# Patient Record
Sex: Male | Born: 1955 | Race: White | Hispanic: No | Marital: Married | Smoking: Former smoker
Health system: Southern US, Community
[De-identification: ages and names within clinical notes are randomized; demographics above are authoritative.]

## PROBLEM LIST (undated history)

## (undated) DIAGNOSIS — K602 Anal fissure, unspecified: Secondary | ICD-10-CM

## (undated) DIAGNOSIS — T7840XA Allergy, unspecified, initial encounter: Secondary | ICD-10-CM

## (undated) DIAGNOSIS — J42 Unspecified chronic bronchitis: Secondary | ICD-10-CM

## (undated) DIAGNOSIS — Z9289 Personal history of other medical treatment: Secondary | ICD-10-CM

## (undated) DIAGNOSIS — Z9989 Dependence on other enabling machines and devices: Secondary | ICD-10-CM

## (undated) DIAGNOSIS — I480 Paroxysmal atrial fibrillation: Secondary | ICD-10-CM

## (undated) DIAGNOSIS — IMO0002 Reserved for concepts with insufficient information to code with codable children: Secondary | ICD-10-CM

## (undated) DIAGNOSIS — E785 Hyperlipidemia, unspecified: Secondary | ICD-10-CM

## (undated) DIAGNOSIS — E1140 Type 2 diabetes mellitus with diabetic neuropathy, unspecified: Secondary | ICD-10-CM

## (undated) DIAGNOSIS — K648 Other hemorrhoids: Secondary | ICD-10-CM

## (undated) DIAGNOSIS — E669 Obesity, unspecified: Secondary | ICD-10-CM

## (undated) DIAGNOSIS — I10 Essential (primary) hypertension: Secondary | ICD-10-CM

## (undated) DIAGNOSIS — R202 Paresthesia of skin: Secondary | ICD-10-CM

## (undated) DIAGNOSIS — G4730 Sleep apnea, unspecified: Secondary | ICD-10-CM

## (undated) DIAGNOSIS — I4892 Unspecified atrial flutter: Secondary | ICD-10-CM

## (undated) DIAGNOSIS — N529 Male erectile dysfunction, unspecified: Secondary | ICD-10-CM

## (undated) DIAGNOSIS — F17200 Nicotine dependence, unspecified, uncomplicated: Secondary | ICD-10-CM

## (undated) DIAGNOSIS — G4733 Obstructive sleep apnea (adult) (pediatric): Secondary | ICD-10-CM

## (undated) DIAGNOSIS — E1165 Type 2 diabetes mellitus with hyperglycemia: Secondary | ICD-10-CM

## (undated) DIAGNOSIS — I2510 Atherosclerotic heart disease of native coronary artery without angina pectoris: Secondary | ICD-10-CM

## (undated) HISTORY — DX: Obesity, unspecified: E66.9

## (undated) HISTORY — DX: Sleep apnea, unspecified: G47.30

## (undated) HISTORY — DX: Other hemorrhoids: K64.8

## (undated) HISTORY — DX: Type 2 diabetes mellitus with diabetic neuropathy, unspecified: E11.40

## (undated) HISTORY — DX: Allergy, unspecified, initial encounter: T78.40XA

## (undated) HISTORY — DX: Essential (primary) hypertension: I10

## (undated) HISTORY — PX: ANAL FISSURE REPAIR: SHX2312

## (undated) HISTORY — DX: Unspecified atrial flutter: I48.92

## (undated) HISTORY — DX: Reserved for concepts with insufficient information to code with codable children: IMO0002

## (undated) HISTORY — DX: Paroxysmal atrial fibrillation: I48.0

## (undated) HISTORY — DX: Hyperlipidemia, unspecified: E78.5

## (undated) HISTORY — DX: Anal fissure, unspecified: K60.2

## (undated) HISTORY — DX: Type 2 diabetes mellitus with hyperglycemia: E11.65

## (undated) HISTORY — DX: Male erectile dysfunction, unspecified: N52.9

## (undated) HISTORY — DX: Nicotine dependence, unspecified, uncomplicated: F17.200

## (undated) HISTORY — DX: Personal history of other medical treatment: Z92.89

## (undated) HISTORY — DX: Paresthesia of skin: R20.2

---
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
## 1977-08-24 HISTORY — PX: TONSILLECTOMY AND ADENOIDECTOMY: SUR1326

## 1993-08-24 HISTORY — PX: CORONARY ANGIOPLASTY WITH STENT PLACEMENT: SHX49

## 1994-08-24 HISTORY — PX: CORONARY ANGIOPLASTY WITH STENT PLACEMENT: SHX49

## 2000-10-23 ENCOUNTER — Emergency Department (HOSPITAL_COMMUNITY): Admission: EM | Admit: 2000-10-23 | Discharge: 2000-10-23 | Payer: Self-pay | Admitting: Emergency Medicine

## 2000-11-04 ENCOUNTER — Encounter (HOSPITAL_BASED_OUTPATIENT_CLINIC_OR_DEPARTMENT_OTHER): Payer: Self-pay | Admitting: General Surgery

## 2000-11-04 ENCOUNTER — Encounter: Admission: RE | Admit: 2000-11-04 | Discharge: 2000-11-04 | Payer: Self-pay | Admitting: General Surgery

## 2000-11-18 ENCOUNTER — Encounter: Payer: Self-pay | Admitting: Family Medicine

## 2000-11-18 ENCOUNTER — Encounter: Admission: RE | Admit: 2000-11-18 | Discharge: 2000-11-18 | Payer: Self-pay | Admitting: Family Medicine

## 2002-09-20 ENCOUNTER — Ambulatory Visit (HOSPITAL_BASED_OUTPATIENT_CLINIC_OR_DEPARTMENT_OTHER): Admission: RE | Admit: 2002-09-20 | Discharge: 2002-09-20 | Payer: Self-pay | Admitting: *Deleted

## 2003-12-20 ENCOUNTER — Inpatient Hospital Stay (HOSPITAL_BASED_OUTPATIENT_CLINIC_OR_DEPARTMENT_OTHER): Admission: RE | Admit: 2003-12-20 | Discharge: 2003-12-20 | Payer: Self-pay | Admitting: Interventional Cardiology

## 2004-05-22 ENCOUNTER — Ambulatory Visit (HOSPITAL_BASED_OUTPATIENT_CLINIC_OR_DEPARTMENT_OTHER): Admission: RE | Admit: 2004-05-22 | Discharge: 2004-05-22 | Payer: Self-pay | Admitting: Family Medicine

## 2005-04-10 ENCOUNTER — Ambulatory Visit (HOSPITAL_COMMUNITY): Admission: RE | Admit: 2005-04-10 | Discharge: 2005-04-10 | Payer: Self-pay | Admitting: Otolaryngology

## 2005-08-24 HISTORY — PX: CARDIAC CATHETERIZATION: SHX172

## 2006-01-30 ENCOUNTER — Inpatient Hospital Stay (HOSPITAL_COMMUNITY): Admission: EM | Admit: 2006-01-30 | Discharge: 2006-02-01 | Payer: Self-pay | Admitting: Emergency Medicine

## 2007-05-22 ENCOUNTER — Ambulatory Visit (HOSPITAL_BASED_OUTPATIENT_CLINIC_OR_DEPARTMENT_OTHER): Admission: RE | Admit: 2007-05-22 | Discharge: 2007-05-22 | Payer: Self-pay | Admitting: Family Medicine

## 2007-05-24 ENCOUNTER — Ambulatory Visit: Payer: Self-pay | Admitting: Internal Medicine

## 2007-10-13 ENCOUNTER — Encounter (INDEPENDENT_AMBULATORY_CARE_PROVIDER_SITE_OTHER): Payer: Self-pay | Admitting: General Surgery

## 2007-10-13 ENCOUNTER — Ambulatory Visit (HOSPITAL_BASED_OUTPATIENT_CLINIC_OR_DEPARTMENT_OTHER): Admission: RE | Admit: 2007-10-13 | Discharge: 2007-10-13 | Payer: Self-pay | Admitting: General Surgery

## 2008-11-01 ENCOUNTER — Ambulatory Visit: Payer: Self-pay | Admitting: Family Medicine

## 2008-11-01 DIAGNOSIS — T887XXA Unspecified adverse effect of drug or medicament, initial encounter: Secondary | ICD-10-CM | POA: Insufficient documentation

## 2008-11-01 DIAGNOSIS — E785 Hyperlipidemia, unspecified: Secondary | ICD-10-CM | POA: Insufficient documentation

## 2008-11-01 LAB — CONVERTED CEMR LAB
Bilirubin Urine: NEGATIVE
Blood in Urine, dipstick: NEGATIVE
Glucose, Urine, Semiquant: NEGATIVE
Ketones, urine, test strip: NEGATIVE
Nitrite: NEGATIVE
Protein, U semiquant: NEGATIVE
Specific Gravity, Urine: 1.02
Urobilinogen, UA: 0.2
WBC Urine, dipstick: NEGATIVE
pH: 7

## 2008-11-05 LAB — CONVERTED CEMR LAB
ALT: 31 units/L (ref 0–53)
AST: 21 units/L (ref 0–37)
Albumin: 3.8 g/dL (ref 3.5–5.2)
Alkaline Phosphatase: 82 units/L (ref 39–117)
BUN: 10 mg/dL (ref 6–23)
Basophils Absolute: 0 10*3/uL (ref 0.0–0.1)
Basophils Relative: 0.4 % (ref 0.0–3.0)
Bilirubin, Direct: 0.1 mg/dL (ref 0.0–0.3)
CO2: 28 meq/L (ref 19–32)
Calcium: 8.7 mg/dL (ref 8.4–10.5)
Chloride: 107 meq/L (ref 96–112)
Cholesterol: 114 mg/dL (ref 0–200)
Creatinine, Ser: 0.9 mg/dL (ref 0.4–1.5)
Eosinophils Absolute: 0.2 10*3/uL (ref 0.0–0.7)
Eosinophils Relative: 2.7 % (ref 0.0–5.0)
GFR calc Af Amer: 114 mL/min
GFR calc non Af Amer: 95 mL/min
Glucose, Bld: 95 mg/dL (ref 70–99)
HCT: 44.2 % (ref 39.0–52.0)
HDL: 28.2 mg/dL — ABNORMAL LOW (ref >39.0)
Hemoglobin: 15.4 g/dL (ref 13.0–17.0)
LDL Cholesterol: 46 mg/dL (ref 0–99)
Lymphocytes Relative: 25.7 % (ref 12.0–46.0)
MCHC: 34.9 g/dL (ref 30.0–36.0)
MCV: 90.6 fL (ref 78.0–100.0)
Monocytes Absolute: 0.8 10*3/uL (ref 0.1–1.0)
Monocytes Relative: 9.7 % (ref 3.0–12.0)
Neutro Abs: 5.5 10*3/uL (ref 1.4–7.7)
Neutrophils Relative %: 61.5 % (ref 43.0–77.0)
PSA: 0.36 ng/mL (ref 0.10–4.00)
Platelets: 217 10*3/uL (ref 150–400)
Potassium: 4 meq/L (ref 3.5–5.1)
RBC: 4.88 M/uL (ref 4.22–5.81)
RDW: 12.1 % (ref 11.5–14.6)
Sodium: 141 meq/L (ref 135–145)
TSH: 1 microintl units/mL (ref 0.35–5.50)
Total Bilirubin: 0.9 mg/dL (ref 0.3–1.2)
Total CHOL/HDL Ratio: 4
Total Protein: 6.6 g/dL (ref 6.0–8.3)
Triglycerides: 198 mg/dL — ABNORMAL HIGH (ref 0–149)
VLDL: 40 mg/dL (ref 0–40)
WBC: 8.7 10*3/uL (ref 4.5–10.5)

## 2008-11-08 ENCOUNTER — Ambulatory Visit: Payer: Self-pay | Admitting: Family Medicine

## 2008-11-08 DIAGNOSIS — G4733 Obstructive sleep apnea (adult) (pediatric): Secondary | ICD-10-CM | POA: Insufficient documentation

## 2008-11-08 DIAGNOSIS — J45909 Unspecified asthma, uncomplicated: Secondary | ICD-10-CM | POA: Insufficient documentation

## 2008-11-08 DIAGNOSIS — J309 Allergic rhinitis, unspecified: Secondary | ICD-10-CM | POA: Insufficient documentation

## 2009-03-08 ENCOUNTER — Ambulatory Visit: Payer: Self-pay | Admitting: Family Medicine

## 2009-03-08 DIAGNOSIS — J019 Acute sinusitis, unspecified: Secondary | ICD-10-CM | POA: Insufficient documentation

## 2009-03-09 ENCOUNTER — Emergency Department (HOSPITAL_COMMUNITY): Admission: EM | Admit: 2009-03-09 | Discharge: 2009-03-09 | Payer: Self-pay | Admitting: Emergency Medicine

## 2009-03-18 ENCOUNTER — Ambulatory Visit: Payer: Self-pay | Admitting: Family Medicine

## 2009-03-18 DIAGNOSIS — B370 Candidal stomatitis: Secondary | ICD-10-CM | POA: Insufficient documentation

## 2009-05-13 ENCOUNTER — Inpatient Hospital Stay (HOSPITAL_COMMUNITY): Admission: EM | Admit: 2009-05-13 | Discharge: 2009-05-14 | Payer: Self-pay | Admitting: Emergency Medicine

## 2009-08-13 ENCOUNTER — Ambulatory Visit: Payer: Self-pay | Admitting: Family Medicine

## 2009-09-02 ENCOUNTER — Ambulatory Visit: Payer: Self-pay | Admitting: Family Medicine

## 2009-09-02 DIAGNOSIS — L909 Atrophic disorder of skin, unspecified: Secondary | ICD-10-CM | POA: Insufficient documentation

## 2009-09-02 DIAGNOSIS — L919 Hypertrophic disorder of the skin, unspecified: Secondary | ICD-10-CM

## 2009-09-09 ENCOUNTER — Telehealth: Payer: Self-pay | Admitting: Family Medicine

## 2009-09-10 ENCOUNTER — Ambulatory Visit: Payer: Self-pay | Admitting: Family Medicine

## 2009-09-16 ENCOUNTER — Ambulatory Visit: Payer: Self-pay | Admitting: Family Medicine

## 2009-09-27 ENCOUNTER — Ambulatory Visit: Payer: Self-pay | Admitting: Family Medicine

## 2009-10-07 ENCOUNTER — Ambulatory Visit: Payer: Self-pay | Admitting: Family Medicine

## 2009-11-01 ENCOUNTER — Ambulatory Visit: Payer: Self-pay | Admitting: Family Medicine

## 2009-11-01 DIAGNOSIS — R509 Fever, unspecified: Secondary | ICD-10-CM | POA: Insufficient documentation

## 2009-11-02 LAB — CONVERTED CEMR LAB
Basophils Absolute: 0 10*3/uL (ref 0.0–0.1)
Basophils Relative: 0 % (ref 0–1)
Eosinophils Absolute: 0.2 10*3/uL (ref 0.0–0.7)
Eosinophils Relative: 2 % (ref 0–5)
HCT: 46.4 % (ref 39.0–52.0)
Hemoglobin: 16.2 g/dL (ref 13.0–17.0)
Lymphocytes Relative: 5 % — ABNORMAL LOW (ref 12–46)
Lymphs Abs: 0.5 10*3/uL — ABNORMAL LOW (ref 0.7–4.0)
MCHC: 34.9 g/dL (ref 30.0–36.0)
MCV: 87.4 fL (ref 78.0–100.0)
Monocytes Absolute: 0.8 10*3/uL (ref 0.1–1.0)
Monocytes Relative: 8 % (ref 3–12)
Neutro Abs: 7.8 10*3/uL — ABNORMAL HIGH (ref 1.7–7.7)
Neutrophils Relative %: 84 % — ABNORMAL HIGH (ref 43–77)
Platelets: 186 10*3/uL (ref 150–400)
RBC: 5.31 M/uL (ref 4.22–5.81)
RDW: 12.8 % (ref 11.5–15.5)
WBC: 9.3 10*3/uL (ref 4.0–10.5)

## 2009-11-06 ENCOUNTER — Ambulatory Visit: Payer: Self-pay | Admitting: Family Medicine

## 2009-11-06 DIAGNOSIS — R197 Diarrhea, unspecified: Secondary | ICD-10-CM | POA: Insufficient documentation

## 2009-11-08 ENCOUNTER — Telehealth: Payer: Self-pay | Admitting: Family Medicine

## 2010-04-17 ENCOUNTER — Ambulatory Visit: Payer: Self-pay | Admitting: Family Medicine

## 2010-08-01 ENCOUNTER — Ambulatory Visit: Payer: Self-pay | Admitting: Family Medicine

## 2010-09-14 ENCOUNTER — Encounter: Payer: Self-pay | Admitting: Gastroenterology

## 2010-09-23 NOTE — Assessment & Plan Note (Signed)
Summary: ? sinuses/ccm   Vital Signs:  Patient profile:   55 year old male Weight:      288 pounds Temp:     98.3 degrees F oral Pulse rate:   98 / minute BP sitting:   122 / 82  (left arm) Cuff size:   large  Vitals Entered By: Army Fossa CMA (March 08, 2009 3:47 PM)  History of Present Illness: Patient seen with one week history of pain right frontal sinus region radiating behind the eyes. Had headaches. No fever. Mild nasal congestion occasional nonproductive cough. Advil helps headaches temporarily. He has not had any blurred vision. He has history of seasonal and perennial allergies and takes Zyrtec. Allergic to cephalosporins and penicillin.  Allergies (verified): 1)  ! Codeine 2)  ! Cephalosporins 3)  ! * Cillin Drugs  Past History:  Past Medical History: Last updated: 11/08/2008 Allergic rhinitis Asthma Coronary artery disease Hyperlipidemia  Review of Systems      See HPI  Physical Exam  General:  Well-developed,well-nourished,in no acute distress; alert,appropriate and cooperative throughout examination Head:  Normocephalic and atraumatic without obvious abnormalities. No apparent alopecia or balding.patient has some mild tenderness over the right frontal sinus region Eyes:  No corneal or conjunctival inflammation noted. EOMI. Perrla. Funduscopic exam benign, without hemorrhages, exudates or papilledema. Vision grossly normal. Ears:  External ear exam shows no significant lesions or deformities.  Otoscopic examination reveals clear canals, tympanic membranes are intact bilaterally without bulging, retraction, inflammation or discharge. Hearing is grossly normal bilaterally. Nose:  mild mucosal erythema but no purulent nasal discharge noted. Mouth:  Oral mucosa and oropharynx without lesions or exudates.  Teeth in good repair. Neck:  No deformities, masses, or tenderness noted. Lungs:  Normal respiratory effort, chest expands symmetrically. Lungs are clear to  auscultation, no crackles or wheezes.   Impression & Recommendations:  Problem # 1:  ACUTE SINUSITIS, UNSPECIFIED (ICD-461.9)  His updated medication list for this problem includes:    Levaquin 500 Mg Tabs (Levofloxacin) ..... One by mouth once daily for 10 days  Complete Medication List: 1)  Advair Diskus 100-50 Mcg/dose Misc (Fluticasone-salmeterol) .[name].. 1 puff q 12 hours 2)  Zyrtec Allergy 10 Mg Tabs (Cetirizine hcl) .... Once daily 3)  Levaquin 500 Mg Tabs (Levofloxacin) .... One by mouth once daily for 10 days  Patient Instructions: 1)  Drink plenty of fluids. Be in touch next week if symptoms not improving. Prescriptions: LEVAQUIN 500 MG TABS (LEVOFLOXACIN) one by mouth once daily for 10 days  #10 x 0   Entered and Authorized by:   Evelena Peat MD   Signed by:   Evelena Peat MD on 03/08/2009   Method used:   Electronically to        [facility]* (retail)       [address] [place], [place]  [address]       Ph: [phone] or [phone]       Fax: [phone]   RxID:   [id number]

## 2010-09-23 NOTE — Assessment & Plan Note (Signed)
Summary: FUP//CCM   Vital Signs:  Patient profile:   55 year old male Temp:     99.2 degrees F oral BP sitting:   120 / 88  (left arm) Cuff size:   large  Vitals Entered By: Sid Falcon LPN (October 07, 2009 4:35 PM) CC: follow-up visit   History of Present Illness: Here for reassessment multiple skin tag. Right groin region improved. Has several left groin which would like to now have treated. These are bothersome because of location.  Sinus sxs improved.  Allergies: 1)  ! Codeine 2)  ! Cephalosporins 3)  ! * Cillin Drugs  Past History:  Past Medical History: Last updated: 11/08/2008 Allergic rhinitis Asthma Coronary artery disease Hyperlipidemia PMH reviewed for relevance  Review of Systems      See HPI  Physical Exam  General:  Well-developed,well-nourished,in no acute distress; alert,appropriate and cooperative throughout examination Skin:  multiple skin tags left groin region. A few scattered skin tags right groin which is not fully gone away after previous treatment   Impression & Recommendations:  Problem # 1:  SKIN TAG (ICD-701.9)  reviewed risk and benefits of treatment with cryotherapy and patient wished to proceed. He had numerous skin tags left groin region numbering over 30 and 5 right groin which were retreated. Tolerated treatment well  Orders: Cryotherapy/Destruction benign or premalignant lesion (1st lesion)  (17000) Cryotherapy/Destruction benign or premalignant lesion (2nd-14th lesions) (17003)  Complete Medication List: 1)  Advair Diskus 100-50 Mcg/dose Misc (Fluticasone-salmeterol) .[name].. 1 puff q 12 hours 2)  Zyrtec Allergy 10 Mg Tabs (Cetirizine hcl) .... Once daily as needed 3)  Lipitor 40 Mg Tabs (Atorvastatin calcium) .... Once daily 4)  Aspirin 325 Mg Tabs (Aspirin) .... Once daily

## 2010-09-23 NOTE — Progress Notes (Signed)
Summary: flagyl  Phone Note Call from Patient Call back at Work Phone [phone]   Reason for Call: Talk to Doctor Summary of Call: Re med he has me on & whether I should continue to take it.  Flagyl, since C diff was not positive.  Not well, still have nausea & have diarrhea.  No problem taking it & it wipe something else out.  Eating yogurt daily.  Please call with response about Flagyl. Initial call taken by: Rudy Jew, RN,  November 08, 2009 9:13 AM  Follow-up for Phone Call        If no side effects would continue Flagyl until complete. Follow-up by: Evelena Peat MD,  November 08, 2009 10:38 AM  Additional Follow-up for Phone Call Additional follow up Details #1::        Phone Call Completed Additional Follow-up by: Rudy Jew, RN,  November 08, 2009 11:33 AM

## 2010-09-23 NOTE — Assessment & Plan Note (Signed)
Summary: skin tag treated/njr   Vital Signs:  Patient profile:   55 year old male O2 Sat:      97 % on Room air Temp:     98 degrees F oral Pulse rate:   88 / minute Pulse rhythm:   regular BP sitting:   140 / 86  (left arm) Cuff size:   large  Vitals Entered By: Sid Falcon LPN (September 16, 2009 3:29 PM)  O2 Flow:  Room air CC: skin tag treatment   History of Present Illness: Patient here to have more skin tag treated right inguinal region. These are bothersome with ambulation.  multiple skin tags axillary region recently treated with liquid N and these are resolving.  Sinusitis sxs are slightly improved on Levaquin.  No fever.  Still has some nasal congestion.  Allergies: 1)  ! Codeine 2)  ! Cephalosporins 3)  ! 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  Otoscopic examination reveals clear canals, tympanic membranes are intact bilaterally without bulging, retraction, inflammation or discharge. Hearing is grossly normal bilaterally. Nose:  External nasal examination shows no deformity or inflammation. Nasal mucosa are pink and moist without lesions or exudates. Mouth:  Oral mucosa and oropharynx without lesions or exudates.  Teeth in good repair. Skin:  patient has very large number of small to medium size skin tags in groin region right and left side.  he has approximately 35 on each side. None of these has any atypical features.   Impression & Recommendations:  Problem # 1:  SKIN TAG  (ICD-701.9)  again reviewed risk and benefits of treatment and patient wished to proceed with treatment with liquid nitrogen. We elected to do one side only given the very large number of skin tags. He requested treatment not because of cosmetics but caused irritation with walking.  Will return in one week and consider treatment of other side then.  Orders: Cryotherapy/Destruction benign or premalignant lesion (1st lesion)  (17000) Cryotherapy/Destruction benign or premalignant lesion (2nd-14th lesions) (17003)  Complete Medication List: 1)  Advair Diskus 100-50 Mcg/dose Misc (Fluticasone-salmeterol) .[name].. 1 puff q 12 hours 2)  Zyrtec Allergy 10 Mg Tabs (Cetirizine hcl) .... Once daily as needed 3)  Lipitor 40 Mg Tabs (Atorvastatin calcium) .... Once daily 4)  Aspirin 325 Mg Tabs (Aspirin) .... Once daily 5)  Hydrocodone-homatropine 5-1.5 Mg/76ml Syrp (Hydrocodone-homatropine) .... One tsp by mouth q 4-6 hours as needed cough 6)  Levaquin 500 Mg Tabs (Levofloxacin) .... One by mouth once daily for 10 days  Patient Instructions: 1)  Schedule follow up in one to two weeks.

## 2010-09-23 NOTE — Assessment & Plan Note (Signed)
Summary: skin tags removal/njr   Vital Signs:  Patient profile:   55 year old male Temp:     98.9 degrees F oral BP sitting:   110 / 84  (left arm) Cuff size:   large  Vitals Entered By: Sid Falcon LPN (September 02, 2009 3:12 PM) CC: Skin tag removal, sinus sx, cough   History of Present Illness: Multiple skin tags axilla and groin region bilaterally.  These get irritated sec to location and he would like to have treated.  Previously several removed by dermatologist but had signif bleeding afterwards.  Pos family hx of skin tags.    Separate issue is persitent sinus congestion and pressure and some bloody nasal discharge past couple days. Had some colored nasal discharge as well and cough productive of yellow sputum. Has tried over-the-counter medications including Mucinex and Nyquil without much relief.  no fever. Hx smoking.  Allergies: 1)  ! Codeine 2)  ! Cephalosporins 3)  ! * Cillin Drugs  Past History:  Past Medical History: Last updated: 11/08/2008 Allergic rhinitis Asthma Coronary artery disease Hyperlipidemia  Review of Systems  The patient denies fever, hoarseness, prolonged cough, headaches, and hemoptysis.    Physical Exam  General:  Well-developed,well-nourished,in no acute distress; alert,appropriate and cooperative throughout examination Ears:  External ear exam shows no significant lesions or deformities.  Otoscopic examination reveals clear canals, tympanic membranes are intact bilaterally without bulging, retraction, inflammation or discharge. Hearing is grossly normal bilaterally. Nose:  External nasal examination shows no deformity or inflammation. Nasal mucosa are pink and moist without lesions or exudates. Mouth:  Oral mucosa and oropharynx without lesions or exudates.  Teeth in good repair. Neck:  No deformities, masses, or tenderness noted. Lungs:  Normal respiratory effort, chest expands symmetrically. Lungs are clear to auscultation, no crackles  or wheezes. Heart:  Normal rate and regular rhythm. S1 and S2 normal without gallop, murmur, click, rub or other extra sounds. Skin:  patient has multiple skin tags both axilla.  We treated total of 25 R axilla and 22 L axilla.   Impression & Recommendations:  Problem # 1:  SKIN TAG (ICD-701.9) Discussed risks of treatment options including excision and cryotherapy.  Given that he has approx 50 skin tags we explained excision would be difficult for that many lesions.  Pt consented to treatment with cryotherapy.  Tolerated well.  He will consider f/u in 2-3 weeks to treat additional groin lesions then. Orders: Cryotherapy/Destruction benign or premalignant lesion (1st lesion)  (17000) Cryotherapy/Destruction benign or premalignant lesion (2nd-14th lesions) (17003)  Problem # 2:  ACUTE SINUSITIS, UNSPECIFIED (ICD-461.9)  The following medications were removed from the medication list:    Levaquin 500 Mg Tabs (Levofloxacin) ..... One by mouth once daily for 10 days His updated medication list for this problem includes:    Azithromycin 250 Mg Tabs (Azithromycin) .[name]... 2 by mouth today then one by mouth once daily for 4 days    Hydrocodone-homatropine 5-1.5 Mg/50ml Syrp (Hydrocodone-homatropine) ..... One tsp by mouth q 4-6 hours as needed cough  Complete Medication List: 1)  Advair Diskus 100-50 Mcg/dose Misc (Fluticasone-salmeterol) .[name].. 1 puff q 12 hours 2)  Zyrtec Allergy 10 Mg Tabs (Cetirizine hcl) .... Once daily as needed 3)  Lipitor 40 Mg Tabs (Atorvastatin calcium) .... Once daily 4)  Aspirin 325 Mg Tabs (Aspirin) .... Once daily 5)  Azithromycin 250 Mg Tabs (Azithromycin) .... 2 by mouth today then one by mouth once daily for 4 days 6)  Hydrocodone-homatropine 5-1.5  Mg/1ml Syrp (Hydrocodone-homatropine) .... One tsp by mouth q 4-6 hours as needed cough  Patient Instructions: 1)  Keep skin tags clean with soap and water. 2)  Return your convenience within the next several weeks to  have further skin tags treated. Prescriptions: HYDROCODONE-HOMATROPINE 5-1.5 MG/5ML SYRP (HYDROCODONE-HOMATROPINE) one tsp by mouth q 4-6 hours as needed cough  #120 ml x 0   Entered and Authorized by:   Evelena Peat MD   Signed by:   Evelena Peat MD on 09/02/2009   Method used:   Print then Give to Patient   RxID:   [id number] AZITHROMYCIN 250 MG TABS (AZITHROMYCIN) 2 by mouth today then one by mouth once daily for 4 days  #6 x 0   Entered and Authorized by:   Evelena Peat MD   Signed by:   Evelena Peat MD on 09/02/2009   Method used:   Print then Give to Patient   RxID:   [id number]

## 2010-09-23 NOTE — Assessment & Plan Note (Signed)
Summary: ?bronchitus/?low grade fever/cjr   Vital Signs:  Patient profile:   55 year old male Temp:     98.6 degrees F oral BP sitting:   130 / 84  (left arm) Cuff size:   large  Vitals Entered By: Sid Falcon LPN (April 17, 2010 4:16 PM) CC: Not sleeping due to coughing   History of Present Illness: Same-day appointment. One-week history of cough productive of brown to green sputum. Some sinus pressure as well. Temperature 99.6 last night. Obstructive sleep apnea history. Uses CPAP. Difficulty sleeping past several  nightes. Taking an over-the-counter cough medication and Mucinex DM without much improvement. Very little sleep last few nights. Nonsmoker.  Allergies: 1)  ! Codeine 2)  ! Cephalosporins 3)  ! * Cillin Drugs  Past History:  Past Medical History: Last updated: 11/08/2008 Allergic rhinitis Asthma Coronary artery disease Hyperlipidemia  Physical Exam  General:  Well-developed,well-nourished,in no acute distress; alert,appropriate and cooperative throughout examination Ears:  External ear exam shows no significant lesions or deformities.  Otoscopic examination reveals clear canals, tympanic membranes are intact bilaterally without bulging, retraction, inflammation or discharge. Hearing is grossly normal bilaterally. Mouth:  Oral mucosa and oropharynx without lesions or exudates.  Teeth in good repair. Neck:  No deformities, masses, or tenderness noted. Lungs:  Normal respiratory effort, chest expands symmetrically. Lungs are clear to auscultation, no crackles or wheezes. Heart:  Normal rate and regular rhythm. S1 and S2 normal without gallop, murmur, click, rub or other extra sounds.   Impression & Recommendations:  Problem # 1:  ACUTE BRONCHITIS (ICD-466.0)  The following medications were removed from the medication list:    Metronidazole 500 Mg Tabs (Metronidazole) ..... One by mouth three times a day for 10 days His updated medication list for this  problem includes:    Advair Diskus 100-50 Mcg/dose Misc (Fluticasone-salmeterol) .[name]... 1 puff q 12 hours    Tussionex Pennkinetic Er 8-10 Mg/37ml Lqcr (Chlorpheniramine-hydrocodone) ..... One tsp by mouth q 12 hours as needed cough    Azithromycin 250 Mg Tabs (Azithromycin) .[name]... 2 by mouth today then one by mouth once daily for 4 days  Complete Medication List: 1)  Advair Diskus 100-50 Mcg/dose Misc (Fluticasone-salmeterol) .[name].. 1 puff q 12 hours 2)  Zyrtec Allergy 10 Mg Tabs (Cetirizine hcl) .... Once daily as needed 3)  Lipitor 40 Mg Tabs (Atorvastatin calcium) .... Once daily 4)  Aspirin 325 Mg Tabs (Aspirin) .... Once daily 5)  Tussionex Pennkinetic Er 8-10 Mg/73ml Lqcr (Chlorpheniramine-hydrocodone) .... One tsp by mouth q 12 hours as needed cough 6)  Azithromycin 250 Mg Tabs (Azithromycin) .... 2 by mouth today then one by mouth once daily for 4 days  Patient Instructions: 1)  Acute Bronchitis symptoms for less then 10 days are not  helped by antibiotics. Take over the counter cough medications. Call if no improvement in 5-7 days, sooner if increasing cough, fever, or new symptoms ( shortness of breath, chest pain) .  Prescriptions: AZITHROMYCIN 250 MG TABS (AZITHROMYCIN) 2 by mouth today then one by mouth once daily for 4 days  #6 x 0   Entered and Authorized by:   Evelena Peat MD   Signed by:   Evelena Peat MD on 04/17/2010   Method used:   Print then Give to Patient   RxID:   [id number] TUSSIONEX PENNKINETIC ER 8-10 MG/5ML LQCR (CHLORPHENIRAMINE-HYDROCODONE) one tsp by mouth q 12 hours as needed cough  #90 ml x 0   Entered and Authorized by:  Evelena Peat MD   Signed by:   Evelena Peat MD on 04/17/2010   Method used:   Print then Give to Patient   RxID:   [id number]

## 2010-09-23 NOTE — Progress Notes (Signed)
Summary: ov for irritated skin tags are & cough & cold  Phone Note Call from Patient Call back at Work Phone [phone]   Caller: vm Call For: Andrea Ferrer Summary of Call: 1)Skin tags/frozen areas irritated2)Cough & cold not better.  Took antibiotics.  Request workin ov today.   Initial call taken by: Rudy Jew, RN,  September 09, 2009 2:31 PM  Follow-up for Phone Call        9:45 tomorrow okay with patient. Follow-up by: Rudy Jew, RN,  September 09, 2009 2:34 PM

## 2010-09-23 NOTE — Assessment & Plan Note (Signed)
Summary: cpx/jls   Vital Signs:  Patient profile:   55 year old male Height:      73 inches Weight:      286 pounds BMI:     37.87 Temp:     97.8 degrees F oral BP sitting:   140 / 90  (left arm) Cuff size:   regular  Vitals Entered By: Sid Falcon LPN (November 08, 2008 3:18 PM) CC: Previous [place] pt, CPX   History of Present Illness: Patient is seen today for complete physical and to establish care. He has chronic medical problems including history of coronary artery disease. He had cardiac stent back in age 78 has done remarkably well since that time.  Sees a cardiologist in town had an exercise tolerance test about a year ago which was normal.  Has not had any recent chest pains. His medications include aspirin, Fish oil, and Lipitor 40 mg daily.  Patient also has history of seasonal allergies and asthma. His asthma tends to flare up more in the spring. He has previously used Advair with good success and he would like to get back on that. He uses over-the-counter antihistamines such as Zyrtec which usually help.  He's had some weight gain this past winter. He attributes this to decrease activity and overeating. He has obstructive sleep apnea and uses CPAP. He generally feels well rested after using his CPAP.  Preventive Screening-Counseling & Management     Smoking Status: current     Smoking Cessation Counseling: YES     Does Patient Exercise: no  Past History:  Past Medical History:    Allergic rhinitis    Asthma    Coronary artery disease    Hyperlipidemia  Family History:    Family History High cholesterol    Family History of Cardiovascular disorder  Social History:    Occupation:  Public house manager    Married    Current Smoker    Alcohol use-no    Regular exercise-no    Occupation:  employed    Smoking Status:  current    Does Patient Exercise:  no  Review of Systems       The patient complains of weight gain and dyspnea on exertion.  The patient  denies fever, hoarseness, chest pain, syncope, peripheral edema, prolonged cough, headaches, hemoptysis, abdominal pain, melena, hematochezia, severe indigestion/heartburn, difficulty walking, and depression.    Physical Exam  General:  alert pleasant moderately obese gentleman in no distress Head:  Normocephalic and atraumatic without obvious abnormalities. No apparent alopecia or balding. Eyes:  No corneal or conjunctival inflammation noted. EOMI. Perrla. Funduscopic exam benign, without hemorrhages, exudates or papilledema. Vision grossly normal. Ears:  External ear exam shows no significant lesions or deformities.  Otoscopic examination reveals clear canals, tympanic membranes are intact bilaterally without bulging, retraction, inflammation or discharge. Hearing is grossly normal bilaterally. Mouth:  Oral mucosa and oropharynx without lesions or exudates.  Teeth in good repair. Neck:  no masses noted Lungs:  chest clear to auscultation. No wheezes noted this time. No rales. Heart:  regular rhythm and rate no murmur Abdomen:  soft and nontender without mass Rectal:  deferred as he had the colonoscopy just within the past year Msk:  No deformity or scoliosis noted of thoracic or lumbar spine.   Pulses:  R and L carotid,radial,femoral,dorsalis pedis and posterior tibial pulses are full and equal bilaterally Extremities:  No clubbing, cyanosis, edema, or deformity noted with normal full range of motion of all joints.  Neurologic:  No cranial nerve deficits noted. Station and gait are normal. Plantar reflexes are down-going bilaterally. DTRs are symmetrical throughout. Sensory, motor and coordinative functions appear intact.   Impression & Recommendations:  Problem # 1:  ROUTINE GENERAL MEDICAL EXAM@HEALTH  CARE FACL (ICD-V70.0)  Orders: Tobacco use cessation intermediate 3-10 minutes (99406)  Problem # 2:  ASTHMA (ICD-493.90)  His updated medication list for this problem includes:     Advair Diskus 100-50 Mcg/dose Misc (Fluticasone-salmeterol) .[name]... 1 puff q 12 hours  Orders: Tobacco use cessation intermediate 3-10 minutes (99406)  Complete Medication List: 1)  Advair Diskus 100-50 Mcg/dose Misc (Fluticasone-salmeterol) .[name].. 1 puff q 12 hours  Patient Instructions: 1)  Tobacco is very bad for your health and your loved ones ! You should stop smoking !  2)  Stop smoking tips: Choose a quit date. Cut down before the quit date. Decide what you will do as a substitute when you feel the urge to smoke(gum, toothpick, exercise).  3)  It is important that you exercise reguarly at least 20 minutes 5 times a week. If you develop chest pain, have severe difficulty breathing, or feel very tired, stop exercising immediately and seek medical attention.  4)  You need to lose weight. Consider a lower calorie diet and regular exercise.  5)  The medication list was reviewed and reconciled.  All changed / newly prescribed medications were explained.  A complete medication list was provided to the patient / caregiver. Prescriptions: ADVAIR DISKUS 100-50 MCG/DOSE MISC (FLUTICASONE-SALMETEROL) 1 puff q 12 hours  #1 x 11   Entered and Authorized by:   Evelena Peat MD   Signed by:   Evelena Peat MD on 11/08/2008   Method used:   Electronically to        [facility]* (retail)       [address]       [place], [place]  [address]       Ph: [phone] or [phone]       Fax: [phone]   RxID:   [id number]

## 2010-09-23 NOTE — Assessment & Plan Note (Signed)
Summary: 1-2 week follow up/cjr   Vital Signs:  Patient profile:   55 year old male Temp:     98.5 degrees F oral BP sitting:   130 / 90  (left arm) Cuff size:   large  Vitals Entered By: Sid Falcon LPN (September 27, 2009 4:19 PM) CC: 1-2 week follow- up skin tags, sinus congestion   History of Present Illness: here for repeat treatment multiple skin tags right groin region. Overall skin tags significantly improved axillary region. He has some persistent skin tags right groin region. Had some mild irritation after last treatment.  these are debilitating in terms of limiting exercise and walking because of irritation with running. Request retreatment today Also having some mild postnasal drip symptoms and persistent sinus congestive symptoms. No purulent nasal discharge.  No fever, chills, or headache.  Allergies: 1)  ! Codeine 2)  ! Cephalosporins 3)  ! * Cillin Drugs  Past History:  Past Medical History: Last updated: 11/08/2008 Allergic rhinitis Asthma Coronary artery disease Hyperlipidemia PMH reviewed for relevance  Review of Systems      See HPI  Physical Exam  General:  Well-developed,well-nourished,in no acute distress; alert,appropriate and cooperative throughout examination Nose:  External nasal examination shows no deformity or inflammation. Nasal mucosa are pink and moist without lesions or exudates. Skin:  right groin region reveals multiple skin tags with fewer in number than last treatment. He has a few larger persistent skin tags. A couple of these have somewhat darkened surface. No signs of cellulitis or secondary infection   Impression & Recommendations:  Problem # 1:  SKIN TAG (ICD-701.9)  discussed risk and benefits of treatment and he wished to proceed with repeat cryotherapy. He had over 15 skin tags which were treated in this manner right groin region  Orders: Cryotherapy/Destruction benign or premalignant lesion (1st lesion)   (17000) Cryotherapy/Destruction benign or premalignant lesion (2nd-14th lesions) (17003)  Problem # 2:  ACUTE SINUSITIS, UNSPECIFIED (ICD-461.9) suggested Netti pot and samples of Veramyst.  consider limited CT of sinuses if sxs not improving with that. The following medications were removed from the medication list:    Levaquin 500 Mg Tabs (Levofloxacin) ..... One by mouth once daily for 10 days His updated medication list for this problem includes:    Hydrocodone-homatropine 5-1.5 Mg/70ml Syrp (Hydrocodone-homatropine) ..... One tsp by mouth q 4-6 hours as needed cough  Complete Medication List: 1)  Advair Diskus 100-50 Mcg/dose Misc (Fluticasone-salmeterol) .[name].. 1 puff q 12 hours 2)  Zyrtec Allergy 10 Mg Tabs (Cetirizine hcl) .... Once daily as needed 3)  Lipitor 40 Mg Tabs (Atorvastatin calcium) .... Once daily 4)  Aspirin 325 Mg Tabs (Aspirin) .... Once daily 5)  Hydrocodone-homatropine 5-1.5 Mg/63ml Syrp (Hydrocodone-homatropine) .... One tsp by mouth q 4-6 hours as needed cough  Patient Instructions: 1)  use Veramyst 2 sprays per nostril once daily. 2)  Consider use of Netti pot and saline nasal irrigation twice daily.

## 2010-09-23 NOTE — Assessment & Plan Note (Signed)
Summary: sinuses//ccm   Vital Signs:  Patient profile:   55 year old male Temp:     98.9 degrees F oral BP sitting:   102 / 82  (left arm) Cuff size:   regular  Vitals Entered By: Sid Falcon LPN (August 13, 2009 11:51 AM) CC: Sinus congestion X 1 week   History of Present Illness: Patient seen with 2 to three-day history of sinus congestion and bilateral ear pressure. Wife was sick with severe bronchitis last week. At this point he has no cough. No fever. No purulent nasal secretions. Has some mild malaise and body aches. Has little bit of puffiness and swelling around the eyes but no crusted discharge.  Allergies: 1)  ! Codeine 2)  ! Cephalosporins 3)  ! * Cillin Drugs  Past History:  Past Medical History: Last updated: 11/08/2008 Allergic rhinitis Asthma Coronary artery disease Hyperlipidemia  Review of Systems      See HPI  Physical Exam  General:  Well-developed,well-nourished,in no acute distress; alert,appropriate and cooperative throughout examination Eyes:  conjunctiva normal. No purulent discharge noted Ears:  External ear exam shows no significant lesions or deformities.  Otoscopic examination reveals clear canals, tympanic membranes are intact bilaterally without bulging, retraction, inflammation or discharge. Hearing is grossly normal bilaterally. Nose:  External nasal examination shows no deformity or inflammation. Nasal mucosa are pink and moist without lesions or exudates. Mouth:  Oral mucosa and oropharynx without lesions or exudates.  Teeth in good repair. Neck:  No deformities, masses, or tenderness noted. Lungs:  Normal respiratory effort, chest expands symmetrically. Lungs are clear to auscultation, no crackles or wheezes. Heart:  Normal rate and regular rhythm. S1 and S2 normal without gallop, murmur, click, rub or other extra sounds. Skin:  multiple skin tags axillary region. No rash. Cervical Nodes:  No lymphadenopathy noted   Impression &  Recommendations:  Problem # 1:  VIRAL URI (ICD-465.9) Treat symptomatically. Will try low-dose Sudafed and continue Mucinex. His updated medication list for this problem includes:    Zyrtec Allergy 10 Mg Tabs (Cetirizine hcl) ..... Once daily    Aspirin 325 Mg Tabs (Aspirin) ..... Once daily  Complete Medication List: 1)  Advair Diskus 100-50 Mcg/dose Misc (Fluticasone-salmeterol) .[name].. 1 puff q 12 hours 2)  Zyrtec Allergy 10 Mg Tabs (Cetirizine hcl) .... Once daily 3)  Levaquin 500 Mg Tabs (Levofloxacin) .... One by mouth once daily for 10 days 4)  Lipitor 40 Mg Tabs (Atorvastatin calcium) .... Once daily 5)  Aspirin 325 Mg Tabs (Aspirin) .... Once daily 6)  Fluconazole 100 Mg Tabs (Fluconazole) .... 2 tablets by mouth today then 1 by mouth once daily for 14 days.

## 2010-09-23 NOTE — Assessment & Plan Note (Signed)
Summary: FLU-LIKE SXS (N/D) // RS   Vital Signs:  Patient profile:   55 year old male Weight:      273 pounds Temp:     98.8 degrees F oral BP sitting:   130 / 90  (left arm) Cuff size:   large  Vitals Entered By: Sid Falcon LPN (November 06, 2009 9:30 AM)   History of Present Illness: Patient seen recently with febrile illness. Severe body aches, headaches, and fever. We suspected possible influenza and patient was treated with Tamiflu. His fever has resolved and overall he feels somewhat better but is having some diffuse abdominal cramping, nausea without vomiting and diarrhea with several nonbloody stools per day.  Recent history is that he had dental abscess and was on several days of clindamycin. We had entertained whether he could have C. difficile issue but his diarrhea was only minimal and one episode at prior visit. At this point diarrhea has increased in frequency.  Allergies: 1)  ! Codeine 2)  ! Cephalosporins 3)  ! * Cillin Drugs  Past History:  Past Medical History: Last updated: 11/08/2008 Allergic rhinitis Asthma Coronary artery disease Hyperlipidemia PMH reviewed for relevance  Review of Systems      See HPI  Physical Exam  General:  Well-developed,well-nourished,in no acute distress; alert,appropriate and cooperative throughout examination Head:  Normocephalic and atraumatic without obvious abnormalities. No apparent alopecia or balding. Ears:  External ear exam shows no significant lesions or deformities.  Otoscopic examination reveals clear canals, tympanic membranes are intact bilaterally without bulging, retraction, inflammation or discharge. Hearing is grossly normal bilaterally. Mouth:  no evidence for dental abscess. Oropharynx moist Neck:  No deformities, masses, or tenderness noted. Lungs:  Normal respiratory effort, chest expands symmetrically. Lungs are clear to auscultation, no crackles or wheezes. Heart:  Normal rate and regular rhythm. S1  and S2 normal without gallop, murmur, click, rub or other extra sounds. Abdomen:  soft, non-tender, normal bowel sounds, no distention, no masses, no hepatomegaly, and no splenomegaly.   Skin:  no rash   Impression & Recommendations:  Problem # 1:  DIARRHEA (ICD-787.91) Assessment New  rule out C. difficile colitis. Obtain appropriate stool studies and start metronidazole.  Orders: T-Culture, C-Diff Toxin A/B ([id number]) Specimen Handling (14782)  Complete Medication List: 1)  Advair Diskus 100-50 Mcg/dose Misc (Fluticasone-salmeterol) .[name].. 1 puff q 12 hours 2)  Zyrtec Allergy 10 Mg Tabs (Cetirizine hcl) .... Once daily as needed 3)  Lipitor 40 Mg Tabs (Atorvastatin calcium) .... Once daily 4)  Aspirin 325 Mg Tabs (Aspirin) .... Once daily 5)  Tamiflu 75 Mg Caps (Oseltamivir phosphate) .... One by mouth two times a day for 5 days 6)  Promethazine Hcl 25 Mg Tabs (Promethazine hcl) .... One by mouth q 6 hours as needed nausea 7)  Metronidazole 500 Mg Tabs (Metronidazole) .... One by mouth three times a day for 10 days  Patient Instructions: 1)  Eat more potassium rich foods such as bananas, oranje juice, and salt substitutes .  2)  drink plenty of fluids. 3)  Follow up promptly if you notice any worsening diarrhea, vomiting, or worsening abdominal pain. Prescriptions: METRONIDAZOLE 500 MG TABS (METRONIDAZOLE) one by mouth three times a day for 10 days  #30 x 1   Entered and Authorized by:   Evelena Peat MD   Signed by:   Evelena Peat MD on 11/06/2009   Method used:   Electronically to        [facility]* (retail)  [address]       [place], [place]  [address]       Ph: [phone] or [phone]       Fax: [phone]   RxID:   [id number]

## 2010-09-23 NOTE — Assessment & Plan Note (Signed)
Summary: SKIN TAG REMOVAL AREA IRRITATED/COUGH COLD NO BETTER/PS   Vital Signs:  Patient profile:   55 year old male Temp:     98.4 degrees F oral BP sitting:   140 / 92  (left arm) Cuff size:   large  Vitals Entered By: Sid Falcon LPN (September 10, 2009 10:13 AM) CC: Skin tag irritation, ongoing cough, sinus issues   History of Present Illness: Patient here with persistent respiratory symptoms. Refer to previous note. Treated with Zithromax and hydrocodone cough syrup. Now has progressive sinus pressure pain mostly frontal and ethmoid sinus region. Occasional yellowish nasal discharge. Cough mostly nonproductive. Also had low-grade fever of 100 last night. Completed course of Zithromax.  Skin tags have been somewhat irritated from previous treatment and most appear to be resolving with liquid nitrogen treatment.  Allergies: 1)  ! Codeine 2)  ! Cephalosporins 3)  ! * Cillin Drugs  Past History:  Past Medical History: Last updated: 11/08/2008 Allergic rhinitis Asthma Coronary artery disease Hyperlipidemia  Review of Systems      See HPI  Physical Exam  General:  Well-developed,well-nourished,in no acute distress; alert,appropriate and cooperative throughout examination Ears:  External ear exam shows no significant lesions or deformities.  Otoscopic examination reveals clear canals, tympanic membranes are intact bilaterally without bulging, retraction, inflammation or discharge. Hearing is grossly normal bilaterally. Nose:  patient hasn't yellowish mucus left naris and some clear mucus right naris. Mild erythema. Mouth:  Oral mucosa and oropharynx without lesions or exudates.  Teeth in good repair. Neck:  No deformities, masses, or tenderness noted. Lungs:  Normal respiratory effort, chest expands symmetrically. Lungs are clear to auscultation, no crackles or wheezes. Heart:  Normal rate and regular rhythm. S1 and S2 normal without gallop, murmur, click, rub or other extra  sounds. Skin:  patient has several skin tags that are drying. No signs of cellulitis or secondary infection   Impression & Recommendations:  Problem # 1:  ACUTE SINUSITIS, UNSPECIFIED (ICD-461.9)  The following medications were removed from the medication list:    Azithromycin 250 Mg Tabs (Azithromycin) .[name]... 2 by mouth today then one by mouth once daily for 4 days His updated medication list for this problem includes:    Hydrocodone-homatropine 5-1.5 Mg/57ml Syrp (Hydrocodone-homatropine) ..... One tsp by mouth q 4-6 hours as needed cough    Levaquin 500 Mg Tabs (Levofloxacin) ..... One by mouth once daily for 10 days  Complete Medication List: 1)  Advair Diskus 100-50 Mcg/dose Misc (Fluticasone-salmeterol) .[name].. 1 puff q 12 hours 2)  Zyrtec Allergy 10 Mg Tabs (Cetirizine hcl) .... Once daily as needed 3)  Lipitor 40 Mg Tabs (Atorvastatin calcium) .... Once daily 4)  Aspirin 325 Mg Tabs (Aspirin) .... Once daily 5)  Hydrocodone-homatropine 5-1.5 Mg/86ml Syrp (Hydrocodone-homatropine) .... One tsp by mouth q 4-6 hours as needed cough 6)  Levaquin 500 Mg Tabs (Levofloxacin) .... One by mouth once daily for 10 days  Patient Instructions: 1)  Acute sinusitis symptoms for less than 10 days are not helped by antibiotics. Use warm moist compresses, and over the counter decongestants( only as directed). Call if no improvement in 5-7 days, sooner if increasing pain, fever, or new symptoms.  Prescriptions: HYDROCODONE-HOMATROPINE 5-1.5 MG/5ML SYRP (HYDROCODONE-HOMATROPINE) one tsp by mouth q 4-6 hours as needed cough  #120 ml x 0   Entered by:   Sid Falcon LPN   Authorized by:   Evelena Peat MD   Signed by:   Sid Falcon LPN on 40/34/7425  Method used:   Telephoned to ...       [facility] (retail)       [address] [place], [place]  [address]       Ph: [phone] or [phone]       Fax: [phone]   RxID:   [id number] LEVAQUIN 500 MG TABS (LEVOFLOXACIN)  one by mouth once daily for 10 days  #10 x 0   Entered and Authorized by:   Evelena Peat MD   Signed by:   Evelena Peat MD on 09/10/2009   Method used:   Electronically to        [facility]* (retail)       [address] [place], [place]  [address]       Ph: [phone] or [phone]       Fax: [phone]   RxID:   [id number]

## 2010-09-23 NOTE — Assessment & Plan Note (Signed)
Summary: BODY ACHES, FEVER // RS   Vital Signs:  Patient profile:   55 year old male Temp:     103 degrees F oral BP sitting:   110 / 88  (left arm) Cuff size:   large  Vitals Entered By: Sid Falcon LPN (November 01, 2009 4:50 PM) CC: fever, body aches, nausea, X 1 day   History of Present Illness: Acute visit.  Patient seen with acute onset of fever diffuse body aches and malaise around 12 noon today. Had some nausea but no vomiting.  Had chills and fever up to 101. Denies any cough, sore throat, abdominal pain, or any urinary symptoms. One episode of diarrhea this morning which was nonbloody but none since. Was treated with clindamycin over a week ago for dental abscess but finished this a few days ago. Had flu vaccine last fall.  Allergies: 1)  ! Codeine 2)  ! Cephalosporins 3)  ! * Cillin Drugs  Past History:  Past Medical History: Last updated: 11/08/2008 Allergic rhinitis Asthma Coronary artery disease Hyperlipidemia PMH reviewed for relevance  Review of Systems      See HPI  Physical Exam  General:  Well-developed,well-nourished,in no acute distress; alert,appropriate and cooperative throughout examination.  Slightly ill but nontoxic in appearance. Eyes:  no injection.   Ears:  External ear exam shows no significant lesions or deformities.  Otoscopic examination reveals clear canals, tympanic membranes are intact bilaterally without bulging, retraction, inflammation or discharge. Hearing is grossly normal bilaterally. Nose:  External nasal examination shows no deformity or inflammation. Nasal mucosa are pink and moist without lesions or exudates. Mouth:  Oral mucosa and oropharynx without lesions or exudates.  Teeth in good repair. Neck:  supple with no adenopathy noted Lungs:  Normal respiratory effort, chest expands symmetrically. Lungs are clear to auscultation, no crackles or wheezes. Heart:  Normal rate and regular rhythm. S1 and S2 normal without gallop,  murmur, click, rub or other extra sounds. Abdomen:  soft and nontender. No mass Skin:  no rashes noted Cervical Nodes:  No lymphadenopathy noted   Impression & Recommendations:  Problem # 1:  FEVER UNSPECIFIED (ICD-780.60) suspect influenza. Start Tamiflu 75 mg b.i.d. for 5 days. Prompt follow up if symptoms worsen Orders: TLB-CBC Platelet - w/Differential (85025-CBCD) Venipuncture (66440)  Complete Medication List: 1)  Advair Diskus 100-50 Mcg/dose Misc (Fluticasone-salmeterol) .[name].. 1 puff q 12 hours 2)  Zyrtec Allergy 10 Mg Tabs (Cetirizine hcl) .... Once daily as needed 3)  Lipitor 40 Mg Tabs (Atorvastatin calcium) .... Once daily 4)  Aspirin 325 Mg Tabs (Aspirin) .... Once daily 5)  Tamiflu 75 Mg Caps (Oseltamivir phosphate) .... One by mouth two times a day for 5 days 6)  Promethazine Hcl 25 Mg Tabs (Promethazine hcl) .... One by mouth q 6 hours as needed nausea Prescriptions: PROMETHAZINE HCL 25 MG TABS (PROMETHAZINE HCL) one by mouth q 6 hours as needed nausea  #10 x 0   Entered and Authorized by:   Evelena Peat MD   Signed by:   Evelena Peat MD on 11/01/2009   Method used:   Electronically to        [facility]* (retail)       [address] [place], [place]  [address]       Ph: [phone] or [phone]       Fax: [phone]   RxID:   [id number] TAMIFLU 75 MG CAPS (OSELTAMIVIR PHOSPHATE) one by mouth two times a  day for 5 days  #10 x 0   Entered and Authorized by:   Evelena Peat MD   Signed by:   Evelena Peat MD on 11/01/2009   Method used:   Electronically to        [facility]* (retail)       [address]       [place], [place]  [address]       Ph: [phone] or [phone]       Fax: [phone]   RxID:   [id number]

## 2010-09-25 NOTE — Assessment & Plan Note (Signed)
Summary: ?BRONCHITUS/CJR   Vital Signs:  Patient profile:   55 year old male Weight:      291 pounds BMI:     38.53 Temp:     98.7 degrees F oral BP sitting:   112 / 74  (left arm) Cuff size:   large  Vitals Entered By: Alfred Levins, CMA (August 01, 2010 2:39 PM)  Nutrition Counseling: Patient's BMI is greater than 25 and therefore counseled on weight management options.  History of Present Illness: Patient is in with one-week history of nasal congestion and cough which is mostly nonproductive. Intermittent sore throat. Frequent sneezing. Zyrtec without relief. Questionable fever Wednesday night and none since then. No body aches, nausea, vomiting, or diarrhea. Nonsmoker  Current Medications (verified): 1)  Advair Diskus 100-50 Mcg/dose Misc (Fluticasone-Salmeterol) .[name].. 1 Puff Q 12 Hours 2)  Zyrtec Allergy 10 Mg Tabs (Cetirizine Hcl) .... Once Daily As Needed 3)  Lipitor 40 Mg Tabs (Atorvastatin Calcium) .... Once Daily 4)  Aspirin 325 Mg Tabs (Aspirin) .... Once Daily 5)  Fish Oil  Oil (Fish Oil) .[name].. 1 By Mouth Once Daily  Allergies (verified): 1)  ! Codeine 2)  ! Cephalosporins 3)  ! * Cillin Drugs  Past History:  Past Medical History: Last updated: 11/08/2008 Allergic rhinitis Asthma Coronary artery disease Hyperlipidemia PMH reviewed for relevance  Review of Systems      See HPI  Physical Exam  General:  Well-developed,well-nourished,in no acute distress; alert,appropriate and cooperative throughout examination Ears:  External ear exam shows no significant lesions or deformities.  Otoscopic examination reveals clear canals, tympanic membranes are intact bilaterally without bulging, retraction, inflammation or discharge. Hearing is grossly normal bilaterally. Nose:  External nasal examination shows no deformity or inflammation. Nasal mucosa are pink and moist without lesions or exudates. Mouth:  Oral mucosa and oropharynx without lesions or exudates.  Teeth in  good repair. Neck:  No deformities, masses, or tenderness noted. Lungs:  Normal respiratory effort, chest expands symmetrically. Lungs are clear to auscultation, no crackles or wheezes. Heart:  Normal rate and regular rhythm. S1 and S2 normal without gallop, murmur, click, rub or other extra sounds.   Impression & Recommendations:  Problem # 1:  ACUTE BRONCHITIS (ICD-466.0) suspect viral .  No antibiotics rec at this time and he will start only if symptoms worsen over the next several days. His updated medication list for this problem includes:    Advair Diskus 100-50 Mcg/dose Misc (Fluticasone-salmeterol) .[name]... 1 puff q 12 hours    Doxycycline Hyclate 100 Mg Caps (Doxycycline hyclate) ..... One by mouth two times a day for 10 days  Complete Medication List: 1)  Advair Diskus 100-50 Mcg/dose Misc (Fluticasone-salmeterol) .[name].. 1 puff q 12 hours 2)  Zyrtec Allergy 10 Mg Tabs (Cetirizine hcl) .... Once daily as needed 3)  Lipitor 40 Mg Tabs (Atorvastatin calcium) .... Once daily 4)  Aspirin 325 Mg Tabs (Aspirin) .... Once daily 5)  Fish Oil Oil (Fish oil) .[name].. 1 by mouth once daily 6)  Doxycycline Hyclate 100 Mg Caps (Doxycycline hyclate) .... One by mouth two times a day for 10 days  Patient Instructions: 1)  Acute Bronchitis symptoms for less then 10 days are not  helped by antibiotics. Take over the counter cough medications. Call if no improvement in 5-7 days, sooner if increasing cough, fever, or new symptoms ( shortness of breath, chest pain) .  Prescriptions: DOXYCYCLINE HYCLATE 100 MG CAPS (DOXYCYCLINE HYCLATE) one by mouth two times a day  for 10 days  #20 x 0   Entered and Authorized by:   Evelena Peat MD   Signed by:   Evelena Peat MD on 08/01/2010   Method used:   Print then Give to Patient   RxID:   [id number]    Orders Added: 1)  Est. Patient Level III [60109]

## 2010-10-03 ENCOUNTER — Ambulatory Visit (INDEPENDENT_AMBULATORY_CARE_PROVIDER_SITE_OTHER): Payer: 59 | Admitting: Family Medicine

## 2010-10-03 ENCOUNTER — Encounter: Payer: Self-pay | Admitting: Family Medicine

## 2010-10-03 VITALS — BP 140/82 | Temp 98.5°F | Ht 74.0 in | Wt 286.0 lb

## 2010-10-03 DIAGNOSIS — B9789 Other viral agents as the cause of diseases classified elsewhere: Secondary | ICD-10-CM

## 2010-10-03 DIAGNOSIS — B349 Viral infection, unspecified: Secondary | ICD-10-CM

## 2010-10-03 MED ORDER — HYDROCODONE-HOMATROPINE 5-1.5 MG/5ML PO SYRP: 5.0000 mL | ORAL_SOLUTION | Freq: Four times a day (QID) | ORAL | Status: AC | PRN

## 2010-10-03 NOTE — Progress Notes (Signed)
  Subjective:    Patient ID: Eric Palmer, male    DOB: 1956/02/12, 55 y.o.   MRN: [id number]  HPI   Patient seen with onset about 5 days ago of upper respiratory symptoms. Initial mild sore throat followup nasal congestion and cough. Cough mostly nonproductive. Increased malaise. Some mild body aches. Denies any fever or chills. No purulent nasal secretions. Patient has sleep apnea and uses CPAP. Very difficult time sleeping secondary to cough past several days. Has taken Advil, Claritin, and over-the-counter cough medicine without  relief. Reported allergy to codeine but can take hydrocodone. Patient does smoke.  Review of Systems  Constitutional: Positive for fatigue. Negative for fever, chills, diaphoresis and appetite change.  HENT: Positive for congestion, rhinorrhea and sinus pressure. Negative for ear pain, nosebleeds, sore throat and neck stiffness.   Respiratory: Positive for cough. Negative for shortness of breath and wheezing.   Cardiovascular: Negative for chest pain.       Objective:   Physical Exam  Constitutional: He appears well-developed and well-nourished.  HENT:  Head: Normocephalic.  Right Ear: External ear normal.  Left Ear: External ear normal.  Nose: Rhinorrhea present. No sinus tenderness.  Eyes: Pupils are equal, round, and reactive to light.  Neck: Normal range of motion. Neck supple.  Cardiovascular: Normal rate, regular rhythm and normal heart sounds.   No murmur heard. Pulmonary/Chest: No respiratory distress. He has no wheezes. He has no rales.  Lymphadenopathy:    He has no cervical adenopathy.  Skin: No rash noted.          Assessment & Plan:   viral URI. Hycodan cough syrup as needed for cough suppression. Push fluids. Follow up promptly for any fever or worsening symptoms

## 2010-10-03 NOTE — Patient Instructions (Signed)
Viral Syndrome     You or your child have Viral Syndrome. It is the most common infection causing "colds" and infections in the nose, throat, sinuses, and breathing tubes. Sometimes the infection causes nausea, vomiting, or diarrhea. The germ that causes the infection is a virus. No antibiotic or other medicine will kill it. There are medicines that you can take to make you or your child more comfortable.         HOME CARE INSTRUCTIONS   Rest in bed until you start to feel better.   If you have diarrhea or vomiting, eat small amounts of crackers and toast. Soup is helpful.    For children, DO NOT give aspirin or medicine that contains aspirin.   Only take over-the-counter or prescription medicines for pain, discomfort, or fever as directed by your caregiver.     SEEK MEDICAL CARE IF:   You or your child have not improved within one week.   You or your child have pain that is not at least partially relieved by over-the-counter medicine.   Thick, colored mucus or blood is coughed up.   Discharge from the nose becomes thick yellow or green.   Diarrhea or vomiting gets worse.   There is any major change in your or your child's condition.   You or your child develops a skin rash, stiff neck, severe headache, or are unable to hold down food or fluid.   You or your child has an oral temperature above 102 F (38.9 C), not controlled by medicine.   Your baby is older than 3 months with a rectal temperature of 102 F (38.9 C) or higher.   Your baby is 3 months old or younger with a rectal temperature of 100.4 F (38 C) or higher.     Document Released: 07/26/2006  Document Re-Released: 06/07/2009  ExitCare Patient Information 2011 [employer].

## 2010-10-09 ENCOUNTER — Encounter: Payer: Self-pay | Admitting: Family Medicine

## 2010-10-09 ENCOUNTER — Ambulatory Visit (INDEPENDENT_AMBULATORY_CARE_PROVIDER_SITE_OTHER): Payer: 59 | Admitting: Family Medicine

## 2010-10-09 VITALS — BP 120/80 | Temp 98.1°F | Ht 73.75 in | Wt 284.0 lb

## 2010-10-09 DIAGNOSIS — J45909 Unspecified asthma, uncomplicated: Secondary | ICD-10-CM

## 2010-10-09 MED ORDER — METHYLPREDNISOLONE ACETATE 80 MG/ML IJ SUSP
80.0000 mg | Freq: Once | INTRAMUSCULAR | Status: AC
  Administered 2010-10-09: 80 mg via INTRAMUSCULAR

## 2010-10-09 MED ORDER — ALBUTEROL SULFATE HFA 108 (90 BASE) MCG/ACT IN AERS: 2.0000 | INHALATION_SPRAY | RESPIRATORY_TRACT | Status: DC | PRN

## 2010-10-09 MED ORDER — AZITHROMYCIN 250 MG PO TABS: ORAL_TABLET | ORAL | Status: AC

## 2010-10-09 NOTE — Patient Instructions (Signed)
Continue Advair one puff twice daily  Use pro air inhaler as needed but not to exceed every 4 hours  Followup promptly for any fever or worsening symptoms

## 2010-10-09 NOTE — Progress Notes (Signed)
  Subjective:    Patient ID: Eric Palmer, male    DOB: 07-14-1956, 55 y.o.   MRN: [id number]  HPI  Patient seen with persistent chest congestion coughing and increased wheezing. Dyspnea with exertion. Patient has history of asthma and ongoing nicotine use. Signs/ symptoms have worsened somewhat. He remains on Advair 100/50 one puff twice daily. No fever.  No wheezing at prior visit.   Review of Systems Denies any hemoptysis, pleuritic pain, fever.    Objective:   Physical Exam  patient is alert and in no distress.  Oropharynx is moist and clear Eardrums normal Neck supple no adenopathy Chest diffuse wheezes. No rales. No retractions. Heart regular rhythm and rate Skin       Assessment & Plan:   asthmatic bronchitis. Patient encouraged to quit smoking. Start Zithromax. Depo-Medrol 80 mg IM given. Albuterol inhaler as a rescue and continue Advair

## 2010-11-28 LAB — BASIC METABOLIC PANEL
BUN: 10 mg/dL (ref 6–23)
BUN: 9 mg/dL (ref 6–23)
CO2: 28 mEq/L (ref 19–32)
CO2: 28 mEq/L (ref 19–32)
Calcium: 8.7 mg/dL (ref 8.4–10.5)
Calcium: 8.8 mg/dL (ref 8.4–10.5)
Chloride: 102 mEq/L (ref 96–112)
Chloride: 107 mEq/L (ref 96–112)
Creatinine, Ser: 1.09 mg/dL (ref 0.4–1.5)
Creatinine, Ser: 1.11 mg/dL (ref 0.4–1.5)
GFR calc Af Amer: >60 mL/min (ref >60)
GFR calc Af Amer: >60 mL/min (ref >60)
GFR calc non Af Amer: >60 mL/min (ref >60)
GFR calc non Af Amer: >60 mL/min (ref >60)
Glucose, Bld: 102 mg/dL — ABNORMAL HIGH (ref 70–99)
Glucose, Bld: 160 mg/dL — ABNORMAL HIGH (ref 70–99)
Potassium: 3.7 mEq/L (ref 3.5–5.1)
Potassium: 4.4 mEq/L (ref 3.5–5.1)
Sodium: 139 mEq/L (ref 135–145)
Sodium: 142 mEq/L (ref 135–145)

## 2010-11-28 LAB — DIFFERENTIAL
Basophils Absolute: 0 10*3/uL (ref 0.0–0.1)
Basophils Relative: 0 % (ref 0–1)
Eosinophils Absolute: 0.2 10*3/uL (ref 0.0–0.7)
Eosinophils Relative: 2 % (ref 0–5)
Lymphocytes Relative: 22 % (ref 12–46)
Lymphs Abs: 1.8 10*3/uL (ref 0.7–4.0)
Monocytes Absolute: 0.7 10*3/uL (ref 0.1–1.0)
Monocytes Relative: 8 % (ref 3–12)
Neutro Abs: 5.6 10*3/uL (ref 1.7–7.7)
Neutrophils Relative %: 68 % (ref 43–77)

## 2010-11-28 LAB — HEPATIC FUNCTION PANEL
ALT: 35 U/L (ref 0–53)
AST: 23 U/L (ref 0–37)
Albumin: 3.8 g/dL (ref 3.5–5.2)
Alkaline Phosphatase: 89 U/L (ref 39–117)
Bilirubin, Direct: 0.1 mg/dL (ref 0.0–0.3)
Indirect Bilirubin: 0.6 mg/dL (ref 0.3–0.9)
Total Bilirubin: 0.7 mg/dL (ref 0.3–1.2)
Total Protein: 6.3 g/dL (ref 6.0–8.3)

## 2010-11-28 LAB — POCT CARDIAC MARKERS
CKMB, poc: 1.2 ng/mL (ref 1.0–8.0)
CKMB, poc: 1.3 ng/mL (ref 1.0–8.0)
Myoglobin, poc: 100 ng/mL (ref 12–200)
Myoglobin, poc: 117 ng/mL (ref 12–200)
Troponin i, poc: <0.05 ng/mL (ref 0.00–0.09)
Troponin i, poc: <0.05 ng/mL (ref 0.00–0.09)

## 2010-11-28 LAB — CBC
HCT: 41.2 % (ref 39.0–52.0)
HCT: 42.8 % (ref 39.0–52.0)
HCT: 42.8 % (ref 39.0–52.0)
Hemoglobin: 14.4 g/dL (ref 13.0–17.0)
Hemoglobin: 14.8 g/dL (ref 13.0–17.0)
Hemoglobin: 15 g/dL (ref 13.0–17.0)
MCHC: 34.6 g/dL (ref 30.0–36.0)
MCHC: 35 g/dL (ref 30.0–36.0)
MCHC: 35 g/dL (ref 30.0–36.0)
MCV: 89.7 fL (ref 78.0–100.0)
MCV: 89.9 fL (ref 78.0–100.0)
MCV: 90.7 fL (ref 78.0–100.0)
Platelets: 217 10*3/uL (ref 150–400)
Platelets: 217 10*3/uL (ref 150–400)
Platelets: 236 10*3/uL (ref 150–400)
RBC: 4.58 MIL/uL (ref 4.22–5.81)
RBC: 4.73 MIL/uL (ref 4.22–5.81)
RBC: 4.78 MIL/uL (ref 4.22–5.81)
RDW: 12.6 % (ref 11.5–15.5)
RDW: 13.1 % (ref 11.5–15.5)
RDW: 13.1 % (ref 11.5–15.5)
WBC: 8.1 10*3/uL (ref 4.0–10.5)
WBC: 8.3 10*3/uL (ref 4.0–10.5)
WBC: 9.8 10*3/uL (ref 4.0–10.5)

## 2010-11-28 LAB — TROPONIN I: Troponin I: 0.01 ng/mL (ref 0.00–0.06)

## 2010-11-28 LAB — PROTIME-INR
INR: 1.1 (ref 0.00–1.49)
Prothrombin Time: 13.7 seconds (ref 11.6–15.2)

## 2010-11-28 LAB — HEMOGLOBIN A1C
Hgb A1c MFr Bld: 6.4 % — ABNORMAL HIGH (ref 4.6–6.1)
Mean Plasma Glucose: 137 mg/dL

## 2010-11-28 LAB — APTT: aPTT: 33 seconds (ref 24–37)

## 2010-11-28 LAB — CK TOTAL AND CKMB (NOT AT ARMC)
CK, MB: 2 ng/mL (ref 0.3–4.0)
Relative Index: 1 (ref 0.0–2.5)
Total CK: 201 U/L (ref 7–232)

## 2010-11-30 LAB — SEDIMENTATION RATE: Sed Rate: 2 mm/hr (ref 0–16)

## 2010-11-30 LAB — BASIC METABOLIC PANEL
BUN: 14 mg/dL (ref 6–23)
CO2: 30 mEq/L (ref 19–32)
Calcium: 9.2 mg/dL (ref 8.4–10.5)
Chloride: 103 mEq/L (ref 96–112)
Creatinine, Ser: 0.99 mg/dL (ref 0.4–1.5)
GFR calc Af Amer: >60 mL/min (ref >60)
GFR calc non Af Amer: >60 mL/min (ref >60)
Glucose, Bld: 112 mg/dL — ABNORMAL HIGH (ref 70–99)
Potassium: 4.5 mEq/L (ref 3.5–5.1)
Sodium: 139 mEq/L (ref 135–145)

## 2010-11-30 LAB — DIFFERENTIAL
Basophils Absolute: 0.1 10*3/uL (ref 0.0–0.1)
Basophils Relative: 1 % (ref 0–1)
Eosinophils Absolute: 0.3 10*3/uL (ref 0.0–0.7)
Eosinophils Relative: 4 % (ref 0–5)
Lymphocytes Relative: 26 % (ref 12–46)
Lymphs Abs: 2.5 10*3/uL (ref 0.7–4.0)
Monocytes Absolute: 0.9 10*3/uL (ref 0.1–1.0)
Monocytes Relative: 9 % (ref 3–12)
Neutro Abs: 5.7 10*3/uL (ref 1.7–7.7)
Neutrophils Relative %: 60 % (ref 43–77)

## 2010-11-30 LAB — POCT CARDIAC MARKERS
CKMB, poc: 1.3 ng/mL (ref 1.0–8.0)
Myoglobin, poc: 70.3 ng/mL (ref 12–200)
Troponin i, poc: <0.05 ng/mL (ref 0.00–0.09)

## 2010-11-30 LAB — CBC
HCT: 44.5 % (ref 39.0–52.0)
Hemoglobin: 15.5 g/dL (ref 13.0–17.0)
MCHC: 34.8 g/dL (ref 30.0–36.0)
MCV: 90.1 fL (ref 78.0–100.0)
Platelets: 236 10*3/uL (ref 150–400)
RBC: 4.94 MIL/uL (ref 4.22–5.81)
RDW: 12.9 % (ref 11.5–15.5)
WBC: 9.5 10*3/uL (ref 4.0–10.5)

## 2010-11-30 LAB — APTT: aPTT: 35 seconds (ref 24–37)

## 2010-11-30 LAB — PROTIME-INR
INR: 1.1 (ref 0.00–1.49)
Prothrombin Time: 14.9 seconds (ref 11.6–15.2)

## 2010-12-22 ENCOUNTER — Other Ambulatory Visit: Payer: Self-pay | Admitting: Family Medicine

## 2011-01-02 ENCOUNTER — Inpatient Hospital Stay (HOSPITAL_COMMUNITY)
Admission: EM | Admit: 2011-01-02 | Discharge: 2011-01-03 | DRG: 313 | Disposition: A | Discharge status: Home / Self Care | Payer: 59 | Attending: Interventional Cardiology | Admitting: Interventional Cardiology

## 2011-01-02 ENCOUNTER — Emergency Department (HOSPITAL_COMMUNITY): Payer: 59

## 2011-01-02 DIAGNOSIS — R0789 Other chest pain: Principal | ICD-10-CM | POA: Diagnosis present

## 2011-01-02 DIAGNOSIS — G4733 Obstructive sleep apnea (adult) (pediatric): Secondary | ICD-10-CM | POA: Diagnosis present

## 2011-01-02 DIAGNOSIS — I2510 Atherosclerotic heart disease of native coronary artery without angina pectoris: Secondary | ICD-10-CM | POA: Diagnosis present

## 2011-01-02 DIAGNOSIS — Z880 Allergy status to penicillin: Secondary | ICD-10-CM

## 2011-01-02 DIAGNOSIS — Z7982 Long term (current) use of aspirin: Secondary | ICD-10-CM

## 2011-01-02 DIAGNOSIS — J45909 Unspecified asthma, uncomplicated: Secondary | ICD-10-CM | POA: Diagnosis present

## 2011-01-02 DIAGNOSIS — F17200 Nicotine dependence, unspecified, uncomplicated: Secondary | ICD-10-CM | POA: Diagnosis present

## 2011-01-02 DIAGNOSIS — Z9861 Coronary angioplasty status: Secondary | ICD-10-CM

## 2011-01-02 DIAGNOSIS — E7800 Pure hypercholesterolemia, unspecified: Secondary | ICD-10-CM | POA: Diagnosis present

## 2011-01-02 DIAGNOSIS — E785 Hyperlipidemia, unspecified: Secondary | ICD-10-CM | POA: Diagnosis present

## 2011-01-02 DIAGNOSIS — Z79899 Other long term (current) drug therapy: Secondary | ICD-10-CM

## 2011-01-02 DIAGNOSIS — E119 Type 2 diabetes mellitus without complications: Secondary | ICD-10-CM | POA: Diagnosis present

## 2011-01-02 LAB — DIFFERENTIAL
Basophils Absolute: 0 10*3/uL (ref 0.0–0.1)
Basophils Relative: 0 % (ref 0–1)
Eosinophils Absolute: 0.3 10*3/uL (ref 0.0–0.7)
Eosinophils Relative: 3 % (ref 0–5)
Lymphocytes Relative: 20 % (ref 12–46)
Lymphs Abs: 1.9 10*3/uL (ref 0.7–4.0)
Monocytes Absolute: 1 10*3/uL (ref 0.1–1.0)
Monocytes Relative: 10 % (ref 3–12)
Neutro Abs: 6.4 10*3/uL (ref 1.7–7.7)
Neutrophils Relative %: 67 % (ref 43–77)

## 2011-01-02 LAB — CK TOTAL AND CKMB (NOT AT ARMC)
CK, MB: 4.6 ng/mL — ABNORMAL HIGH (ref 0.3–4.0)
Relative Index: 0.7 (ref 0.0–2.5)
Total CK: 668 U/L — ABNORMAL HIGH (ref 7–232)

## 2011-01-02 LAB — BASIC METABOLIC PANEL
BUN: 12 mg/dL (ref 6–23)
CO2: 26 mEq/L (ref 19–32)
Calcium: 9.1 mg/dL (ref 8.4–10.5)
Chloride: 100 mEq/L (ref 96–112)
Creatinine, Ser: 0.97 mg/dL (ref 0.4–1.5)
GFR calc Af Amer: >60 mL/min (ref >60)
GFR calc non Af Amer: >60 mL/min (ref >60)
Glucose, Bld: 202 mg/dL — ABNORMAL HIGH (ref 70–99)
Potassium: 3.9 mEq/L (ref 3.5–5.1)
Sodium: 137 mEq/L (ref 135–145)

## 2011-01-02 LAB — CBC
HCT: 43.8 % (ref 39.0–52.0)
Hemoglobin: 15.3 g/dL (ref 13.0–17.0)
MCH: 30.7 pg (ref 26.0–34.0)
MCHC: 34.9 g/dL (ref 30.0–36.0)
MCV: 87.8 fL (ref 78.0–100.0)
Platelets: 221 10*3/uL (ref 150–400)
RBC: 4.99 MIL/uL (ref 4.22–5.81)
RDW: 12.9 % (ref 11.5–15.5)
WBC: 9.6 10*3/uL (ref 4.0–10.5)

## 2011-01-02 LAB — POCT CARDIAC MARKERS
CKMB, poc: 2.1 ng/mL (ref 1.0–8.0)
Myoglobin, poc: 142 ng/mL (ref 12–200)
Troponin i, poc: <0.05 ng/mL (ref 0.00–0.09)

## 2011-01-02 LAB — D-DIMER, QUANTITATIVE (NOT AT ARMC): D-Dimer, Quant: <0.22 ug/mL-FEU (ref 0.00–0.48)

## 2011-01-02 LAB — TROPONIN I: Troponin I: <0.3 ng/mL (ref <0.30)

## 2011-01-03 LAB — CARDIAC PANEL(CRET KIN+CKTOT+MB+TROPI)
CK, MB: 4.2 ng/mL — ABNORMAL HIGH (ref 0.3–4.0)
CK, MB: 3.9 ng/mL (ref 0.3–4.0)
Relative Index: 0.7 (ref 0.0–2.5)
Relative Index: 0.7 (ref 0.0–2.5)
Total CK: 581 U/L — ABNORMAL HIGH (ref 7–232)
Total CK: 589 U/L — ABNORMAL HIGH (ref 7–232)
Troponin I: <0.3 ng/mL (ref <0.30)
Troponin I: <0.3 ng/mL (ref <0.30)

## 2011-01-04 NOTE — Discharge Summary (Signed)
Eric Palmer, Eric Palmer NO.:  [id number]  MEDICAL RECORD NO.:  [id number]           PATIENT TYPE:  I  LOCATION:  2024                         FACILITY:  [facility]  PHYSICIAN:  Jake Bathe, MD      DATE OF BIRTH:  1956/02/24  DATE OF ADMISSION:  01/02/2011 DATE OF DISCHARGE:  01/03/2011                              DISCHARGE SUMMARY   CARDIOLOGIST:  Lyn Records, MD  FINAL DIAGNOSES: 1. Chest pain. 2. Coronary artery disease - most recent catheterization in February 01, 2006 demonstrated a moderate 50% lesion in the LAD, 40% first     diagonal midportion, widely patent circumflex, right coronary     artery with a shepherd's crook large PDA, no obstruction, and no     prior stent visualized, although he states that he had had a prior     stent placed 15 years ago.  Nuclear stress test in September 2010     was low risk showing no ischemia. 3. Possible diagnosis of diabetes, elevated glucose yesterday of 202    after eating.  Formal diagnosis has not been made. 4. Tobacco use - tobacco cessation counseling at length. 5. Hyperlipidemia - on Lipitor. 6. Obstructive sleep apnea.  BRIEF HOSPITAL COURSE:  A 55 year old male with diabetes, tobacco use, coronary artery disease who states that he had a previously placed stent in 1998, although on angiography this was not seen who was admitted from the emergency department at [facility] with chest discomfort.  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 Yesterday in the morning hours at work, he developed quite severe substernal chest discomfort, felt like someone sitting on his chest and did not have his nitroglycerin present.  He came on to the emergency department for further evaluation and by the time he had his EKG performed, his chest pain had subsided.  Chest pain started out mild, went to lunch, ate, became very severe.  Obviously concerned about this.  Since in the hospital his EKG  has shown sinus rhythm with nonspecific ST- T wave changes and his cardiac biomarkers were all negative.  X-ray showed no acute findings.  His CK was slightly elevated in the 600 range decreasing to 589 on the morning of discharge.  All troponin's were negative.  D-dimer was negative.  Creatinine was 0.97.  His glucose was 202.  PHYSICAL EXAMINATION:  Benign.  Regular rate and rhythm.  No JVD.  Lungs are clear.  He is overweight.  Palpable distal pulses.  DISCHARGE MEDICATIONS:  No changes to his medications have been made. 1. Aspirin 325 mg once a day. 2. Advair Diskus 100/50 mg 1 puff twice a day. 3. Fish oil 1000 mg twice a day. 4. Lipitor 40 mg once a day. 5. Multivitamins once a day. 6. Nitroglycerin 0.4 mg sublingual p.r.n. 7. Vitamin D3 5000 units once at bedtime. 8. Zyrtec 10 mg as needed. 9. Advil 200 mg 2 tablets as needed.  He had recently seen Dr. Katrinka Blazing in clinic, however, I did ask him to please call the  office on Monday for a followup within a week.  Dr. Katrinka Blazing may consider repeat stress test or perhaps repeat cardiac catheterization.  Currently with a negative cardiac biomarker/negative troponin with unremarkable EKG and chest pain free, I feel comfortable allowing him to go home.  We had a lengthy discussion about this and I also gave the option of staying with diagnostic catheterization on Monday.  He is fine with going home.  He knows to contact us immediately if any worrisome symptoms develop.  Once again we discussed tobacco cessation.  DISCHARGE TIME:  35 minutes spent on med reconciliation, patient instruction, review of medical records/labs.     Jake Bathe, MD     MCS/MEDQ  D:  01/03/2011  T:  01/04/2011  Job:  [id number]  cc:   Lyn Records, M.D.  Electronically Signed by Donato Schultz MD on 01/04/2011 09:38:45 PM

## 2011-01-05 ENCOUNTER — Ambulatory Visit (INDEPENDENT_AMBULATORY_CARE_PROVIDER_SITE_OTHER): Payer: 59 | Admitting: Family Medicine

## 2011-01-05 ENCOUNTER — Encounter: Payer: Self-pay | Admitting: Family Medicine

## 2011-01-05 VITALS — BP 120/82 | Temp 99.2°F | Wt 284.0 lb

## 2011-01-05 DIAGNOSIS — IMO0002 Reserved for concepts with insufficient information to code with codable children: Secondary | ICD-10-CM

## 2011-01-05 DIAGNOSIS — L03119 Cellulitis of unspecified part of limb: Secondary | ICD-10-CM

## 2011-01-05 DIAGNOSIS — R079 Chest pain, unspecified: Secondary | ICD-10-CM

## 2011-01-05 MED ORDER — DOXYCYCLINE HYCLATE 100 MG PO TABS: 100.0000 mg | ORAL_TABLET | Freq: Two times a day (BID) | ORAL | Status: AC

## 2011-01-05 NOTE — Progress Notes (Signed)
  Subjective:    Patient ID: Eric Palmer, male    DOB: 10-13-1955, 55 y.o.   MRN: [id number]  HPI Patient with known cardiac history developed chest pain Friday. Admitted to hospital and troponin levels were negative. CK slightly elevated with unremarkable EKG. No suspicion for myocardial infarction. The patient has done well since then with no further chest pain. He has regular followup with cardiologist. Was given Pneumovax prior to discharge left deltoid region. By around noon Saturday developed some redness and warmth medial aspect of left arm. Also developed low-grade fever up to 101 over the weekend. The redness was not at the site of injection. No pruritus. Using Advil for fever reduction  No further chest pain since discharge and he is arranging follow up with his cardiologist.   Review of Systems  Constitutional: Positive for fever and chills.  Respiratory: Negative for cough and shortness of breath.   Cardiovascular: Negative for chest pain, palpitations and leg swelling.  Musculoskeletal: Negative for myalgias and joint swelling.  Skin: Positive for rash. Negative for wound.  Hematological: Negative for adenopathy. Does not bruise/bleed easily.       Objective:   Physical Exam  Constitutional: He appears well-developed and well-nourished.  HENT:  Mouth/Throat: Oropharynx is clear and moist. No oropharyngeal exudate.  Cardiovascular: Normal rate, regular rhythm and normal heart sounds.   Pulmonary/Chest: Effort normal and breath sounds normal. No respiratory distress. He has no wheezes. He has no rales.  Skin:       Left arm medial aspect 12 x 14 cm area of redness and warmth and tenderness. Fairly well-demarcated border.          Assessment & Plan:  Cellulitis left arm. This appears to be more of a cellulitis than allergic type reaction. Not clear this is related to his injection as area of involvement is actually not at the site of injection. Indicators of likely  cellulitis include fever and tenderness to palpation. No pruritus. Patient allergic to penicillin and cephalosporin.  Recent hospitalization.  Cover for MRSA.  Doxycycline and if no prompt improvement broaden antibiotic coverage. Chest pain.  Resolved.  He is encouraged to follow up with cardiology.

## 2011-01-05 NOTE — Patient Instructions (Signed)
Elevate arm frequently. Follow up promptly for any increasing redness or fever

## 2011-01-06 NOTE — Op Note (Signed)
Eric Palmer, Eric Palmer              ACCOUNT NO.:  [id number]   MEDICAL RECORD NO.:  [id number]          PATIENT TYPE:  AMB   LOCATION:  [facility]                         FACILITY:  [facility]   PHYSICIAN:  Anselm Pancoast. Weatherly, M.D.DATE OF BIRTH:  1956/02/03   DATE OF PROCEDURE:  DATE OF DISCHARGE:                               OPERATIVE REPORT   PREOPERATIVE DIAGNOSES:  1. Chronic posterior anal fissure.  2. Multiple skin tags in the groin area.   OPERATION PERFORMED:  1. Internal sphincterotomy and possible partial closure of posterior      anal fissure.  2. Excision of multiple skin tags in the groin area.   ANESTHESIA:  General.   POSITION:  Lithotomy position.   HISTORY:  Eric Palmer is a 55 year old overweight male who was  referred to [place] under the courtesy of Dr. Bosie Clos with the following  history.  The patient has had a problem with kind of reoccurring anal  fissure.  He has had a colonoscopy because of family history of colon  cancer and it was a little polyp, but he has had episodes of anal spasm  for approximately 3-4 months.  He occasionally will have hard bowel  movements and on examination you can see a posterior anal fissure and  fairly marked sphincter spasm.  The patient states that times the areas  will nearly heal, and then at other times it is giving him symptoms  again and he has elected to proceed with internal sphincterotomy.  He  weighs 277 pounds and also has problems with a bunch of old skin tags in  the groin area that he would like to have excised at the same time.   The patient was taken to the operative suite.   ALLERGIES:  ALLERGIC TO PENICILLIN.   PROCEDURE IN DETAIL:  He was given 400 mg of Cipro and then anesthesia  with the Halloway tube.  The patient was placed up in the yellow fin  stirrups and you could see the posterior anal fissure, and he has also  got fairly marked internal and external hemorrhoids.  The tight part of  the sphincter is  easily visualized posteriorly and instead of doing a  left lateral internal sphincterotomy with all these large hemorrhoids, I  elected to elevate the sphincter in midline posterior over a hemostat  and there were 2 bundles and divided it with cautery.  After this was  excised, I then kind of closed the mucosa with interrupted figure-of-  eight sutures of 3-0 chromic to kind of speed up the healing.  I had  excised anal tags and then touched the lower area where they have been  excised, lightly with cautery and there no evidence of bleeding.  We  sent all the tags in a mass.  There were probably 50 or 40 of them all  total.  None were of worrisome concern.   The patient tolerated the procedure.  I put Novocaine ointment over the  anus area, held in place by 4 x 4, and will let him start soaking in the  tube this evening.  He will  be seen back in the office in approximately  2 weeks and hopefully this will allow his pain to be resolved, but also  with the internal sphincterotomy, hopefully his hemorrhoids will become  a lot less symptomatic.           ______________________________  Anselm Pancoast. Zachery Dakins, M.D.     WJW/MEDQ  D:  10/13/2007  T:  10/14/2007  Job:  [id number]   cc:   Evelena Peat, M.D.   Shirley Friar, MD  Fax: [phone]

## 2011-01-06 NOTE — Procedures (Signed)
NAME:  Eric Palmer, Eric Palmer              ACCOUNT NO.:  [id number]   MEDICAL RECORD NO.:  [id number]          PATIENT TYPE:  OUT   LOCATION:  SLEEP CENTER                 [facility]ACILITY[facility]   PHYSICIAN:  Clinton D. Maple Hudson, MD, FCCP, FACPDATE OF BIRTH:  08-Sep-1955   DATE OF STUDY:  05/22/2007                            NOCTURNAL POLYSOMNOGRAM   REFERRING PHYSICIAN:  Evelena Peat, M.D.   INDICATION FOR STUDY:  Hypersomnia with sleep apnea.  A previous study  on May 22, 2004 recorded an apnea/hypopnea index of 60 per hour  and titrated CPAP to 14 CWP.  CPAP titration is now requested.   EPWORTH SLEEPINESS SCORE:  23/24.  BMI 34.2.  Weight 259 pounds.   MEDICATIONS:  Home medications are listed and reviewed.   SLEEP ARCHITECTURE:  Total sleep time 317 minutes with sleep efficiency  73%.  Stage I was 6%, stage II was 51%, stage III 22%, stage REM 21%.  Sleep latency 11 minutes, REM latency 77 minutes.  Awake after sleep  onset 109 minutes.  Arousal index 12.1.  No bedtime medication was  reported.   RESPIRATORY DATA:  CPAP titration protocol.  CPAP was titrated to 13  CWP, apnea/hypopnea index 17.8 per hour.  Bi-PAP was then initiated and  titrated to inspiratory 14, expiratory 8 CWP, apnea/hypopnea index 2.5  per hour.  A medium Mirage Quattro mask was chosen with heated  humidifier.   OXYGEN DATA:  No snoring at final Bi-PAP control with oxygen saturation  92% on room air.   CARDIAC DATA:  Sinus rhythm.   MOVEMENT-PARASOMNIA:  No significant movement disturbance.  Bathroom x1.   IMPRESSIONS-RECOMMENDATIONS:  1. Successful titration to Bi-PAP inspiratory 14, expiratory 8,      apnea/hypopnea index 2.5 per hour.  A full face medium Mirage      Quattro mask was chosen, with heated humidifier.  2. A previous study on May 22, 2004 recorded an apnea/hypopnea      index of 60 per hour with CPAP titration to 14 CWP.     Clinton D. Maple Hudson, MD, FCCP, FACP  Diplomate, Research scientist (medical) of Sleep Medicine  Electronically Signed    CDY/MEDQ  D:  05/24/2007 18:49:51  T:  05/25/2007 10:44:54  Job:  [id number]

## 2011-01-07 ENCOUNTER — Ambulatory Visit (INDEPENDENT_AMBULATORY_CARE_PROVIDER_SITE_OTHER): Payer: 59 | Admitting: Family Medicine

## 2011-01-07 ENCOUNTER — Encounter: Payer: Self-pay | Admitting: Family Medicine

## 2011-01-07 VITALS — BP 122/82 | Temp 98.0°F

## 2011-01-07 DIAGNOSIS — R7309 Other abnormal glucose: Secondary | ICD-10-CM

## 2011-01-07 DIAGNOSIS — L02519 Cutaneous abscess of unspecified hand: Secondary | ICD-10-CM

## 2011-01-07 DIAGNOSIS — L0390 Cellulitis, unspecified: Secondary | ICD-10-CM

## 2011-01-07 DIAGNOSIS — R739 Hyperglycemia, unspecified: Secondary | ICD-10-CM

## 2011-01-07 DIAGNOSIS — L03119 Cellulitis of unspecified part of limb: Secondary | ICD-10-CM

## 2011-01-07 NOTE — Progress Notes (Signed)
  Subjective:    Patient ID: Eric Palmer, male    DOB: 1956-04-20, 55 y.o.   MRN: [id number]  HPI Followup cellulitis left arm. Refer to prior note. Started doxycycline as patient recently in hospital to cover for possible MRSA. Dramatic improvement. Less tender. No fever or chills. Redness has gone down tremendously. Never had any pruritus.  Patient also relates recent visit to the podiatrist. He admits and some vague burning sensation in feet. Glucose 135 fasting. No history of diabetes. No polyuria. No increased thirst. No followup blood sugar since then   Review of Systems As per HPI.    Objective:   Physical Exam  Constitutional: He appears well-developed and well-nourished.  Cardiovascular: Normal rate, regular rhythm and normal heart sounds.   Pulmonary/Chest: Breath sounds normal. No respiratory distress. He has no wheezes.  Musculoskeletal: He exhibits no edema.  Skin:       Patient has some faint erythema left medial arm. Much less tender. Less warm to touch. Overall greatly improved compared to 2 days ago          Assessment & Plan:  #1 cellulitis left arm greatly improved. Finish out antibiotic #2 recent elevated fasting glucose. Discussed weight loss and restriction of sugars and starches and reassess fasting glucose within the next month

## 2011-01-07 NOTE — Patient Instructions (Signed)
Return for fasting glucose in about one month.

## 2011-01-09 NOTE — H&P (Signed)
NAME:  Eric Palmer, Eric Palmer NO.:  [id number]   MEDICAL RECORD NO.:  [id number]          PATIENT TYPE:  EMS   LOCATION:  [facility]                         FACILITY:  [facility]   PHYSICIAN:  Elmore Guise., M.D.DATE OF BIRTH:  [birth year]   DATE OF ADMISSION:  01/30/2006  DATE OF DISCHARGE:                                HISTORY & PHYSICAL   INDICATION FOR ADMISSION:  New onset chest pain.   PRIMARY CARDIOLOGIST:  Lyn Records, M.D. with [facility].   HISTORY OF PRESENT ILLNESS:  The patient is a very pleasant 55 year old  white male past medical history of coronary artery disease (status post PCI  1995, repeat catheterization 2005 showing mid 50% LAD obstruction),  dyslipidemia, obesity, continued tobacco dependence who presents with a 2-  hour history of chest pain, presyncope, and palpitations.  The patient  reports normal state of health until this afternoon.  He has been doing his  normal activities without difficulty.  He does not exercise.  However, he  does yard work as well as plays golf without any significant chest pain or  shortness of breath.  Today, he went to his son's lacrosse game.  He came  home.  After he got home and got up from a rest, he felt an uncomfortable  feeling in his chest, radiating to his left arm.  This was associated with  diaphoresis, shortness of breath, nausea, and presyncope.  He walked to the  garage to discuss the symptoms with his wife.  His symptoms worsened.  He  felt presyncopal, palpitations, worsening diaphoresis, shortness of breath,  and this chest uncomfortable feeling.  On presentation to the ER, the patient was given aspirin, sublingual  nitroglycerin, and IV metoprolol with total resolution of his symptoms.  The  patient denies any fever, chills, nausea, vomiting, or diarrhea.  He does  use CPAP at night.  He reports prior to his stenting in 1995, he had no  significant symptoms.  No chest pain.  This is the first  time he has had  these type of symptoms.  His last cath was in 2005, after an abnormal stress test showing inferior  basal perfusion defect.  The cath showed a left main which was normal, LAD  with a mid 50% stenosis, a circumflex and RCA showed no obstructive disease.  The patient does have a stent card showing stent placement back in 1995, in  his proximal RCA.   CURRENT MEDICATIONS:  1.  Lipitor 40 mg daily.  2.  Advair Diskus.  3.  Aspirin.   ALLERGIES:  1.  PENICILLIN.  2.  CEPHALOSPORIN.  3.  CODEINE.   FAMILY HISTORY:  Positive for heart disease with father and uncles having  heart attacks in their late 37s and early 94s.   SOCIAL HISTORY:  He is married, smokes 1/2 pack per day and has done so for  the last 8 years.  Prior to that he had quit for 8 years.  He does have  occasional alcohol use.  He does not exercise.   PHYSICAL EXAMINATION:  VITAL SIGNS:  He  is afebrile.  Blood pressure is  120/70, heart rate is 60 and regular.  He is sating 95 to 97% on room air.  GENERAL:  He is a very pleasant middle-aged white male, alert and oriented  x4, no acute distress.  NECK:  He has no JVD and no bruits.  No thyromegaly.  LUNGS:  Clear.  HEART:  Regular with a normal S1 S2.  No murmurs, gallops, or rubs.  ABDOMEN:  Soft, nontender, nondistended.  It is obese.  EXTREMITIES:  Warm with 2+ pulses and no significant edema.   His EKG shows sinus tachycardia, 108 per minute, with ST flattening in the  inferior leads as well as leads V3-V6 which is new from his prior tracing,  dated September 19, 2002.  His lab work shows a BUN and creatinine of 9 and  1.0.  He has normal coags.  His cardiac enzymes show a myoglobin of 79, MB  of less than 1.0, troponin I less than 0.05.   IMPRESSION:  1.  Chest pain with associated symptoms.  We will admit for unstable angina.  2.  History of coronary artery disease, status post stenting in 1995, with      past catheterization in 2005 showing  moderate nonobstructive disease.  3.  History of dyslipidemia, obesity, and tobacco dependence.   PLAN:  1.  We will admit to telemetry monitoring.  2.  Continue aspirin, Lovenox, beta-blocker, nitroglycerin.  3.  The patient to have serial cardiac enzymes as well as EKG.  His EKG      changes are new from his prior tracing.  I did discuss potential      catheterization with him on Monday.  Further measures per Dr. Garnette Scheuermann.      Elmore Guise., M.D.  Electronically Signed     TWK/MEDQ  D:  01/30/2006  T:  01/30/2006  Job:  [id number]   cc:   Lyn Records, M.D.  Fax: [phone]

## 2011-01-09 NOTE — Cardiovascular Report (Signed)
NAME:  Eric Palmer, Eric Palmer                        ACCOUNT NO.:  [id number]   MEDICAL RECORD NO.:  [id number]                   PATIENT TYPE:  OIB   LOCATION:  6501                                 FACILITY:  [facility]   PHYSICIAN:  Lesleigh Noe, M.D.            DATE OF BIRTH:  October 02, 1955   DATE OF PROCEDURE:  12/20/2003  DATE OF DISCHARGE:                              CARDIAC CATHETERIZATION   INDICATIONS FOR PROCEDURE:  The patient has history of coronary artery  disease with coronary stent implantation in the right coronary in 1996 while  the patient was living in [place].  Recent Cardiolite study demonstrated  the possibility of inferobasal ischemia.  This study is being done to rule  out progression of right coronary disease.   DATE OF PROCEDURE:  December 20, 2003.   PROCEDURE PERFORMED:  1. Left heart catheterization.  2. Selective coronary angiography.  3. Left ventriculography.   DESCRIPTION:  After informed consent, a 4-French sheath was placed in the  left femoral artery using modified Seldinger technique.  A 4-French Judkins  right catheter was used for hemodynamic recording, left ventriculography by  hand injection and selective right coronary angiography.  A #4 4-French left  Judkins catheter was used for left coronary angiography.  The patient  tolerated the procedure without complications.   RESULTS:   I. HEMODYNAMIC DATA:  A.  Aortic pressure 118/83.  B.  Left ventricular pressure 118/13.   II. LEFT VENTRICULOGRAPHY:  The left ventricle is faintly opacified using  the Judkins right catheter.  Overall, contractility is normal.  We also have  documentation of normal  LV function from the Cardiolite study done  recently.  EF is greater than 50%.  There are no regional wall motion  abnormalities.   III. CORONARY ANGIOGRAPHY:  A.  Left main coronary:  Widely patent.  B.  Left anterior descending coronary:  The LAD is a large vessel that wraps  around the left  ventricular apex.  In the mid vessel there is an angulation  in the LAD that is due to an atherosclerotic plaque that obstructs the  artery by 50%.  This is not felt to be a high grade obstruction.  No  evidence of significant obstructive lesion are felt to be present in the  left coronary system.  Large first diagonal branch contains a 30% mid vessel  stenosis.  C.  Circumflex artery:  Large and trifurcates into three obtuse marginal  branches.  No significant obstructive lesions are noted.  D.  Right coronary:  The right coronary artery is large.  It is somewhat  tortuous and has shepherd's crook origin from the right sinus with Valsalva.  The vessel gives origin to a PDA and a small left ventricular branch.  Irregularity noted in the mid vessel.  By cinefluoroscopy, I am unable to  identify any stent in the right coronary.  No obstructive lesions are noted  in  the right coronary.   CONCLUSION:  1. 50% mid left anterior descending that is felt to be nonobstructive.  This     has been verified by Cardiolite scintigraphy.  2. Right coronary is widely patent as is the circumflex.  3. Normal left ventricular function.   PLAN:  Continue aggressive risk factor modification and clinical followup.  The patient should remain on antiplatelet therapy and statin therapy  indefinitely.                                               Lesleigh Noe, M.D.    HWS/MEDQ  D:  12/20/2003  T:  12/21/2003  Job:  [id number]   cc:   Teena Irani. Arlyce Dice, M.D.  [address]  [place]  [place] [address]  Fax: [phone]

## 2011-01-09 NOTE — Discharge Summary (Signed)
[name], [name] NO.:  [id number]   MEDICAL RECORD NO.:  [id number]          PATIENT TYPE:  INP   LOCATION:  3709                         FACILITY:  [facility]   PHYSICIAN:  Lyn Records, M.D.   DATE OF BIRTH:  [birth year]   DATE OF ADMISSION:  01/30/2006  DATE OF DISCHARGE:                                 DISCHARGE SUMMARY   DISCHARGE DIAGNOSES:  1.  Chest pain, resolved.  2.  Known cardiac artery disease.  3.  Hyperlipidemia, treated.  4.  Dyslipidemia, treated.  5.  Obesity.   HOSPITAL COURSE:  [name] is a 55 year old mental patient with a history  of percutaneous intervention in 1995, who also has a history of obesity and  dyslipidemia.  He was admitted on January 30, 2006, with substernal chest pain.  Cardiac enzymes were negative initially.  Cardiac catheterization was  performed on February 01, 2006.  He appears to have a 50% LAD.  __________  was  unchanged and consistent with a 2005 study.  Otherwise, no other changes.  He had overall normal LV systolic function.  If the patient has recurrent  symptoms which also included palpitations, then the patient is to call and  we are to set him up with an advanced monitor.   FOLLOWUP:  In the meantime he has a follow-up appointment with Dr. Garnette Scheuermann on February 25, 2006 at 11:15 a.m.   MEDICATIONS:  1.  He is to continue his home medications including enteric-coated aspirin      325 mg daily.  2.  Lipitor daily.  3.  Advair twice a day.   WOUND CARE:  He is to clean the cath site gently with soap and water.   ACTIVITY:  No lifting over 10 pounds for one week.  No driving for two days.   DIET:  Remain on a low fat diet.      Guy Franco, P.A.      Lyn Records, M.D.  Electronically Signed    LB/MEDQ  D:  02/01/2006  T:  02/01/2006  Job:  [id number]   cc:   Lyn Records, M.D.  Fax: [phone]

## 2011-01-09 NOTE — Op Note (Signed)
   NAME:  Eric Palmer, Eric Palmer                        ACCOUNT NO.:  [id number]   MEDICAL RECORD NO.:  [id number]                   PATIENT TYPE:  AMB   LOCATION:  DSC                                  FACILITY:  [facility]   PHYSICIAN:  Lowell Bouton, M.D.      DATE OF BIRTH:  05-Aug-1956   DATE OF PROCEDURE:  09/20/2002  DATE OF DISCHARGE:                                 OPERATIVE REPORT   PREOPERATIVE DIAGNOSIS:  Foreign body right palm.   POSTOPERATIVE DIAGNOSIS:  Foreign body right palm.   PROCEDURE:  Excision of foreign body right palm.   SURGEON:  Lowell Bouton, M.D.   ANESTHESIA:  0.5% Marcaine local with sedation.   FINDINGS:  The patient had a metallic foreign body that was located between  the second and third metacarpals palmarly.   DESCRIPTION OF PROCEDURE:  Under 0.5% Marcaine local anesthesia with a  tourniquet on the right arm, the right hand was prepped and draped in the  usual sterile fashion after exsanguinating the limb, the tourniquet was  inflated to 250 mmHg. A transverse incision was made in line with the distal  palmar crease from the second to the third metacarpal.  Blunt dissection was  carried through the subcutaneous tissues and superficial palmar fascia was  divided. Blunt dissection was carried down to the flexor sheath of the index  finger and no foreign body was identified.  The incision was then brought  proximally in an L-shape and further dissection revealed the foreign body  between the second and third metacarpals.  The foreign body was removed and  its reactive tissue around it was excised.  The wound was irrigated with  saline, the skin was closed with 4-0 nylon sutures, sterile dressings were  applied. The tourniquet was released with good circulation of the hand. The  patient went to the recovery room awake, stable, and in good condition.                                               Lowell Bouton, M.D.    EMM/MEDQ  D:  09/20/2002  T:  09/20/2002  Job:  [id number]   cc:   Teena Irani. Arlyce Dice, M.D.  [address]  [place]  [place] [address]  Fax: [phone]

## 2011-01-09 NOTE — Procedures (Signed)
NAME:  Eric Palmer, Eric Palmer              ACCOUNT NO.:  [id number]   MEDICAL RECORD NO.:  [id number]          PATIENT TYPE:  OUT   LOCATION:  SLEEP CENTER                 FACILITY:  [facility]   PHYSICIAN:  Clinton D. Maple Hudson, M.D. DATE OF BIRTH:  [birth year]   DATE OF STUDY:  05/22/2004  DATE OF DISCHARGE:  05/22/2004                              NOCTURNAL POLYSOMNOGRAM   REFERRING PHYSICIAN:  Teena Irani. Arlyce Dice, M.D.   INDICATION FOR THE STUDY:  Hypersomnia with sleep apnea.   EPWORTH SLEEPINESS SCORE:  17/24   NECK SIZE:  17-1/2 inches   BODY MASS INDEX:  33   WEIGHT:  260 pounds   SLEEP ARCHITECTURE:  Total sleep time 397 minutes with sleep efficiency 89%,  stage I was 2%, stage II was 63%, stages III and IV 2%, REM was 33% of total  sleep time.  Latency to sleep onset 12 minutes, Latency to REM 100 minutes.  Awake after sleep onset 39 minutes.  Arousal index 14.   RESPIRATORY DATA:  Split-study protocol.  RDI 60/hr indicating severe  obstructive sleep apnea/hypopnea syndrome before CPAP.  This included 14  obstructive apneas, 9 central apneas, 10 mixed apneas and 89 hypopneas  before CPAP.  Events were not positional.  REM RDI was 10.  CPAP was  titrated to a recommended initial pressure of 14 CWP using a medium Ultra  Mirage Full Face Mask with heated humidifier.   OXYGEN DATA:  Loud snoring with oxygen desaturation to a nadir of 70% during  apneas before CPAP.  After CPAP control oxygen saturation held 96-99% on  room air.   CARDIAC DATA:  Normal sinus rhythm.   MOVEMENT/PARASOMNIA:  One hundred and nineteen limb jerks were recorded but  few of them caused any sleep disturbance.   IMPRESSION/RECOMMENDATION:  Severe obstructive sleep apnea/hypopnea  syndrome, respiratory disturbance index 60/hr.  Successful continuous  positive airway pressure titration to 14 CWP, respiratory disturbance index  0 using a medium Ultra Mirage Full Face Mask with heated humidifier.      CDY/MEDQ   D:  06/01/2004 08:52:10  T:  06/01/2004 18:05:04  Job:  [id number]

## 2011-01-09 NOTE — Cardiovascular Report (Signed)
NAMEMONTERRIO, GERST NO.:  [id number]   MEDICAL RECORD NO.:  [id number]          PATIENT TYPE:  INP   LOCATION:  3709                         FACILITY:  [facility]   PHYSICIAN:  Lyn Records, M.D.   DATE OF BIRTH:  October 21, 1955   DATE OF PROCEDURE:  02/01/2006  DATE OF DISCHARGE:                              CARDIAC CATHETERIZATION   INDICATION:  The patient was admitted to the hospital on January 30, 2006 after  experiencing at least 2 episodes of chest discomfort and arm numbness and  tingling.  He states that during one of the episodes his wife felt his  pulse, and his pulse was rapid.  He did not feel palpitations.  He felt as  though he may faint or go out, however, during each episode.  He has had no  recurrence since hospitalization.   PROCEDURE PERFORMED:  1.  Left heart catheterization.  2.  Selective coronary angiography.  3.  Left ventriculography.   DESCRIPTION:  After informed consent, a 6-French sheath was placed in the  right femoral artery using the modified Seldinger technique.  A 6-French A2  multipurpose catheter was used for hemodynamic recordings, left  ventriculography by hand injection, and selective left and right coronary  angiography.  Angio-Seal arteriotomy closure was performed post-procedure.  The patient tolerated the procedure without complications.   RESULTS:  1.  Hemodynamic data.      1.  Aortic pressure 95/58.      2.  Left ventricular pressure 98/18.  2.  Left ventriculography:  Left ventricular cavity size is normal.  There      is some mild hypokinesis of the anterior wall.  Overall EF is normal at      50%.  No mitral regurgitation.  3.  Coronary angiography.      1.  Left main coronary:  Widely patent.      2.  Left anterior descending coronary:  The LAD wraps around the apex.          It gives origin to one large diagonal.  The LAD is widely patent          with the exception of a kink region in the mid vessel that also         contains 50% narrowing.  The first diagonal in its mid portion          contains an eccentric 40% narrowing.  When compared to the prior          angiogram performed in 2005, no change has occurred.      3.  Circumflex artery:  Widely patent.  Three obtuse marginals also          normal.      4.  Right coronary:  The right coronary artery is a shepherd's crook          vessel with tortuosity, minimal luminal irregularities, large PDA          and left ventricular branch.  No significant obstruction is seen.   CONCLUSIONS:  1.  Stable 50% mid-LAD stenosis, unchanged from 2 years  ago.  2.  Otherwise widely patent coronaries with minimal luminal irregularities.  3.  Overall normal LV function with mild anterior wall hypokinesis.   PLAN:  Continue current medical regimen.  If the patient has recurrent  episodes of this type of symptom, I would consider event monitoring to rule  out arrhythmia.  Will plan to hopefully discharge the patient later on  today.      Lyn Records, M.D.  Electronically Signed     HWS/MEDQ  D:  02/01/2006  T:  02/01/2006  Job:  [id number]   cc:   Teena Irani. Arlyce Dice, M.D.  Fax: [phone]

## 2011-01-27 NOTE — H&P (Signed)
Eric Palmer, SHEEHAN NO.:  [id number]  MEDICAL RECORD NO.:  [id number]           PATIENT TYPE:  I  LOCATION:  2024                         FACILITY:  [facility]  PHYSICIAN:  Corky Crafts, MDDATE OF BIRTH:  05/27/1956  DATE OF ADMISSION:  01/02/2011 DATE OF DISCHARGE:                             HISTORY & PHYSICAL   PRIMARY CARDIOLOGIST:  Lyn Records, MD  REASON FOR ADMISSION:  Coronary artery disease and chest pain.  HISTORY OF PRESENT ILLNESS:  The patient is a 55 year old man who had a stent placed in 1998.  He had a catheterization in 2007 showing moderate coronary artery disease.  He underwent a stress test in 2010 showing no ischemia.  Earlier today starting in the late morning he had severe chest pain, it started out mild, he then went to lunch and after he ate it became very severe, it was unbearable so he came to the emergency room, it felt like someone was standing on his chest.  There was no diaphoresis, arm pain or jaw pain.  He took some Advil.  By the time he came to the emergency room and waited for his ECG, his pain resolved. There is no relation to walking.  He did feel so bad that he not drive himself to the emergency room, he had a coworker to drive him.  PAST MEDICAL HISTORY: 1. Coronary artery disease. 2. Hyperlipidemia. 3. Asthma.  PAST SURGICAL HISTORY:  Tonsillectomy and hernia repair.  ALLERGIES:  CEPHALOSPORINS, PENICILLIN, CODEINE.  SOCIAL HISTORY:  He does smoke.  He works at [employer].  He is married.  FAMILY HISTORY:  Father died of an MI at age 54.  REVIEW OF SYSTEMS:  Significant for feet burning.  He has had issues with ingrown toenails as well.  He reports chest pain.  No diaphoresis. No shortness of breath.  No focal weakness.  No rash.  No bleeding problems.  All other systems negative.  PHYSICAL EXAMINATION:  VITAL SIGNS:  Blood pressure 136/81, pulse 75. GENERAL:  He is awake, alert, no apparent  distress. HEAD:  Normocephalic, atraumatic. EYES:  Extraocular movements intact. NECK:  No JVD. CARDIOVASCULAR:  Regular rate and rhythm.  S1-S2. LUNGS:  Clear to auscultation bilaterally. ABDOMEN:  Obese, nontender. EXTREMITIES:  1+ pitting edema.  Radial pulses 2+ bilaterally and equal NEURO:  No focal deficits. SKIN:  No rash. BACK:  No kyphosis. PSYCH:  Normal mood and affect.  LABORATORY WORK:  Negative troponin, hemoglobin 15.3, creatinine 0.97. ECG shows normal sinus rhythm.  There is some slight inferior ST depressions.  There is some slight lateral ST-segment depression in lead V6.  Chest x-ray shows no acute findings.  ASSESSMENT:  A 55 year old with known coronary artery disease.  PLAN: 1. Cardiac symptoms somewhat concerning for unstable angina.  We     discussed start him on heparin.  He does not want this unless     absolutely necessary.  We will start if his enzymes turn positive. 2. High cholesterol.  Continue Lipitor. 3. He needs to stop smoking. 4. If his enzymes are negative and he remains pain-free, we may be  able to consider discharge tomorrow with outpatient followup.  I     did explain to him that he may need to stay over the weekend and     have a cath depending on how he does.  He is aware.  He is     interested in risk factor modification because I think this pain     scared him somewhat.     Corky Crafts, MD     JSV/MEDQ  D:  01/02/2011  T:  01/03/2011  Job:  [id number]  Electronically Signed by Lance Muss MD on 01/27/2011 10:10:28 AM

## 2011-05-15 LAB — CBC
HCT: 47
Hemoglobin: 15.7
MCHC: 33.3
MCV: 89.9
Platelets: 248
RBC: 5.23
RDW: 12.2
WBC: 9.7

## 2011-05-15 LAB — BASIC METABOLIC PANEL
BUN: 11
CO2: 27
Calcium: 8.8
Chloride: 106
Creatinine, Ser: 0.92
GFR calc Af Amer: >60
GFR calc non Af Amer: >60
Glucose, Bld: 96
Potassium: 4.6
Sodium: 140

## 2011-05-15 LAB — DIFFERENTIAL
Basophils Absolute: 0.1
Basophils Relative: 1
Eosinophils Absolute: 0.4
Eosinophils Relative: 4
Lymphocytes Relative: 27
Lymphs Abs: 2.6
Monocytes Absolute: 1
Monocytes Relative: 10
Neutro Abs: 5.6
Neutrophils Relative %: 58

## 2011-05-15 LAB — I-STAT 8, (EC8 V) (CONVERTED LAB)
Acid-Base Excess: 1
BUN: 11
Bicarbonate: 27 — ABNORMAL HIGH
Chloride: 105
Glucose, Bld: 83
HCT: 53 — ABNORMAL HIGH
Hemoglobin: 18 — ABNORMAL HIGH
Operator id: 268271
Potassium: 4
Sodium: 140
TCO2: 28
pCO2, Ven: 46.8
pH, Ven: 7.369 — ABNORMAL HIGH

## 2011-07-27 ENCOUNTER — Encounter: Payer: Self-pay | Admitting: Family Medicine

## 2011-07-27 ENCOUNTER — Ambulatory Visit (INDEPENDENT_AMBULATORY_CARE_PROVIDER_SITE_OTHER): Payer: 59 | Admitting: Family Medicine

## 2011-07-27 VITALS — BP 110/78 | Temp 99.0°F | Wt 285.0 lb

## 2011-07-27 DIAGNOSIS — G5790 Unspecified mononeuropathy of unspecified lower limb: Secondary | ICD-10-CM

## 2011-07-27 DIAGNOSIS — Z23 Encounter for immunization: Secondary | ICD-10-CM

## 2011-07-27 DIAGNOSIS — M792 Neuralgia and neuritis, unspecified: Secondary | ICD-10-CM

## 2011-07-27 MED ORDER — GABAPENTIN (ONCE-DAILY) 300 MG PO TABS: 1.0000 | ORAL_TABLET | ORAL | Status: DC

## 2011-07-27 NOTE — Patient Instructions (Signed)
IF labs all normal, start neurontin as follows:  One nightly for 3-4 nights and if inadequate relief then titrate to one twice daily

## 2011-07-27 NOTE — Progress Notes (Signed)
  Subjective:    Patient ID: Eric Palmer, male    DOB: 06-14-1956, 55 y.o.   MRN: [id number]  HPI  Bilateral foot and leg pain. Duration is several weeks. No claudication symptoms. Patient has intermittent numbness involving both great toes. He describes pain as" pins and needles" involving feet and lower legs. Symptoms are worse with walking. Has not noted temperature changes or color changes to feet. Advil helps somewhat. Symptoms are moderate to severe at times. Starting to interfere more with walking.  No history of diabetes but has had a couple of elevated nonfasting blood sugars by recent labs. Some urine frequency. Occasional thirst. No weight loss. No history of known neuropathy. Denies weakness. Family history significant for mother with type 2 diabetes. Patient's problems include history of hyperlipidemia, obstructive sleep apnea, obesity, CAD.  Past Medical History  Diagnosis Date  . THRUSH 03/18/2009  . HYPERLIPIDEMIA 11/01/2008  . ASTHMA 11/08/2008   No past surgical history on file.  reports that he has been smoking Cigarettes.  He has a 17 pack-year smoking history. He does not have any smokeless tobacco history on file. His alcohol and drug histories not on file. family history includes Heart disease in his other and Hyperlipidemia in his other. Allergies  Allergen Reactions  . Cephalosporins   . Codeine   . Penicillins Hives      Review of Systems  Constitutional: Negative for fever, chills, appetite change and unexpected weight change.  Respiratory: Negative for cough and shortness of breath.   Cardiovascular: Negative for chest pain.  Gastrointestinal: Negative for abdominal pain.  Genitourinary: Positive for frequency. Negative for dysuria.  Musculoskeletal: Negative for back pain.  Neurological: Positive for numbness. Negative for dizziness, syncope and weakness.  Hematological: Negative for adenopathy. Does not bruise/bleed easily.       Objective:   Physical Exam  Constitutional: He is oriented to person, place, and time. He appears well-developed and well-nourished. No distress.  HENT:  Mouth/Throat: Oropharynx is clear and moist.  Neck: Neck supple. No thyromegaly present.  Cardiovascular: Normal rate and regular rhythm.   Pulmonary/Chest: Effort normal and breath sounds normal. No respiratory distress. He has no wheezes. He has no rales.  Musculoskeletal: He exhibits no edema.       Feet warm to touch with good capillary refill. No lesions. 2+ pulses bilateral  Lymphadenopathy:    He has no cervical adenopathy.  Neurological: He is alert and oriented to person, place, and time. No cranial nerve deficit.  Skin: No rash noted.  Psychiatric: He has a normal mood and affect. His behavior is normal.          Assessment & Plan:  New problem of bilateral dysesthesias and pain involving both feet and lower legs. Pain sounds neuropathic. Check labs to rule out diabetes. Also check TSH, sedimentation rate, B12.  If labs all normal consider called Neurontin.  Patient is encouraged to stop smoking. Current symptoms are not claudication type symptoms

## 2011-07-28 ENCOUNTER — Telehealth: Payer: Self-pay | Admitting: Family Medicine

## 2011-07-28 LAB — BASIC METABOLIC PANEL
BUN: 15 mg/dL (ref 6–23)
CO2: 26 mEq/L (ref 19–32)
Calcium: 9.1 mg/dL (ref 8.4–10.5)
Chloride: 105 mEq/L (ref 96–112)
Creatinine, Ser: 1 mg/dL (ref 0.4–1.5)
GFR: 84.54 mL/min (ref >60.00)
Glucose, Bld: 136 mg/dL — ABNORMAL HIGH (ref 70–99)
Potassium: 4 mEq/L (ref 3.5–5.1)
Sodium: 139 mEq/L (ref 135–145)

## 2011-07-28 LAB — SEDIMENTATION RATE: Sed Rate: 5 mm/hr (ref 0–22)

## 2011-07-28 LAB — VITAMIN B12: Vitamin B-12: 408 pg/mL (ref 211–911)

## 2011-07-28 LAB — TSH: TSH: 0.68 u[IU]/mL (ref 0.35–5.50)

## 2011-07-28 NOTE — Telephone Encounter (Signed)
Pt called req to get lab results. Pls call.

## 2011-07-29 ENCOUNTER — Other Ambulatory Visit: Payer: Self-pay | Admitting: Family Medicine

## 2011-07-29 DIAGNOSIS — R739 Hyperglycemia, unspecified: Secondary | ICD-10-CM

## 2011-07-29 NOTE — Telephone Encounter (Signed)
I called with lab results earlier today, copy mailed to his home

## 2011-07-29 NOTE — Telephone Encounter (Signed)
I spoke with pt, yes he is to start the Gabapentin

## 2011-07-29 NOTE — Progress Notes (Signed)
Quick Note:  Pt informed on home VM, will send a copy to pt home, future lab ordered ______

## 2011-07-29 NOTE — Telephone Encounter (Signed)
Pt called back to get lab results. Pt has been informed that nurse mailed results to pts home, but pt needs to know what the results are, because he needs to know if he should take Gabapentin, PHN, 300 MG TABS or not. Pls call pt today, on cell #.

## 2011-09-11 ENCOUNTER — Encounter: Payer: Self-pay | Admitting: Family Medicine

## 2011-09-11 ENCOUNTER — Ambulatory Visit (INDEPENDENT_AMBULATORY_CARE_PROVIDER_SITE_OTHER): Payer: 59 | Admitting: Family Medicine

## 2011-09-11 VITALS — BP 110/70 | Temp 98.7°F | Wt 295.0 lb

## 2011-09-11 DIAGNOSIS — J3489 Other specified disorders of nose and nasal sinuses: Secondary | ICD-10-CM

## 2011-09-11 DIAGNOSIS — IMO0001 Reserved for inherently not codable concepts without codable children: Secondary | ICD-10-CM

## 2011-09-11 MED ORDER — METHYLPREDNISOLONE ACETATE 80 MG/ML IJ SUSP
80.0000 mg | Freq: Once | INTRAMUSCULAR | Status: AC
  Administered 2011-09-11: 80 mg via INTRAMUSCULAR

## 2011-09-11 NOTE — Patient Instructions (Signed)
Get back on daily Zyrtec.

## 2011-09-11 NOTE — Progress Notes (Signed)
  Subjective:    Patient ID: Eric Palmer, male    DOB: 12-Jan-1956, 56 y.o.   MRN: [id number]  HPI  Patient seen with allergy-like symptoms. Similar episode last winter. Uncertain precipitant. He had nasal congestion and sneezing. Frequent symptoms of watery itchy eyes. Nonproductive cough. He has history of asthma but has not noted any significant wheezing. He tried some Sudafed without much improvement. Generally takes Zyrtec but has not taken any of the past week. He denies any fever or chills   Review of Systems  Constitutional: Negative for fever and chills.  HENT: Positive for congestion and sinus pressure. Negative for sore throat.   Respiratory: Negative for cough, shortness of breath and wheezing.   Cardiovascular: Negative for chest pain.       Objective:   Physical Exam  Constitutional: He appears well-developed and well-nourished. No distress.  HENT:  Right Ear: External ear normal.  Left Ear: External ear normal.  Mouth/Throat: Oropharynx is clear and moist.       Clear rhinorrhea.  Neck: Neck supple.  Cardiovascular: Normal rate and regular rhythm.   Pulmonary/Chest: Effort normal and breath sounds normal. No respiratory distress. He has no wheezes. He has no rales.  Lymphadenopathy:    He has no cervical adenopathy.          Assessment & Plan:  Allergic rhinitis. Patient responded well in past to Depo-Medrol. Depo-Medrol 80 mg given. Get back on Zyrtec 10 mg daily. Followup for fasting blood sugar approximately one month

## 2011-10-20 ENCOUNTER — Other Ambulatory Visit: Payer: Self-pay | Admitting: Family Medicine

## 2012-01-04 ENCOUNTER — Encounter: Payer: Self-pay | Admitting: Family Medicine

## 2012-01-04 ENCOUNTER — Ambulatory Visit (INDEPENDENT_AMBULATORY_CARE_PROVIDER_SITE_OTHER): Payer: 59 | Admitting: Family Medicine

## 2012-01-04 VITALS — BP 140/80 | Temp 98.5°F | Wt 282.0 lb

## 2012-01-04 DIAGNOSIS — J310 Chronic rhinitis: Secondary | ICD-10-CM

## 2012-01-04 DIAGNOSIS — G4733 Obstructive sleep apnea (adult) (pediatric): Secondary | ICD-10-CM

## 2012-01-04 NOTE — Patient Instructions (Signed)
Consider complete physical later this year. 

## 2012-01-04 NOTE — Progress Notes (Signed)
  Subjective:    Patient ID: Eric Palmer, male    DOB: 07-24-1956, 56 y.o.   MRN: [id number]  HPI  Patient seen with some irritation of the throat and particularly dryness of the mouth. He has history of obstructive sleep apnea and uses CPAP. Recently his humidifier broke and he is requesting order for replacement of equipment. His equipment is over 60 years old. He has tremendous fatigue and daytime sedation when leaving off BiPAP. Denies any urine frequency. No weight loss.  He had some nasal congestion and has history of seasonal allergies. Remains on Zyrtec daily. Denies any fever or chills. No headaches.  Other problems include history of CAD, hyperlipidemia, and asthma. He continues to smoke and has low motivation to quit. Denies any recent chest pain.  Past Medical History  Diagnosis Date  . THRUSH 03/18/2009  . HYPERLIPIDEMIA 11/01/2008  . ASTHMA 11/08/2008   No past surgical history on file.  reports that he has been smoking Cigarettes.  He has a 17 pack-year smoking history. He does not have any smokeless tobacco history on file. His alcohol and drug histories not on file. family history includes Heart disease in his other and Hyperlipidemia in his other. Allergies  Allergen Reactions  . Cephalosporins   . Codeine   . Gabapentin     "made me feel drugged"  . Penicillins Hives      Review of Systems  Constitutional: Positive for fatigue.  HENT: Positive for congestion and sore throat. Negative for sinus pressure.   Respiratory: Negative for cough and shortness of breath.   Cardiovascular: Negative for chest pain.       Objective:   Physical Exam  Constitutional: He appears well-developed and well-nourished.  HENT:  Right Ear: External ear normal.  Left Ear: External ear normal.  Mouth/Throat: Oropharynx is clear and moist.  Neck: Neck supple.  Cardiovascular: Normal rate and regular rhythm.   Pulmonary/Chest: Effort normal and breath sounds normal. No  respiratory distress. He has no wheezes. He has no rales.  Lymphadenopathy:    He has no cervical adenopathy.          Assessment & Plan:  Patient presents with rhinitis and nasal congestion. He attributes this to lack of humidifier with CPAP. We wrote a prescription for new equipment-has used bipap with full face mirage quattro mask with heated humidifier. Continue Zyrtec in the meantime.  Discussed smoking cessation but his current motivation is low.

## 2012-01-21 ENCOUNTER — Other Ambulatory Visit (INDEPENDENT_AMBULATORY_CARE_PROVIDER_SITE_OTHER): Payer: 59

## 2012-01-21 DIAGNOSIS — Z0000 Encounter for general adult medical examination without abnormal findings: Secondary | ICD-10-CM

## 2012-01-21 LAB — BASIC METABOLIC PANEL
BUN: 11 mg/dL (ref 6–23)
CO2: 26 mEq/L (ref 19–32)
Calcium: 8.8 mg/dL (ref 8.4–10.5)
Chloride: 108 mEq/L (ref 96–112)
Creatinine, Ser: 0.8 mg/dL (ref 0.4–1.5)
GFR: 103.66 mL/min (ref >60.00)
Glucose, Bld: 150 mg/dL — ABNORMAL HIGH (ref 70–99)
Potassium: 4.3 mEq/L (ref 3.5–5.1)
Sodium: 141 mEq/L (ref 135–145)

## 2012-01-21 LAB — CBC WITH DIFFERENTIAL/PLATELET
Basophils Absolute: 0.1 10*3/uL (ref 0.0–0.1)
Basophils Relative: 0.7 % (ref 0.0–3.0)
Eosinophils Absolute: 0.4 10*3/uL (ref 0.0–0.7)
Eosinophils Relative: 4.6 % (ref 0.0–5.0)
HCT: 46.6 % (ref 39.0–52.0)
Hemoglobin: 15.8 g/dL (ref 13.0–17.0)
Lymphocytes Relative: 28.7 % (ref 12.0–46.0)
Lymphs Abs: 2.3 10*3/uL (ref 0.7–4.0)
MCHC: 33.8 g/dL (ref 30.0–36.0)
MCV: 91.8 fl (ref 78.0–100.0)
Monocytes Absolute: 0.8 10*3/uL (ref 0.1–1.0)
Monocytes Relative: 9.8 % (ref 3.0–12.0)
Neutro Abs: 4.5 10*3/uL (ref 1.4–7.7)
Neutrophils Relative %: 56.2 % (ref 43.0–77.0)
Platelets: 221 10*3/uL (ref 150.0–400.0)
RBC: 5.08 Mil/uL (ref 4.22–5.81)
RDW: 13.1 % (ref 11.5–14.6)
WBC: 8 10*3/uL (ref 4.5–10.5)

## 2012-01-21 LAB — HEPATIC FUNCTION PANEL
ALT: 38 U/L (ref 0–53)
AST: 22 U/L (ref 0–37)
Albumin: 4.1 g/dL (ref 3.5–5.2)
Alkaline Phosphatase: 83 U/L (ref 39–117)
Bilirubin, Direct: 0.1 mg/dL (ref 0.0–0.3)
Total Bilirubin: 0.6 mg/dL (ref 0.3–1.2)
Total Protein: 6.7 g/dL (ref 6.0–8.3)

## 2012-01-21 LAB — POCT URINALYSIS DIPSTICK
Bilirubin, UA: NEGATIVE
Blood, UA: NEGATIVE
Glucose, UA: NEGATIVE
Ketones, UA: NEGATIVE
Leukocytes, UA: NEGATIVE
Nitrite, UA: NEGATIVE
Protein, UA: NEGATIVE
Spec Grav, UA: 1.025
Urobilinogen, UA: 0.2
pH, UA: 5

## 2012-01-21 LAB — LIPID PANEL
Cholesterol: 115 mg/dL (ref 0–200)
HDL: 30.4 mg/dL — ABNORMAL LOW (ref >39.00)
Total CHOL/HDL Ratio: 4
Triglycerides: 284 mg/dL — ABNORMAL HIGH (ref 0.0–149.0)
VLDL: 56.8 mg/dL — ABNORMAL HIGH (ref 0.0–40.0)

## 2012-01-21 LAB — PSA: PSA: 0.37 ng/mL (ref 0.10–4.00)

## 2012-01-21 LAB — LDL CHOLESTEROL, DIRECT: Direct LDL: 44.4 mg/dL

## 2012-01-21 LAB — TSH: TSH: 1.2 u[IU]/mL (ref 0.35–5.50)

## 2012-01-23 ENCOUNTER — Other Ambulatory Visit: Payer: Self-pay | Admitting: Family Medicine

## 2012-02-01 ENCOUNTER — Encounter: Payer: Self-pay | Admitting: Family Medicine

## 2012-02-01 ENCOUNTER — Ambulatory Visit (INDEPENDENT_AMBULATORY_CARE_PROVIDER_SITE_OTHER): Payer: 59 | Admitting: Family Medicine

## 2012-02-01 VITALS — BP 130/80 | HR 100 | Temp 98.0°F | Resp 12 | Ht 74.0 in | Wt 278.0 lb

## 2012-02-01 DIAGNOSIS — M25569 Pain in unspecified knee: Secondary | ICD-10-CM

## 2012-02-01 DIAGNOSIS — M25561 Pain in right knee: Secondary | ICD-10-CM

## 2012-02-01 DIAGNOSIS — Z0000 Encounter for general adult medical examination without abnormal findings: Secondary | ICD-10-CM

## 2012-02-01 DIAGNOSIS — E8881 Metabolic syndrome: Secondary | ICD-10-CM

## 2012-02-01 DIAGNOSIS — Z23 Encounter for immunization: Secondary | ICD-10-CM

## 2012-02-01 NOTE — Patient Instructions (Addendum)
Metabolic Syndrome, Adult Metabolic syndrome descibes a group of risk factors for heart disease and diabetes. This syndrome has other names including Insulin Resistance Syndrome. The more risk factors you have, the higher your risk of having a heart attack, stroke, or developing diabetes. These risk factors include:  High blood sugar.   High blood triglyceride (a fat found in the blood) level.   High blood pressure.   Abdominal obesity (your extra weight is around your waist instead of your hips).   Low levels of high-density lipoprotein, HDL (good blood cholesterol).  If you have any three of these risk factors, you have metabolic syndrome. If you have even one of these factors, you should make lifestyle changes to improve your health in order to prevent serious health diseases.  In people with metabolic syndrome, the cells do not respond properly to insulin. This can lead to high levels of glucose in the blood, which can interfere with normal body processes. Eventually, this can cause high blood pressure and higher fat levels in the blood, and inflammation of your blood vessels. The result can be heart disease and stroke.  CAUSES   Eating a diet rich in calories and saturated fat.   Too little physical activity.   Being overweight.  Other underlying causes are:  Family history (genetics).   Ethnicity (South Asians are at a higher risk).   Older age (your chances of developing metabolic syndrome are higher as you grow older).   Insulin resistance.  SYMPTOMS  By itself, metabolic syndrome has no symptoms. However, you might have symptoms of diabetes (high blood sugar) or high blood pressure, such as:  Increased thirst, urination, and tiredness.   Dizzy spells.   Dull headaches that are unusual for you.   Blurred vision.   Nosebleeds.  DIAGNOSIS  Your caregiver may make a diagnosis of metabolic syndrome if you have at least three of these factors:  If you are overweight  mostly around the waist. This means a waistline greater than 40" in men and more than 35" in women. The waistline limits are 31 to 35 inches for women and 37 to 39 inches for men. In those who have certain genetic risk factors, such as having a family history of diabetes or being of Asian descent.   If you have a blood pressure of 130/85 mm Hg or more, or if you are being treated for high blood pressure.   If your blood triglyceride level is 150 mg/dL or more, or you are being treated for high levels of triglyceride.   If the level of HDL in your blood is below 40 mg/dL in men, less than 50 mg/dL in women, or you are receiving treatment for low levels of HDL.   If the level of sugar in your blood is high with fasting blood sugar level of 110 mg/dL or more, or you are under treatment for diabetes.  TREATMENT  Your caregiver may have you make lifestyle changes, which may include:  Exercise.   Losing weight.   Maintaining a healthy diet.   Quitting smoking.  The lifestyle changes listed above are key in reducing your risk for heart disease and stroke. Medicines may also be prescribed to help your body respond to insulin better and to reduce your blood pressure and blood fat levels. Aspirin may be recommended to reduce risks of heart disease or stroke.  HOME CARE INSTRUCTIONS   Exercise.   Measure your waist at regular intervals just above the hipbones after  you have breathed out.   Maintain a healthy diet.   Eat fruits, such as apples, oranges, and pears.   Eat vegetables.   Eat legumes, such as kidney beans, peas, and lentils.   Eat food rich in soluble fiber, such as whole grain cereal, oatmeal, and oat bran.   Use olive or safflower oils and avoid saturated fats.   Eat nuts.   Limit the amount of salt you eat or add to food.   Limit the amount of alcohol you drink.   Include fish in your diet, if possible.   Stop smoking if you are a smoker.   Maintain regular  follow-up appointments.   Follow your caregiver's advice.  SEEK MEDICAL CARE IF:   You feel very tired or fatigued.   You develop excessive thirst.   You pass large quantities of urine.   You are putting on weight around your waist rather than losing weight.   You develop headaches over and over again.   You have off-and-on dizzy spells.  SEEK IMMEDIATE MEDICAL CARE IF:   You develop nosebleeds.   You develop sudden blurred vision.   You develop sudden dizzy spells.   You develop chest pains, trouble breathing, or feel an abnormal or irregular heart beat.   You have a fainting episode.   You develop any sudden trouble speaking and/or swallowing.   You develop sudden weakness in one arm and/or one leg.  MAKE SURE YOU:   Understand these instructions.   Will watch your condition.   Will get help right away if you are not doing well or get worse.  Document Released: 11/17/2007 Document Revised: 07/30/2011 Document Reviewed: 11/17/2007 [facility] Patient Information 2012 [place], [place].Smoking Cessation This document explains the best ways for you to quit smoking and new treatments to help. It lists new medicines that can double or triple your chances of quitting and quitting for good. It also considers ways to avoid relapses and concerns you may have about quitting, including weight gain. NICOTINE: A POWERFUL ADDICTION If you have tried to quit smoking, you know how hard it can be. It is hard because nicotine is a very addictive drug. For some people, it can be as addictive as heroin or cocaine. Usually, people make 2 or 3 tries, or more, before finally being able to quit. Each time you try to quit, you can learn about what helps and what hurts. Quitting takes hard work and a lot of effort, but you can quit smoking. QUITTING SMOKING IS ONE OF THE MOST IMPORTANT THINGS YOU WILL EVER DO.  You will live longer, feel better, and live better.   The impact on your body of  quitting smoking is felt almost immediately:   Within 20 minutes, blood pressure decreases. Pulse returns to its normal level.   After 8 hours, carbon monoxide levels in the blood return to normal. Oxygen level increases.   After 24 hours, chance of heart attack starts to decrease. Breath, hair, and body stop smelling like smoke.   After 48 hours, damaged nerve endings begin to recover. Sense of taste and smell improve.   After 72 hours, the body is virtually free of nicotine. Bronchial tubes relax and breathing becomes easier.   After 2 to 12 weeks, lungs can hold more air. Exercise becomes easier and circulation improves.   Quitting will reduce your risk of having a heart attack, stroke, cancer, or lung disease:   After 1 year, the risk of coronary heart disease  is cut in half.   After 5 years, the risk of stroke falls to the same as a nonsmoker.   After 10 years, the risk of lung cancer is cut in half and the risk of other cancers decreases significantly.   After 15 years, the risk of coronary heart disease drops, usually to the level of a nonsmoker.   If you are pregnant, quitting smoking will improve your chances of having a healthy baby.   The people you live with, especially your children, will be healthier.   You will have extra money to spend on things other than cigarettes.  FIVE KEYS TO QUITTING Studies have shown that these 5 steps will help you quit smoking and quit for good. You have the best chances of quitting if you use them together: 1. Get ready.  2. Get support and encouragement.  3. Learn new skills and behaviors.  4. Get medicine to reduce your nicotine addiction and use it correctly.  5. Be prepared for relapse or difficult situations. Be determined to continue trying to quit, even if you do not succeed at first.  1. GET READY  Set a quit date.   Change your environment.   Get rid of ALL cigarettes, ashtrays, matches, and lighters in your home, car,  and place of work.   Do not let people smoke in your home.   Review your past attempts to quit. Think about what worked and what did not.   Once you quit, do not smoke. NOT EVEN A PUFF!  2. GET SUPPORT AND ENCOURAGEMENT Studies have shown that you have a better chance of being successful if you have help. You can get support in many ways.  Tell your family, friends, and coworkers that you are going to quit and need their support. Ask them not to smoke around you.   Talk to your caregivers (doctor, dentist, nurse, pharmacist, psychologist, and/or smoking counselor).   Get individual, group, or telephone counseling and support. The more counseling you have, the better your chances are of quitting. Programs are available at [employer] and health centers. Call your local health department for information about programs in your area.   Spiritual beliefs and practices may help some smokers quit.   Quit meters are Photographer that keep track of quit statistics, such as amount of "quit-time," cigarettes not smoked, and money saved.   Many smokers find one or more of the many self-help books available useful in helping them quit and stay off tobacco.  3. LEARN NEW SKILLS AND BEHAVIORS  Try to distract yourself from urges to smoke. Talk to someone, go for a walk, or occupy your time with a task.   When you first try to quit, change your routine. Take a different route to work. Drink tea instead of coffee. Eat breakfast in a different place.   Do something to reduce your stress. Take a hot bath, exercise, or read a book.   Plan something enjoyable to do every day. Reward yourself for not smoking.   Explore interactive web-based programs that specialize in helping you quit.  4. GET MEDICINE AND USE IT CORRECTLY Medicines can help you stop smoking and decrease the urge to smoke. Combining medicine with the above behavioral methods and support can quadruple  your chances of successfully quitting smoking. The U[place]. Food and Drug Administration (FDA) has approved 7 medicines to help you quit smoking. These medicines fall into 3 categories.  Nicotine replacement therapy (  delivers nicotine to your body without the negative effects and risks of smoking):   Nicotine gum: Available over-the-counter.   Nicotine lozenges: Available over-the-counter.   Nicotine inhaler: Available by prescription.   Nicotine nasal spray: Available by prescription.   Nicotine skin patches (transdermal): Available by prescription and over-the-counter.   Antidepressant medicine (helps people abstain from smoking, but how this works is unknown):   Bupropion sustained-release (SR) tablets: Available by prescription.   Nicotinic receptor partial agonist (simulates the effect of nicotine in your brain):   Varenicline tartrate tablets: Available by prescription.   Ask your caregiver for advice about which medicines to use and how to use them. Carefully read the information on the package.   Everyone who is trying to quit may benefit from using a medicine. If you are pregnant or trying to become pregnant, nursing an infant, you are under age 54, or you smoke fewer than 10 cigarettes per day, talk to your caregiver before taking any nicotine replacement medicines.   You should stop using a nicotine replacement product and call your caregiver if you experience nausea, dizziness, weakness, vomiting, fast or irregular heartbeat, mouth problems with the lozenge or gum, or redness or swelling of the skin around the patch that does not go away.   Do not use any other product containing nicotine while using a nicotine replacement product.   Talk to your caregiver before using these products if you have diabetes, heart disease, asthma, stomach ulcers, you had a recent heart attack, you have high blood pressure that is not controlled with medicine, a history of irregular heartbeat, or  you have been prescribed medicine to help you quit smoking.  5. BE PREPARED FOR RELAPSE OR DIFFICULT SITUATIONS  Most relapses occur within the first 3 months after quitting. Do not be discouraged if you start smoking again. Remember, most people try several times before they finally quit.   You may have symptoms of withdrawal because your body is used to nicotine. You may crave cigarettes, be irritable, feel very hungry, cough often, get headaches, or have difficulty concentrating.   The withdrawal symptoms are only temporary. They are strongest when you first quit, but they will go away within 10 to 14 days.  Here are some difficult situations to watch for:  Alcohol. Avoid drinking alcohol. Drinking lowers your chances of successfully quitting.   Caffeine. Try to reduce the amount of caffeine you consume. It also lowers your chances of successfully quitting.   Other smokers. Being around smoking can make you want to smoke. Avoid smokers.   Weight gain. Many smokers will gain weight when they quit, usually less than 10 pounds. Eat a healthy diet and stay active. Do not let weight gain distract you from your main goal, quitting smoking. Some medicines that help you quit smoking may also help delay weight gain. You can always lose the weight gained after you quit.   Bad mood or depression. There are a lot of ways to improve your mood other than smoking.  If you are having problems with any of these situations, talk to your caregiver. SPECIAL SITUATIONS AND CONDITIONS Studies suggest that everyone can quit smoking. Your situation or condition can give you a special reason to quit.  Pregnant women/new mothers: By quitting, you protect your baby's health and your own.   Hospitalized patients: By quitting, you reduce health problems and help healing.   Heart attack patients: By quitting, you reduce your risk of a second heart attack.  Lung, head, and neck cancer patients: By quitting, you  reduce your chance of a second cancer.   Parents of children and adolescents: By quitting, you protect your children from illnesses caused by secondhand smoke.  QUESTIONS TO THINK ABOUT Think about the following questions before you try to stop smoking. You may want to talk about your answers with your caregiver.  Why do you want to quit?   If you tried to quit in the past, what helped and what did not?   What will be the most difficult situations for you after you quit? How will you plan to handle them?   Who can help you through the tough times? Your family? Friends? Caregiver?   What pleasures do you get from smoking? What ways can you still get pleasure if you quit?  Here are some questions to ask your caregiver:  How can you help me to be successful at quitting?   What medicine do you think would be best for me and how should I take it?   What should I do if I need more help?   What is smoking withdrawal like? How can I get information on withdrawal?  Quitting takes hard work and a lot of effort, but you can quit smoking. FOR MORE INFORMATION  Smokefree.gov (http://www.davis-sullivan.com/) provides free, accurate, evidence-based information and professional assistance to help support the immediate and long-term needs of people trying to quit smoking. Document Released: 08/04/2001 Document Revised: 07/30/2011 Document Reviewed: 05/27/2009 [facility] Patient Information 2012 [place], [place].

## 2012-02-01 NOTE — Progress Notes (Signed)
  Subjective:    Patient ID: Eric Palmer, male    DOB: 1956/04/30, 56 y.o.   MRN: [id number]  HPI  Complete physical. Patient history of coronary disease, ongoing nicotine use, hyperlipidemia, asthma, and obstructive sleep apnea.  Patient has history of poor compliance. No exercise. Ongoing nicotine use. Compliant with medications. Takes Lipitor for hyperlipidemia. Is followed annually by cardiologist.  Complains of some right knee pain over the past 3 months. Worse with climbing stairs. Location is under kneecap. No effusion. No locking or giving way. No relief with over-the-counter medications.  Past Medical History  Diagnosis Date  . THRUSH 03/18/2009  . HYPERLIPIDEMIA 11/01/2008  . ASTHMA 11/08/2008  . Obstructive sleep apnea    No past surgical history on file.  reports that he has been smoking Cigarettes.  He has a 17 pack-year smoking history. He does not have any smokeless tobacco history on file. His alcohol and drug histories not on file. family history includes Heart disease in his other; Heart disease (age of onset:30) in his father; Heart disease (age of onset:79) in his mother; and Hyperlipidemia in his other. Allergies  Allergen Reactions  . Cephalosporins   . Codeine   . Gabapentin     "made me feel drugged"  . Penicillins Hives      Review of Systems  Constitutional: Positive for fatigue. Negative for fever, appetite change and unexpected weight change.  HENT: Negative for sore throat.   Respiratory: Negative for cough and shortness of breath.   Cardiovascular: Negative for chest pain, palpitations and leg swelling.  Gastrointestinal: Negative for abdominal pain and blood in stool.  Genitourinary: Negative for dysuria and hematuria.  Skin: Negative for rash.  Neurological: Negative for dizziness, syncope and headaches.  Hematological: Negative for adenopathy. Does not bruise/bleed easily.       Objective:   Physical Exam  Constitutional: He is oriented  to person, place, and time. He appears well-developed and well-nourished.  HENT:  Right Ear: External ear normal.  Left Ear: External ear normal.  Mouth/Throat: Oropharynx is clear and moist.  Neck: Neck supple. No thyromegaly present.  Cardiovascular: Normal rate and regular rhythm.   Pulmonary/Chest: Effort normal and breath sounds normal. No respiratory distress. He has no wheezes. He has no rales.  Musculoskeletal: He exhibits no edema.       Varicose veins bilaterally lower extremities. Right knee full range of motion. No effusion. No warmth or erythema. No medial or lateral joint line tenderness.  Lymphadenopathy:    He has no cervical adenopathy.  Neurological: He is alert and oriented to person, place, and time. No cranial nerve deficit.  Psychiatric: He has a normal mood and affect. His behavior is normal.          Assessment & Plan:  Complete physical. Tetanus booster given. Smoking cessation discussed. Labs reviewed with patient. Patient has metabolic syndrome and needs to lose weight. We'll need return for labs with repeat fasting blood sugar and A1c within one month  Persistent right knee pain. Orthopedic referral.

## 2012-03-18 ENCOUNTER — Other Ambulatory Visit (INDEPENDENT_AMBULATORY_CARE_PROVIDER_SITE_OTHER): Payer: 59

## 2012-03-18 DIAGNOSIS — E8881 Metabolic syndrome: Secondary | ICD-10-CM

## 2012-03-18 LAB — HEMOGLOBIN A1C: Hgb A1c MFr Bld: 8.7 % — ABNORMAL HIGH (ref 4.6–6.5)

## 2012-03-21 ENCOUNTER — Other Ambulatory Visit: Payer: Self-pay | Admitting: Family Medicine

## 2012-03-21 MED ORDER — METFORMIN HCL 500 MG PO TABS: 500.0000 mg | ORAL_TABLET | Freq: Two times a day (BID) | ORAL | Status: DC

## 2012-03-21 NOTE — Progress Notes (Signed)
Quick Note:  Spoke with pt- informed of lab results and dr. burchette's instructions - med called to [facility] - pt states he will call back for appt later. importance of f/u stressed to pt. ______

## 2012-05-02 ENCOUNTER — Telehealth: Payer: Self-pay | Admitting: Family Medicine

## 2012-05-02 NOTE — Telephone Encounter (Signed)
I called pt cell as requested, and had to leave a VM that i just opened his phone note at 5:30.  I informed him we have 2 openings tomorrow and he will need to call first thing in the am to get one of those times.

## 2012-05-02 NOTE — Telephone Encounter (Signed)
Patient called stating that he is having allergy issues(severe) and would like to know if he can be worked in today. Please advise. Pt cell # [phone]

## 2012-05-21 ENCOUNTER — Emergency Department (HOSPITAL_COMMUNITY)
Admission: EM | Admit: 2012-05-21 | Discharge: 2012-05-21 | Disposition: A | Discharge status: Home / Self Care | Payer: 59 | Source: Home / Self Care

## 2012-05-21 ENCOUNTER — Encounter (HOSPITAL_COMMUNITY): Payer: Self-pay | Admitting: Emergency Medicine

## 2012-05-21 DIAGNOSIS — J329 Chronic sinusitis, unspecified: Secondary | ICD-10-CM

## 2012-05-21 MED ORDER — AZITHROMYCIN 250 MG PO TABS: 250.0000 mg | ORAL_TABLET | Freq: Every day | ORAL | Status: DC

## 2012-05-21 MED ORDER — METHYLPREDNISOLONE SODIUM SUCC 125 MG IJ SOLR
125.0000 mg | Freq: Once | INTRAMUSCULAR | Status: AC
  Administered 2012-05-21: 125 mg via INTRAMUSCULAR

## 2012-05-21 MED ORDER — METHYLPREDNISOLONE SODIUM SUCC 125 MG IJ SOLR
INTRAMUSCULAR | Status: AC
  Filled 2012-05-21: qty 2

## 2012-05-21 NOTE — ED Provider Notes (Signed)
Medical screening examination/treatment/procedure(s) were performed by resident physician or non-physician practitioner and as supervising physician I was immediately available for consultation/collaboration.   KINDL,JAMES DOUGLAS MD.    James D Kindl, MD 05/21/12 1921 

## 2012-05-21 NOTE — ED Provider Notes (Signed)
History     CSN: [id number]  Arrival date & time 05/21/12  1421   None     Chief Complaint  Patient presents with  . Headache    (Consider location/radiation/quality/duration/timing/severity/associated sxs/prior treatment) Patient is a 56 y.o. male presenting with headaches. The history is provided by the patient. No language interpreter was used.  Headache The primary symptoms include headaches and nausea. The symptoms are worsening.   Pt complains of sinus congestion and drainage.  Pt has been taking zyrtec and benadryl without relief. Past Medical History  Diagnosis Date  . THRUSH 03/18/2009  . HYPERLIPIDEMIA 11/01/2008  . ASTHMA 11/08/2008  . Obstructive sleep apnea     History reviewed. No pertinent past surgical history.  Family History  Problem Relation Age of Onset  . Hyperlipidemia Other   . Heart disease Other   . Heart disease Mother 44    Arrhythmia  . Heart disease Father 88    died 64 CHF    History  Substance Use Topics  . Smoking status: Current Every Day Smoker -- 0.5 packs/day for 34 years    Types: Cigarettes  . Smokeless tobacco: Not on file  . Alcohol Use: Not on file      Review of Systems  HENT: Positive for sore throat and sinus pressure.   Gastrointestinal: Positive for nausea.  Neurological: Positive for headaches.  All other systems reviewed and are negative.    Allergies  Cephalosporins; Codeine; Gabapentin; and Penicillins  Home Medications   Current Outpatient Rx  Name Route Sig Dispense Refill  . ADVAIR DISKUS 100-50 MCG/DOSE IN AEPB  USE 1 INHALATION TWICE DAILY 60 each 5  . ASPIRIN 325 MG PO TABS Oral Take 325 mg by mouth daily.      . ATORVASTATIN CALCIUM 40 MG PO TABS Oral Take 40 mg by mouth daily.      [name] CETIRIZINE HCL 10 MG PO TABS Oral Take 10 mg by mouth daily as needed.     [name] METFORMIN HCL 500 MG PO TABS Oral Take 1 tablet (500 mg total) by mouth 2 (two) times daily with a meal. 60 tablet 3  . VITAMIN D 400  UNITS PO CAPS Oral Take 400 Units by mouth daily.      . OMEGA-3 FATTY ACIDS 1000 MG PO CAPS Oral Take 2 g by mouth daily.      [name] PROAIR HFA 108 (90 BASE) MCG/ACT IN AERS  INHALE 2 PUFFS BY MOUTH EVERY 4 HOURS AS NEEDED FOR WHEEZING 8.5 g 5    BP 125/82  Pulse 104  Temp 98.3 F (36.8 C) (Oral)  Resp 18  SpO2 97%  Physical Exam  Nursing note and vitals reviewed. Constitutional: He is oriented to person, place, and time. He appears well-developed and well-nourished.  HENT:  Head: Normocephalic and atraumatic.  Right Ear: External ear normal.  Left Ear: External ear normal.  Nose: Nose normal.  Mouth/Throat: Oropharynx is clear and moist.  Eyes: Conjunctivae normal and EOM are normal. Pupils are equal, round, and reactive to light.  Neck: Normal range of motion. Neck supple.  Cardiovascular: Normal rate and normal heart sounds.   Pulmonary/Chest: Effort normal and breath sounds normal.  Abdominal: Soft. Bowel sounds are normal.  Musculoskeletal: Normal range of motion.  Neurological: He is alert and oriented to person, place, and time.  Skin: Skin is warm.  Psychiatric: He has a normal mood and affect.    ED Course  Procedures (including critical care time)  Labs Reviewed - No data to display No results found.   No diagnosis found.    MDM  Pt request a shot of cortisone and zithromax.   Pt reports he gets this every year.   Pt counseled on monitoring glucose level.  Zithromax     Lonia Skinner [place], [place] 05/21/12 1547

## 2012-05-21 NOTE — ED Notes (Signed)
Pt c/o sinus and allergy problems x1 week... Sx include: sneezing, cough (yellow sputum), itchy eyes, frontal pain/pressure... Denies: fevers, nausea, vomiting, diarrhea... Taking Zyrtec, saline solution and benadryl w/no relief.... Hx of allergies and sinus.

## 2012-06-02 ENCOUNTER — Emergency Department (HOSPITAL_COMMUNITY): Payer: 59

## 2012-06-02 ENCOUNTER — Encounter (HOSPITAL_COMMUNITY): Payer: Self-pay | Admitting: *Deleted

## 2012-06-02 ENCOUNTER — Observation Stay (HOSPITAL_COMMUNITY)
Admission: EM | Admit: 2012-06-02 | Discharge: 2012-06-03 | Disposition: A | Discharge status: Home / Self Care | Payer: 59 | Attending: Internal Medicine | Admitting: Internal Medicine

## 2012-06-02 DIAGNOSIS — G4733 Obstructive sleep apnea (adult) (pediatric): Secondary | ICD-10-CM | POA: Diagnosis present

## 2012-06-02 DIAGNOSIS — E785 Hyperlipidemia, unspecified: Secondary | ICD-10-CM | POA: Diagnosis present

## 2012-06-02 DIAGNOSIS — J4489 Other specified chronic obstructive pulmonary disease: Secondary | ICD-10-CM | POA: Insufficient documentation

## 2012-06-02 DIAGNOSIS — J449 Chronic obstructive pulmonary disease, unspecified: Secondary | ICD-10-CM | POA: Diagnosis not present

## 2012-06-02 DIAGNOSIS — I4891 Unspecified atrial fibrillation: Principal | ICD-10-CM | POA: Diagnosis present

## 2012-06-02 DIAGNOSIS — E669 Obesity, unspecified: Secondary | ICD-10-CM | POA: Diagnosis present

## 2012-06-02 DIAGNOSIS — F17200 Nicotine dependence, unspecified, uncomplicated: Secondary | ICD-10-CM | POA: Diagnosis present

## 2012-06-02 DIAGNOSIS — I2510 Atherosclerotic heart disease of native coronary artery without angina pectoris: Secondary | ICD-10-CM | POA: Insufficient documentation

## 2012-06-02 DIAGNOSIS — R110 Nausea: Secondary | ICD-10-CM | POA: Insufficient documentation

## 2012-06-02 DIAGNOSIS — R079 Chest pain, unspecified: Secondary | ICD-10-CM

## 2012-06-02 DIAGNOSIS — Z23 Encounter for immunization: Secondary | ICD-10-CM | POA: Insufficient documentation

## 2012-06-02 DIAGNOSIS — R0602 Shortness of breath: Secondary | ICD-10-CM | POA: Insufficient documentation

## 2012-06-02 DIAGNOSIS — R61 Generalized hyperhidrosis: Secondary | ICD-10-CM | POA: Insufficient documentation

## 2012-06-02 DIAGNOSIS — E119 Type 2 diabetes mellitus without complications: Secondary | ICD-10-CM | POA: Diagnosis present

## 2012-06-02 LAB — COMPREHENSIVE METABOLIC PANEL
ALT: 36 U/L (ref 0–53)
AST: 23 U/L (ref 0–37)
Albumin: 4.2 g/dL (ref 3.5–5.2)
Alkaline Phosphatase: 90 U/L (ref 39–117)
BUN: 15 mg/dL (ref 6–23)
CO2: 24 mEq/L (ref 19–32)
Calcium: 10.3 mg/dL (ref 8.4–10.5)
Chloride: 102 mEq/L (ref 96–112)
Creatinine, Ser: 1.04 mg/dL (ref 0.50–1.35)
GFR calc Af Amer: >90 mL/min (ref >90)
GFR calc non Af Amer: 79 mL/min — ABNORMAL LOW (ref >90)
Glucose, Bld: 205 mg/dL — ABNORMAL HIGH (ref 70–99)
Potassium: 4.2 mEq/L (ref 3.5–5.1)
Sodium: 138 mEq/L (ref 135–145)
Total Bilirubin: 0.3 mg/dL (ref 0.3–1.2)
Total Protein: 7.5 g/dL (ref 6.0–8.3)

## 2012-06-02 LAB — PROTIME-INR
INR: 1.03 (ref 0.00–1.49)
Prothrombin Time: 13.4 seconds (ref 11.6–15.2)

## 2012-06-02 LAB — MAGNESIUM: Magnesium: 2 mg/dL (ref 1.5–2.5)

## 2012-06-02 LAB — CBC
HCT: 48.2 % (ref 39.0–52.0)
Hemoglobin: 17.1 g/dL — ABNORMAL HIGH (ref 13.0–17.0)
MCH: 31.3 pg (ref 26.0–34.0)
MCHC: 35.5 g/dL (ref 30.0–36.0)
MCV: 88.1 fL (ref 78.0–100.0)
Platelets: 221 10*3/uL (ref 150–400)
RBC: 5.47 MIL/uL (ref 4.22–5.81)
RDW: 12.9 % (ref 11.5–15.5)
WBC: 14.2 10*3/uL — ABNORMAL HIGH (ref 4.0–10.5)

## 2012-06-02 LAB — CK TOTAL AND CKMB (NOT AT ARMC)
CK, MB: 3 ng/mL (ref 0.3–4.0)
Relative Index: 2 (ref 0.0–2.5)
Total CK: 153 U/L (ref 7–232)

## 2012-06-02 LAB — TROPONIN I: Troponin I: <0.3 ng/mL (ref <0.30)

## 2012-06-02 MED ORDER — ASPIRIN 81 MG PO CHEW
324.0000 mg | CHEWABLE_TABLET | Freq: Once | ORAL | Status: AC
  Administered 2012-06-02: 324 mg via ORAL
  Filled 2012-06-02: qty 4

## 2012-06-02 MED ORDER — DILTIAZEM HCL 25 MG/5ML IV SOLN
20.0000 mg | Freq: Once | INTRAVENOUS | Status: AC
  Administered 2012-06-02: 20 mg via INTRAVENOUS
  Filled 2012-06-02: qty 5

## 2012-06-02 NOTE — ED Notes (Signed)
MD at bedside. 

## 2012-06-02 NOTE — ED Notes (Signed)
Patient states that she at a banana and all of a sudden felt like his chest was fluttering.  He felt tingling at his feet and became slight nauseated with lightheadedness.  Patient describes the chest pain as burning on his left chest more fluttering than anything

## 2012-06-02 NOTE — ED Provider Notes (Signed)
History     CSN: [id number]  Arrival date & time 06/02/12  2254   First MD Initiated Contact with Patient 06/02/12 2308      Chief Complaint  Patient presents with  . Chest Pain    (Consider location/radiation/quality/duration/timing/severity/associated sxs/prior treatment) HPI Comments: [name] presents ambulatory with his wife for evaluation of chest discomfort and palpitations.  He states he had a mild similar feeling a few weeks ago that self resolved.  Tonight, roughly 1 hour ago, he experienced sudden onset of a rapid fluttering heart beat.  He denies overt chest pain but states there is a weird feeling within his chest.  He also reports feeling a mildly upset stomach and sweaty.  He denies any recent illnesses or stimulant use and adds that his only recent medication change was starting metformin for type II DM a month ago.  He has a cardiac stent that was placed in 1996 after having an abnormal exercise stress test.  He continues to smoke.   The history is provided by the patient. No language interpreter was used.    Past Medical History  Diagnosis Date  . THRUSH 03/18/2009  . HYPERLIPIDEMIA 11/01/2008  . ASTHMA 11/08/2008  . Obstructive sleep apnea     Past Surgical History  Procedure Date  . Carotid stent     Family History  Problem Relation Age of Onset  . Hyperlipidemia Other   . Heart disease Other   . Heart disease Mother 60    Arrhythmia  . Heart disease Father 58    died 80 CHF    History  Substance Use Topics  . Smoking status: Current Every Day Smoker -- 0.5 packs/day for 34 years    Types: Cigarettes  . Smokeless tobacco: Not on file  . Alcohol Use: No      Review of Systems  Constitutional: Positive for diaphoresis. Negative for fever, chills, activity change, appetite change and fatigue.  HENT: Negative for congestion, sore throat, rhinorrhea, trouble swallowing, neck pain and neck stiffness.   Eyes: Negative.   Respiratory: Positive for  chest tightness and shortness of breath. Negative for cough.   Cardiovascular: Positive for palpitations. Negative for chest pain and leg swelling.  Gastrointestinal: Positive for nausea. Negative for vomiting, abdominal pain and diarrhea.  Genitourinary: Negative.   Musculoskeletal: Negative.   Skin: Negative.   Neurological: Negative.   Psychiatric/Behavioral: Negative.     Allergies  Cephalosporins; Codeine; Gabapentin; and Penicillins  Home Medications   Current Outpatient Rx  Name Route Sig Dispense Refill  . ADVAIR DISKUS 100-50 MCG/DOSE IN AEPB  USE 1 INHALATION TWICE DAILY 60 each 5  . ASPIRIN 325 MG PO TABS Oral Take 325 mg by mouth daily.      . ATORVASTATIN CALCIUM 40 MG PO TABS Oral Take 40 mg by mouth daily.      . AZITHROMYCIN 250 MG PO TABS Oral Take 1 tablet (250 mg total) by mouth daily. Take first 2 tablets together, then 1 every day until finished. 6 tablet 0  . AZITHROMYCIN 250 MG PO TABS Oral Take 1 tablet (250 mg total) by mouth daily. Take first 2 tablets together, then 1 every day until finished. 6 tablet 0  . CETIRIZINE HCL 10 MG PO TABS Oral Take 10 mg by mouth daily as needed.     [name] VITAMIN D 400 UNITS PO CAPS Oral Take 400 Units by mouth daily.      . OMEGA-3 FATTY ACIDS 1000 MG PO CAPS  Oral Take 2 g by mouth daily.      [name] METFORMIN HCL 500 MG PO TABS Oral Take 1 tablet (500 mg total) by mouth 2 (two) times daily with a meal. 60 tablet 3  . PROAIR HFA 108 (90 BASE) MCG/ACT IN AERS  INHALE 2 PUFFS BY MOUTH EVERY 4 HOURS AS NEEDED FOR WHEEZING 8.5 g 5    BP 137/96  Pulse 83  Temp 97.9 F (36.6 C) (Oral)  Resp 16  SpO2 97%  Physical Exam  Nursing note and vitals reviewed. Constitutional: He is oriented to person, place, and time. He appears well-developed and well-nourished. No distress.       Pt appears mildly anxious and uncomfortable.  HENT:  Head: Normocephalic and atraumatic.  Right Ear: External ear normal.  Left Ear: External ear normal.    Nose: Nose normal.  Mouth/Throat: Oropharynx is clear and moist. No oropharyngeal exudate.  Eyes: Conjunctivae normal are normal. Pupils are equal, round, and reactive to light. Right eye exhibits no discharge. Left eye exhibits no discharge. No scleral icterus.  Neck: Normal range of motion. Neck supple. No JVD present. No tracheal deviation present.  Cardiovascular: Normal heart sounds, intact distal pulses and normal pulses.  An irregularly irregular rhythm present. Tachycardia present.  Exam reveals no distant heart sounds, no friction rub and no decreased pulses.   No murmur heard. Pulmonary/Chest: Effort normal and breath sounds normal. No stridor. No respiratory distress. He has no wheezes. He has no rales. He exhibits no tenderness.  Abdominal: Soft. Bowel sounds are normal. He exhibits no distension and no mass. There is no tenderness. There is no rebound and no guarding.  Musculoskeletal: Normal range of motion. He exhibits no edema.  Lymphadenopathy:    He has no cervical adenopathy.  Neurological: He is alert and oriented to person, place, and time.  Skin: Skin is warm. No rash noted. He is diaphoretic. No cyanosis or erythema. No pallor. Nails show no clubbing.  Psychiatric: He has a normal mood and affect. His behavior is normal.    ED Course  Procedures (including critical care time)   Labs Reviewed  CBC  COMPREHENSIVE METABOLIC PANEL  MAGNESIUM  CK TOTAL AND CKMB  TROPONIN I   No results found.   No diagnosis found.   Date: 06/02/2012  Rate: 168 bpm  Rhythm: atrial fibrillation  QRS Axis: indeterminate  Intervals: normal (absent PR int)  ST/T Wave abnormalities: nonspecific ST changes - diffusely  Conduction Disutrbances:none  Narrative Interpretation: afib + RVR, possibly rate related ST changes  Old EKG Reviewed: changes noted     MDM  Pt presents c/o palpitations and vague chest discomfort.  He appears anxious, mildly diaphoretic, and the EKG and  monitor demonstrate afib + RVR.  He has a known hx of CAD.  Note nl BP.  Plan aspirin, cardizem bolus.  Will obtain routine cardiac labs, enzymes, and CXR.  Will monitor closely, repeat cardizem bolus if needed, and initiate gtt if RVR proves to be refractory.  Will also administer lovenox if noted nl coags.  0008.  Repeat EKG.  Rate 101 bpm, afib.  Nl axis.  No ST elevation or depression.  Pt has now received 2 boluses of cardizem.  Still reports feeling significant fluttering.  Rate has improved considerably.  Awaiting lab results prior to calling for admission.  1610.  Rate improved.  Discomfort improved.  Lovenox ordered.  Note mild leukocytosis, nl BMP and serum magnesium.  Discussed with the on-call  hospitalist.  Plan admit.  CRITICAL CARE Performed by: Dana Allan T   Total critical care time: 30+  Critical care time was exclusive of separately billable procedures and treating other patients.  Critical care was necessary to treat or prevent imminent or life-threatening deterioration.  Critical care was time spent personally by me on the following activities: development of treatment plan with patient and/or surrogate as well as nursing, discussions with consultants, evaluation of patient's response to treatment, examination of patient, obtaining history from patient or surrogate, ordering and performing treatments and interventions, ordering and review of laboratory studies, ordering and review of radiographic studies, pulse oximetry and re-evaluation of patient's condition.       Tobin Chad, MD 06/03/12 [phone]

## 2012-06-03 DIAGNOSIS — R079 Chest pain, unspecified: Secondary | ICD-10-CM

## 2012-06-03 DIAGNOSIS — J449 Chronic obstructive pulmonary disease, unspecified: Secondary | ICD-10-CM | POA: Diagnosis not present

## 2012-06-03 DIAGNOSIS — I4891 Unspecified atrial fibrillation: Secondary | ICD-10-CM | POA: Diagnosis present

## 2012-06-03 DIAGNOSIS — E119 Type 2 diabetes mellitus without complications: Secondary | ICD-10-CM | POA: Diagnosis present

## 2012-06-03 DIAGNOSIS — E669 Obesity, unspecified: Secondary | ICD-10-CM | POA: Diagnosis present

## 2012-06-03 DIAGNOSIS — F17200 Nicotine dependence, unspecified, uncomplicated: Secondary | ICD-10-CM | POA: Diagnosis present

## 2012-06-03 LAB — CBC
HCT: 44.9 % (ref 39.0–52.0)
Hemoglobin: 15.8 g/dL (ref 13.0–17.0)
MCH: 30.9 pg (ref 26.0–34.0)
MCHC: 35.2 g/dL (ref 30.0–36.0)
MCV: 87.9 fL (ref 78.0–100.0)
Platelets: 209 10*3/uL (ref 150–400)
RBC: 5.11 MIL/uL (ref 4.22–5.81)
RDW: 12.8 % (ref 11.5–15.5)
WBC: 13.9 10*3/uL — ABNORMAL HIGH (ref 4.0–10.5)

## 2012-06-03 LAB — BASIC METABOLIC PANEL
BUN: 16 mg/dL (ref 6–23)
CO2: 24 mEq/L (ref 19–32)
Calcium: 9.3 mg/dL (ref 8.4–10.5)
Chloride: 105 mEq/L (ref 96–112)
Creatinine, Ser: 0.96 mg/dL (ref 0.50–1.35)
GFR calc Af Amer: >90 mL/min (ref >90)
GFR calc non Af Amer: >90 mL/min (ref >90)
Glucose, Bld: 123 mg/dL — ABNORMAL HIGH (ref 70–99)
Potassium: 4.2 mEq/L (ref 3.5–5.1)
Sodium: 139 mEq/L (ref 135–145)

## 2012-06-03 LAB — TSH: TSH: 1.816 u[IU]/mL (ref 0.350–4.500)

## 2012-06-03 LAB — TROPONIN I
Troponin I: <0.3 ng/mL (ref <0.30)
Troponin I: <0.3 ng/mL (ref <0.30)

## 2012-06-03 LAB — GLUCOSE, CAPILLARY: Glucose-Capillary: 102 mg/dL — ABNORMAL HIGH (ref 70–99)

## 2012-06-03 LAB — PROTIME-INR
INR: 1.09 (ref 0.00–1.49)
Prothrombin Time: 14 seconds (ref 11.6–15.2)

## 2012-06-03 LAB — T4, FREE: Free T4: 1.14 ng/dL (ref 0.80–1.80)

## 2012-06-03 MED ORDER — INFLUENZA VIRUS VACC SPLIT PF IM SUSP
0.5000 mL | INTRAMUSCULAR | Status: AC
  Administered 2012-06-03: 0.5 mL via INTRAMUSCULAR
  Filled 2012-06-03: qty 0.5

## 2012-06-03 MED ORDER — NITROGLYCERIN 0.4 MG SL SUBL: 0.4000 mg | SUBLINGUAL_TABLET | SUBLINGUAL | Status: DC | PRN

## 2012-06-03 MED ORDER — ASPIRIN 325 MG PO TABS
325.0000 mg | ORAL_TABLET | Freq: Every day | ORAL | Status: DC
  Administered 2012-06-03: 325 mg via ORAL
  Filled 2012-06-03 (×2): qty 1

## 2012-06-03 MED ORDER — ACETAMINOPHEN 325 MG PO TABS: 650.0000 mg | ORAL_TABLET | Freq: Four times a day (QID) | ORAL | Status: DC | PRN

## 2012-06-03 MED ORDER — METFORMIN HCL 500 MG PO TABS
500.0000 mg | ORAL_TABLET | Freq: Two times a day (BID) | ORAL | Status: DC
  Administered 2012-06-03: 500 mg via ORAL
  Filled 2012-06-03 (×3): qty 1

## 2012-06-03 MED ORDER — ACETAMINOPHEN 650 MG RE SUPP: 650.0000 mg | Freq: Four times a day (QID) | RECTAL | Status: DC | PRN

## 2012-06-03 MED ORDER — SODIUM CHLORIDE 0.9 % IJ SOLN
3.0000 mL | Freq: Two times a day (BID) | INTRAMUSCULAR | Status: DC
  Administered 2012-06-03: 3 mL via INTRAVENOUS

## 2012-06-03 MED ORDER — ENOXAPARIN SODIUM 150 MG/ML ~~LOC~~ SOLN
130.0000 mg | Freq: Once | SUBCUTANEOUS | Status: AC
  Administered 2012-06-03: 130 mg via SUBCUTANEOUS
  Filled 2012-06-03: qty 1

## 2012-06-03 MED ORDER — ENOXAPARIN SODIUM 120 MG/0.8ML ~~LOC~~ SOLN
1.0000 mg/kg | Freq: Two times a day (BID) | SUBCUTANEOUS | Status: DC
  Filled 2012-06-03 (×2): qty 0.8

## 2012-06-03 MED ORDER — OFF THE BEAT BOOK
Freq: Once | Status: AC
  Administered 2012-06-03: 11:00:00
  Filled 2012-06-03: qty 1

## 2012-06-03 MED ORDER — WARFARIN VIDEO: Freq: Once | Status: DC

## 2012-06-03 MED ORDER — WARFARIN - PHARMACIST DOSING INPATIENT: Freq: Every day | Status: DC

## 2012-06-03 MED ORDER — SODIUM CHLORIDE 0.9 % IJ SOLN: 3.0000 mL | INTRAMUSCULAR | Status: DC | PRN

## 2012-06-03 MED ORDER — DILTIAZEM HCL 30 MG PO TABS
30.0000 mg | ORAL_TABLET | Freq: Four times a day (QID) | ORAL | Status: DC
  Administered 2012-06-03 (×2): 30 mg via ORAL
  Filled 2012-06-03 (×6): qty 1

## 2012-06-03 MED ORDER — FLUTICASONE-SALMETEROL 100-50 MCG/DOSE IN AEPB
1.0000 | INHALATION_SPRAY | Freq: Two times a day (BID) | RESPIRATORY_TRACT | Status: DC
  Administered 2012-06-03: 1 via RESPIRATORY_TRACT
  Filled 2012-06-03: qty 14

## 2012-06-03 MED ORDER — ASPIRIN 81 MG PO CHEW: 81.0000 mg | CHEWABLE_TABLET | Freq: Every day | ORAL | Status: DC

## 2012-06-03 MED ORDER — INSULIN ASPART 100 UNIT/ML ~~LOC~~ SOLN: 0.0000 [IU] | Freq: Every day | SUBCUTANEOUS | Status: DC

## 2012-06-03 MED ORDER — INSULIN ASPART 100 UNIT/ML ~~LOC~~ SOLN: 0.0000 [IU] | Freq: Three times a day (TID) | SUBCUTANEOUS | Status: DC

## 2012-06-03 MED ORDER — ATORVASTATIN CALCIUM 40 MG PO TABS
40.0000 mg | ORAL_TABLET | Freq: Every day | ORAL | Status: DC
  Administered 2012-06-03: 40 mg via ORAL
  Filled 2012-06-03: qty 1

## 2012-06-03 MED ORDER — SODIUM CHLORIDE 0.9 % IJ SOLN: 3.0000 mL | Freq: Two times a day (BID) | INTRAMUSCULAR | Status: DC

## 2012-06-03 MED ORDER — SODIUM CHLORIDE 0.9 % IV SOLN: 250.0000 mL | INTRAVENOUS | Status: DC | PRN

## 2012-06-03 MED ORDER — WARFARIN SODIUM 10 MG PO TABS
10.0000 mg | ORAL_TABLET | Freq: Once | ORAL | Status: DC
  Filled 2012-06-03: qty 1

## 2012-06-03 MED ORDER — DILTIAZEM HCL ER COATED BEADS 120 MG PO CP24: 120.0000 mg | ORAL_CAPSULE | Freq: Every day | ORAL | Status: DC

## 2012-06-03 MED ORDER — COUMADIN BOOK
Freq: Once | Status: DC
  Filled 2012-06-03: qty 1

## 2012-06-03 NOTE — Discharge Summary (Signed)
Physician Discharge Summary  Patient ID: Eric Palmer MRN: [id number] DOB/AGE: 11/10/1955 56 y.o.  Admit date: 06/02/2012 Discharge date: 06/03/2012  Primary Care Physician:  Kristian Covey, MD Cardiologist: Dr.Henry Katrinka Blazing  Disposition and Follow-up:  Dr.Smith  On 10/21 ECHO at [facility] cardiology 10/21 prior to appointment Dr.Burchette in 1-2 weeks    Discharge Diagnoses:    Atrial Fibrillation with RVR  HYPERLIPIDEMIA  OBSTRUCTIVE SLEEP APNEA  CORONARY ARTERY DISEASE non obstructive disease  COPD (chronic obstructive pulmonary disease)  Type II or unspecified type diabetes mellitus without mention of complication, not stated as uncontrolled  Tobacco use disorder  Obesity, unspecified      Medication List     As of 06/03/2012 10:14 AM    STOP taking these medications         azithromycin 250 MG tablet   Commonly known as: ZITHROMAX      TAKE these medications         albuterol 108 (90 BASE) MCG/ACT inhaler   Commonly known as: PROVENTIL HFA;VENTOLIN HFA   Inhale 2 puffs into the lungs every 6 (six) hours as needed. For breathing      aspirin 325 MG tablet   Take 325 mg by mouth daily.      atorvastatin 40 MG tablet   Commonly known as: LIPITOR   Take 40 mg by mouth daily.      cetirizine 10 MG tablet   Commonly known as: ZYRTEC   Take 10 mg by mouth daily as needed.      diltiazem 120 MG 24 hr capsule   Commonly known as: CARDIZEM CD   Take 1 capsule (120 mg total) by mouth daily.      fish oil-omega-3 fatty acids 1000 MG capsule   Take 2 g by mouth daily.      Fluticasone-Salmeterol 100-50 MCG/DOSE Aepb   Commonly known as: ADVAIR   Inhale 1 puff into the lungs every 12 (twelve) hours.      metFORMIN 500 MG tablet   Commonly known as: GLUCOPHAGE   Take 1 tablet (500 mg total) by mouth 2 (two) times daily with a meal.          Consults:  Dr.Skains, [facility]   Significant Diagnostic Studies:  Dg Chest Portable 1  View  06/03/2012  *RADIOLOGY REPORT*  Clinical Data: 56 year old male chest pain, irregular heart rate. Hypertension.  PORTABLE CHEST - 1 VIEW  Comparison: 01/02/2011 and earlier.  Findings: Portable semi upright AP view 2353 hours.  Resuscitation pad artifact in the left upper chest.  EKG leads and wires elsewhere.  Lower lung volumes.  Mild streaky opacity at the left base most resembles atelectasis.  Cardiac size and mediastinal contours are within normal limits.  Visualized tracheal air column is within normal limits.  No pneumothorax or pulmonary edema.  No pleural effusion.  IMPRESSION: Mild left basilar atelectasis.   Original Report Authenticated By: Harley Hallmark, M.D.     Brief H and P: This is a 56 year old gentleman who states at approximately 10:30 this evening he developed a sensation of fluttering in his chest. He when on to develop a pinpoint sharp stabbing pain that never radiated. He had tingling in his left arm and his feet. he reports feeling somewhat lightheaded and nauseous but not short of breath. His wife brought him to the ER. In the ER he was noted to have atrial fibrillation with a rate as high as 170. He's received several boluses of Cardizem, his  heart rate is noted though 100s. His chest discomfort has relieved. The hospitalist was called with request to admit. Of note the patient states he had a similar episode approximately week ago that spontaneously resolved.    Hospital Course:   1.  Afib with RVR : presented to the ER with chest fluttering/Palpitations, had similar symptoms 2 weeks ago Received multiple boluses of IV cardizem in the ER and was started on Po cardizem subsequently  autoconverted to NSR, HR <90  CHADs2 score is low at 1, due to DM, hence it was felt to be reasonable to continue ASA 325mg  Daily Was seen by Dr.Skains in consultation , He had been receiving by mouth Cardizem 30 mg by mouth every 6, hence converted to Cardizem CD 120 mg once a day.  Dr.Skains instructed him that if his rapid atrial fibrillation occurs once again, he may take an extra Cardizem, relax. If he does not pass to please seek medical attention. He scheduled him for 2D echo as outpatient on 10/21 and MD follow up TSH was normal at 1.8  2. DM: continue metformin, SSI   3. CAD/non obstructive disease based on cath 07, had stress test since which per patient report were ok     Time spent on Discharge:  Signed: Docie Abramovich [facility]  06/03/2012, 10:14 AM

## 2012-06-03 NOTE — Progress Notes (Signed)
ANTICOAGULATION CONSULT NOTE - Initial Consult  Pharmacy Consult for Coumadin Indication: atrial fibrillation  Allergies  Allergen Reactions  . Cephalosporins   . Codeine   . Gabapentin     "made me feel drugged"  . Penicillins Hives    Patient Measurements: Height: 6\' 1"  (185.4 cm) Weight: 257 lb (116.574 kg) IBW/kg (Calculated) : 79.9   Vital Signs: Temp: 97.9 F (36.6 C) (10/10 2258) Temp src: Oral (10/10 2258) BP: 106/65 mmHg (10/11 0030) Pulse Rate: 97  (10/11 0030)  Labs:  Basename 06/02/12 2325 06/02/12 2310 06/02/12 2309  HGB -- -- 17.1*  HCT -- -- 48.2  PLT -- -- 221  APTT -- -- --  LABPROT 13.4 -- --  INR 1.03 -- --  HEPARINUNFRC -- -- --  CREATININE -- -- 1.04  CKTOTAL -- 153 --  CKMB -- 3.0 --  TROPONINI -- <0.30 --    Estimated Creatinine Clearance: 107.4 ml/min (by C-G formula based on Cr of 1.04).   Medical History: Past Medical History  Diagnosis Date  . THRUSH 03/18/2009  . HYPERLIPIDEMIA 11/01/2008  . ASTHMA 11/08/2008  . Obstructive sleep apnea     Assessment: 56yo male c/o chest discomfort and palpitations, found to be in Afib, to begin full anticoagulation; admitting MD has begun Lovenox, pharmacy consulted to monitor Coumadin dosing.  Goal of Therapy:  INR 2-3   Plan:  Will give Coumadin 10mg  po x1 today and monitor INR for dose adjustments.  Colleen Can PharmD BCPS 06/03/2012,1:15 AM

## 2012-06-03 NOTE — Progress Notes (Signed)
Pt seen and examined Admitted this am with  1. Afib with RVR Received multiple boluses of IV cardizem in the ER and was started on Po cardizem subsequently converted to NSR, HR <90 DOes not want to start anticoagulation which was ordered by admitter But CHADs2 score is low at 1, due to DM, so could be ok with ASA 325mg  or newer anticoagulants like Pradaxa/Xarelto or Eliquis could be considered Wll check 2D ECHo TSH/T4 Will request eval per Dr.Smith or associates  2. DM: continue metformin, SSI  3. CAD/non obstructive disease based on cath 07, had stress test since which per patient report were ok   Zannie Cove, MD [phone]

## 2012-06-03 NOTE — Progress Notes (Signed)
Pt admitted with A-FIb  RVR, he converted to Sinus rhythm at 0251 am, will continue to monitor.

## 2012-06-03 NOTE — Consult Note (Signed)
Admit date: 06/02/2012 Referring Physician  : Dr. Jomarie Longs Primary Physician Kristian Covey, MD Primary Cardiologist  Dr. Katrinka Blazing Reason for Consultation  atrial fibrillation  HPI: 56 year old male with coronary artery disease last catheterization in 2007 showing moderate disease in his LAD, shepherd's crook right coronary artery, per patient previously placed stent in that artery in the 1990s, with DM (A1c 8.7) on metformin, no hypertension, no prior strokes, no congestive heart failure here with atrial fibrillation rapid ventricular response. Approximately 2 weeks ago he had a short episode lasting a few minutes duration of rapid heartbeat. He felt fluttering in his chest. He thought that he may be associated with severe seasonal allergies and he received prednisone at that time. Last night however he had another episode of rapid heart rate which worried him. He felt fluttering in his chest, numbness in his feet, and some numbness in his left hand. Perhaps a little bit of chest discomfort was discussed but not severe. He felt some nausea. He felt unsettled. Because his heart was not slowing down, he decided to come in to the emergency department. His white blood cell count was minimally elevated, chest x-ray was normal with no evidence of pneumonia. He showed no fever, cough, rashes, dysphasia, strokelike symptoms. No recent surgeries. He is a smoker. He works at [employer]. Dr. Sander Radon has been working with him to lose weight and he states that he has lost approximately 25 pounds. He was recently placed on metformin so this was motivation for him to lose weight to decrease his blood glucose.     PMH:   Past Medical History  Diagnosis Date  . THRUSH 03/18/2009  . HYPERLIPIDEMIA 11/01/2008  . ASTHMA 11/08/2008  . Obstructive sleep apnea     PSH:   Past Surgical History  Procedure Date  . Carotid stent    Allergies:  Cephalosporins; Codeine; Gabapentin; and Penicillins Prior to Admit Meds:     Prescriptions prior to admission  Medication Sig Dispense Refill  . albuterol (PROVENTIL HFA;VENTOLIN HFA) 108 (90 BASE) MCG/ACT inhaler Inhale 2 puffs into the lungs every 6 (six) hours as needed. For breathing      . aspirin 325 MG tablet Take 325 mg by mouth daily.        [name] atorvastatin (LIPITOR) 40 MG tablet Take 40 mg by mouth daily.        . cetirizine (ZYRTEC) 10 MG tablet Take 10 mg by mouth daily as needed.       . fish oil-omega-3 fatty acids 1000 MG capsule Take 2 g by mouth daily.        . Fluticasone-Salmeterol (ADVAIR) 100-50 MCG/DOSE AEPB Inhale 1 puff into the lungs every 12 (twelve) hours.      . metFORMIN (GLUCOPHAGE) 500 MG tablet Take 1 tablet (500 mg total) by mouth 2 (two) times daily with a meal.  60 tablet  3  . azithromycin (ZITHROMAX) 250 MG tablet Take 1 tablet (250 mg total) by mouth daily. Take first 2 tablets together, then 1 every day until finished.  6 tablet  0   Fam HX:    Family History  Problem Relation Age of Onset  . Hyperlipidemia Other   . Heart disease Other   . Heart disease Mother 33    Arrhythmia  . Heart disease Father 29    died 30 CHF   Social HX:    History   Social History  . Marital Status: Married    Spouse Name: N/A  Number of Children: N/A  . Years of Education: N/A   Occupational History  . Not on file.   Social History Main Topics  . Smoking status: Current Every Day Smoker -- 0.5 packs/day for 34 years    Types: Cigarettes  . Smokeless tobacco: Not on file  . Alcohol Use: No  . Drug Use: Not on file  . Sexually Active: Not on file   Other Topics Concern  . Not on file   Social History Narrative  . No narrative on file     ROS:  All 11 ROS were addressed and are negative except what is stated in the HPI  Physical Exam: Blood pressure 116/62, pulse 81, temperature 97.8 F (36.6 C), temperature source Oral, resp. rate 18, height 6\' 1"  (1.854 m), weight 119.6 kg (263 lb 10.7 oz), SpO2 96.00%.    General:  Well developed, well nourished, in no acute distress Head: Eyes PERRLA, No xanthomas.   Normal cephalic and atramatic  Lungs:   Clear bilaterally to auscultation and percussion. Normal respiratory effort. No wheezes, no rales. Heart:   HRRR S1 S2 Pulses are 2+ & equal. No M/R/G            No carotid bruit. No JVD.  No abdominal bruits. No femoral bruits. Abdomen: Bowel sounds are positive, abdomen soft and non-tender without masses. No hepatosplenomegaly. Msk:  Back normal. Normal strength and tone for age. Extremities:   No clubbing, cyanosis or edema.  DP +1 Neuro: Alert and oriented X 3, non-focal, MAE x 4 GU: Deferred Rectal: Deferred Psych:  Good affect, responds appropriately    Labs:   Lab Results  Component Value Date   WBC 13.9* 06/03/2012   HGB 15.8 06/03/2012   HCT 44.9 06/03/2012   MCV 87.9 06/03/2012   PLT 209 06/03/2012    Lab 06/03/12 0220 06/02/12 2309  NA 139 --  K 4.2 --  CL 105 --  CO2 24 --  BUN 16 --  CREATININE 0.96 --  CALCIUM 9.3 --  PROT -- 7.5  BILITOT -- 0.3  ALKPHOS -- 90  ALT -- 36  AST -- 23  GLUCOSE 123* --   No results found for this basename: PTT   Lab Results  Component Value Date   INR 1.09 06/03/2012   INR 1.03 06/02/2012   INR 1.1 05/13/2009   Lab Results  Component Value Date   CKTOTAL 153 06/02/2012   CKMB 3.0 06/02/2012   TROPONINI <0.30 06/03/2012     Lab Results  Component Value Date   CHOL 115 01/21/2012   CHOL 114 11/01/2008   Lab Results  Component Value Date   HDL 30.40* 01/21/2012   HDL 28.2* 11/01/2008   Lab Results  Component Value Date   LDLCALC 46 11/01/2008   Lab Results  Component Value Date   TRIG 284.0* 01/21/2012   TRIG 198* 11/01/2008   Lab Results  Component Value Date   CHOLHDL 4 01/21/2012   CHOLHDL 4.0 CALC 11/01/2008   Lab Results  Component Value Date   LDLDIRECT 44.4 01/21/2012      Radiology:  Dg Chest Portable 1 View  06/03/2012  *RADIOLOGY REPORT*  Clinical Data: 56 year old  male chest pain, irregular heart rate. Hypertension.  PORTABLE CHEST - 1 VIEW  Comparison: 01/02/2011 and earlier.  Findings: Portable semi upright AP view 2353 hours.  Resuscitation pad artifact in the left upper chest.  EKG leads and wires elsewhere.  Lower lung volumes.  Mild streaky  opacity at the left base most resembles atelectasis.  Cardiac size and mediastinal contours are within normal limits.  Visualized tracheal air column is within normal limits.  No pneumothorax or pulmonary edema.  No pleural effusion.  IMPRESSION: Mild left basilar atelectasis.   Original Report Authenticated By: Harley Hallmark, M.D.    Personally viewed.  EKG:  AFIB with RVR 168bpm, NSSTW changes. Telemetry currently shows normal sinus rhythm. Personally viewed.   DATE OF PROCEDURE: 02/01/2006  CARDIAC CATHETERIZATION  INDICATION: The patient was admitted to the hospital on January 30, 2006 after  experiencing at least 2 episodes of chest discomfort and arm numbness and  tingling. He states that during one of the episodes his wife felt his  pulse, and his pulse was rapid. He did not feel palpitations. He felt as  though he may faint or go out, however, during each episode. He has had no  recurrence since hospitalization.  PROCEDURE PERFORMED:  1. Left heart catheterization.  2. Selective coronary angiography.  3. Left ventriculography.  DESCRIPTION: After informed consent, a 6-French sheath was placed in the  right femoral artery using the modified Seldinger technique. A 6-French A2  multipurpose catheter was used for hemodynamic recordings, left  ventriculography by hand injection, and selective left and right coronary  angiography. Angio-Seal arteriotomy closure was performed post-procedure.  The patient tolerated the procedure without complications.  RESULTS:  1. Hemodynamic data.  1. Aortic pressure 95/58.  2. Left ventricular pressure 98/18.  2. Left ventriculography: Left ventricular cavity size is  normal. There  is some mild hypokinesis of the anterior wall. Overall EF is normal at  50%. No mitral regurgitation.  3. Coronary angiography.  1. Left main coronary: Widely patent.  2. Left anterior descending coronary: The LAD wraps around the apex.  It gives origin to one large diagonal. The LAD is widely patent  with the exception of a kink region in the mid vessel that also  contains 50% narrowing. The first diagonal in its mid portion  contains an eccentric 40% narrowing. When compared to the prior  angiogram performed in 2005, no change has occurred.  3. Circumflex artery: Widely patent. Three obtuse marginals also  normal.  4. Right coronary: The right coronary artery is a shepherd's crook  vessel with tortuosity, minimal luminal irregularities, large PDA  and left ventricular branch. No significant obstruction is seen.  CONCLUSIONS:  1. Stable 50% mid-LAD stenosis, unchanged from 2 years ago.  2. Otherwise widely patent coronaries with minimal luminal irregularities.  3. Overall normal LV function with mild anterior wall hypokinesis.  PLAN: Continue current medical regimen. If the patient has recurrent  episodes of this type of symptom, I would consider event monitoring to rule  out arrhythmia. Will plan to hopefully discharge the patient later on  today.  Lyn Records, M.D.  Electronically Signed    ASSESSMENT/PLAN:   56 year old male with paroxysmal atrial fibrillation with rapid ventricular response, elevated blood glucose, moderate coronary artery disease-nonflow limiting, prior RCA stent per patient in the 1990s, smoker, obesity.  1. Atrial fibrillation-after receiving several doses of IV diltiazem bolus, he auto converted to sinus rhythm. She has been receiving by mouth Cardizem 30 mg by mouth every 6. I think it is reasonable for him to be discharged on Cardizem CD 120 mg once a day. I have instructed him that if his rapid atrial fibrillation occurs once again, he may  take an extra Cardizem, relax. If he does not pass please seek  medical attention.  I have set him up for an echocardiogram as an outpatient. TSH at the time of this dictation is pending. I do not see any reversible causes for his atrial fibrillation at this point. He is a nondrinker. No recent fevers. No surgeries. I counseled him that atrial fibrillation is likely to return. I stated that he may need to incorporate antiarrhythmic therapy in the future.  CHADS2 score is one for diabetes-hemoglobin A1c 8.7 on 03/18/12. He is nonhypertensive. No prior strokes. Aspirin therapy alone is reasonable. We discussed anticoagulation as well. He chooses aspirin.  2. Obesity-continue weight loss. Congratulated him on his 20 pound weight loss.  3. Tobacco use-encourage tobacco cessation.  4. Coronary artery disease-moderate disease. Continue to monitor clinically. No indication at this time for cardiac catheterization.  Donato Schultz, MD  06/03/2012  8:51 AM

## 2012-06-03 NOTE — Care Management Note (Signed)
    Page 1 of 1   06/03/2012     1:54:24 PM   CARE MANAGEMENT NOTE 06/03/2012  Patient:  Eric Palmer, Eric Palmer   Account Number:  [id number]  Date Initiated:  06/03/2012  Documentation initiated by:  Zeffie Bickert  Subjective/Objective Assessment:   PT ADM ON 06/02/12 WITH AFIB WITH RVR, NOW RESOLVED.  PTA, PT INDEPENDENT, LIVES WITH SPOUSE.     Action/Plan:   WIFE TO PROVIDE CARE AT DISCHARGE.  WILL FOLLOW FOR HOME NEEDS AS PT PROGRESSES.   Anticipated DC Date:  06/03/2012   Anticipated DC Plan:  HOME/SELF CARE      DC Planning Services  CM consult      Choice offered to / List presented to:             Status of service:  Completed, signed off Medicare Important Message given?   (If response is "NO", the following Medicare IM given date fields will be blank) Date Medicare IM given:   Date Additional Medicare IM given:    Discharge Disposition:  HOME/SELF CARE  Per UR Regulation:  Reviewed for med. necessity/level of care/duration of stay  If discussed at Long Length of Stay Meetings, dates discussed:    Comments:

## 2012-06-03 NOTE — Progress Notes (Signed)
Utilization Review Completed.Eric Palmer T10/06/2012   

## 2012-06-03 NOTE — H&P (Signed)
PCP:   Kristian Covey, MD  Cardiologist: Dr. Verdis Prime  Chief Complaint:  Palpitations  HPI: This is a 56 year old gentleman who states at approximately 10:30 this evening he developed a sensation of fluttering in his chest. He when on to develop a pinpoint sharp stabbing pain that never radiated. He had tingling in his left arm and his feet. he reports feeling somewhat lightheaded and nauseous but not short of breath. His wife brought him to the ER. In the ER he was noted to have atrial fibrillation with a rate as high as 170. He's received several boluses of Cardizem, his heart rate is noted though 100s. His chest discomfort has relieved. The hospitalist was called with request to admit. Of note the patient states he had a similar episode approximately week ago that spontaneously resolved.  Review of Systems:  The patient denies anorexia, fever, weight loss,, vision loss, decreased hearing, hoarseness, syncope, dyspnea on exertion, peripheral edema, balance deficits, hemoptysis, abdominal pain, melena, hematochezia, severe indigestion/heartburn, hematuria, incontinence, genital sores, muscle weakness, suspicious skin lesions, transient blindness, difficulty walking, depression, unusual weight change, abnormal bleeding, enlarged lymph nodes, angioedema, and breast masses.  Past Medical History: Past Medical History  Diagnosis Date  . THRUSH 03/18/2009  . HYPERLIPIDEMIA 11/01/2008  . ASTHMA 11/08/2008  . Obstructive sleep apnea    Past Surgical History  Procedure Date  . Carotid stent     Medications: Prior to Admission medications   Medication Sig Start Date End Date Taking? Authorizing Provider  albuterol (PROVENTIL HFA;VENTOLIN HFA) 108 (90 BASE) MCG/ACT inhaler Inhale 2 puffs into the lungs every 6 (six) hours as needed. For breathing   Yes Historical Provider, MD  aspirin 325 MG tablet Take 325 mg by mouth daily.     Yes Historical Provider, MD  atorvastatin (LIPITOR) 40 MG  tablet Take 40 mg by mouth daily.     Yes Historical Provider, MD  cetirizine (ZYRTEC) 10 MG tablet Take 10 mg by mouth daily as needed.    Yes Historical Provider, MD  fish oil-omega-3 fatty acids 1000 MG capsule Take 2 g by mouth daily.     Yes Historical Provider, MD  Fluticasone-Salmeterol (ADVAIR) 100-50 MCG/DOSE AEPB Inhale 1 puff into the lungs every 12 (twelve) hours.   Yes Historical Provider, MD  metFORMIN (GLUCOPHAGE) 500 MG tablet Take 1 tablet (500 mg total) by mouth 2 (two) times daily with a meal. 03/21/12 03/21/13 Yes Kristian Covey, MD  azithromycin (ZITHROMAX) 250 MG tablet Take 1 tablet (250 mg total) by mouth daily. Take first 2 tablets together, then 1 every day until finished. 05/21/12   Elson Areas, PA    Allergies:   Allergies  Allergen Reactions  . Cephalosporins   . Codeine   . Gabapentin     "made me feel drugged"  . Penicillins Hives    Social History:  reports that he has been smoking Cigarettes.  He has a 17 pack-year smoking history. He does not have any smokeless tobacco history on file. He reports that he does not drink alcohol. His drug history not on file.  Family History: Family History  Problem Relation Age of Onset  . Hyperlipidemia Other   . Heart disease Other   . Heart disease Mother 40    Arrhythmia  . Heart disease Father 30    died 20 CHF    Physical Exam: Filed Vitals:   06/03/12 0000 06/03/12 0015 06/03/12 0030 06/03/12 0035  BP: 126/75 112/54 106/65  Pulse: 104 120 97   Temp:      TempSrc:      Resp: 16 15 16    Height:    6\' 1"  (1.854 m)  Weight:    116.574 kg (257 lb)  SpO2: 96% 97% 96%     General:  Alert and oriented times three, well developed and nourished, no acute distress Eyes: PERRLA, pink conjunctiva, no scleral icterus ENT: Moist oral mucosa, neck supple, no thyromegaly Lungs: clear to ascultation, no wheeze, no crackles, no use of accessory muscles Cardiovascular: irregular rate and rhythm, no  regurgitation, no gallops, no murmurs. No carotid bruits, no JVD Abdomen: soft, positive BS, non-tender, non-distended, no organomegaly, not an acute abdomen GU: not examined Neuro: CN II - XII grossly intact, sensation intact Musculoskeletal: strength 5/5 all extremities, no clubbing, cyanosis or edema Skin: no rash, no subcutaneous crepitation, no decubitus Psych: appropriate patient   Labs on Admission:   [facility] 06/02/12 2309  NA 138  K 4.2  CL 102  CO2 24  GLUCOSE 205*  BUN 15  CREATININE 1.04  CALCIUM 10.3  MG 2.0  PHOS --    Basename 06/02/12 2309  AST 23  ALT 36  ALKPHOS 90  BILITOT 0.3  PROT 7.5  ALBUMIN 4.2   No results found for this basename: LIPASE:2,AMYLASE:2 in the last 72 hours  Basename 06/02/12 2309  WBC 14.2*  NEUTROABS --  HGB 17.1*  HCT 48.2  MCV 88.1  PLT 221    Basename 06/02/12 2310  CKTOTAL 153  CKMB 3.0  CKMBINDEX --  TROPONINI <0.30   No components found with this basename: POCBNP:3 No results found for this basename: DDIMER:2 in the last 72 hours No results found for this basename: HGBA1C:2 in the last 72 hours No results found for this basename: CHOL:2,HDL:2,LDLCALC:2,TRIG:2,CHOLHDL:2,LDLDIRECT:2 in the last 72 hours No results found for this basename: TSH,T4TOTAL,FREET3,T3FREE,THYROIDAB in the last 72 hours No results found for this basename: VITAMINB12:2,FOLATE:2,FERRITIN:2,TIBC:2,IRON:2,RETICCTPCT:2 in the last 72 hours  Micro Results: No results found for this or any previous visit (from the past 240 hour(s)).   Radiological Exams on Admission: Dg Chest Portable 1 View  06/03/2012  *RADIOLOGY REPORT*  Clinical Data: 56 year old male chest pain, irregular heart rate. Hypertension.  PORTABLE CHEST - 1 VIEW  Comparison: 01/02/2011 and earlier.  Findings: Portable semi upright AP view 2353 hours.  Resuscitation pad artifact in the left upper chest.  EKG leads and wires elsewhere.  Lower lung volumes.  Mild streaky opacity at  the left base most resembles atelectasis.  Cardiac size and mediastinal contours are within normal limits.  Visualized tracheal air column is within normal limits.  No pneumothorax or pulmonary edema.  No pleural effusion.  IMPRESSION: Mild left basilar atelectasis.   Original Report Authenticated By: Harley Hallmark, M.D.     EKG: A. fib with RVR  Assessment/Plan Present on Admission:  .Atrial fibrillation with RVR/chest pains .CORONARY ARTERY DISEASE Admit to telemetry Continue Cardizem by mouth, Lovenox and Coumadin ordered. Pharmacy  to manage Coumadin TSH, lipid panel, 2-D echo of the morning We'll cycle cardiac enzymes When necessary nitroglycerin ordered  .OBSTRUCTIVE SLEEP APNEA  will continue CPAP  .HYPERLIPIDEMIA Stable resume home medications   Full code DVT prophylaxis  Brayten Komar 06/03/2012, 1:13 AM

## 2012-07-01 ENCOUNTER — Other Ambulatory Visit (HOSPITAL_COMMUNITY): Payer: Self-pay | Admitting: Internal Medicine

## 2012-07-22 ENCOUNTER — Other Ambulatory Visit: Payer: Self-pay | Admitting: Family Medicine

## 2012-07-22 NOTE — Telephone Encounter (Signed)
Refill for 3 months. Needs office follow up 1-2 months.

## 2012-07-22 NOTE — Telephone Encounter (Signed)
Pt notified Rx refill done but needs follow up appt in 1-2 months per Dr. Docia Furl. Pt verbalized understanding.

## 2012-10-18 ENCOUNTER — Other Ambulatory Visit: Payer: Self-pay | Admitting: Family Medicine

## 2012-12-18 ENCOUNTER — Other Ambulatory Visit: Payer: Self-pay | Admitting: Family Medicine

## 2012-12-30 ENCOUNTER — Encounter: Payer: Self-pay | Admitting: Family Medicine

## 2012-12-30 ENCOUNTER — Ambulatory Visit (INDEPENDENT_AMBULATORY_CARE_PROVIDER_SITE_OTHER)
Admission: RE | Admit: 2012-12-30 | Discharge: 2012-12-30 | Disposition: A | Discharge status: Home / Self Care | Payer: 59 | Source: Ambulatory Visit | Attending: Family Medicine | Admitting: Family Medicine

## 2012-12-30 ENCOUNTER — Ambulatory Visit (INDEPENDENT_AMBULATORY_CARE_PROVIDER_SITE_OTHER): Payer: 59 | Admitting: Family Medicine

## 2012-12-30 VITALS — BP 140/80 | Temp 98.4°F | Wt 277.0 lb

## 2012-12-30 DIAGNOSIS — R071 Chest pain on breathing: Secondary | ICD-10-CM

## 2012-12-30 DIAGNOSIS — R0789 Other chest pain: Secondary | ICD-10-CM

## 2012-12-30 MED ORDER — METHYLPREDNISOLONE ACETATE 80 MG/ML IJ SUSP
80.0000 mg | Freq: Once | INTRAMUSCULAR | Status: AC
  Administered 2012-12-30: 80 mg via INTRAMUSCULAR

## 2012-12-30 MED ORDER — MONTELUKAST SODIUM 10 MG PO TABS: 10.0000 mg | ORAL_TABLET | Freq: Every day | ORAL | Status: DC

## 2012-12-30 NOTE — Progress Notes (Signed)
  Subjective:    Patient ID: Eric Palmer, male    DOB: 08/21/1956, 57 y.o.   MRN: [id number]  HPI Patient seen with increased cough and wheeze and nasal congestion over the past several weeks. Long history of allergies. Tried Zyrtec, Claritin, and Allegra without much relief. Frequent symptoms of watery itchy eyes. Previously has improved with prednisone. Has not tried Singulair Has history of asthma and generally takes Advair once daily  Multiple medical problems including type 2 diabetes and ongoing nicotine use. He is overdue for medical followup. Needs complete physical.  Also complains of some right anterior to lateral pains rib cage pain. Sometimes with deep breathing. Present for about 5 weeks. No hemoptysis. No fevers or chills. No productive coughing.  Past Medical History  Diagnosis Date  . THRUSH 03/18/2009  . HYPERLIPIDEMIA 11/01/2008  . ASTHMA 11/08/2008  . Obstructive sleep apnea    Past Surgical History  Procedure Laterality Date  . Carotid stent      reports that he has been smoking Cigarettes.  He has a 17 pack-year smoking history. He does not have any smokeless tobacco history on file. He reports that he does not drink alcohol. His drug history is not on file. family history includes Heart disease in his other; Heart disease (age of onset: 61) in his father; Heart disease (age of onset: 41) in his mother; and Hyperlipidemia in his other. Allergies  Allergen Reactions  . Cephalosporins   . Codeine   . Gabapentin     "made me feel drugged"  . Penicillins Hives      Review of Systems  Constitutional: Positive for fatigue. Negative for fever, chills, appetite change and unexpected weight change.  Respiratory: Positive for cough, shortness of breath and wheezing.   Cardiovascular: Negative for chest pain, palpitations and leg swelling.  Gastrointestinal: Negative for abdominal pain.       Objective:   Physical Exam  Constitutional: He appears  well-developed and well-nourished.  HENT:  Right Ear: External ear normal.  Left Ear: External ear normal.  Mouth/Throat: Oropharynx is clear and moist.  Neck: Neck supple. No thyromegaly present.  Cardiovascular: Normal rate and regular rhythm.   Pulmonary/Chest: Effort normal and breath sounds normal. No respiratory distress. He has no wheezes. He has no rales.  Musculoskeletal: He exhibits no edema.  Lymphadenopathy:    He has no cervical adenopathy.          Assessment & Plan:  #1 seasonal allergies. Poorly controlled. Depo-Medrol 80 mg IM. Start Singulair 10 mg daily. Continue Allegra #2 right chest wall pain. Cough probably related to #1. Chest x-ray given smoking status and several week history of vague poorly localized pains as above #3 health maintenance. Needs complete physical. Schedule followup

## 2013-01-27 ENCOUNTER — Other Ambulatory Visit (INDEPENDENT_AMBULATORY_CARE_PROVIDER_SITE_OTHER): Payer: 59

## 2013-01-27 DIAGNOSIS — Z0000 Encounter for general adult medical examination without abnormal findings: Secondary | ICD-10-CM

## 2013-01-27 DIAGNOSIS — E8881 Metabolic syndrome: Secondary | ICD-10-CM

## 2013-01-27 LAB — POCT URINALYSIS DIPSTICK
Bilirubin, UA: NEGATIVE
Blood, UA: NEGATIVE
Glucose, UA: NEGATIVE
Ketones, UA: NEGATIVE
Leukocytes, UA: NEGATIVE
Nitrite, UA: NEGATIVE
Protein, UA: NEGATIVE
Spec Grav, UA: 1.02
Urobilinogen, UA: 0.2
pH, UA: 6

## 2013-01-27 LAB — LIPID PANEL
Cholesterol: 116 mg/dL (ref 0–200)
HDL: 32.7 mg/dL — ABNORMAL LOW (ref >39.00)
LDL Cholesterol: 46 mg/dL (ref 0–99)
Total CHOL/HDL Ratio: 4
Triglycerides: 187 mg/dL — ABNORMAL HIGH (ref 0.0–149.0)
VLDL: 37.4 mg/dL (ref 0.0–40.0)

## 2013-01-27 LAB — HEMOGLOBIN A1C: Hgb A1c MFr Bld: 7.3 % — ABNORMAL HIGH (ref 4.6–6.5)

## 2013-01-27 LAB — BASIC METABOLIC PANEL
BUN: 11 mg/dL (ref 6–23)
CO2: 23 mEq/L (ref 19–32)
Calcium: 9.2 mg/dL (ref 8.4–10.5)
Chloride: 105 mEq/L (ref 96–112)
Creatinine, Ser: 0.9 mg/dL (ref 0.4–1.5)
GFR: 91.58 mL/min (ref >60.00)
Glucose, Bld: 130 mg/dL — ABNORMAL HIGH (ref 70–99)
Potassium: 4.2 mEq/L (ref 3.5–5.1)
Sodium: 139 mEq/L (ref 135–145)

## 2013-01-27 LAB — CBC WITH DIFFERENTIAL/PLATELET
Basophils Absolute: 0.1 10*3/uL (ref 0.0–0.1)
Basophils Relative: 0.6 % (ref 0.0–3.0)
Eosinophils Absolute: 0.3 10*3/uL (ref 0.0–0.7)
Eosinophils Relative: 3.6 % (ref 0.0–5.0)
HCT: 45.9 % (ref 39.0–52.0)
Hemoglobin: 15.7 g/dL (ref 13.0–17.0)
Lymphocytes Relative: 22.8 % (ref 12.0–46.0)
Lymphs Abs: 2.1 10*3/uL (ref 0.7–4.0)
MCHC: 34.2 g/dL (ref 30.0–36.0)
MCV: 91.4 fl (ref 78.0–100.0)
Monocytes Absolute: 0.9 10*3/uL (ref 0.1–1.0)
Monocytes Relative: 9.7 % (ref 3.0–12.0)
Neutro Abs: 5.8 10*3/uL (ref 1.4–7.7)
Neutrophils Relative %: 63.3 % (ref 43.0–77.0)
Platelets: 243 10*3/uL (ref 150.0–400.0)
RBC: 5.03 Mil/uL (ref 4.22–5.81)
RDW: 13.4 % (ref 11.5–14.6)
WBC: 9.2 10*3/uL (ref 4.5–10.5)

## 2013-01-27 LAB — MICROALBUMIN / CREATININE URINE RATIO
Creatinine,U: 201.8 mg/dL
Microalb Creat Ratio: 2.9 mg/g (ref 0.0–30.0)
Microalb, Ur: 5.8 mg/dL — ABNORMAL HIGH (ref 0.0–1.9)

## 2013-01-27 LAB — HEPATIC FUNCTION PANEL
ALT: 38 U/L (ref 0–53)
AST: 22 U/L (ref 0–37)
Albumin: 4.1 g/dL (ref 3.5–5.2)
Alkaline Phosphatase: 71 U/L (ref 39–117)
Bilirubin, Direct: 0.1 mg/dL (ref 0.0–0.3)
Total Bilirubin: 0.9 mg/dL (ref 0.3–1.2)
Total Protein: 6.6 g/dL (ref 6.0–8.3)

## 2013-01-27 LAB — TSH: TSH: 1.3 u[IU]/mL (ref 0.35–5.50)

## 2013-01-27 LAB — PSA: PSA: 0.38 ng/mL (ref 0.10–4.00)

## 2013-01-27 LAB — GLUCOSE, POCT (MANUAL RESULT ENTRY): POC Glucose: 152 mg/dl — AB (ref 70–99)

## 2013-01-29 ENCOUNTER — Encounter: Payer: Self-pay | Admitting: Family Medicine

## 2013-02-03 ENCOUNTER — Encounter: Payer: Self-pay | Admitting: Family Medicine

## 2013-02-03 ENCOUNTER — Ambulatory Visit (INDEPENDENT_AMBULATORY_CARE_PROVIDER_SITE_OTHER): Payer: 59 | Admitting: Family Medicine

## 2013-02-03 VITALS — BP 120/68 | HR 72 | Temp 99.0°F | Resp 12 | Wt 275.0 lb

## 2013-02-03 DIAGNOSIS — E119 Type 2 diabetes mellitus without complications: Secondary | ICD-10-CM

## 2013-02-03 DIAGNOSIS — Z0000 Encounter for general adult medical examination without abnormal findings: Secondary | ICD-10-CM

## 2013-02-03 NOTE — Progress Notes (Signed)
  Subjective:    Patient ID: Eric Palmer, male    DOB: November 06, 1955, 57 y.o.   MRN: [id number]  HPI Patient here for complete physical He has history of obesity, obstructive sleep apnea, type 2 diabetes, nonobstructive CAD, transient atrial fibrillation, hyperlipidemia, metabolic syndrome, ongoing nicotine use. Patient recently saw his cardiologist. He was placed on Holter monitor to screen for transient atrial fibrillation No history of any consistent exercise. No recent chest pains. Colonoscopy age 55. Tetanus last year  Past Medical History  Diagnosis Date  . THRUSH 03/18/2009  . HYPERLIPIDEMIA 11/01/2008  . ASTHMA 11/08/2008  . Obstructive sleep apnea    Past Surgical History  Procedure Laterality Date  . Carotid stent      reports that he has been smoking Cigarettes.  He has a 17 pack-year smoking history. He does not have any smokeless tobacco history on file. He reports that he does not drink alcohol. His drug history is not on file. family history includes Heart disease in his other; Heart disease (age of onset: 32) in his father; Heart disease (age of onset: 53) in his mother; and Hyperlipidemia in his other. Allergies  Allergen Reactions  . Cephalosporins   . Codeine   . Gabapentin     "made me feel drugged"  . Penicillins Hives      Review of Systems  Constitutional: Negative for fever, activity change, appetite change and fatigue.  HENT: Negative for ear pain, congestion and trouble swallowing.   Eyes: Negative for pain and visual disturbance.  Respiratory: Negative for cough, shortness of breath and wheezing.   Cardiovascular: Negative for chest pain and palpitations.  Gastrointestinal: Negative for nausea, vomiting, abdominal pain, diarrhea, constipation, blood in stool, abdominal distention and rectal pain.  Genitourinary: Negative for dysuria, hematuria and testicular pain.  Musculoskeletal: Negative for joint swelling and arthralgias.  Skin: Negative for  rash.  Neurological: Negative for dizziness, syncope and headaches.  Hematological: Negative for adenopathy.  Psychiatric/Behavioral: Negative for confusion and dysphoric mood.       Objective:   Physical Exam  Constitutional: He is oriented to person, place, and time. He appears well-developed and well-nourished. No distress.  HENT:  Head: Normocephalic and atraumatic.  Right Ear: External ear normal.  Left Ear: External ear normal.  Mouth/Throat: Oropharynx is clear and moist.  Eyes: Conjunctivae and EOM are normal. Pupils are equal, round, and reactive to light.  Neck: Normal range of motion. Neck supple. No thyromegaly present.  Cardiovascular: Normal rate, regular rhythm and normal heart sounds.   No murmur heard. Pulmonary/Chest: No respiratory distress. He has no wheezes. He has no rales.  Abdominal: Soft. Bowel sounds are normal. He exhibits no distension and no mass. There is no tenderness. There is no rebound and no guarding.  Musculoskeletal: He exhibits no edema.  Lymphadenopathy:    He has no cervical adenopathy.  Neurological: He is alert and oriented to person, place, and time. He displays normal reflexes. No cranial nerve deficit.  Skin: No rash noted.  Multiple scattered skin tags axillary region  Psychiatric: He has a normal mood and affect.          Assessment & Plan:  #1 complete physical. Immunizations up to date. Continue yearly flu vaccine. Encouraged to lose weight. Schedule repeat A1c in 3-4 months. Titrate metformin further at that point if A1c not further controlled with weight loss

## 2013-02-03 NOTE — Patient Instructions (Signed)
Try to lose some weight and lets plan to repeat A1C in 3-4 months.

## 2013-02-10 ENCOUNTER — Other Ambulatory Visit: Payer: Self-pay | Admitting: Family Medicine

## 2013-03-11 ENCOUNTER — Emergency Department (HOSPITAL_COMMUNITY)
Admission: EM | Admit: 2013-03-11 | Discharge: 2013-03-12 | Disposition: A | Discharge status: Home / Self Care | Payer: 59 | Attending: Emergency Medicine | Admitting: Emergency Medicine

## 2013-03-11 ENCOUNTER — Emergency Department (HOSPITAL_COMMUNITY): Payer: 59

## 2013-03-11 ENCOUNTER — Encounter (HOSPITAL_COMMUNITY): Payer: Self-pay | Admitting: *Deleted

## 2013-03-11 DIAGNOSIS — E119 Type 2 diabetes mellitus without complications: Secondary | ICD-10-CM

## 2013-03-11 DIAGNOSIS — J45909 Unspecified asthma, uncomplicated: Secondary | ICD-10-CM | POA: Insufficient documentation

## 2013-03-11 DIAGNOSIS — F17200 Nicotine dependence, unspecified, uncomplicated: Secondary | ICD-10-CM | POA: Insufficient documentation

## 2013-03-11 DIAGNOSIS — R002 Palpitations: Secondary | ICD-10-CM | POA: Insufficient documentation

## 2013-03-11 DIAGNOSIS — Z7982 Long term (current) use of aspirin: Secondary | ICD-10-CM | POA: Insufficient documentation

## 2013-03-11 DIAGNOSIS — E785 Hyperlipidemia, unspecified: Secondary | ICD-10-CM | POA: Insufficient documentation

## 2013-03-11 DIAGNOSIS — Z79899 Other long term (current) drug therapy: Secondary | ICD-10-CM | POA: Insufficient documentation

## 2013-03-11 DIAGNOSIS — Z8619 Personal history of other infectious and parasitic diseases: Secondary | ICD-10-CM | POA: Insufficient documentation

## 2013-03-11 DIAGNOSIS — Z8669 Personal history of other diseases of the nervous system and sense organs: Secondary | ICD-10-CM | POA: Insufficient documentation

## 2013-03-11 LAB — BASIC METABOLIC PANEL
BUN: 12 mg/dL (ref 6–23)
CO2: 25 mEq/L (ref 19–32)
Calcium: 9.1 mg/dL (ref 8.4–10.5)
Chloride: 99 mEq/L (ref 96–112)
Creatinine, Ser: 0.9 mg/dL (ref 0.50–1.35)
GFR calc Af Amer: >90 mL/min (ref >90)
GFR calc non Af Amer: >90 mL/min (ref >90)
Glucose, Bld: 223 mg/dL — ABNORMAL HIGH (ref 70–99)
Potassium: 3.8 mEq/L (ref 3.5–5.1)
Sodium: 134 mEq/L — ABNORMAL LOW (ref 135–145)

## 2013-03-11 LAB — CBC
HCT: 42.1 % (ref 39.0–52.0)
Hemoglobin: 14.7 g/dL (ref 13.0–17.0)
MCH: 30.9 pg (ref 26.0–34.0)
MCHC: 34.9 g/dL (ref 30.0–36.0)
MCV: 88.4 fL (ref 78.0–100.0)
Platelets: 184 10*3/uL (ref 150–400)
RBC: 4.76 MIL/uL (ref 4.22–5.81)
RDW: 12.8 % (ref 11.5–15.5)
WBC: 8.7 10*3/uL (ref 4.0–10.5)

## 2013-03-11 LAB — POCT I-STAT TROPONIN I: Troponin i, poc: 0.01 ng/mL (ref 0.00–0.08)

## 2013-03-11 LAB — PRO B NATRIURETIC PEPTIDE: Pro B Natriuretic peptide (BNP): 16.6 pg/mL (ref 0–125)

## 2013-03-11 NOTE — ED Notes (Signed)
hx of Afib. Verdis Prime is cardiologist. Pt states he feels like his heart racing, than slowing down and than racing again, Chest tightness, SOB

## 2013-03-11 NOTE — ED Provider Notes (Signed)
History    CSN: [id number] Arrival date & time 03/11/13  2200  First MD Initiated Contact with Patient 03/11/13 2345     Chief Complaint  Patient presents with  . Chest Pain   (Consider location/radiation/quality/duration/timing/severity/associated sxs/prior Treatment) HPI Patient is a 57 yo type 2 diabetic with HTN, asthma, hyperlipidemia, CAD - s/p PTCA at age 40, PVD - s/p CEA and history of atrial fibrillation who smokes 1/2 ppd.   He presents with complaints of sensation of irregular heart beat associated with some mild chest tightness. Sx began at approximately 1500. Sx have waxed and waned since then. He is currently symptom free. Patient has not experienced any chest tightness in the absence of palpitations. Patient has not experienced a sensation of tachycardia, only irregularity.   Patient notes that his cardiologist, Dr. Katrinka Blazing, prescribed a 2 week Holter monitor to monitor for atrial fibrillation. Patient completed this study about 2 weeks ago and was told that no atrial fibrillation was detected. The patient is not anticoagulated. He is taking Diltiazem 120mg .   Patient believes that his last cardiac stress test was approximately 2 years ago.  Past Medical History  Diagnosis Date  . THRUSH 03/18/2009  . HYPERLIPIDEMIA 11/01/2008  . ASTHMA 11/08/2008  . Obstructive sleep apnea    Past Surgical History  Procedure Laterality Date  . Carotid stent     Family History  Problem Relation Age of Onset  . Hyperlipidemia Other   . Heart disease Other   . Heart disease Mother 58    Arrhythmia  . Heart disease Father 82    died 54 CHF   History  Substance Use Topics  . Smoking status: Current Every Day Smoker -- 0.50 packs/day for 34 years    Types: Cigarettes  . Smokeless tobacco: Not on file  . Alcohol Use: No    Review of Systems Gen: no weight loss, fevers, chills, night sweats Eyes: no discharge or drainage, no occular pain or visual changes Nose: no epistaxis or  rhinorrhea Mouth: no dental pain, no sore throat Neck: no neck pain Lungs: no SOB, cough, wheezing CV: As per history of present illness, otherwise negative Abd: no abdominal pain, nausea, vomiting GU: no dysuria or gross hematuria MSK: no myalgias or arthralgias Neuro: no headache, no focal neurologic deficits Skin: no rash Psyche: negative.  Allergies  Cephalosporins; Codeine; Gabapentin; and Penicillins  Home Medications   Current Outpatient Rx  Name  Route  Sig  Dispense  Refill  . albuterol (PROVENTIL HFA;VENTOLIN HFA) 108 (90 BASE) MCG/ACT inhaler   Inhalation   Inhale 2 puffs into the lungs every 6 (six) hours as needed. For breathing         . aspirin 325 MG tablet   Oral   Take 325 mg by mouth daily.           [name] atorvastatin (LIPITOR) 40 MG tablet   Oral   Take 40 mg by mouth daily.           . cetirizine (ZYRTEC) 10 MG tablet   Oral   Take 10 mg by mouth daily.         [name] diltiazem (CARDIZEM CD) 120 MG 24 hr capsule   Oral   Take 1 capsule (120 mg total) by mouth daily.   30 capsule   0   . fish oil-omega-3 fatty acids 1000 MG capsule   Oral   Take 1 g by mouth 2 (two) times daily.          [name]  Fluticasone-Salmeterol (ADVAIR) 100-50 MCG/DOSE AEPB   Inhalation   Inhale 1 puff into the lungs at bedtime.         . metFORMIN (GLUCOPHAGE) 500 MG tablet   Oral   Take 500 mg by mouth 2 (two) times daily with a meal.         . montelukast (SINGULAIR) 10 MG tablet   Oral   Take 1 tablet (10 mg total) by mouth at bedtime.   30 tablet   3    BP 150/78  Pulse 85  Temp(Src) 97.8 F (36.6 C) (Oral)  Resp 18  SpO2 96% Physical Exam Gen: well developed and well nourished appearing Head: NCAT Eyes: PERL, EOMI Nose: no epistaixis or rhinorrhea Mouth/throat: mucosa is moist and pink Neck: supple, no stridor Lungs: CTA B, no wheezing, rhonchi or rales CV - RRR, no murmur, no ectopy Abd: soft, notender, nondistended Back: no ttp, no cva  ttp Skin: no rashese, wnl Neuro: CN ii-xii grossly intact, no focal deficits Psyche; normal affect,  calm and cooperative.   ED Course  Procedures (including critical care time)  Results for orders placed during the hospital encounter of 03/11/13 (from the past 24 hour(s))  CBC     Status: None   Collection Time    03/11/13 10:17 PM      Result Value Range   WBC 8.7  4.0 - 10.5 K/uL   RBC 4.76  4.22 - 5.81 MIL/uL   Hemoglobin 14.7  13.0 - 17.0 g/dL   HCT 16.1  09.6 - 04.5 %   MCV 88.4  78.0 - 100.0 fL   MCH 30.9  26.0 - 34.0 pg   MCHC 34.9  30.0 - 36.0 g/dL   RDW 40.9  81.1 - 91.4 %   Platelets 184  150 - 400 K/uL  BASIC METABOLIC PANEL     Status: Abnormal   Collection Time    03/11/13 10:17 PM      Result Value Range   Sodium 134 (*) 135 - 145 mEq/L   Potassium 3.8  3.5 - 5.1 mEq/L   Chloride 99  96 - 112 mEq/L   CO2 25  19 - 32 mEq/L   Glucose, Bld 223 (*) 70 - 99 mg/dL   BUN 12  6 - 23 mg/dL   Creatinine, Ser 7.82  0.50 - 1.35 mg/dL   Calcium 9.1  8.4 - 95.6 mg/dL   GFR calc non Af Amer >90  >90 mL/min   GFR calc Af Amer >90  >90 mL/min  PRO B NATRIURETIC PEPTIDE     Status: None   Collection Time    03/11/13 10:17 PM      Result Value Range   Pro B Natriuretic peptide (BNP) 16.6  0 - 125 pg/mL  POCT I-STAT TROPONIN I     Status: None   Collection Time    03/11/13 10:24 PM      Result Value Range   Troponin i, poc 0.01  0.00 - 0.08 ng/mL   Comment 3              Dg Chest 2 View  03/11/2013   *RADIOLOGY REPORT*  Clinical Data: Left  CHEST - 2 VIEW  Comparison: 12/30/2012  Findings: The the heart, mediastinum and hila are unremarkable. The lungs are clear.  No pleural effusion or pneumothorax.  The bony thorax is intact.  There has been no change.  IMPRESSION: No acute cardiopulmonary disease.   Original Report Authenticated  By: Amie Portland, M.D.   No diagnosis found.  EKG: nsr, no acute ischemic changes, normal intervals, normal axis, normal qrs complex MDM   Patient remains asymptomatic and second troponin is negative.This was performed 10 after the onset of symptoms. Patient is asking to go home and is stable for d/c. He will follow up with Dr. Katrinka Blazing tomorrow.   Brandt Loosen, MD 03/12/13 [phone]

## 2013-03-11 NOTE — ED Notes (Signed)
Pt reports at 1500 today started having "this fluttering chest feeling. It just felt weird. Almost as if I was skipping a beat." Pt denies chest discomfort or pain. Hx A Fib. Checked heart rate at home and did not notice it to be elevated. Pt resting comfortably in bed. Sinus rhythm on monitor. MD Lavella Lemons at bedside.

## 2013-03-12 LAB — POCT I-STAT TROPONIN I: Troponin i, poc: 0.01 ng/mL (ref 0.00–0.08)

## 2013-03-12 LAB — MAGNESIUM: Magnesium: 1.8 mg/dL (ref 1.5–2.5)

## 2013-03-12 NOTE — ED Notes (Signed)
MD Manly at bedside. 

## 2013-04-10 ENCOUNTER — Other Ambulatory Visit: Payer: Self-pay | Admitting: Family Medicine

## 2013-04-20 ENCOUNTER — Other Ambulatory Visit: Payer: Self-pay | Admitting: Family Medicine

## 2013-05-18 ENCOUNTER — Other Ambulatory Visit: Payer: Self-pay | Admitting: Family Medicine

## 2013-05-20 ENCOUNTER — Other Ambulatory Visit: Payer: Self-pay | Admitting: Interventional Cardiology

## 2013-05-20 DIAGNOSIS — Z79899 Other long term (current) drug therapy: Secondary | ICD-10-CM

## 2013-05-20 DIAGNOSIS — E7800 Pure hypercholesterolemia, unspecified: Secondary | ICD-10-CM

## 2013-05-23 ENCOUNTER — Other Ambulatory Visit (INDEPENDENT_AMBULATORY_CARE_PROVIDER_SITE_OTHER): Payer: 59

## 2013-05-23 DIAGNOSIS — E119 Type 2 diabetes mellitus without complications: Secondary | ICD-10-CM

## 2013-05-23 LAB — HEMOGLOBIN A1C: Hgb A1c MFr Bld: 7.2 % — ABNORMAL HIGH (ref 4.6–6.5)

## 2013-06-16 ENCOUNTER — Other Ambulatory Visit: Payer: Self-pay | Admitting: Interventional Cardiology

## 2013-06-16 ENCOUNTER — Other Ambulatory Visit: Payer: 59

## 2013-06-16 ENCOUNTER — Other Ambulatory Visit: Payer: Self-pay | Admitting: Family Medicine

## 2013-06-19 ENCOUNTER — Other Ambulatory Visit: Payer: 59

## 2013-06-29 ENCOUNTER — Other Ambulatory Visit: Payer: Self-pay

## 2013-07-06 ENCOUNTER — Other Ambulatory Visit: Payer: Self-pay

## 2013-07-06 MED ORDER — DILTIAZEM HCL ER COATED BEADS 120 MG PO CP24: 120.0000 mg | ORAL_CAPSULE | Freq: Every day | ORAL | Status: DC

## 2013-07-10 ENCOUNTER — Ambulatory Visit (INDEPENDENT_AMBULATORY_CARE_PROVIDER_SITE_OTHER): Payer: 59 | Admitting: Family Medicine

## 2013-07-10 ENCOUNTER — Encounter: Payer: Self-pay | Admitting: Family Medicine

## 2013-07-10 VITALS — BP 122/74 | HR 105 | Temp 98.2°F | Ht 73.0 in | Wt 281.0 lb

## 2013-07-10 DIAGNOSIS — E669 Obesity, unspecified: Secondary | ICD-10-CM | POA: Insufficient documentation

## 2013-07-10 DIAGNOSIS — J019 Acute sinusitis, unspecified: Secondary | ICD-10-CM

## 2013-07-10 DIAGNOSIS — J441 Chronic obstructive pulmonary disease with (acute) exacerbation: Secondary | ICD-10-CM

## 2013-07-10 MED ORDER — METHYLPREDNISOLONE ACETATE 40 MG/ML IJ SUSP: 80.0000 mg | Freq: Once | INTRAMUSCULAR | Status: DC

## 2013-07-10 MED ORDER — AZITHROMYCIN 250 MG PO TABS: ORAL_TABLET | ORAL | Status: AC

## 2013-07-10 NOTE — Progress Notes (Signed)
Pre visit review using our clinic review tool, if applicable. No additional management support is needed unless otherwise documented below in the visit note. 

## 2013-07-10 NOTE — Progress Notes (Signed)
  Subjective:    Patient ID: Eric Palmer, male    DOB: Jan 18, 1956, 57 y.o.   MRN: [id number]  HPI Acute visit Patient is seen with a cough productive of yellow sputum over the past week or so. Possible low-grade fever over the weekend. He had some chills and night sweats last light. Couple weeks ago he got up some leaves and noticed some increased congestion since then. He takes Zyrtec and Singulair regularly. Has been using Advair once daily and as needed albuterol. Intermittent wheezing off and on. Progressive headaches and facial pain frontal sinus region over the past week. Patient is allergic to penicillin and cephalosporins. He is unfortunately still smoking. No recent chest pains.  Type 2 diabetes which has been relatively stable. Last A1c 7.2%. No symptoms of hyperglycemia.  Past Medical History  Diagnosis Date  . THRUSH 03/18/2009  . HYPERLIPIDEMIA 11/01/2008  . ASTHMA 11/08/2008  . Obstructive sleep apnea    Past Surgical History  Procedure Laterality Date  . Carotid stent      reports that he has been smoking Cigarettes.  He has a 17 pack-year smoking history. He does not have any smokeless tobacco history on file. He reports that he does not drink alcohol. His drug history is not on file. family history includes Heart disease in his other; Heart disease (age of onset: 33) in his father; Heart disease (age of onset: 68) in his mother; Hyperlipidemia in his other. Allergies  Allergen Reactions  . Cephalosporins   . Codeine   . Gabapentin     "made me feel drugged"  . Penicillins Hives      Review of Systems  Constitutional: Positive for fever and chills.  HENT: Positive for sinus pressure.   Respiratory: Positive for cough.   Neurological: Positive for headaches. Negative for dizziness.       Objective:   Physical Exam  Constitutional: He appears well-developed and well-nourished.  HENT:  Right Ear: External ear normal.  Left Ear: External ear normal.   Mouth/Throat: Oropharynx is clear and moist.  Neck: Neck supple.  Cardiovascular: Normal rate and regular rhythm.   Pulmonary/Chest: Effort normal and breath sounds normal. No respiratory distress. He has no wheezes. He has no rales.  Musculoskeletal: He exhibits no edema.  Lymphadenopathy:    He has no cervical adenopathy.          Assessment & Plan:  Probable acute sinusitis. Possible allergy trigger. He has history of intermittent wheezing with known COPD. Start Zithromax. Depo-Medrol 80 mg IM given. He's already had flu vaccine. We've recommended followup regarding his type 2 diabetes in 3-4 months

## 2013-07-10 NOTE — Patient Instructions (Signed)

## 2013-09-17 ENCOUNTER — Other Ambulatory Visit: Payer: Self-pay | Admitting: Family Medicine

## 2013-10-05 ENCOUNTER — Emergency Department (HOSPITAL_COMMUNITY)
Admission: EM | Admit: 2013-10-05 | Discharge: 2013-10-06 | Disposition: A | Discharge status: Home / Self Care | Payer: 59 | Attending: Emergency Medicine | Admitting: Emergency Medicine

## 2013-10-05 ENCOUNTER — Encounter (HOSPITAL_COMMUNITY): Payer: Self-pay | Admitting: Emergency Medicine

## 2013-10-05 DIAGNOSIS — F17200 Nicotine dependence, unspecified, uncomplicated: Secondary | ICD-10-CM | POA: Insufficient documentation

## 2013-10-05 DIAGNOSIS — J45909 Unspecified asthma, uncomplicated: Secondary | ICD-10-CM | POA: Insufficient documentation

## 2013-10-05 DIAGNOSIS — E785 Hyperlipidemia, unspecified: Secondary | ICD-10-CM | POA: Insufficient documentation

## 2013-10-05 DIAGNOSIS — E860 Dehydration: Secondary | ICD-10-CM | POA: Insufficient documentation

## 2013-10-05 DIAGNOSIS — Z880 Allergy status to penicillin: Secondary | ICD-10-CM | POA: Insufficient documentation

## 2013-10-05 DIAGNOSIS — Z8669 Personal history of other diseases of the nervous system and sense organs: Secondary | ICD-10-CM | POA: Insufficient documentation

## 2013-10-05 DIAGNOSIS — I2510 Atherosclerotic heart disease of native coronary artery without angina pectoris: Secondary | ICD-10-CM | POA: Insufficient documentation

## 2013-10-05 DIAGNOSIS — Z9861 Coronary angioplasty status: Secondary | ICD-10-CM | POA: Insufficient documentation

## 2013-10-05 DIAGNOSIS — R51 Headache: Secondary | ICD-10-CM | POA: Insufficient documentation

## 2013-10-05 DIAGNOSIS — IMO0002 Reserved for concepts with insufficient information to code with codable children: Secondary | ICD-10-CM | POA: Insufficient documentation

## 2013-10-05 DIAGNOSIS — I4891 Unspecified atrial fibrillation: Secondary | ICD-10-CM | POA: Insufficient documentation

## 2013-10-05 DIAGNOSIS — I480 Paroxysmal atrial fibrillation: Secondary | ICD-10-CM

## 2013-10-05 DIAGNOSIS — Z8619 Personal history of other infectious and parasitic diseases: Secondary | ICD-10-CM | POA: Insufficient documentation

## 2013-10-05 DIAGNOSIS — Z7982 Long term (current) use of aspirin: Secondary | ICD-10-CM | POA: Insufficient documentation

## 2013-10-05 DIAGNOSIS — E119 Type 2 diabetes mellitus without complications: Secondary | ICD-10-CM | POA: Insufficient documentation

## 2013-10-05 DIAGNOSIS — Z79899 Other long term (current) drug therapy: Secondary | ICD-10-CM | POA: Insufficient documentation

## 2013-10-05 HISTORY — DX: Atherosclerotic heart disease of native coronary artery without angina pectoris: I25.10

## 2013-10-05 LAB — CBC
HCT: 46.5 % (ref 39.0–52.0)
Hemoglobin: 17.2 g/dL — ABNORMAL HIGH (ref 13.0–17.0)
MCH: 32.6 pg (ref 26.0–34.0)
MCHC: 37 g/dL — ABNORMAL HIGH (ref 30.0–36.0)
MCV: 88.2 fL (ref 78.0–100.0)
Platelets: 237 10*3/uL (ref 150–400)
RBC: 5.27 MIL/uL (ref 4.22–5.81)
RDW: 13.1 % (ref 11.5–15.5)
WBC: 11.1 10*3/uL — ABNORMAL HIGH (ref 4.0–10.5)

## 2013-10-05 LAB — POCT I-STAT TROPONIN I: Troponin i, poc: 0 ng/mL (ref 0.00–0.08)

## 2013-10-05 NOTE — ED Notes (Signed)
Pt. reports mid chest pressure/ heaviness onset this evening with slight SOB and nausea . Pt. stated history of CAD with coronary stent - his cardiologist is Dr. Linard Millers .

## 2013-10-06 ENCOUNTER — Emergency Department (HOSPITAL_COMMUNITY): Payer: 59

## 2013-10-06 LAB — BASIC METABOLIC PANEL
BUN: 15 mg/dL (ref 6–23)
CO2: 20 mEq/L (ref 19–32)
Calcium: 9.7 mg/dL (ref 8.4–10.5)
Chloride: 97 mEq/L (ref 96–112)
Creatinine, Ser: 0.89 mg/dL (ref 0.50–1.35)
GFR calc Af Amer: >90 mL/min (ref >90)
GFR calc non Af Amer: >90 mL/min (ref >90)
Glucose, Bld: 236 mg/dL — ABNORMAL HIGH (ref 70–99)
Potassium: 4.3 mEq/L (ref 3.7–5.3)
Sodium: 138 mEq/L (ref 137–147)

## 2013-10-06 LAB — PRO B NATRIURETIC PEPTIDE: Pro B Natriuretic peptide (BNP): 6.6 pg/mL (ref 0–125)

## 2013-10-06 MED ORDER — SODIUM CHLORIDE 0.9 % IV BOLUS (SEPSIS)
1000.0000 mL | Freq: Once | INTRAVENOUS | Status: AC
  Administered 2013-10-06: 1000 mL via INTRAVENOUS

## 2013-10-06 MED ORDER — DILTIAZEM HCL 100 MG IV SOLR
5.0000 mg/h | INTRAVENOUS | Status: DC
  Administered 2013-10-06: 10 mg/h via INTRAVENOUS

## 2013-10-06 MED ORDER — ACETAMINOPHEN 325 MG PO TABS: 650.0000 mg | ORAL_TABLET | Freq: Once | ORAL | Status: DC

## 2013-10-06 MED ORDER — DILTIAZEM LOAD VIA INFUSION
10.0000 mg | Freq: Once | INTRAVENOUS | Status: AC
  Administered 2013-10-06: 10 mg via INTRAVENOUS
  Filled 2013-10-06: qty 10

## 2013-10-06 MED ORDER — DILTIAZEM HCL 25 MG/5ML IV SOLN: 10.0000 mg | Freq: Once | INTRAVENOUS | Status: DC

## 2013-10-06 NOTE — Discharge Instructions (Signed)
Atrial Fibrillation  Atrial fibrillation is a type of irregular heart rhythm (arrhythmia). During atrial fibrillation, the upper chambers of the heart (atria) quiver continuously in a chaotic pattern. This causes an irregular and often rapid heart rate.   Atrial fibrillation is the result of the heart becoming overloaded with disorganized signals that tell it to beat. These signals are normally released one at a time by a part of the right atrium called the sinoatrial node. They then travel from the atria to the lower chambers of the heart (ventricles), causing the atria and ventricles to contract and pump blood as they pass. In atrial fibrillation, parts of the atria outside of the sinoatrial node also release these signals. This results in two problems. First, the atria receive so many signals that they do not have time to fully contract. Second, the ventricles, which can only receive one signal at a time, beat irregularly and out of rhythm with the atria.   There are three types of atrial fibrillation:    Paroxysmal Paroxysmal atrial fibrillation starts suddenly and stops on its own within a week.    Persistent Persistent atrial fibrillation lasts for more than a week. It may stop on its own or with treatment.    Permanent Permanent atrial fibrillation does not go away. Episodes of atrial fibrillation may lead to permanent atrial fibrillation.   Atrial fibrillation can prevent your heart from pumping blood normally. It increases your risk of stroke and can lead to heart failure.   CAUSES    Heart conditions, including a heart attack, heart failure, coronary artery disease, and heart valve conditions.    Inflammation of the sac that surrounds the heart (pericarditis).    Blockage of an artery in the lungs (pulmonary embolism).    Pneumonia or other infections.    Chronic lung disease.    Thyroid problems, especially if the thyroid is overactive (hyperthyroidism).    Caffeine, excessive alcohol  use, and use of some illegal drugs.    Use of some medications, including certain decongestants and diet pills.    Heart surgery.    Birth defects.   Sometimes, no cause can be found. When this happens, the atrial fibrillation is called lone atrial fibrillation. The risk of complications from atrial fibrillation increases if you have lone atrial fibrillation and you are age 60 years or older.  RISK FACTORS   Heart failure.   Coronary artery disease   Diabetes mellitus.    High blood pressure (hypertension).    Obesity.    Other arrhythmias.    Increased age.  SYMPTOMS    A feeling that your heart is beating rapidly or irregularly.    A feeling of discomfort or pain in your chest.    Shortness of breath.    Sudden lightheadedness or weakness.    Getting tired easily when exercising.    Urinating more often than normal (mainly when atrial fibrillation first begins).   In paroxysmal atrial fibrillation, symptoms may start and suddenly stop.  DIAGNOSIS   Your caregiver may be able to detect atrial fibrillation when taking your pulse. Usually, testing is needed to diagnosis atrial fibrillation. Tests may include:    Electrocardiography. During this test, the electrical impulses of your heart are recorded while you are lying down.    Echocardiography. During echocardiography, sound waves are used to evaluate how blood flows through your heart.    Stress test. There is more than one type of stress test. If a stress test is   needed, ask your caregiver about which type is best for you.    Chest X-ray exam.    Blood tests.    Computed tomography (CT).   TREATMENT    Treating any underlying conditions. For example, if you have an overactive thyroid, treating the condition may correct atrial fibrillation.    Medication. Medications may be given to control a rapid heart rate or to prevent blood clots, heart failure, or a stroke.    Procedure to correct the rhythm of the  heart:   Electrical cardioversion. During electrical cardioversion, a controlled, low-energy shock is delivered to the heart through your skin. If you have chest pain, very low pressure blood pressure, or sudden heart failure, this procedure may need to be done as an emergency.   Catheter ablation. During this procedure, heart tissues that send the signals that cause atrial fibrillation are destroyed.   Maze or minimaze procedure. During this surgery, thin lines of heart tissue that carry the abnormal signals are destroyed. The maze procedure is an open-heart surgery. The minimaze procedure is a minimally invasive surgery. This means that small cuts are made to access the heart instead of a large opening.   Pulmonary venous isolation. During this surgery, tissue around the veins that carry blood from the lungs (pulmonary veins) is destroyed. This tissue is thought to carry the abnormal signals.  HOME CARE INSTRUCTIONS    Take medications as directed by your caregiver.   Only take medications that your caregiver approves. Some medications can make atrial fibrillation worse or recur.   If blood thinners were prescribed by your caregiver, take them exactly as directed. Too much can cause bleeding. Too little and you will not have the needed protection against stroke and other problems.   Perform blood tests at home if directed by your caregiver.   Perform blood tests exactly as directed.    Quit smoking if you smoke.    Do not drink alcohol.    Do not drink caffeinated beverages such as coffee, soda, and some teas. You may drink decaffeinated coffee, soda, or tea.    Maintain a healthy weight. Do not use diet pills unless your caregiver approves. They may make heart problems worse.    Follow diet instructions as directed by your caregiver.    Exercise regularly as directed by your caregiver.    Keep all follow-up appointments.  PREVENTION   The following substances can cause atrial fibrillation  to recur:    Caffeinated beverages.    Alcohol.    Certain medications, especially those used for breathing problems.    Certain herbs and herbal medications, such as those containing ephedra or ginseng.   Illegal drugs such as cocaine and amphetamines.  Sometimes medications are given to prevent atrial fibrillation from recurring. Proper treatment of any underlying condition is also important in helping prevent recurrence.   SEEK MEDICAL CARE IF:   You notice a change in the rate, rhythm, or strength of your heartbeat.    You suddenly begin urinating more frequently.    You tire more easily when exerting yourself or exercising.   SEEK IMMEDIATE MEDICAL CARE IF:    You develop chest pain, abdominal pain, sweating, or weakness.   You feel sick to your stomach (nauseous).   You develop shortness of breath.   You suddenly develop swollen feet and ankles.   You feel dizzy.   You face or limbs feel numb or weak.   There is a change in your   vision or speech.  MAKE SURE YOU:    Understand these instructions.   Will watch your condition.   Will get help right away if you are not doing well or get worse.  Document Released: 08/10/2005 Document Revised: 12/05/2012 Document Reviewed: 09/20/2012  [employer] Patient Information 2014 [employer]

## 2013-10-06 NOTE — ED Provider Notes (Signed)
CSN: [id number]     Arrival date & time 10/05/13  2317 History   First MD Initiated Contact with Patient 10/05/13 2355     Chief Complaint  Patient presents with  . Chest Pain     (Consider location/radiation/quality/duration/timing/severity/associated sxs/prior Treatment) HPI 58 year old gentleman presents to emergency department from home with complaint of chest pressure and palpitations.  He has history of paroxysmal A. fib.  He reports around 10:30.  He had onset of palpitations.  With that he has had pressure sensation on top of his chest.  Patient has past history of diabetes and coronary disease, status post stent as well.  He is followed by Dr. Tamala Julian.  He is on no anticoagulants aside from a small strength.  Aspirin.  Patient, reports he's had 2-3 prior episodes of paroxysmal A. fib.  He usually converts after medications he reports.  Patient reports mild shortness of breath with onset of palpitations, none currently.  He is complaining of generalized headache as well. Past Medical History  Diagnosis Date  . THRUSH 03/18/2009  . HYPERLIPIDEMIA 11/01/2008  . ASTHMA 11/08/2008  . Obstructive sleep apnea   . Coronary artery disease   . Atrial fibrillation    Past Surgical History  Procedure Laterality Date  . Carotid stent     Family History  Problem Relation Age of Onset  . Hyperlipidemia Other   . Heart disease Other   . Heart disease Mother 46    Arrhythmia  . Heart disease Father 68    died 55 CHF   History  Substance Use Topics  . Smoking status: Current Every Day Smoker -- 0.50 packs/day for 34 years    Types: Cigarettes  . Smokeless tobacco: Not on file  . Alcohol Use: No    Review of Systems  See History of Present Illness; otherwise all other systems are reviewed and negative   Allergies  Cephalosporins; Codeine; Gabapentin; and Penicillins  Home Medications   Current Outpatient Rx  Name  Route  Sig  Dispense  Refill  . ADVAIR DISKUS 100-50 MCG/DOSE  AEPB      INHALE ONE PUFF BY MOUTH TWICE DAILY   60 each   3   . albuterol (PROVENTIL HFA;VENTOLIN HFA) 108 (90 BASE) MCG/ACT inhaler   Inhalation   Inhale 2 puffs into the lungs every 6 (six) hours as needed. For breathing         . aspirin 325 MG tablet   Oral   Take 325 mg by mouth daily.           [name] atorvastatin (LIPITOR) 40 MG tablet      TAKE ONE TABLET BY MOUTH ONCE DAILY   30 tablet   8   . cetirizine (ZYRTEC) 10 MG tablet   Oral   Take 10 mg by mouth daily.         [name] diltiazem (CARDIZEM CD) 120 MG 24 hr capsule   Oral   Take 1 capsule (120 mg total) by mouth daily.   30 capsule   6   . fish oil-omega-3 fatty acids 1000 MG capsule   Oral   Take 1 g by mouth 2 (two) times daily.          . Fluticasone-Salmeterol (ADVAIR) 100-50 MCG/DOSE AEPB   Inhalation   Inhale 1 puff into the lungs at bedtime.         . metFORMIN (GLUCOPHAGE) 500 MG tablet   Oral   Take 500 mg by mouth 2 (  two) times daily with a meal.         . metFORMIN (GLUCOPHAGE) 500 MG tablet      TAKE 1 TABLET BY MOUTH TWICE DAILY WITH MEALS   60 tablet   5   . montelukast (SINGULAIR) 10 MG tablet      TAKE 1 TABLET BY MOUTH EVERY NIGHT AT BEDTIME   30 tablet   5   . PROAIR HFA 108 (90 BASE) MCG/ACT inhaler      INHALE 2 PUFFS BY MOUTH EVERY 4 HOURS AS NEEDED FOR WHEEZING   8.5 g   2    BP 117/75  Pulse 111  Temp(Src) 98.8 F (37.1 C) (Oral)  Resp 16  SpO2 100% Physical Exam  Nursing note and vitals reviewed. Constitutional: He is oriented to person, place, and time. He appears well-developed and well-nourished. He appears distressed (uncomfortable appearing).  HENT:  Head: Normocephalic and atraumatic.  Right Ear: External ear normal.  Left Ear: External ear normal.  Nose: Nose normal.  Mouth/Throat: Oropharynx is clear and moist.  Eyes: Conjunctivae and EOM are normal. Pupils are equal, round, and reactive to light.  Neck: Normal range of motion. Neck supple.  No JVD present. No tracheal deviation present. No thyromegaly present.  Cardiovascular: Normal heart sounds and intact distal pulses.  Exam reveals no gallop and no friction rub.   No murmur heard. Rapid irregular irregular  Pulmonary/Chest: Effort normal and breath sounds normal. No stridor. No respiratory distress. He has no wheezes. He has no rales. He exhibits no tenderness.  Abdominal: Soft. Bowel sounds are normal. He exhibits no distension and no mass. There is no tenderness. There is no rebound and no guarding.  Musculoskeletal: Normal range of motion. He exhibits no edema and no tenderness.  Lymphadenopathy:    He has no cervical adenopathy.  Neurological: He is alert and oriented to person, place, and time. He exhibits normal muscle tone. Coordination normal.  Skin: Skin is warm and dry. No rash noted. No erythema. No pallor.  Psychiatric: He has a normal mood and affect. His behavior is normal. Judgment and thought content normal.    ED Course  Procedures (including critical care time)  CRITICAL CARE Performed by: Kalman Drape Total critical care time: 45 min Critical care time was exclusive of separately billable procedures and treating other patients. Critical care was necessary to treat or prevent imminent or life-threatening deterioration. Critical care was time spent personally by me on the following activities: development of treatment plan with patient and/or surrogate as well as nursing, discussions with consultants, evaluation of patient's response to treatment, examination of patient, obtaining history from patient or surrogate, ordering and performing treatments and interventions, ordering and review of laboratory studies, ordering and review of radiographic studies, pulse oximetry and re-evaluation of patient's condition.  Labs Review Labs Reviewed  CBC - Abnormal; Notable for the following:    WBC 11.1 (*)    Hemoglobin 17.2 (*)    MCHC 37.0 (*)    All other  components within normal limits  BASIC METABOLIC PANEL - Abnormal; Notable for the following:    Glucose, Bld 236 (*)    All other components within normal limits  PRO B NATRIURETIC PEPTIDE  POCT I-STAT TROPONIN I   Imaging Review No results found.  EKG Interpretation    Date/Time:  Thursday October 05 2013 23:29:32 EST Ventricular Rate:  165 PR Interval:    QRS Duration: 102 QT Interval:  300 QTC Calculation: 497 R  Axis:   89 Text Interpretation:  Atrial fibrillation with rapid ventricular response Marked ST abnormality, possible inferior subendocardial injury Abnormal ECG Confirmed by Latrel Szymczak  MD, Harlon Kutner [id number]) on 10/05/2013 11:56:03 PM            MDM   Final diagnoses:  Paroxysmal a-fib    58 year old male with A. fib with RVR, mild chest discomfort.  He does have some ST depressions most likely rate related.  Plan to get labs, portable chest x-ray.  Will start IV diltiazem bolus and drip.  Will discuss with cardiology.  1:14 AM Patient has converted to sinus rhythm.  Left show mild dehydration, receiving IV fluids.  Plan to discharge home to followup with his cardiologist.  Chadds score is 2, will need discussion with his cardiologist, about anticoagulation.    Kalman Drape, MD 10/06/13 [phone]

## 2013-10-12 ENCOUNTER — Telehealth: Payer: Self-pay | Admitting: Physician Assistant

## 2013-10-12 NOTE — Telephone Encounter (Addendum)
Patient called answering service after-hours. He has h/o atrial fib in 2013. Not on anticoag at that time due to CHADS score of 1 per notes; was placed on diltiazem 120mg  daily. Has done relatively well since that time. Went to the ER 10/05/13 with recurrent AF HR 160s and required diltiazem drip. Was discharged home with plan for outpatient cardiology f/u but did not get an appointment until tomorrow with [name]. Around 6pm he felt himself go back into rapid AF. It does not feel as fast as it did the other night. At times HR is coming down but remains generally mildly elevated. Does not have BP cuff. He does not want to come to the ER tonight and was wondering if he can take extra diltiazem. BP in the ER was 117/75 so I feel this a good initial strategy. I advised him to take an additional capsule now and to try to relax as much as possible (afib makes him feel restless). If sx do not resolve, told him he may need to come to the ER for evaluation, otherwise if sx improve he can keep followup with [name] in the office as scheduled tomorrow. D/w Dr. Wynonia Lawman. Will need discussion about anticoagulation at that time. He verbalized understanding and gratitude.  Patrick Sohm PA-C

## 2013-10-13 ENCOUNTER — Ambulatory Visit (INDEPENDENT_AMBULATORY_CARE_PROVIDER_SITE_OTHER): Payer: 59 | Admitting: Physician Assistant

## 2013-10-13 ENCOUNTER — Encounter: Payer: Self-pay | Admitting: Physician Assistant

## 2013-10-13 VITALS — BP 130/62 | HR 78 | Ht 73.0 in | Wt 275.0 lb

## 2013-10-13 DIAGNOSIS — E119 Type 2 diabetes mellitus without complications: Secondary | ICD-10-CM

## 2013-10-13 DIAGNOSIS — I2510 Atherosclerotic heart disease of native coronary artery without angina pectoris: Secondary | ICD-10-CM

## 2013-10-13 DIAGNOSIS — E7800 Pure hypercholesterolemia, unspecified: Secondary | ICD-10-CM

## 2013-10-13 DIAGNOSIS — I4891 Unspecified atrial fibrillation: Secondary | ICD-10-CM

## 2013-10-13 MED ORDER — DRONEDARONE HCL 400 MG PO TABS: 400.0000 mg | ORAL_TABLET | Freq: Two times a day (BID) | ORAL | Status: DC

## 2013-10-13 MED ORDER — RIVAROXABAN 20 MG PO TABS: 20.0000 mg | ORAL_TABLET | Freq: Every day | ORAL | Status: DC

## 2013-10-13 NOTE — Patient Instructions (Signed)
STOP ASPIRIN   START XARELTO 20 MG 1 TABLET DAILY START MULTAQ 400 MG 1 TABLET TWICE DAILY (EVERY 12 HOURS); MAKE SURE TO TAKE WITH FOOD  LAB WORK IN 6 WEEKS; BMET, CBC W/DIFF  Your physician has requested that you have an echocardiogram. Echocardiography is a painless test that uses sound waves to create images of your heart. It provides your doctor with information about the size and shape of your heart and how well your heart's chambers and valves are working. This procedure takes approximately one hour. There are no restrictions for this procedure.  Your physician recommends that you schedule a follow-up appointment ON 11/02/13 @ 2:45  WITH DR. ALLRED

## 2013-10-13 NOTE — Progress Notes (Signed)
[address] [place], [name]  [address] Phone: [phone] Fax:  [phone]  Date:  10/13/2013   ID:  [name], DOB [birth year], MRN [id number]  PCP:  Eric Post, MD  Cardiologist:  Dr. Daneen Palmer     History of Present Illness: Eric Palmer is a 58 y.o. male with a history of CAD, status [place] PCI in 1996, paroxysmal atrial fibrillation, diabetes, OSA, tobacco abuse.  Patient was admitted with atrial fibrillation with RVR in 05/2012. He converted to NSR with IV diltiazem. Last seen by Dr. Tamala Palmer 03/2013. Patient was set up for stress testing to exclude ischemia in the setting of chest pain. This was low risk without ischemia. Patient was recently seen in the emergency room 10/06/13 with chest pain and palpitations. He was found to be in atrial fibrillation with RVR with heart rates in the 160s. He converted to NSR on IV diltiazem. He returns for follow up.  He did call the answering service last night and felt as though he had gone back into atrial fibrillation. He was advised take an extra diltiazem.  He did not get any relief but went to bed. He felt like he was back in NSR this AM. He notes some fatigue now.  Denies any exertional dyspnea or chest pain.  He does feel chest tightness and shortness of breath when he is in AFib with RVR.  He denies orthopnea, PND, edema.  Sleeps with CPAP.  Denies syncope or near syncope.    LHC (01/2006): Mild anterior HK, EF 50%, mid LAD kink with 50% narrowing, mid D1 40%, RCA is a shepherd's crook vessel with tortuosity and mild luminal irregularities. Myoview (04/2009): No ischemia or scar, EF 53%. Echocardiogram (05/2012): Mild to moderate LVH, EF 55-60%, trace MR, trace TR, grade 2 diastolic dysfunction Event monitor (01/2013): No atrial fibrillation. Myoview (03/2013): No ischemia, diaphragmatic attenuation, EF 69%.  Recent Labs: 01/27/2013: ALT 38; HDL Cholesterol 32.70*; LDL (calc) 46; TSH 1.30  10/05/2013: Creatinine 0.89;  Hemoglobin 17.2*; Potassium 4.3; Pro B Natriuretic peptide (BNP) 6.6   Wt Readings from Last 3 Encounters:  10/13/13 275 lb (124.739 kg)  07/10/13 281 lb (127.461 kg)  02/03/13 275 lb (124.739 kg)     Past Medical History  Diagnosis Date  . THRUSH 03/18/2009  . HYPERLIPIDEMIA 11/01/2008  . ASTHMA 11/08/2008  . Obstructive sleep apnea   . Coronary artery disease   . Atrial fibrillation     Current Outpatient Prescriptions  Medication Sig Dispense Refill  . aspirin 325 MG tablet Take 325 mg by mouth daily.        [name] atorvastatin (LIPITOR) 40 MG tablet Take 40 mg by mouth daily.      [name] diltiazem (CARDIZEM CD) 120 MG 24 hr capsule Take 1 capsule (120 mg total) by mouth daily.  30 capsule  6  . fish oil-omega-3 fatty acids 1000 MG capsule Take 1 g by mouth 2 (two) times daily.       . Fluticasone-Salmeterol (ADVAIR) 100-50 MCG/DOSE AEPB Inhale 1 puff into the lungs at bedtime.      . metFORMIN (GLUCOPHAGE) 500 MG tablet Take 500 mg by mouth 2 (two) times daily with a meal.      . montelukast (SINGULAIR) 10 MG tablet Take 10 mg by mouth at bedtime.       No current facility-administered medications for this visit.    Allergies:   Cephalosporins; Penicillins; Codeine; and Gabapentin   Social History:  The patient  reports that he has been smoking Cigarettes.  He has a 17 pack-year smoking history. He does not have any smokeless tobacco history on file. He reports that he does not drink alcohol.   Family History:  The patient's family history includes Heart disease in his other; Heart disease (age of onset: 18) in his father; Heart disease (age of onset: 55) in his mother; Hyperlipidemia in his other.   ROS:  Please see the history of present illness.      All other systems reviewed and negative.   PHYSICAL EXAM: VS:  BP 130/62  Pulse 78  Ht 6\' 1"  (1.854 m)  Wt 275 lb (124.739 kg)  BMI 36.29 kg/m2 Well nourished, well developed, in no acute distress HEENT: normal Neck: no  JVD Cardiac:  normal S1, S2; RRR; no murmur Lungs:  clear to auscultation bilaterally, no wheezing, rhonchi or rales Abd: soft, nontender, no hepatomegaly Ext: no edema Skin: warm and dry Neuro:  CNs 2-12 intact, no focal abnormalities noted  EKG:  NSR, HR 72, normal axis, QTc 429, no ST changes     ASSESSMENT AND PLAN:  1. Paroxysmal Atrial Fibrillation:  Maintaining NSR.  He is having more and more episodes of AFib with RVR.  CHADS2-VASc=2 (DM, vascular dz).  He denies a hx of HTN but has LVH on echo so CHADS2-VASc may be 3.  In any event, he needs long term anticoagulation.  D/c ASA.  Start Xarelto 20 mg QD (creatinine clearance is 163).  Continue Diltiazem.  He would likely benefit from seeing EP to see if he is a candidate for PVI ablation (enventually).  I reviewed his case with Dr. Thompson Palmer today.  He suggested trying Multaq 400 BID.  I will start him on Multaq 400 mg BID with food.  I will repeat his Echo to confirm his LVF remains normal.  I will refer him to Dr. Thompson Palmer for further evaluation.    2. CAD:  No angina.  Continue statin.  D/c ASA as he is starting Xarelto.   3. Hyperlipidemia:  Continue statin. 4. Diabetes Mellitus:  F/u with PCP. 5. Disposition:  Refer to Dr. Thompson Palmer.   Signed, [name], PA-C  10/13/2013 2:40 PM

## 2013-10-15 ENCOUNTER — Telehealth: Payer: Self-pay | Admitting: Physician Assistant

## 2013-10-15 NOTE — Telephone Encounter (Signed)
Eric Palmer has a history of PAF. He was seen recently in the office and started on Multaq. He has had 4 doses so far. He feels very bad and feels like he is spending much more time in atrial fibrillation than he was previously. He feels like he is only out of atrial fibrillation for a few minutes and then going right back into it. He gets significant dyspnea on exertion and when he does any walking over 100 feet or so feels like his heart is pounding out of his chest. He is compliant with his other medications including the Xarelto.  Discussed options with the patient. He does not have a way to check his blood pressure or heart rate at home. Advised him that if he was able to check his blood pressure and his blood pressure was normal he could take an extra diltiazem to see if this would help with rate control. Advised him I could call in a prescription for a beta blocker but since he is on medications for asthma, I did not recommend this. I advised him if he was feeling symptomatic from the atrial fibrillation then he might need to come to the emergency room where he could be more effectively treated. The patient does not currently wish to do this.  For now, Eric Palmer is going to try to check his blood pressure and if it is normal or high, he will take an additional diltiazem to see if this helps with rate control. He felt like B. additional atrial fibrillation was a side effect of the Multaq. I discussed this with him and advised him that the Multaq generally decreases atrial fibrillation, doesn't increase it. I encouraged him to continue to be compliant with the Multaq and let the office call him in the morning if he did not come to the emergency room so that they could offer him an appointment for further evaluation. The patient is in agreement with this as a plan of care.

## 2013-10-16 ENCOUNTER — Ambulatory Visit (INDEPENDENT_AMBULATORY_CARE_PROVIDER_SITE_OTHER): Payer: 59 | Admitting: Nurse Practitioner

## 2013-10-16 ENCOUNTER — Encounter: Payer: Self-pay | Admitting: Nurse Practitioner

## 2013-10-16 VITALS — BP 142/70 | HR 79 | Ht 74.0 in | Wt 275.8 lb

## 2013-10-16 DIAGNOSIS — R079 Chest pain, unspecified: Secondary | ICD-10-CM

## 2013-10-16 DIAGNOSIS — I259 Chronic ischemic heart disease, unspecified: Secondary | ICD-10-CM

## 2013-10-16 DIAGNOSIS — I4891 Unspecified atrial fibrillation: Secondary | ICD-10-CM

## 2013-10-16 MED ORDER — DILTIAZEM HCL ER COATED BEADS 240 MG PO CP24: 240.0000 mg | ORAL_CAPSULE | Freq: Every day | ORAL | Status: DC

## 2013-10-16 MED ORDER — DILTIAZEM HCL ER COATED BEADS 120 MG PO CP24: 120.0000 mg | ORAL_CAPSULE | Freq: Every day | ORAL | Status: DC | PRN

## 2013-10-16 NOTE — Progress Notes (Signed)
[name] Date of Birth: Jul 22, 1956 Medical Record #[id number]  History of Present Illness: Eric Palmer is seen back today for a work in visit. Seen for Dr. Tamala Julian. He is a 59 y.o. male with a history of CAD, status post PCI in 1996, paroxysmal atrial fibrillation, diabetes, OSA, tobacco abuse. Patient was admitted with atrial fibrillation with RVR in 05/2012. He converted to NSR with IV diltiazem. Last seen by Dr. Tamala Julian 03/2013. Patient was set up for stress testing to exclude ischemia in the setting of chest pain. This was low risk without ischemia. Patient was recently seen in the emergency room 10/06/13 with chest pain and palpitations. He was found to be in atrial fibrillation with RVR with heart rates in the 160s. He converted to NSR on IV diltiazem.   LHC (01/2006): Mild anterior HK, EF 50%, mid LAD kink with 50% narrowing, mid D1 40%, RCA is a shepherd's crook vessel with tortuosity and mild luminal irregularities.  Myoview (04/2009): No ischemia or scar, EF 53%.  Echocardiogram (05/2012): Mild to moderate LVH, EF 55-60%, trace MR, trace TR, grade 2 diastolic dysfunction  Event monitor (01/2013): No atrial fibrillation.  Myoview (03/2013): No ischemia, diaphragmatic attenuation, EF 69%  He was seen by [name] PA this past Friday.He had called the answering service the night before and felt as though he had gone back into atrial fibrillation. He was advised take an extra diltiazem. He did not get any relief but went to bed. He felt like he was back in NSR this AM. [name] consulted with Dr. [name] - they decided to start Multaq, changed ASA to Xarelto, repeated his echo and referred him on to EP for possible ablation.   Comes back today. He is here alone. Has called the answering service again last night. Still having spells of fast heart beat associated with chest pain and shortness of breath. Very fatigued - has trouble keeping up with work responsibilities. Has only had about 4 to  5 doses of his Multaq. Does not feel like this has made him feel worse. No bleeding or bruising with the Xarelto. Off his aspirin. He has stopped smoking - stopped cold [place] - as of Friday.   Current Outpatient Prescriptions  Medication Sig Dispense Refill  . atorvastatin (LIPITOR) 40 MG tablet Take 40 mg by mouth daily.      [name] diltiazem (CARDIZEM CD) 120 MG 24 hr capsule Take 1 capsule (120 mg total) by mouth daily.  30 capsule  6  . dronedarone (MULTAQ) 400 MG tablet Take 1 tablet (400 mg total) by mouth 2 (two) times daily with a meal.  60 tablet  11  . fish oil-omega-3 fatty acids 1000 MG capsule Take 1 g by mouth 2 (two) times daily.       . Fluticasone-Salmeterol (ADVAIR) 100-50 MCG/DOSE AEPB Inhale 1 puff into the lungs at bedtime.      . metFORMIN (GLUCOPHAGE) 500 MG tablet Take 500 mg by mouth 2 (two) times daily with a meal.      . montelukast (SINGULAIR) 10 MG tablet Take 10 mg by mouth at bedtime.      . Rivaroxaban (XARELTO) 20 MG TABS tablet Take 1 tablet (20 mg total) by mouth daily with supper.  30 tablet  11   No current facility-administered medications for this visit.    Allergies  Allergen Reactions  . Cephalosporins Anaphylaxis  . Penicillins Anaphylaxis  . Codeine Itching  . Gabapentin     "made  me feel drugged"    Past Medical History  Diagnosis Date  . THRUSH 03/18/2009  . HYPERLIPIDEMIA 11/01/2008  . ASTHMA 11/08/2008  . Obstructive sleep apnea   . Coronary artery disease   . Atrial fibrillation     Past Surgical History  Procedure Laterality Date  . Carotid stent      History  Smoking status  . Former Smoker -- 0.50 packs/day for 34 years  . Types: Cigarettes  . Quit date: 10/13/2013  Smokeless tobacco  . Not on file    History  Alcohol Use No    Family History  Problem Relation Age of Onset  . Hyperlipidemia Other   . Heart disease Other   . Heart disease Mother 44    Arrhythmia  . Heart disease Father 6    died 77 CHF     Review of Systems: The review of systems is per the HPI.  All other systems were reviewed and are negative.  Physical Exam: BP 142/70  Pulse 79  Ht 6\' 2"  (1.88 m)  Wt 275 lb 12.8 oz (125.102 kg)  BMI 35.40 kg/m2 Patient is very pleasant and in no acute distress. He looks like he does not feel good - looks fatigued. Skin is warm and dry. Color is normal.  HEENT is unremarkable. Normocephalic/atraumatic. PERRL. Sclera are nonicteric. Neck is supple. No masses. No JVD. Lungs are clear. Cardiac exam shows an irregular rate and rhythm. Rate is a little fast at the time of my exam.  Abdomen is soft. Extremities are without edema. Gait and ROM are intact. No gross neurologic deficits noted.  LABORATORY DATA: EKG with AF/flutter with a more controlled VR. Reviewed with Dr. [name].   Lab Results  Component Value Date   WBC 11.1* 10/05/2013   HGB 17.2* 10/05/2013   HCT 46.5 10/05/2013   PLT 237 10/05/2013   GLUCOSE 236* 10/05/2013   CHOL 116 01/27/2013   TRIG 187.0* 01/27/2013   HDL 32.70* 01/27/2013   LDLDIRECT 44.4 01/21/2012   LDLCALC 46 01/27/2013   ALT 38 01/27/2013   AST 22 01/27/2013   NA 138 10/05/2013   K 4.3 10/05/2013   CL 97 10/05/2013   CREATININE 0.89 10/05/2013   BUN 15 10/05/2013   CO2 20 10/05/2013   TSH 1.30 01/27/2013   PSA 0.38 01/27/2013   INR 1.09 06/03/2012   HGBA1C 7.2* 05/23/2013   MICROALBUR 5.8* 01/27/2013     Assessment / Plan: 1. PAF - now on Multaq - on Xarelto as well. CHADSVAS is at least 3 (DM, vascular disease, LVH on Echo reflecting HTN - BP is up here today)  Still going in and out of rhythm. We do not feel he has been on his Multaq long enough to call this a failure of medication yet. I have talked with Dr. [name] as well - will increase the Cardizem to 240 mg a day. Update his Myoview - do not think he is able to walk due to feeling so poorly in general and in AF today. Will try to get his echo moved up.   2. HTN - CCB increased today  3. DM  4. CAD - with prior  remote PCI and known residual disease - will update the Myoview.  Further disposition to follow. I have given him a note to be out of work this week if needed.  Patient is agreeable to this plan and will call if any problems develop in the interim.   Burtis Junes,  RN, [place] [address] [place], [place]  [address] [phone]

## 2013-10-16 NOTE — Patient Instructions (Signed)
We are going to arrange for a Lexiscan  We will try to move up your appointment for your echo - ok to send to [facility]  Try to move visit with Dr. [name] up if possible  I have given you a note to be out of work for this week  Increase your Diltiazem to 240 mg each day - this is at the drug store  Please call if you have any problems, concerns or change in symptoms - [phone]

## 2013-10-16 NOTE — Telephone Encounter (Signed)
**Note De-Identified Maressa Apollo Obfuscation** The pt states that he continues to be in and out of atrial fibrillation and is not feeling well. I have scheduled the pt an appt for today at 2 pm with Truitt Merle, NP, the pt is in agreement with plan and is aware of appt time.

## 2013-10-17 ENCOUNTER — Ambulatory Visit (HOSPITAL_COMMUNITY): Payer: 59 | Attending: Cardiology | Admitting: Radiology

## 2013-10-17 ENCOUNTER — Encounter: Payer: Self-pay | Admitting: Cardiology

## 2013-10-17 VITALS — BP 136/77 | Ht 74.0 in | Wt 268.0 lb

## 2013-10-17 DIAGNOSIS — R5381 Other malaise: Secondary | ICD-10-CM | POA: Insufficient documentation

## 2013-10-17 DIAGNOSIS — R0602 Shortness of breath: Secondary | ICD-10-CM

## 2013-10-17 DIAGNOSIS — E119 Type 2 diabetes mellitus without complications: Secondary | ICD-10-CM | POA: Insufficient documentation

## 2013-10-17 DIAGNOSIS — R5383 Other fatigue: Secondary | ICD-10-CM

## 2013-10-17 DIAGNOSIS — R079 Chest pain, unspecified: Secondary | ICD-10-CM | POA: Insufficient documentation

## 2013-10-17 DIAGNOSIS — I4891 Unspecified atrial fibrillation: Secondary | ICD-10-CM

## 2013-10-17 DIAGNOSIS — I259 Chronic ischemic heart disease, unspecified: Secondary | ICD-10-CM

## 2013-10-17 DIAGNOSIS — R002 Palpitations: Secondary | ICD-10-CM | POA: Insufficient documentation

## 2013-10-17 DIAGNOSIS — Z87891 Personal history of nicotine dependence: Secondary | ICD-10-CM | POA: Insufficient documentation

## 2013-10-17 DIAGNOSIS — Z8249 Family history of ischemic heart disease and other diseases of the circulatory system: Secondary | ICD-10-CM | POA: Insufficient documentation

## 2013-10-17 MED ORDER — TECHNETIUM TC 99M SESTAMIBI GENERIC - CARDIOLITE
10.8000 | Freq: Once | INTRAVENOUS | Status: AC | PRN
  Administered 2013-10-17: 11 via INTRAVENOUS

## 2013-10-17 MED ORDER — REGADENOSON 0.4 MG/5ML IV SOLN
0.4000 mg | Freq: Once | INTRAVENOUS | Status: AC
  Administered 2013-10-17: 0.4 mg via INTRAVENOUS

## 2013-10-17 MED ORDER — TECHNETIUM TC 99M SESTAMIBI GENERIC - CARDIOLITE
33.0000 | Freq: Once | INTRAVENOUS | Status: AC | PRN
  Administered 2013-10-17: 33 via INTRAVENOUS

## 2013-10-17 NOTE — Progress Notes (Signed)
[facility] NUCLEAR MED [address] [place], [place] [address] [phone]    Cardiology Nuclear Med Study  Eric Palmer is a 58 y.o. male     MRN : [id number]     DOB: [birth year]  Procedure Date: 10/17/2013  Nuclear Med Background Indication for Stress Test:  Evaluation for Ischemia, Stent Patency and Patient seen in hospital on 10-06-2013 for Chest Pain, enzymes (-) History:  Stent RCA;'13 Echo: EF= 55-60%;'14 MPI:EF=69%,scar and Atrial Fib Cardiac Risk Factors: Carotid Disease, Family History - CAD, History of Smoking, Lipids and NIDDM  Symptoms:  Chest Pain with Exertion (last date of chest discomfort yesterday), Fatigue, Palpitations and SOB with Atrial Fib   Nuclear Pre-Procedure Caffeine/Decaff Intake:  None > 12 hrs NPO After: 8:30pm   Lungs:  clear O2 Sat: 96% on room air. IV 0.9% NS with Angio Cath:  22g  IV Site: R Antecubital x 1, tolerated well IV Started by:  Irven Baltimore, RN  Chest Size (in):  54 Cup Size: n/a  Height: 6\' 2"  (1.88 m)  Weight:  268 lb (121.564 kg)  BMI:  Body mass index is 34.39 kg/(m^2). Tech Comments:  No medications today. Patient had symptoms with infusion of Lexiscan and EKG changes. Patient discharged.    Nuclear Med Study 1 or 2 day study: 1 day  Stress Test Type:  Treadmill/Lexiscan  Reading MD: N/A  Order Authorizing Provider:  Daneen Schick, III, MD, and Truitt Merle, NP  Resting Radionuclide: Technetium 63m Sestamibi  Resting Radionuclide Dose: 10.8 mCi   Stress Radionuclide:  Technetium 13m Sestamibi  Stress Radionuclide Dose: 33. mCi           Stress Protocol Rest HR: 66 Stress HR: 107  Rest BP: 136/77 Stress BP: 118/84  Exercise Time (min): 2:00 METS: 1.6   Predicted Max HR: 164 bpm % Max HR: 65.24 bpm Rate Pressure Product: 15408   Dose of Adenosine (mg):  n/a Dose of Lexiscan: 0.4 mg  Dose of Atropine (mg): n/a Dose of Dobutamine: n/a mcg/kg/min (at max HR)  Stress Test Technologist: Matilde Haymaker, RN  Nuclear Technologist:  Vedia Pereyra, CNMT     Rest Procedure:  Myocardial perfusion imaging was performed at rest 45 minutes following the intravenous administration of Technetium 73m Sestamibi. Rest ECG: NSR - Normal EKG  Stress Procedure:  The patient received IV Lexiscan 0.4 mg over 15-seconds with concurrent low level exercise and then Technetium 64m Sestamibi was injected at 30-seconds while the patient continued walking one more minute. Patient had chest tightness 4/10,lightheaded with infusion. Quantitative spect images were obtained after a 45-minute delay. Kathrene Alu consulted with images and EKG changes. Stress ECG: No significant change from baseline ECG  QPS Raw Data Images:  Normal; mild  motion artifact; normal heart/lung ratio. Stress Images:  There is a small area of  mild apical thinning at the apex.   There is also a medium sized mild defect in the mid and basal inferior wall.      Rest Images:  There is small area of very mild apical thinning at the apex.  There is a medium sized mild defect in the mid and basal inferior wall.        Subtraction (SDS):  There is very small area of reversible attenuation at the apex.  I suspect this is just due to apical thinning but I cannot rule out a small area of apical ischemia.  Transient Ischemic Dilatation (Normal <1.22):  0.98  Lung/Heart Ratio (Normal <0.45):  0.35  Quantitative Gated Spect Images QGS EDV:  112 ml QGS ESV:  52 ml  Impression Exercise Capacity:  Lexiscan with low level exercise. BP Response:  Normal blood pressure response. Clinical Symptoms:  No significant symptoms noted. ECG Impression:  No significant ST segment change suggestive of ischemia. Comparison with Prior Nuclear Study: The mild apical thinning is new since the previous myoview  Overall Impression:  Low risk stress nuclear study .  There is a small, mild defect in the apex that may just be due to apical thinning but I cannot  rule out a small area of apical ischemia.  The SDS = 4.  .  LV Ejection Fraction: 53%.  LV Wall Motion:  NL LV Function; NL Wall Motion.   [name], Brooke Bonito., MD, [facility] 10/18/2013, 10:04 AM Office - [phone] Pager [phone]

## 2013-10-24 ENCOUNTER — Ambulatory Visit (HOSPITAL_COMMUNITY): Payer: 59 | Attending: Physician Assistant | Admitting: Cardiology

## 2013-10-24 DIAGNOSIS — J449 Chronic obstructive pulmonary disease, unspecified: Secondary | ICD-10-CM

## 2013-10-24 DIAGNOSIS — I4891 Unspecified atrial fibrillation: Secondary | ICD-10-CM | POA: Insufficient documentation

## 2013-10-24 DIAGNOSIS — I2510 Atherosclerotic heart disease of native coronary artery without angina pectoris: Secondary | ICD-10-CM

## 2013-10-24 NOTE — Progress Notes (Signed)
Echo performed. 

## 2013-10-25 ENCOUNTER — Telehealth: Payer: Self-pay | Admitting: *Deleted

## 2013-10-25 ENCOUNTER — Encounter: Payer: Self-pay | Admitting: Internal Medicine

## 2013-10-25 ENCOUNTER — Ambulatory Visit (INDEPENDENT_AMBULATORY_CARE_PROVIDER_SITE_OTHER): Payer: 59 | Admitting: Internal Medicine

## 2013-10-25 VITALS — BP 130/86 | HR 77 | Ht 74.0 in | Wt 275.2 lb

## 2013-10-25 DIAGNOSIS — I4891 Unspecified atrial fibrillation: Secondary | ICD-10-CM

## 2013-10-25 DIAGNOSIS — I2510 Atherosclerotic heart disease of native coronary artery without angina pectoris: Secondary | ICD-10-CM

## 2013-10-25 DIAGNOSIS — G4733 Obstructive sleep apnea (adult) (pediatric): Secondary | ICD-10-CM

## 2013-10-25 NOTE — Patient Instructions (Signed)
Your physician has recommended you make the following change in your medication:  1) Stop Multaq   Admit Friday for Tikosyn Load---Sally Hedy Camara, Pharm D will call you

## 2013-10-25 NOTE — Telephone Encounter (Signed)
pt notified of echo results; error made in last message that says myoview; pt had echo not myoview. Pt verbalized understanding to results

## 2013-10-25 NOTE — Telephone Encounter (Signed)
lmptcb for myoview results 

## 2013-10-27 ENCOUNTER — Inpatient Hospital Stay (HOSPITAL_COMMUNITY)
Admission: AD | Admit: 2013-10-27 | Discharge: 2013-10-30 | DRG: 310 | Disposition: A | Discharge status: Home / Self Care | Payer: 59 | Source: Ambulatory Visit | Attending: Internal Medicine | Admitting: Internal Medicine

## 2013-10-27 ENCOUNTER — Encounter (HOSPITAL_COMMUNITY): Payer: Self-pay | Admitting: General Practice

## 2013-10-27 ENCOUNTER — Other Ambulatory Visit: Payer: Self-pay

## 2013-10-27 ENCOUNTER — Ambulatory Visit (INDEPENDENT_AMBULATORY_CARE_PROVIDER_SITE_OTHER): Payer: 59 | Admitting: Pharmacist

## 2013-10-27 ENCOUNTER — Encounter: Payer: Self-pay | Admitting: Internal Medicine

## 2013-10-27 DIAGNOSIS — E119 Type 2 diabetes mellitus without complications: Secondary | ICD-10-CM

## 2013-10-27 DIAGNOSIS — I2510 Atherosclerotic heart disease of native coronary artery without angina pectoris: Secondary | ICD-10-CM

## 2013-10-27 DIAGNOSIS — I4892 Unspecified atrial flutter: Secondary | ICD-10-CM | POA: Diagnosis present

## 2013-10-27 DIAGNOSIS — E785 Hyperlipidemia, unspecified: Secondary | ICD-10-CM

## 2013-10-27 DIAGNOSIS — E8881 Metabolic syndrome: Secondary | ICD-10-CM

## 2013-10-27 DIAGNOSIS — E669 Obesity, unspecified: Secondary | ICD-10-CM

## 2013-10-27 DIAGNOSIS — Z9861 Coronary angioplasty status: Secondary | ICD-10-CM

## 2013-10-27 DIAGNOSIS — Z7901 Long term (current) use of anticoagulants: Secondary | ICD-10-CM

## 2013-10-27 DIAGNOSIS — J309 Allergic rhinitis, unspecified: Secondary | ICD-10-CM

## 2013-10-27 DIAGNOSIS — J45909 Unspecified asthma, uncomplicated: Secondary | ICD-10-CM

## 2013-10-27 DIAGNOSIS — I4891 Unspecified atrial fibrillation: Secondary | ICD-10-CM

## 2013-10-27 DIAGNOSIS — T887XXA Unspecified adverse effect of drug or medicament, initial encounter: Secondary | ICD-10-CM

## 2013-10-27 DIAGNOSIS — I480 Paroxysmal atrial fibrillation: Secondary | ICD-10-CM | POA: Diagnosis present

## 2013-10-27 DIAGNOSIS — F17200 Nicotine dependence, unspecified, uncomplicated: Secondary | ICD-10-CM

## 2013-10-27 DIAGNOSIS — J449 Chronic obstructive pulmonary disease, unspecified: Secondary | ICD-10-CM

## 2013-10-27 DIAGNOSIS — E1142 Type 2 diabetes mellitus with diabetic polyneuropathy: Secondary | ICD-10-CM | POA: Diagnosis present

## 2013-10-27 DIAGNOSIS — G4733 Obstructive sleep apnea (adult) (pediatric): Secondary | ICD-10-CM

## 2013-10-27 DIAGNOSIS — E1149 Type 2 diabetes mellitus with other diabetic neurological complication: Secondary | ICD-10-CM | POA: Diagnosis present

## 2013-10-27 DIAGNOSIS — Z79899 Other long term (current) drug therapy: Secondary | ICD-10-CM

## 2013-10-27 HISTORY — DX: Obstructive sleep apnea (adult) (pediatric): Z99.89

## 2013-10-27 HISTORY — DX: Obstructive sleep apnea (adult) (pediatric): G47.33

## 2013-10-27 HISTORY — DX: Unspecified chronic bronchitis: J42

## 2013-10-27 LAB — BASIC METABOLIC PANEL
BUN: 10 mg/dL (ref 6–23)
CO2: 28 mEq/L (ref 19–32)
Calcium: 9.1 mg/dL (ref 8.4–10.5)
Chloride: 103 mEq/L (ref 96–112)
Creatinine, Ser: 1 mg/dL (ref 0.4–1.5)
GFR: 85.87 mL/min (ref >60.00)
Glucose, Bld: 207 mg/dL — ABNORMAL HIGH (ref 70–99)
Potassium: 4 mEq/L (ref 3.5–5.1)
Sodium: 138 mEq/L (ref 135–145)

## 2013-10-27 LAB — MAGNESIUM: Magnesium: 1.8 mg/dL (ref 1.5–2.5)

## 2013-10-27 MED ORDER — DOFETILIDE 500 MCG PO CAPS
500.0000 ug | ORAL_CAPSULE | Freq: Two times a day (BID) | ORAL | Status: DC
  Administered 2013-10-27 – 2013-10-30 (×6): 500 ug via ORAL
  Filled 2013-10-27 (×8): qty 1

## 2013-10-27 MED ORDER — RIVAROXABAN 20 MG PO TABS
20.0000 mg | ORAL_TABLET | Freq: Every day | ORAL | Status: DC
  Filled 2013-10-27: qty 1

## 2013-10-27 MED ORDER — DILTIAZEM HCL ER COATED BEADS 240 MG PO CP24
240.0000 mg | ORAL_CAPSULE | Freq: Every day | ORAL | Status: DC
  Administered 2013-10-27 – 2013-10-29 (×3): 240 mg via ORAL
  Filled 2013-10-27 (×4): qty 1

## 2013-10-27 MED ORDER — ATORVASTATIN CALCIUM 40 MG PO TABS
40.0000 mg | ORAL_TABLET | Freq: Every day | ORAL | Status: DC
  Administered 2013-10-27: 40 mg via ORAL
  Filled 2013-10-27 (×2): qty 1

## 2013-10-27 MED ORDER — SODIUM CHLORIDE 0.9 % IJ SOLN: 3.0000 mL | INTRAMUSCULAR | Status: DC | PRN

## 2013-10-27 MED ORDER — RIVAROXABAN 20 MG PO TABS
20.0000 mg | ORAL_TABLET | Freq: Every day | ORAL | Status: DC
  Administered 2013-10-28 – 2013-10-30 (×3): 20 mg via ORAL
  Filled 2013-10-27 (×4): qty 1

## 2013-10-27 MED ORDER — METFORMIN HCL 500 MG PO TABS
500.0000 mg | ORAL_TABLET | Freq: Two times a day (BID) | ORAL | Status: DC
  Administered 2013-10-28 – 2013-10-30 (×5): 500 mg via ORAL
  Filled 2013-10-27 (×7): qty 1

## 2013-10-27 MED ORDER — MOMETASONE FURO-FORMOTEROL FUM 100-5 MCG/ACT IN AERO
2.0000 | INHALATION_SPRAY | Freq: Two times a day (BID) | RESPIRATORY_TRACT | Status: DC
  Administered 2013-10-28 – 2013-10-29 (×2): 2 via RESPIRATORY_TRACT
  Filled 2013-10-27: qty 8.8

## 2013-10-27 MED ORDER — MONTELUKAST SODIUM 10 MG PO TABS
10.0000 mg | ORAL_TABLET | Freq: Every day | ORAL | Status: DC
  Administered 2013-10-28 – 2013-10-29 (×2): 10 mg via ORAL
  Filled 2013-10-27 (×4): qty 1

## 2013-10-27 MED ORDER — SODIUM CHLORIDE 0.9 % IV SOLN: 250.0000 mL | INTRAVENOUS | Status: DC | PRN

## 2013-10-27 MED ORDER — SODIUM CHLORIDE 0.9 % IJ SOLN
3.0000 mL | Freq: Two times a day (BID) | INTRAMUSCULAR | Status: DC
Start: 2013-10-27 — End: 2013-10-30
  Administered 2013-10-27 – 2013-10-29 (×5): 3 mL via INTRAVENOUS

## 2013-10-27 NOTE — Progress Notes (Signed)
  History of Present Illness: Eric Palmer is seen today for Tikosyn initiation. He has a history of CAD, status post PCI in 1996, paroxysmal atrial fibrillation, diabetes, OSA, tobacco abuse. Patient was first diagnosed with atrial fibrillation 05/2012. He converted to NSR with IV diltiazem and reports no incidence of atrial fibrillation until this February. He was seen in the emergency room 10/06/13 and was found to be in atrial fibrillation with RVR with heart rates in the 160s. He converted to NSR on IV diltiazem.  He was seen by [name] PA on 2/20 after calling after hours with palpitations.  At that time he was started on Multaq and ASA changed to Xarelto.  On 2/23, he was seen by Truitt Merle, NP due to palpitations.  In atrial fib/flutter at that time so diltiazem increased.  Dr. [name] consulted with pt on 3/4.   Pt reports he feels like he is in normal rhythm in the AM but will go into afib at night.  Multaq was stopped at that visit in anticipation of starting Tikosyn today.    Reviewed pt's medication list.  He took his last dose of Multaq the AM of 3/4.  He started Xarelto 20mg  daily on 2/20 and reports compliance since that time.  He is currently not taking any other QTc prolongating or contraindicated medications.    Discussed potential side effects with pt including QTc prolongation.  He is aware to call the office if he misses more than 2 doses in a row.  Pt was given a discount card to carry to pharmacy to help with the copay.    EKG reviewed by Dr. [name].  Aflutter with a ventricular rate of 78 bpm.  QTc- 417msec.    Current Outpatient Prescriptions  Medication Sig Dispense Refill  . atorvastatin (LIPITOR) 40 MG tablet Take 40 mg by mouth daily.      [name] diltiazem (CARDIZEM CD) 240 MG 24 hr capsule Take 1 capsule (240 mg total) by mouth daily.  30 capsule  6  . fish oil-omega-3 fatty acids 1000 MG capsule Take 1 g by mouth 2 (two) times daily.       . Fluticasone-Salmeterol  (ADVAIR) 100-50 MCG/DOSE AEPB Inhale 1 puff into the lungs at bedtime.      . metFORMIN (GLUCOPHAGE) 500 MG tablet Take 500 mg by mouth 2 (two) times daily with a meal.      . montelukast (SINGULAIR) 10 MG tablet Take 10 mg by mouth at bedtime.      . Rivaroxaban (XARELTO) 20 MG TABS tablet Take 1 tablet (20 mg total) by mouth daily with supper.  30 tablet  11   No current facility-administered medications for this visit.    Allergies  Allergen Reactions  . Cephalosporins Anaphylaxis  . Penicillins Anaphylaxis  . Codeine Itching  . Gabapentin     "made me feel drugged"

## 2013-10-27 NOTE — Progress Notes (Signed)
Pharmacy Consult for Dofetilide (Tikosyn) Iniation  Admit Complaint: 58 y.o. male admitted 10/27/2013 with atrial fibrillation to be initiated on dofetilide.   Assessment:  Patient Exclusion Criteria: If any screening criteria checked as "Yes", then  patient  should NOT receive dofetilide until criteria item is corrected. If "Yes" please indicate correction plan.  YES  NO Patient  Exclusion Criteria Correction Plan  []  [x]  Baseline QTc interval is greater than or equal to 440 msec. IF above YES box checked dofetilide contraindicated unless patient has ICD; then may proceed if QTc 500-550 msec or with known ventricular conduction abnormalities may proceed with QTc 550-600 msec. QTc = 405 msec today per clinic note   []  [x]  Magnesium level is less than 1.8 mEq/l : Last magnesium:  Lab Results  Component Value Date   MG 1.8 10/27/2013         []  [x]  Potassium level is less than 4 mEq/l : Last potassium:  Lab Results  Component Value Date   K 4.0 10/27/2013         []  [x]  Patient is known or suspected to have a digoxin level greater than 2 ng/ml: No results found for this basename: DIGOXIN   No digoxin PTA   []  [x]  Creatinine clearance less than 20 ml/min (calculated using Cockcroft-Gault, actual body weight and serum creatinine): Estimated Creatinine Clearance: 115.7 ml/min (by C-G formula based on Cr of 1).    []  [x]  Patient has received drugs known to prolong the QT intervals within the last 48 hours(phenothiazines, tricyclics or tetracyclic antidepressants, erythromycin, H-1 antihistamines, cisapride, fluoroquinolones, azithromycin). Drugs not listed above may have an, as yet, undetected potential to prolong the QT interval, updated information on QT prolonging agents is available at this website:QT prolonging agents   []  [x]  Patient received a dose of hydrochlorothiazide (Oretic) alone or in any combination including triamterene (Dyazide, Maxzide) in the last 48 hours.   []  [x]  Patient  received a medication known to increase dofetilide plasma concentrations prior to initial dofetilide dose:    Trimethoprim (Primsol, Proloprim) in the last 36 hours   Verapamil (Calan, Verelan) in the last 36 hours or a sustained release dose in the last 72 hours   Megestrol (Megace) in the last 5 days    Cimetidine (Tagamet) in the last 6 hours   Ketoconazole (Nizoral) in the last 24 hours   Itraconazole (Sporanox) in the last 48 hours    Prochlorperazine (Compazine) in the last 36 hours    []  [x]  Patient is known to have a history of torsades de pointes; congenital or acquired long QT syndromes.   []  [x]  Patient has received a Class 1 antiarrhythmic with less than 2 half-lives since last dose. (Disopyramide, Quinidine, Procainamide, Lidocaine, Mexiletine, Flecainide, Propafenone)   []  [x]  Patient has received amiodarone therapy in the past 3 months or amiodarone level is greater than 0.3 ng/ml.    Patient has been appropriately anticoagulated with Xarelto 20mg  daily.  Ordering provider was confirmed at [email or web address] if they are not listed on the [facility] Prescribers list.  Goal of Therapy:  Follow renal function, electrolytes, potential drug interactions, and dose adjustment. Provide education and 1 week supply at discharge.  Plan:  1.  Initiate dofetilide based on renal function: Select One Calculated CrCl  Dose q12h  [x]  > 60 ml/min 500 mcg  []  40-60 ml/min 250 mcg  []  20-40 ml/min 125 mcg   2. Follow up QTc after the first 5 doses, renal function, electrolytes (  K & Mg) daily x 3     days, dose adjustment, success of initiation and facilitate 1 week discharge supply as     clinically indicated.  3. Initiate Tikosyn education video (Call [phone] and ask for video # [phone]).   4. Please consider starting PO Mg and K supplement   Maryanna Shape, PharmD, BCPS  Clinical Pharmacist  Pager: [phone]   5:26 PM 10/27/2013

## 2013-10-27 NOTE — H&P (Signed)
Eric Palmer is a 58 y.o. male with a h/o CAD, status post PCI in 1996, paroxysmal atrial fibrillation, diabetes, OSA, tobacco abuse. Patient was first diagnosed with atrial fibrillation 05/2012. He converted to NSR with IV diltiazem and reports no incidence of atrial fibrillation until this February. He was seen in the emergency room 10/06/13 and was found to be in atrial fibrillation with RVR with heart rates in the 160s. He converted to NSR on IV diltiazem. He was seen by [name] PA on 2/20 after calling after hours with palpitations. At that time he was started on Multaq and ASA changed to Xarelto. On 2/23, he was seen by Truitt Merle, NP due to palpitations. In atrial fib/flutter at that time so diltiazem increased. Pt reports he feels like he is in normal rhythm in the AM but will go into afib at night.  Reviewed pt's medication list. He started Xarelto 20mg  daily on 2/20 and reports compliance since that time. He is currently not taking any other QTc prolongating or contraindicated medications.   Past Medical History   Diagnosis  Date   .  THRUSH  03/18/2009   .  HYPERLIPIDEMIA  11/01/2008   .  ASTHMA  11/08/2008   .  Obstructive sleep apnea    .  Coronary artery disease    .  Paroxysmal atrial fibrillation    .  Diabetes    .  Pure hypercholesterolemia    .  Coronary atherosclerosis of native coronary artery    .  Internal hemorrhoids without mention of complication    .  ED (erectile dysfunction)    .  Obesity    .  Anal fissure    .  Anal or rectal pain    .  Paresthesia of both legs    .  Smoker      QUIT 10/13/13   .  DM type 2, uncontrolled, with neuropathy    .  Hx of echocardiogram      Echo (10/2013): Mild LVH, moderate focal basal hypertrophy of the septum, EF 50-55%, no RWMA    Past Surgical History   Procedure  Laterality  Date   .  Carotid stent     .  Coronary angioplasty with stent placement   [place], residual 50% LAD and RCA by cath in 2007   .   Cardiac catheterization   2007    No current outpatient prescriptions on file.    No current facility-administered medications for this visit.    Allergies   Allergen  Reactions   .  Cephalosporins  Anaphylaxis   .  Penicillins  Anaphylaxis   .  Codeine  Itching   .  Gabapentin      "made me feel drugged"    History    Social History   .  Marital Status:  Married     Spouse Name:  N/A     Number of Children:  N/A   .  Years of Education:  N/A    Occupational History   .  Not on file.    Social History Main Topics   .  Smoking status:  Former Smoker -- 0.50 packs/day for 34 years     Types:  Cigarettes     Quit date:  10/13/2013   .  Smokeless tobacco:  Not on file   .  Alcohol Use:  Yes      Comment: Rare, beer   .  Drug Use:  No   .  Sexual Activity:  Not Currently    Other Topics  Concern   .  Not on file    Social History Narrative    Works as an Programme researcher, broadcasting/film/video for [employer] History   Problem  Relation  Age of Onset   .  Hyperlipidemia  Other    .  Heart disease  Other    .  Heart disease  Mother  2     Arrhythmia   .  Heart disease  Father  41     died 58 CHF   ROS- All systems are reviewed and negative except as per the HPI above  Physical Exam:  Filed Vitals:    Filed Vitals:   10/27/13 1606  BP: 140/85  Pulse: 78  Temp: 98.8 F (37.1 C)  Resp: 17   GEN- The patient is well appearing, alert and oriented x 3 today.  Head- normocephalic, atraumatic  Eyes- Sclera clear, conjunctiva pink  Ears- hearing intact  Oropharynx- clear  Neck- supple, no JVP  Lymph- no cervical lymphadenopathy  Lungs- Clear to ausculation bilaterally, normal work of breathing  Heart- irregular rate and rhythm, no murmurs, rubs or gallops, PMI not laterally displaced  GI- soft, NT, ND, + BS  Extremities- no clubbing, cyanosis, or edema  MS- no significant deformity or atrophy  Skin- no rash or lesion  Psych- euthymic mood, full affect  Neuro- strength and  sensation are intact  EKG- reviewed  QTc is stable Epic records are reviewed   10/24/13 echo is reviewed  Assessment and Plan:   1. afib  He is admitted for monitoring while initiating tikosyn. We will start tikosyn and monitor He is appropriately anticoagulated with xarelto.  2. OSA Compliance with cpap is encouraged   3. CAD  Stable  No change required today

## 2013-10-27 NOTE — Progress Notes (Signed)
Patient here from home oriented to room and waiting for further orders from MD. Dr. [name] on unit and made aware that patient has arrived

## 2013-10-27 NOTE — Progress Notes (Signed)
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 Patient was first diagnosed with atrial fibrillation 05/2012. He converted to NSR with IV diltiazem and reports no incidence of atrial fibrillation until this February. He was seen in the emergency room 10/06/13 and was found to be in atrial fibrillation with RVR with heart rates in the 160s. He converted to NSR on IV diltiazem. He was seen by [name] PA on 2/20 after calling after hours with palpitations. At that time he was started on Multaq and ASA changed to Xarelto. On 2/23, he was seen by Truitt Merle, NP due to palpitations. In atrial fib/flutter at that time so diltiazem increased.   Pt reports he feels like he is in normal rhythm in the AM but will go into afib at night.   Reviewed pt's medication list.  He started Xarelto 20mg  daily on 2/20 and reports compliance since that time. He is currently not taking any other QTc prolongating or contraindicated medications.    Past Medical History  Diagnosis Date  . THRUSH 03/18/2009  . HYPERLIPIDEMIA 11/01/2008  . ASTHMA 11/08/2008  . Obstructive sleep apnea   . Coronary artery disease   . Paroxysmal atrial fibrillation   . Diabetes   . Pure hypercholesterolemia   . Coronary atherosclerosis of native coronary artery   . Internal hemorrhoids without mention of complication   . ED (erectile dysfunction)   . Obesity   . Anal fissure   . Anal or rectal pain   . Paresthesia of both legs   . Smoker     QUIT 10/13/13  . DM type 2, uncontrolled, with neuropathy   . Hx of echocardiogram     Echo (10/2013): Mild LVH, moderate focal basal hypertrophy of the septum, EF 50-55%, no RWMA   Past Surgical History  Procedure Laterality Date  . Carotid stent    . Coronary angioplasty with stent placement  [place], residual  50% LAD and RCA by cath in 2007  . Cardiac catheterization  2007    No current outpatient prescriptions on file.   No current facility-administered medications for this visit.    Allergies  Allergen Reactions  . Cephalosporins Anaphylaxis  . Penicillins Anaphylaxis  . Codeine Itching  . Gabapentin     "made me feel drugged"    History   Social History  . Marital Status: Married    Spouse Name: N/A    Number of Children: N/A  . Years of Education: N/A   Occupational History  . Not on file.   Social History Main Topics  . Smoking status: Former Smoker -- 0.50 packs/day for 34 years    Types: Cigarettes    Quit date: 10/13/2013  . Smokeless tobacco: Not on file  . Alcohol Use: Yes     Comment: Rare, beer  . Drug Use: No  . Sexual Activity: Not Currently   Other Topics Concern  . 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 Hyperlipidemia Other   . Heart disease Other   . Heart disease Mother 49    Arrhythmia  . Heart disease Father 65    died 42 CHF    ROS- All systems are  reviewed and negative except as per the HPI above  Physical Exam: Filed Vitals:   10/25/13 0812  BP: 130/86  Pulse: 77  Height: 6\' 2"  (1.88 m)  Weight: 275 lb 3.2 oz (124.83 kg)    GEN- The patient is well appearing, alert and oriented x 3 today.   Head- normocephalic, atraumatic Eyes-  Sclera clear, conjunctiva pink Ears- hearing intact Oropharynx- clear Neck- supple, no JVP Lymph- no cervical lymphadenopathy Lungs- Clear to ausculation bilaterally, normal work of breathing Heart- irregular rate and rhythm, no murmurs, rubs or gallops, PMI not laterally displaced GI- soft, NT, ND, + BS Extremities- no clubbing, cyanosis, or edema MS- no significant deformity or atrophy Skin- no rash or lesion Psych- euthymic mood, full affect Neuro- strength and sensation are intact  EKG-  reviewed Epic records are reviewed 10/24/13 echo is reviewed  Assessment and Plan:   1. afib The patient has symptomatic atrial fibrillation and atrial flutter. Therapeutic strategies for afib including medicine and ablation were discussed in detail with the patient today.  At this point, he would like to try additional medical therapy.  I have advised tikosyn.  He will therefore be admitted for initiation of tikosyn at the next available time.  He will stop multaq now and allow this to wash out.  Continue anticoagulation with xarelto. If he fails medical therapy with tikosyn then ablation should be considered.  2. OSA Compliance with cpap is encouraged  3. CAD Stable No change required today  Return to see me 4 weeks after tikosyn.

## 2013-10-28 LAB — BASIC METABOLIC PANEL
BUN: 10 mg/dL (ref 6–23)
CO2: 23 mEq/L (ref 19–32)
Calcium: 8.6 mg/dL (ref 8.4–10.5)
Chloride: 101 mEq/L (ref 96–112)
Creatinine, Ser: 0.83 mg/dL (ref 0.50–1.35)
GFR calc Af Amer: >90 mL/min (ref >90)
GFR calc non Af Amer: >90 mL/min (ref >90)
Glucose, Bld: 141 mg/dL — ABNORMAL HIGH (ref 70–99)
Potassium: 3.9 mEq/L (ref 3.7–5.3)
Sodium: 138 mEq/L (ref 137–147)

## 2013-10-28 LAB — MAGNESIUM: Magnesium: 2.1 mg/dL (ref 1.5–2.5)

## 2013-10-28 MED ORDER — ATORVASTATIN CALCIUM 40 MG PO TABS
40.0000 mg | ORAL_TABLET | Freq: Every day | ORAL | Status: DC
  Administered 2013-10-28 – 2013-10-29 (×2): 40 mg via ORAL
  Filled 2013-10-28 (×3): qty 1

## 2013-10-28 NOTE — Progress Notes (Signed)
RT placed patient on CPAP.  Patient mentioned that the pressure last night was to much.  RT decreased lower pressure down to 5cm.  Machine is set to auto titrate CPAP.  Low pressure is 5 and high pressure is 20.

## 2013-10-28 NOTE — Progress Notes (Signed)
QTc is 424 3 hours after first dose of Tikosyn.

## 2013-10-28 NOTE — Progress Notes (Signed)
QTc 414 per EKG done prior to first dose of Tikosyn.

## 2013-10-28 NOTE — Progress Notes (Signed)
   SUBJECTIVE: The patient is doing well today.  At this time, he denies chest pain, shortness of breath, or any new concerns.  Admitted 3-6 for Tikosyn loading - K and Mg stable, QTc 424  . atorvastatin  40 mg Oral Daily  . diltiazem  240 mg Oral Daily  . dofetilide  500 mcg Oral Q12H  . metFORMIN  500 mg Oral BID WC  . mometasone-formoterol  2 puff Inhalation BID  . montelukast  10 mg Oral QHS  . Rivaroxaban  20 mg Oral Q breakfast  . sodium chloride  3 mL Intravenous Q12H      OBJECTIVE: Physical Exam: Filed Vitals:   10/27/13 1606 10/27/13 2119 10/27/13 2351 10/28/13 0503  BP: 140/85 135/75  119/75  Pulse: 78 68 70 74  Temp: 98.8 F (37.1 C) 97.8 F (36.6 C)  97.4 F (36.3 C)  TempSrc: Oral Oral  Oral  Resp: 17 18 18 18   Height: 6\' 2"  (1.88 m)     Weight: 275 lb 3.6 oz (124.84 kg)     SpO2: 97% 97% 98% 98%    Telemetry reveals sinus rhythm with intermittent atrial flutter  GEN- The patient is well appearing, alert and oriented x 3 today.   Head- normocephalic, atraumatic Eyes-  Sclera clear, conjunctiva pink Ears- hearing intact Oropharynx- clear Neck- supple, no JVP Lymph- no cervical lymphadenopathy Lungs- Clear to ausculation bilaterally, normal work of breathing Heart- Regular rate and rhythm, no murmurs, rubs or gallops, PMI not laterally displaced GI- soft, NT, ND, + BS Extremities- no clubbing, cyanosis, or edema Skin- no rash or lesion Psych- euthymic mood, full affect Neuro- strength and sensation are intact  LABS: Basic Metabolic Panel:  Recent Labs  10/27/13 1022 10/28/13 0332  NA 138 138  K 4.0 3.9  CL 103 101  CO2 28 23  GLUCOSE 207* 141*  BUN 10 10  CREATININE 1.0 0.83  CALCIUM 9.1 8.6  MG 1.8 2.1    RADIOLOGY: Dg Chest Port 1 View 10/06/2013   CLINICAL DATA:  Short of breath.  Chest pain.  EXAM: PORTABLE CHEST - 1 VIEW  COMPARISON:  03/11/2013.  FINDINGS: Cardiopericardial silhouette within normal limits. Mediastinal contours  normal. Trachea midline. No airspace disease or effusion. Monitoring leads project over the chest.  IMPRESSION: No active disease.   Electronically Signed   By: Dereck Ligas M.D.   On: 10/06/2013 01:01    ASSESSMENT AND PLAN:  Active Problems:   Atrial fibrillation Atrial flutter Admit for tikosyn Rec: continue tikosyn. DC on 3/9.  Mikle Bosworth.D.

## 2013-10-29 LAB — BASIC METABOLIC PANEL
BUN: 10 mg/dL (ref 6–23)
CO2: 25 mEq/L (ref 19–32)
Calcium: 8.6 mg/dL (ref 8.4–10.5)
Chloride: 98 mEq/L (ref 96–112)
Creatinine, Ser: 0.88 mg/dL (ref 0.50–1.35)
GFR calc Af Amer: >90 mL/min (ref >90)
GFR calc non Af Amer: >90 mL/min (ref >90)
Glucose, Bld: 172 mg/dL — ABNORMAL HIGH (ref 70–99)
Potassium: 3.9 mEq/L (ref 3.7–5.3)
Sodium: 136 mEq/L — ABNORMAL LOW (ref 137–147)

## 2013-10-29 LAB — MAGNESIUM: Magnesium: 1.9 mg/dL (ref 1.5–2.5)

## 2013-10-29 NOTE — Progress Notes (Signed)
PHARMACIST - PHYSICIAN COMMUNICATION DR:   Lovena Le  CONCERNING:  Tikosyn   RECOMMENDATION: K less than 4, Mg less than 2  Please supplement.  Thank you. Anette Guarneri, PharmD

## 2013-10-29 NOTE — Progress Notes (Signed)
EKG post tikosyn dose shows qt/qtc= 398/423 ms.  Pt resting with call bell within reach.  Will continue to monitor. Eric Palmer

## 2013-10-29 NOTE — Progress Notes (Signed)
  Patient Name: [name] Date of Encounter: 10/29/2013  Active Problems:   Atrial fibrillation   Length of Stay: 2  SUBJECTIVE  Feels well, no complaints  CURRENT MEDS . atorvastatin  40 mg Oral q1800  . diltiazem  240 mg Oral Daily  . dofetilide  500 mcg Oral Q12H  . metFORMIN  500 mg Oral BID WC  . mometasone-formoterol  2 puff Inhalation BID  . montelukast  10 mg Oral QHS  . Rivaroxaban  20 mg Oral Q breakfast  . sodium chloride  3 mL Intravenous Q12H    OBJECTIVE   Intake/Output Summary (Last 24 hours) at 10/29/13 1109 Last data filed at 10/29/13 8850  Gross per 24 hour  Intake    960 ml  Output      0 ml  Net    960 ml   Filed Weights   10/27/13 1606  Weight: 124.84 kg (275 lb 3.6 oz)    PHYSICAL EXAM Filed Vitals:   10/28/13 1946 10/28/13 2050 10/28/13 2358 10/29/13 0519  BP: 150/80   117/69  Pulse: 89   73  Temp: 97.9 F (36.6 C)   97.6 F (36.4 C)  TempSrc: Oral   Oral  Resp: 19  16 18   Height:      Weight:      SpO2: 96% 97%  97%   General: Alert, oriented x3, no distress Head: no evidence of trauma, PERRL, EOMI, no exophtalmos or lid lag, no myxedema, no xanthelasma; normal ears, nose and oropharynx Neck: normal jugular venous pulsations and no hepatojugular reflux; brisk carotid pulses without delay and no carotid bruits Chest: clear to auscultation, no signs of consolidation by percussion or palpation, normal fremitus, symmetrical and full respiratory excursions Cardiovascular: normal position and quality of the apical impulse, regular rhythm, normal first and second heart sounds, no rubs or gallops, no murmur Abdomen: no tenderness or distention, no masses by palpation, no abnormal pulsatility or arterial bruits, normal bowel sounds, no hepatosplenomegaly Extremities: no clubbing, cyanosis or edema; 2+ radial, ulnar and brachial pulses bilaterally; 2+ right femoral, posterior tibial and dorsalis pedis pulses; 2+ left femoral, posterior  tibial and dorsalis pedis pulses; no subclavian or femoral bruits Neurological: grossly nonfocal  LABS  CBC No results found for this basename: WBC, NEUTROABS, HGB, HCT, MCV, PLT,  in the last 72 hours Basic Metabolic Panel  Recent Labs  10/28/13 0332 10/29/13 0450  NA 138 136*  K 3.9 3.9  CL 101 98  CO2 23 25  GLUCOSE 141* 172*  BUN 10 10  CREATININE 0.83 0.88  CALCIUM 8.6 8.6  MG 2.1 1.9  Radiology Studies No results found.  TELE NSR, rare PACs  ECG NSR, QTC 441  ASSESSMENT AND PLAN Will finish tikosyn load tonight and be ready for DC tomorrow if QTc remains in target range.Sanda Klein, MD, [facility] [address] [facility] [phone] office [phone] pager 10/29/2013 11:09 AM

## 2013-10-30 DIAGNOSIS — E669 Obesity, unspecified: Secondary | ICD-10-CM

## 2013-10-30 LAB — BASIC METABOLIC PANEL
BUN: 8 mg/dL (ref 6–23)
CO2: 25 mEq/L (ref 19–32)
Calcium: 9.1 mg/dL (ref 8.4–10.5)
Chloride: 101 mEq/L (ref 96–112)
Creatinine, Ser: 0.81 mg/dL (ref 0.50–1.35)
GFR calc Af Amer: >90 mL/min (ref >90)
GFR calc non Af Amer: >90 mL/min (ref >90)
Glucose, Bld: 166 mg/dL — ABNORMAL HIGH (ref 70–99)
Potassium: 4.2 mEq/L (ref 3.7–5.3)
Sodium: 141 mEq/L (ref 137–147)

## 2013-10-30 LAB — MAGNESIUM: Magnesium: 1.9 mg/dL (ref 1.5–2.5)

## 2013-10-30 MED ORDER — DOFETILIDE 500 MCG PO CAPS: 500.0000 ug | ORAL_CAPSULE | Freq: Two times a day (BID) | ORAL | Status: DC

## 2013-10-30 NOTE — Care Management Note (Signed)
    Page 1 of 1   10/30/2013     1:42:41 PM   CARE MANAGEMENT NOTE 10/30/2013  Patient:  Eric Palmer, Eric Palmer   Account Number:  [id number]  Date Initiated:  10/30/2013  Documentation initiated by:  Luz Lex  Subjective/Objective Assessment:   Admitted for initiation of tikosyn     Action/Plan:   Anticipated DC Date:  10/30/2013   Anticipated DC Plan:  [place]  CM consult  Medication Assistance      Choice offered to / List presented to:             Status of service:  In process, will continue to follow Medicare Important Message given?   (If response is "NO", the following Medicare IM given date fields will be blank) Date Medicare IM given:   Date Additional Medicare IM given:    Discharge Disposition:    Per UR Regulation:  Reviewed for med. necessity/level of care/duration of stay  If discussed at [place] of Stay Meetings, dates discussed:    Comments:  10-30-13 11:10am Luz Lex, RNBSN [phone] Will need prescription for 7 days of tikosyn.  Pharmacy is [facility].  [phone]. Benefit running for cost after first 7 days.  Called drug store - do not have in stock but will order once they have presciption - will be in before 7 days. PT COPAY EST @ $20 -PRIOR AUTH REQUIRED-PA#[id number]

## 2013-10-30 NOTE — Discharge Instructions (Signed)
DO NOT start any new medications (over-the-counter or prescription) without notifying Dr. [name] and/or Elberta Leatherwood, PharmD.

## 2013-10-30 NOTE — Progress Notes (Signed)
Reviewed discharge instructions with patient , verbalized understanding of medication and times and follow up , given 7 day supply of tikosyn Joylene Draft A

## 2013-10-30 NOTE — Discharge Summary (Signed)
   ELECTROPHYSIOLOGY DISCHARGE SUMMARY   Patient ID: Eric Palmer,  MRN: [id number], DOB/AGE: 57-Aug-1957 57 y.o.  Admit date: 10/27/2013 Discharge date: 10/30/2013  Primary Care Physician: Carolann Littler, MD Primary Cardiologist: Daneen Schick, MD Primary EP: Thompson Grayer, MD  Primary Discharge Diagnosis:  1. Atrial fibrillation, now in SR s/p Tikosyn loading  Secondary Discharge Diagnoses:  1. CAD s/p PCI in 1996 2. OSA 3. DM 4. Tobacco abuse  Procedures This Admission:  None  History and Hospital Course:  [name] is a 58 year old man with CAD, PAF, OSA and DM who presented 10/27/2013 for elective admission for AAD drug loading with Tikosyn. He was started on Tikosyn 500 micrograms every 12 hours per protocol. He converted to SR and remains hemodynamically stable. Renal function, potassium and magnesium are within normal range. QTc is stable. He has been seen, examined and deemed stable for discharge today by Dr. Thompson Grayer. He will return to the office in one week for Tikosyn follow-up with Elberta Leatherwood, PharmD and will see Dr. [name] in 4 weeks.  Discharge Vitals: Blood pressure 111/76, pulse 75, temperature 98.4 F (36.9 C), temperature source Oral, resp. rate 20, height 6\' 2"  (1.88 m), weight 275 lb 3.6 oz (124.84 kg), SpO2 95.00%.   Labs:   Recent Labs Lab 10/30/13 0709  NA 141  K 4.2  CL 101  CO2 25  BUN 8  CREATININE 0.81  CALCIUM 9.1  GLUCOSE 166*  Magnesium 1.9  Disposition:  The patient is being discharged in stable condition.  Follow-up:     Follow-up Information   Follow up with [facility] On 11/06/2013. (At 8:30 AM for Tikosyn follow-up with Elberta Leatherwood, PharmD)    Specialty:  Cardiology   Contact information:   [address], [address] [place] [address] [phone]      Follow up with Thompson Grayer, MD On 12/08/2013. (At 9:15 AM)    Specialty:  Cardiology   Contact information:   [address] [place] [address] [phone]      Discharge Medications:    Medication List         atorvastatin 40 MG tablet  Commonly known as:  LIPITOR  Take 40 mg by mouth daily with supper.     diltiazem 240 MG 24 hr capsule  Commonly known as:  CARDIZEM CD  Take 240 mg by mouth daily with supper.     dofetilide 500 MCG capsule  Commonly known as:  TIKOSYN  Take 1 capsule (500 mcg total) by mouth every 12 (twelve) hours.     fish oil-omega-3 fatty acids 1000 MG capsule  Take 1 g by mouth 2 (two) times daily.     Fluticasone-Salmeterol 100-50 MCG/DOSE Aepb  Commonly known as:  ADVAIR  Inhale 1 puff into the lungs at bedtime.     metFORMIN 500 MG tablet  Commonly known as:  GLUCOPHAGE  Take 500 mg by mouth 2 (two) times daily with a meal. With breakfast and supper     montelukast 10 MG tablet  Commonly known as:  SINGULAIR  Take 10 mg by mouth daily with supper.     XARELTO 20 MG Tabs tablet  Generic drug:  Rivaroxaban  Take 20 mg by mouth daily with breakfast.       Duration of Discharge Encounter: Greater than 30 minutes including physician time.  Manson Passey, PA-C 10/30/2013, 1:44 PM   Thompson Grayer MD

## 2013-10-30 NOTE — Progress Notes (Signed)
    Patient: [name] Date of Encounter: 10/30/2013, 7:34 AM Admit date: 10/27/2013     Subjective  Mr. Sperl has no complaints.    Objective  Physical Exam: Vitals: BP 141/80  Pulse 82  Temp(Src) 98.5 F (36.9 C) (Oral)  Resp 18  Ht 6\' 2"  (1.88 m)  Wt 275 lb 3.6 oz (124.84 kg)  BMI 35.32 kg/m2  SpO2 97% General: Well developed, well appearing 58 year old male in no acute distress. Neck: Supple. JVD not elevated. Lungs: Clear bilaterally to auscultation without wheezes, rales, or rhonchi. Breathing is unlabored. Heart: RRR S1 S2 without murmurs, rubs, or gallops.  Abdomen: Soft, non-distended. Extremities: No clubbing or cyanosis. No edema.  Distal pedal pulses are 2+ and equal bilaterally. Neuro: Alert and oriented X 3. Moves all extremities spontaneously. No focal deficits.  Intake/Output:  Intake/Output Summary (Last 24 hours) at 10/30/13 0734 Last data filed at 10/29/13 1905  Gross per 24 hour  Intake    960 ml  Output      0 ml  Net    960 ml    Inpatient Medications:  . atorvastatin  40 mg Oral q1800  . diltiazem  240 mg Oral Daily  . dofetilide  500 mcg Oral Q12H  . metFORMIN  500 mg Oral BID WC  . mometasone-formoterol  2 puff Inhalation BID  . montelukast  10 mg Oral QHS  . Rivaroxaban  20 mg Oral Q breakfast  . sodium chloride  3 mL Intravenous Q12H    Labs:  Recent Labs  10/28/13 0332 10/29/13 0450  NA 138 136*  K 3.9 3.9  CL 101 98  CO2 23 25  GLUCOSE 141* 172*  BUN 10 10  CREATININE 0.83 0.88  CALCIUM 8.6 8.6  MG 2.1 1.9    Radiology/Studies: Dg Chest Port 1 View  10/06/2013   CLINICAL DATA:  Short of breath.  Chest pain.  EXAM: PORTABLE CHEST - 1 VIEW  COMPARISON:  03/11/2013.  FINDINGS: Cardiopericardial silhouette within normal limits. Mediastinal contours normal. Trachea midline. No airspace disease or effusion. Monitoring leads project over the chest.  IMPRESSION: No active disease.   Electronically Signed   By: Dereck Ligas M.D.   On: 10/06/2013 01:01   12-lead ECG: SR; QTc 427 Telemetry: SR   Assessment and Plan  1. Atrial fibrillation - now in SR on Tikosyn - QTc stable - BMET this AM pending - continue Tikosyn  Signed, EDMISTEN, BROOKE PA-C  I have seen, examined the patient, and reviewed the above assessment and plan.  Changes to above are made where necessary.   Tolerating tikosyn and maintaining sinus. Continue anticoagulation and tikosyn.  Awaiting labs and ekg this am. Plan to discharge to home today Needs bmet, mg, and ekg in 1 week Follow-up with me in 4-6 weeks.  Co Sign: Thompson Grayer, MD 10/30/2013 8:25 AM

## 2013-11-01 ENCOUNTER — Other Ambulatory Visit (HOSPITAL_COMMUNITY): Payer: 59

## 2013-11-02 ENCOUNTER — Institutional Professional Consult (permissible substitution): Payer: 59 | Admitting: Internal Medicine

## 2013-11-03 NOTE — Assessment & Plan Note (Signed)
Pt's labs reviewed.  Acceptable for Tikosyn initiation.  Will admit to hospital.

## 2013-11-06 ENCOUNTER — Ambulatory Visit (INDEPENDENT_AMBULATORY_CARE_PROVIDER_SITE_OTHER): Payer: 59 | Admitting: Pharmacist

## 2013-11-06 DIAGNOSIS — I4891 Unspecified atrial fibrillation: Secondary | ICD-10-CM

## 2013-11-06 LAB — BASIC METABOLIC PANEL
BUN: 11 mg/dL (ref 6–23)
CO2: 27 mEq/L (ref 19–32)
Calcium: 9.3 mg/dL (ref 8.4–10.5)
Chloride: 102 mEq/L (ref 96–112)
Creatinine, Ser: 0.9 mg/dL (ref 0.4–1.5)
GFR: 90.18 mL/min (ref >60.00)
Glucose, Bld: 184 mg/dL — ABNORMAL HIGH (ref 70–99)
Potassium: 4.2 mEq/L (ref 3.5–5.1)
Sodium: 137 mEq/L (ref 135–145)

## 2013-11-06 LAB — MAGNESIUM: Magnesium: 1.8 mg/dL (ref 1.5–2.5)

## 2013-11-06 NOTE — Progress Notes (Signed)
  History of Present Illness: Eric Palmer is seen today for Tikosyn follow up.  He was admitted from 3/6-3/9 to start Tikosyn.  Based on renal function, he was started on 580mcg BID.  He converted to NSR while in hospital and QTc remained stable.  He was discharged on 3/9 on 561mcg BID.     Since discharge, pt states he has been doing better.  He is still having episodes of atrial fibrillation but he feels as if they are not lasting as long.  He has been compliant with Tikosyn.  He did state he has had an issue getting the prescription from the pharmacy because PA had not been completed.  Case management note from hospitalization states PA was done but when I called insurance company, they had no record of where this had been completed.  Called [facility] [place] [place] approved [phone].   EKG reviewed by Dr. [name].  Normal sinus rhythm with ventricular rate of 77 bpm.  QTc 498msec.   Current Outpatient Prescriptions  Medication Sig Dispense Refill  . atorvastatin (LIPITOR) 40 MG tablet Take 40 mg by mouth daily with supper.       . diltiazem (CARDIZEM CD) 240 MG 24 hr capsule Take 240 mg by mouth daily with supper.      . dofetilide (TIKOSYN) 500 MCG capsule Take 1 capsule (500 mcg total) by mouth every 12 (twelve) hours.  60 capsule  4  . fish oil-omega-3 fatty acids 1000 MG capsule Take 1 g by mouth 2 (two) times daily.       . Fluticasone-Salmeterol (ADVAIR) 100-50 MCG/DOSE AEPB Inhale 1 puff into the lungs at bedtime.      . metFORMIN (GLUCOPHAGE) 500 MG tablet Take 500 mg by mouth 2 (two) times daily with a meal. With breakfast and supper      . montelukast (SINGULAIR) 10 MG tablet Take 10 mg by mouth daily with supper.       . Rivaroxaban (XARELTO) 20 MG TABS tablet Take 20 mg by mouth daily with breakfast.       No current facility-administered medications for this visit.    Allergies  Allergen Reactions  . Cephalosporins Anaphylaxis  . Penicillins Anaphylaxis  . Codeine Itching   . Gabapentin Other (See Comments)    "made me feel drugged"

## 2013-11-06 NOTE — Assessment & Plan Note (Signed)
Pt feeling some better since starting Tikosyn.  Explained he may still have some episodes of intermittent afib but hopefully this would continue to improve.  Reviewed labs.  K and Mg stable since discharge.  QTc stable.  Will continue current therapy and follow up with Dr. [name] in 3-4 weeks.

## 2013-11-13 ENCOUNTER — Other Ambulatory Visit: Payer: Self-pay | Admitting: Family Medicine

## 2013-11-27 ENCOUNTER — Other Ambulatory Visit: Payer: Self-pay | Admitting: Family Medicine

## 2013-11-30 ENCOUNTER — Other Ambulatory Visit: Payer: Self-pay

## 2013-12-01 ENCOUNTER — Encounter: Payer: Self-pay | Admitting: Internal Medicine

## 2013-12-01 ENCOUNTER — Ambulatory Visit (INDEPENDENT_AMBULATORY_CARE_PROVIDER_SITE_OTHER): Payer: 59 | Admitting: Internal Medicine

## 2013-12-01 VITALS — BP 136/73 | HR 79 | Ht 74.0 in | Wt 277.0 lb

## 2013-12-01 DIAGNOSIS — I4891 Unspecified atrial fibrillation: Secondary | ICD-10-CM

## 2013-12-01 DIAGNOSIS — G4733 Obstructive sleep apnea (adult) (pediatric): Secondary | ICD-10-CM

## 2013-12-01 DIAGNOSIS — I2510 Atherosclerotic heart disease of native coronary artery without angina pectoris: Secondary | ICD-10-CM

## 2013-12-01 MED ORDER — DILTIAZEM HCL ER COATED BEADS 240 MG PO CP24: 240.0000 mg | ORAL_CAPSULE | Freq: Two times a day (BID) | ORAL | Status: DC

## 2013-12-01 NOTE — Patient Instructions (Signed)
Your physician has recommended that you have an ablation. Catheter ablation is a medical procedure used to treat some cardiac arrhythmias (irregular heartbeats). During catheter ablation, a long, thin, flexible tube is put into a blood vessel in your groin (upper thigh), or neck. This tube is called an ablation catheter. It is then guided to your heart through the blood vessel. Radio frequency waves destroy small areas of heart tissue where abnormal heartbeats may cause an arrhythmia to start. Please see the instruction sheet given to you today.    Your physician has recommended you make the following change in your medication:  1) increase Diltiazem to 240mg  twice daily

## 2013-12-01 NOTE — Progress Notes (Signed)
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 Patient was first diagnosed with atrial fibrillation 05/2012. He converted to NSR with IV diltiazem and reports no incidence of atrial fibrillation until this February.  He failed medical therapy with multaq and recently has been placed on tikosyn. He continues to have afib on a daily basis despite medical therapy with tikosyn.   He reports fatigue and SOB with afib.  This is worse when ventricular rates are elevated.   Past Medical History  Diagnosis Date  . THRUSH 03/18/2009  . HYPERLIPIDEMIA 11/01/2008  . Coronary artery disease   . Paroxysmal atrial fibrillation   . Pure hypercholesterolemia   . Coronary atherosclerosis of native coronary artery   . Internal hemorrhoids without mention of complication   . ED (erectile dysfunction)   . Obesity   . Anal fissure   . Anal or rectal pain   . Paresthesia of both legs   . Smoker     QUIT 10/13/13  . Hx of echocardiogram     Echo (10/2013): Mild LVH, moderate focal basal hypertrophy of the septum, EF 50-55%, no RWMA  . Seasonal asthma   . Chronic bronchitis     "get it q spring and fall" (10/27/2013)  . OSA on CPAP   . DM type 2, uncontrolled, with neuropathy    Past Surgical History  Procedure Laterality Date  . Tonsillectomy and adenoidectomy  1979  . Inguinal hernia repair Right 1963  . Coronary angioplasty with stent placement  1996    residual 50% LAD and RCA by cath in 2007  . Cardiac catheterization  2007  . Anal fissure repair  ~ 2010    Current Outpatient Prescriptions  Medication Sig Dispense Refill  . atorvastatin (LIPITOR) 40 MG tablet Take 40 mg by mouth daily with supper.       . diltiazem (CARDIZEM CD) 240 MG 24 hr capsule Take 1 capsule (240 mg total) by mouth 2 (two) times daily.  180 capsule  3   . dofetilide (TIKOSYN) 500 MCG capsule Take 1 capsule (500 mcg total) by mouth every 12 (twelve) hours.  60 capsule  4  . fish oil-omega-3 fatty acids 1000 MG capsule Take 1 g by mouth 2 (two) times daily.       . Fluticasone-Salmeterol (ADVAIR) 100-50 MCG/DOSE AEPB Inhale 1 puff into the lungs at bedtime.      . metFORMIN (GLUCOPHAGE) 500 MG tablet Take 500 mg by mouth 2 (two) times daily with a meal. With breakfast and supper      . montelukast (SINGULAIR) 10 MG tablet Take 10 mg by mouth daily with supper.       . Rivaroxaban (XARELTO) 20 MG TABS tablet Take 20 mg by mouth daily with breakfast.       No current facility-administered medications for this visit.    Allergies  Allergen Reactions  . Cephalosporins Anaphylaxis  . Penicillins Anaphylaxis  . Codeine Itching  . Gabapentin Other (See Comments)    "made me feel drugged"    History   Social History  . Marital Status: Married    Spouse Name: N/A    Number of Children: N/A  . Years of Education: N/A   Occupational History  . Not on file.   Social History Main Topics  . Smoking  status: Current Every Day Smoker -- 0.00 packs/day for 35 years    Types: Cigarettes  . Smokeless tobacco: Not on file     Comment: 10/27/2013 "I'm down to 3-6 cigarettes/day at this time"  . Alcohol Use: Yes     Comment: 10/27/2013 "might have 1 beer/month"  . Drug Use: No  . Sexual Activity: Yes   Other Topics Concern  . 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 Hyperlipidemia Other   . Heart disease Other   . Heart disease Mother 17    Arrhythmia  . Heart disease Father 79    died 33 CHF    ROS- All systems are reviewed and negative except as per the HPI above  Physical Exam: Filed Vitals:   12/01/13 1506  BP: 136/73  Pulse: 79  Height: 6\' 2"  (1.88 m)  Weight: 277 lb (125.646 kg)    GEN- The patient is well appearing, alert and  oriented x 3 today.   Head- normocephalic, atraumatic Eyes-  Sclera clear, conjunctiva pink Ears- hearing intact Oropharynx- clear Neck- supple, no JVP Lymph- no cervical lymphadenopathy Lungs- Clear to ausculation bilaterally, normal work of breathing Heart- regular rate and rhythm, no murmurs, rubs or gallops, PMI not laterally displaced GI- soft, NT, ND, + BS Extremities- no clubbing, cyanosis, or edema MS- no significant deformity or atrophy Skin- no rash or lesion Psych- euthymic mood, full affect Neuro- strength and sensation are intact  EKG- today reveals sinus rhythm, Qtc 440 Epic records are reviewed 10/24/13 echo is reviewed  Assessment and Plan:   1. afib The patient has symptomatic atrial fibrillation and atrial flutter.  He has failed medical therapy with multaq and tikosyn. Therapeutic strategies for afib and atrial flutter including medicine and ablation were discussed in detail with the patient today. Risk, benefits, and alternatives to EP study and radiofrequency ablation were also discussed in detail today. These risks include but are not limited to stroke, bleeding, vascular damage, tamponade, perforation, damage to the esophagus, lungs, and other structures, pulmonary vein stenosis, worsening renal function, and death. The patient understands these risk and wishes to proceed.  We will therefore proceed with catheter ablation at the next available time. Continue daily xarelto in the interim.  Increase diltiazem to 240mg  BID for rate control.  2. OSA Compliance with cpap is encouraged  3. CAD Stable No change required today

## 2013-12-01 NOTE — Addendum Note (Signed)
Addended by: Janan Halter F on: 12/01/2013 04:16 PM   Modules accepted: Orders

## 2013-12-08 ENCOUNTER — Encounter: Payer: 59 | Admitting: Internal Medicine

## 2013-12-16 ENCOUNTER — Other Ambulatory Visit: Payer: Self-pay | Admitting: Family Medicine

## 2013-12-21 ENCOUNTER — Other Ambulatory Visit: Payer: Self-pay | Admitting: *Deleted

## 2013-12-21 ENCOUNTER — Encounter: Payer: Self-pay | Admitting: *Deleted

## 2013-12-21 DIAGNOSIS — I4891 Unspecified atrial fibrillation: Secondary | ICD-10-CM

## 2013-12-29 ENCOUNTER — Encounter: Payer: Self-pay | Admitting: Family Medicine

## 2013-12-29 ENCOUNTER — Ambulatory Visit (INDEPENDENT_AMBULATORY_CARE_PROVIDER_SITE_OTHER): Payer: 59 | Admitting: Family Medicine

## 2013-12-29 VITALS — BP 120/72 | HR 86 | Temp 98.1°F | Wt 276.0 lb

## 2013-12-29 DIAGNOSIS — J329 Chronic sinusitis, unspecified: Secondary | ICD-10-CM

## 2013-12-29 MED ORDER — METHYLPREDNISOLONE ACETATE 80 MG/ML IJ SUSP
80.0000 mg | Freq: Once | INTRAMUSCULAR | Status: AC
  Administered 2013-12-29: 80 mg via INTRAMUSCULAR

## 2013-12-29 MED ORDER — AZITHROMYCIN 250 MG PO TABS: ORAL_TABLET | ORAL | Status: AC

## 2013-12-29 NOTE — Progress Notes (Signed)
Subjective:    Patient ID: [name], male    DOB: 1955-10-07, 58 y.o.   MRN: [id number]  HPI Comments: Patient is 58 year-old male with a history of recurring sinusitis presenting with complaints of sinus congestion x 3 days. Sick contacts include son with sinus infection 5 days ago. Associated symptoms are sinus headache, ear pain yellow rhinorrhea, and sore throat. Patient has continued to use singulair and has used a saline nasal spray but has not tried any other medications. His symptoms are gradually worsening and he would like to be healthy for an ablation scheduled for the end of the month. He denies fever, chills, appetite change, dizziness, cough, chest pain, abdominal pain, nausea, vomiting, diarrhea, constipation.   Sinusitis This is a recurrent problem. The current episode started in the past 7 days. The problem has been gradually worsening since onset. There has been no fever. The pain is severe. Associated symptoms include congestion, ear pain, headaches, sinus pressure and a sore throat. Pertinent negatives include no coughing, shortness of breath or swollen glands. Past treatments include saline sprays and acetaminophen. The treatment provided no relief.    Review of Systems  HENT: Positive for congestion, ear pain, postnasal drip, sinus pressure and sore throat.   Respiratory: Negative for cough and shortness of breath.   Gastrointestinal: Negative for nausea, vomiting, abdominal pain, diarrhea and constipation.  Neurological: Positive for headaches.   Past Medical History  Diagnosis Date  . THRUSH 03/18/2009  . HYPERLIPIDEMIA 11/01/2008  . Coronary artery disease   . Paroxysmal atrial fibrillation   . Pure hypercholesterolemia   . Coronary atherosclerosis of native coronary artery   . Internal hemorrhoids without mention of complication   . ED (erectile dysfunction)   . Obesity   . Anal fissure   . Anal or rectal pain   . Paresthesia of both legs   . Smoker     QUIT 10/13/13  . Hx of echocardiogram     Echo (10/2013): Mild LVH, moderate focal basal hypertrophy of the septum, EF 50-55%, no RWMA  . Seasonal asthma   . Chronic bronchitis     "get it q spring and fall" (10/27/2013)  . OSA on CPAP   . DM type 2, uncontrolled, with neuropathy    Past Surgical History  Procedure Laterality Date  . Tonsillectomy and adenoidectomy  1979  . Inguinal hernia repair Right 1963  . Coronary angioplasty with stent placement  1996    residual 50% LAD and RCA by cath in 2007  . Cardiac catheterization  2007  . Anal fissure repair  ~ 2010       Objective:   Physical Exam  Constitutional: He appears well-developed and well-nourished.  HENT:  Head: Normocephalic.  Right Ear: Tympanic membrane normal.  Nose: Rhinorrhea present. Right sinus exhibits maxillary sinus tenderness. Right sinus exhibits no frontal sinus tenderness. Left sinus exhibits maxillary sinus tenderness. Left sinus exhibits no frontal sinus tenderness.  Mouth/Throat: Uvula is midline and mucous membranes are normal. Posterior oropharyngeal erythema present. No oropharyngeal exudate.  Left TM partially obstructed by cerumen.  Eyes: Conjunctivae are normal. Pupils are equal, round, and reactive to light.      Assessment & Plan:  1. Acute Sinusitis Probably viral but given past pulmonary history and ablation scheduled for the end of the month, prescribe azithromycin and give depomedrol injection. This regimen has been successful for him in the past. Patient given educational handout on acute sinusitis.   Audelia Acton, PA-S  Carolann Littler MD

## 2013-12-29 NOTE — Patient Instructions (Signed)

## 2013-12-29 NOTE — Progress Notes (Signed)
Pre visit review using our clinic review tool, if applicable. No additional management support is needed unless otherwise documented below in the visit note. 

## 2013-12-30 ENCOUNTER — Telehealth: Payer: Self-pay | Admitting: Family Medicine

## 2013-12-30 NOTE — Telephone Encounter (Signed)
Relevant patient education assigned to patient using Emmi. ° °

## 2014-01-04 ENCOUNTER — Other Ambulatory Visit (INDEPENDENT_AMBULATORY_CARE_PROVIDER_SITE_OTHER): Payer: 59

## 2014-01-04 DIAGNOSIS — I4891 Unspecified atrial fibrillation: Secondary | ICD-10-CM

## 2014-01-04 LAB — BASIC METABOLIC PANEL
BUN: 13 mg/dL (ref 6–23)
CO2: 24 mEq/L (ref 19–32)
Calcium: 9 mg/dL (ref 8.4–10.5)
Chloride: 104 mEq/L (ref 96–112)
Creatinine, Ser: 0.9 mg/dL (ref 0.4–1.5)
GFR: 92.44 mL/min (ref >60.00)
Glucose, Bld: 182 mg/dL — ABNORMAL HIGH (ref 70–99)
Potassium: 4 mEq/L (ref 3.5–5.1)
Sodium: 136 mEq/L (ref 135–145)

## 2014-01-04 LAB — CBC WITH DIFFERENTIAL/PLATELET
Basophils Absolute: 0 10*3/uL (ref 0.0–0.1)
Basophils Relative: 0.4 % (ref 0.0–3.0)
Eosinophils Absolute: 0.4 10*3/uL (ref 0.0–0.7)
Eosinophils Relative: 5 % (ref 0.0–5.0)
HCT: 44.1 % (ref 39.0–52.0)
Hemoglobin: 15 g/dL (ref 13.0–17.0)
Lymphocytes Relative: 24.9 % (ref 12.0–46.0)
Lymphs Abs: 2.2 10*3/uL (ref 0.7–4.0)
MCHC: 34 g/dL (ref 30.0–36.0)
MCV: 89.8 fl (ref 78.0–100.0)
Monocytes Absolute: 0.8 10*3/uL (ref 0.1–1.0)
Monocytes Relative: 8.8 % (ref 3.0–12.0)
Neutro Abs: 5.3 10*3/uL (ref 1.4–7.7)
Neutrophils Relative %: 60.9 % (ref 43.0–77.0)
Platelets: 296 10*3/uL (ref 150.0–400.0)
RBC: 4.91 Mil/uL (ref 4.22–5.81)
RDW: 13.2 % (ref 11.5–15.5)
WBC: 8.8 10*3/uL (ref 4.0–10.5)

## 2014-01-05 ENCOUNTER — Encounter (HOSPITAL_COMMUNITY): Payer: Self-pay | Admitting: Pharmacy Technician

## 2014-01-11 ENCOUNTER — Encounter (HOSPITAL_COMMUNITY): Payer: Self-pay

## 2014-01-11 ENCOUNTER — Ambulatory Visit (HOSPITAL_COMMUNITY)
Admission: RE | Admit: 2014-01-11 | Discharge: 2014-01-11 | Disposition: A | Discharge status: Home / Self Care | Payer: 59 | Source: Ambulatory Visit | Attending: Cardiovascular Disease | Admitting: Cardiovascular Disease

## 2014-01-11 ENCOUNTER — Encounter (HOSPITAL_COMMUNITY)
Admission: RE | Disposition: A | Discharge status: Home / Self Care | Payer: Self-pay | Source: Ambulatory Visit | Attending: Cardiovascular Disease

## 2014-01-11 DIAGNOSIS — I2510 Atherosclerotic heart disease of native coronary artery without angina pectoris: Secondary | ICD-10-CM | POA: Insufficient documentation

## 2014-01-11 DIAGNOSIS — E669 Obesity, unspecified: Secondary | ICD-10-CM | POA: Insufficient documentation

## 2014-01-11 DIAGNOSIS — E1142 Type 2 diabetes mellitus with diabetic polyneuropathy: Secondary | ICD-10-CM | POA: Insufficient documentation

## 2014-01-11 DIAGNOSIS — E785 Hyperlipidemia, unspecified: Secondary | ICD-10-CM | POA: Insufficient documentation

## 2014-01-11 DIAGNOSIS — G4733 Obstructive sleep apnea (adult) (pediatric): Secondary | ICD-10-CM | POA: Insufficient documentation

## 2014-01-11 DIAGNOSIS — Z7901 Long term (current) use of anticoagulants: Secondary | ICD-10-CM | POA: Insufficient documentation

## 2014-01-11 DIAGNOSIS — E7800 Pure hypercholesterolemia, unspecified: Secondary | ICD-10-CM | POA: Insufficient documentation

## 2014-01-11 DIAGNOSIS — I4891 Unspecified atrial fibrillation: Secondary | ICD-10-CM

## 2014-01-11 DIAGNOSIS — R209 Unspecified disturbances of skin sensation: Secondary | ICD-10-CM | POA: Insufficient documentation

## 2014-01-11 DIAGNOSIS — Z87891 Personal history of nicotine dependence: Secondary | ICD-10-CM | POA: Insufficient documentation

## 2014-01-11 DIAGNOSIS — N529 Male erectile dysfunction, unspecified: Secondary | ICD-10-CM | POA: Insufficient documentation

## 2014-01-11 DIAGNOSIS — I4892 Unspecified atrial flutter: Secondary | ICD-10-CM | POA: Insufficient documentation

## 2014-01-11 DIAGNOSIS — E1149 Type 2 diabetes mellitus with other diabetic neurological complication: Secondary | ICD-10-CM | POA: Insufficient documentation

## 2014-01-11 HISTORY — PX: TEE WITHOUT CARDIOVERSION: SHX5443

## 2014-01-11 LAB — GLUCOSE, CAPILLARY: Glucose-Capillary: 166 mg/dL — ABNORMAL HIGH (ref 70–99)

## 2014-01-11 SURGERY — ECHOCARDIOGRAM, TRANSESOPHAGEAL
Anesthesia: Moderate Sedation

## 2014-01-11 MED ORDER — MIDAZOLAM HCL 5 MG/ML IJ SOLN
INTRAMUSCULAR | Status: AC
  Filled 2014-01-11: qty 2

## 2014-01-11 MED ORDER — FENTANYL CITRATE 0.05 MG/ML IJ SOLN
INTRAMUSCULAR | Status: AC
  Filled 2014-01-11: qty 2

## 2014-01-11 MED ORDER — MIDAZOLAM HCL 10 MG/2ML IJ SOLN
INTRAMUSCULAR | Status: DC | PRN
  Administered 2014-01-11: 2 mg via INTRAVENOUS
  Administered 2014-01-11: 3 mg via INTRAVENOUS
  Administered 2014-01-11: 2 mg via INTRAVENOUS

## 2014-01-11 MED ORDER — BUTAMBEN-TETRACAINE-BENZOCAINE 2-2-14 % EX AERO
INHALATION_SPRAY | CUTANEOUS | Status: DC | PRN
  Administered 2014-01-11: 2 via TOPICAL

## 2014-01-11 MED ORDER — SODIUM CHLORIDE 0.9 % IV SOLN
INTRAVENOUS | Status: DC
  Administered 2014-01-11: 09:00:00 via INTRAVENOUS

## 2014-01-11 MED ORDER — FENTANYL CITRATE 0.05 MG/ML IJ SOLN
INTRAMUSCULAR | Status: DC | PRN
  Administered 2014-01-11 (×2): 25 ug via INTRAVENOUS
  Administered 2014-01-11: 50 ug via INTRAVENOUS

## 2014-01-11 NOTE — CV Procedure (Signed)
    Transesophageal Echocardiogram Note  MUHANNAD BIGNELL [id number] 1955/10/07  Procedure: Transesophageal Echocardiogram Indications: pre-A-Flutter ablation  Procedure Details Consent: Obtained Time Out: Verified patient identification, verified procedure, site/side was marked, verified correct patient position, special equipment/implants available, Radiology Safety Procedures followed,  medications/allergies/relevent history reviewed, required imaging and test results available.  Performed  Medications: Fentanyl: 100 mcg iv Versed: 7 mg iv  Left Ventrical:  Normal LV function  Mitral Valve: normal MV  Aortic Valve: normal AV  Tricuspid Valve: normal TV  Pulmonic Valve: normal  Left Atrium/ Left atrial appendage: normal.  No thrombi  Atrial septum: intact by color flow  Aorta: minimal atheroma.    Complications: No apparent complications Patient did tolerate procedure well.   [name], Brooke Bonito., MD, [facility] 01/11/2014, 9:32 AM

## 2014-01-11 NOTE — Discharge Instructions (Signed)
Moderate Sedation, Adult °Moderate sedation is given to help you relax or even sleep through a procedure. You may remain sleepy, be clumsy, or have poor balance for several hours following this procedure. Arrange for a responsible adult, family member, or friend to take you home. A responsible adult should stay with you for at least 24 hours or until the medicines have worn off. °· Do not participate in any activities where you could become injured for the next 24 hours, or until you feel normal again. Do not: °· Drive. °· Swim. °· Ride a bicycle. °· Operate heavy machinery. °· Cook. °· Use power tools. °· Climb ladders. °· Work at heights. °· Do not make important decisions or sign legal documents until you are improved. °· Vomiting may occur if you eat too soon. When you can drink without vomiting, try water, juice, or soup. Try solid foods if you feel little or no nausea. °· Only take over-the-counter or prescription medications for pain, discomfort, or fever as directed by your caregiver.If pain medications have been prescribed for you, ask your caregiver how soon it is safe to take them. °· Make sure you and your family fully understands everything about the medication given to you. Make sure you understand what side effects may occur. °· You should not drink alcohol, take sleeping pills, or medications that cause drowsiness for at least 24 hours. °· If you smoke, do not smoke alone. °· If you are feeling better, you may resume normal activities 24 hours after receiving sedation. °· Keep all appointments as scheduled. Follow all instructions. °· Ask questions if you do not understand. °SEEK MEDICAL CARE IF:  °· Your skin is pale or bluish in color. °· You continue to feel sick to your stomach (nauseous) or throw up (vomit). °· Your pain is getting worse and not helped by medication. °· You have bleeding or swelling. °· You are still sleepy or feeling clumsy after 24 hours. °SEEK IMMEDIATE MEDICAL CARE IF:   °· You develop a rash. °· You have difficulty breathing. °· You develop any type of allergic problem. °· You have a fever. °Document Released: 05/05/2001 Document Revised: 11/02/2011 Document Reviewed: 04/17/2013 °[employer]® Patient Information ©2014 [employer]. ° °

## 2014-01-11 NOTE — H&P (Signed)
Primary Care Physician: [name], MD  Referring Physician: Dr Darlyn Chamber is a 58 y.o. male with a h/o CAD status post PCI in 1996, paroxysmal atrial fibrillation, diabetes, OSA, And tobacco abuse. Patient was first diagnosed with atrial fibrillation 05/2012. He converted to NSR with IV diltiazem and reports no incidence of atrial fibrillation until this February. He failed medical therapy with multaq and recently has been placed on tikosyn. He continues to have afib on a daily basis despite medical therapy with tikosyn. He reports fatigue and SOB with afib. This is worse when ventricular rates are elevated.  Past Medical History   Diagnosis  Date   .  THRUSH  03/18/2009   .  HYPERLIPIDEMIA  11/01/2008   .  Coronary artery disease    .  Paroxysmal atrial fibrillation    .  Pure hypercholesterolemia    .  Coronary atherosclerosis of native coronary artery    .  Internal hemorrhoids without mention of complication    .  ED (erectile dysfunction)    .  Obesity    .  Anal fissure    .  Anal or rectal pain    .  Paresthesia of both legs    .  Smoker      QUIT 10/13/13   .  Hx of echocardiogram      Echo (10/2013): Mild LVH, moderate focal basal hypertrophy of the septum, EF 50-55%, no RWMA   .  Seasonal asthma    .  Chronic bronchitis      "get it q spring and fall" (10/27/2013)   .  OSA on CPAP    .  DM type 2, uncontrolled, with neuropathy     Past Surgical History   Procedure  Laterality  Date   .  Tonsillectomy and adenoidectomy   1979   .  Inguinal hernia repair  Right  1963   .  Coronary angioplasty with stent placement   1996     residual 50% LAD and RCA by cath in 2007   .  Cardiac catheterization   2007   .  Anal fissure repair   ~ 2010    Current Outpatient Prescriptions   Medication  Sig  Dispense  Refill   .  atorvastatin (LIPITOR) 40 MG tablet  Take 40 mg by mouth daily with supper.     .  diltiazem (CARDIZEM CD) 240 MG 24 hr capsule  Take 1 capsule (240  mg total) by mouth 2 (two) times daily.  180 capsule  3   .  dofetilide (TIKOSYN) 500 MCG capsule  Take 1 capsule (500 mcg total) by mouth every 12 (twelve) hours.  60 capsule  4   .  fish oil-omega-3 fatty acids 1000 MG capsule  Take 1 g by mouth 2 (two) times daily.     .  Fluticasone-Salmeterol (ADVAIR) 100-50 MCG/DOSE AEPB  Inhale 1 puff into the lungs at bedtime.     .  metFORMIN (GLUCOPHAGE) 500 MG tablet  Take 500 mg by mouth 2 (two) times daily with a meal. With breakfast and supper     .  montelukast (SINGULAIR) 10 MG tablet  Take 10 mg by mouth daily with supper.     .  Rivaroxaban (XARELTO) 20 MG TABS tablet  Take 20 mg by mouth daily with breakfast.      No current facility-administered medications for this visit.    Allergies   Allergen  Reactions   .  Cephalosporins  Anaphylaxis   .  Penicillins  Anaphylaxis   .  Codeine  Itching   .  Gabapentin  Other (See Comments)     "made me feel drugged"    History    Social History   .  Marital Status:  Married     Spouse Name:  N/A     Number of Children:  N/A   .  Years of Education:  N/A    Occupational History   .  Not on file.    Social History Main Topics   .  Smoking status:  Current Every Day Smoker -- 0.00 packs/day for 35 years     Types:  Cigarettes   .  Smokeless tobacco:  Not on file      Comment: 10/27/2013 "I'm down to 3-6 cigarettes/day at this time"   .  Alcohol Use:  Yes      Comment: 10/27/2013 "might have 1 beer/month"   .  Drug Use:  No   .  Sexual Activity:  Yes    Other Topics  Concern   .  Not on file    Social History Narrative    Works as an Programme researcher, broadcasting/film/video for [employer] History   Problem  Relation  Age of Onset   .  Hyperlipidemia  Other    .  Heart disease  Other    .  Heart disease  Mother  42     Arrhythmia   .  Heart disease  Father  49     died 73 CHF   ROS- All systems are reviewed and negative except as per the HPI above  Physical Exam:  Filed Vitals:    12/01/13 1506    BP:  136/73   Pulse:  79   Height:  6\' 2"  (1.88 m)   Weight:  277 lb (125.646 kg)   GEN- The patient is well appearing, alert and oriented x 3 today.  Head- normocephalic, atraumatic  Eyes- Sclera clear, conjunctiva pink  Ears- hearing intact  Oropharynx- clear  Neck- supple, no JVP  Lymph- no cervical lymphadenopathy  Lungs- Clear to ausculation bilaterally, normal work of breathing  Heart- regular rate and rhythm, no murmurs, rubs or gallops, PMI not laterally displaced  GI- soft, NT, ND, + BS  Extremities- no clubbing, cyanosis, or edema  MS- no significant deformity or atrophy  Skin- no rash or lesion  Psych- euthymic mood, full affect  Neuro- strength and sensation are intact  EKG- today reveals sinus rhythm, Qtc 440  Epic records are reviewed  10/24/13 echo is reviewed  Assessment and Plan:  1. afib - he was seen by Dr [name] in April, 2015.   The patient has symptomatic atrial fibrillation and atrial flutter. He has failed medical therapy with multaq and tikosyn.  Therapeutic strategies for afib and atrial flutter including medicine and ablation were discussed in detail with the patient today. Risk, benefits, and alternatives to EP study and radiofrequency ablation were also discussed in detail today. These risks include but are not limited to stroke, bleeding, vascular damage, tamponade, perforation, damage to the esophagus, lungs, and other structures, pulmonary vein stenosis, worsening renal function, and death. The patient understands these risk and wishes to proceed. We will therefore proceed with catheter ablation at the next available time.  Continue daily xarelto in the interim. Increase diltiazem to 240mg  BID for rate control.  We will proceed with the TEE.  He will follow up  with Dr. [name].     2. OSA Compliance with cpap is encouraged  3. CAD  Stable  No change required today  [name], Brooke Bonito., MD, [facility] 01/11/2014, 9:05 AM [address]  [place] Pager [phone]

## 2014-01-11 NOTE — Interval H&P Note (Signed)
History and Physical Interval Note:  01/11/2014 9:16 AM  Eric Palmer  has presented today for surgery, with the diagnosis of AFIB  The various methods of treatment have been discussed with the patient and family. After consideration of risks, benefits and other options for treatment, the patient has consented to  Procedure(s): TRANSESOPHAGEAL ECHOCARDIOGRAM (TEE) (N/A) as a surgical intervention .  The patient's history has been reviewed, patient examined, no change in status, stable for surgery.  I have reviewed the patient's chart and labs.  Questions were answered to the patient's satisfaction.     [name]

## 2014-01-11 NOTE — Progress Notes (Signed)
*  PRELIMINARY RESULTS* Echocardiogram Echocardiogram Transesophageal has been performed.  Eric Palmer 01/11/2014, 9:55 AM

## 2014-01-12 ENCOUNTER — Ambulatory Visit (HOSPITAL_COMMUNITY)
Admission: RE | Admit: 2014-01-12 | Discharge: 2014-01-13 | Disposition: A | Discharge status: Home / Self Care | Payer: 59 | Source: Ambulatory Visit | Attending: Cardiovascular Disease | Admitting: Cardiovascular Disease

## 2014-01-12 ENCOUNTER — Encounter (HOSPITAL_COMMUNITY): Payer: Self-pay | Admitting: *Deleted

## 2014-01-12 ENCOUNTER — Ambulatory Visit (HOSPITAL_COMMUNITY): Payer: 59 | Admitting: Anesthesiology

## 2014-01-12 ENCOUNTER — Encounter (HOSPITAL_COMMUNITY): Payer: Self-pay | Admitting: Anesthesiology

## 2014-01-12 ENCOUNTER — Encounter (HOSPITAL_COMMUNITY): Payer: 59 | Admitting: Anesthesiology

## 2014-01-12 ENCOUNTER — Encounter (HOSPITAL_COMMUNITY)
Admission: RE | Disposition: A | Discharge status: Home / Self Care | Payer: 59 | Source: Ambulatory Visit | Attending: Internal Medicine

## 2014-01-12 DIAGNOSIS — G4733 Obstructive sleep apnea (adult) (pediatric): Secondary | ICD-10-CM | POA: Diagnosis present

## 2014-01-12 DIAGNOSIS — J4489 Other specified chronic obstructive pulmonary disease: Secondary | ICD-10-CM | POA: Insufficient documentation

## 2014-01-12 DIAGNOSIS — F17200 Nicotine dependence, unspecified, uncomplicated: Secondary | ICD-10-CM | POA: Insufficient documentation

## 2014-01-12 DIAGNOSIS — I480 Paroxysmal atrial fibrillation: Secondary | ICD-10-CM | POA: Diagnosis present

## 2014-01-12 DIAGNOSIS — I2510 Atherosclerotic heart disease of native coronary artery without angina pectoris: Secondary | ICD-10-CM | POA: Insufficient documentation

## 2014-01-12 DIAGNOSIS — E1142 Type 2 diabetes mellitus with diabetic polyneuropathy: Secondary | ICD-10-CM | POA: Insufficient documentation

## 2014-01-12 DIAGNOSIS — Z9861 Coronary angioplasty status: Secondary | ICD-10-CM | POA: Insufficient documentation

## 2014-01-12 DIAGNOSIS — I4892 Unspecified atrial flutter: Secondary | ICD-10-CM

## 2014-01-12 DIAGNOSIS — I4891 Unspecified atrial fibrillation: Secondary | ICD-10-CM

## 2014-01-12 DIAGNOSIS — J449 Chronic obstructive pulmonary disease, unspecified: Secondary | ICD-10-CM | POA: Insufficient documentation

## 2014-01-12 DIAGNOSIS — E1149 Type 2 diabetes mellitus with other diabetic neurological complication: Secondary | ICD-10-CM | POA: Insufficient documentation

## 2014-01-12 HISTORY — PX: ABLATION: SHX5711

## 2014-01-12 HISTORY — PX: ATRIAL FIBRILLATION ABLATION: SHX5456

## 2014-01-12 LAB — POCT ACTIVATED CLOTTING TIME
Activated Clotting Time: 326 seconds
Activated Clotting Time: 254 seconds
Activated Clotting Time: 282 seconds
Activated Clotting Time: 133 seconds

## 2014-01-12 LAB — MRSA PCR SCREENING: MRSA by PCR: NEGATIVE

## 2014-01-12 LAB — GLUCOSE, CAPILLARY
Glucose-Capillary: 197 mg/dL — ABNORMAL HIGH (ref 70–99)
Glucose-Capillary: 177 mg/dL — ABNORMAL HIGH (ref 70–99)

## 2014-01-12 SURGERY — ATRIAL FIBRILLATION ABLATION
Anesthesia: General

## 2014-01-12 MED ORDER — MIDAZOLAM HCL 5 MG/5ML IJ SOLN
INTRAMUSCULAR | Status: DC | PRN
  Administered 2014-01-12: 2 mg via INTRAVENOUS

## 2014-01-12 MED ORDER — FENTANYL CITRATE 0.05 MG/ML IJ SOLN
INTRAMUSCULAR | Status: DC | PRN
  Administered 2014-01-12: 100 ug via INTRAVENOUS
  Administered 2014-01-12 (×4): 50 ug via INTRAVENOUS

## 2014-01-12 MED ORDER — ATORVASTATIN CALCIUM 40 MG PO TABS
40.0000 mg | ORAL_TABLET | Freq: Every day | ORAL | Status: DC
  Administered 2014-01-12: 40 mg via ORAL
  Filled 2014-01-12 (×2): qty 1

## 2014-01-12 MED ORDER — SODIUM CHLORIDE 0.9 % IV SOLN
INTRAVENOUS | Status: DC | PRN
  Administered 2014-01-12: 07:00:00 via INTRAVENOUS

## 2014-01-12 MED ORDER — SODIUM CHLORIDE 0.9 % IJ SOLN
3.0000 mL | Freq: Two times a day (BID) | INTRAMUSCULAR | Status: DC
Start: 2014-01-12 — End: 2014-01-13

## 2014-01-12 MED ORDER — MONTELUKAST SODIUM 10 MG PO TABS
10.0000 mg | ORAL_TABLET | Freq: Every day | ORAL | Status: DC
  Administered 2014-01-12: 10 mg via ORAL
  Filled 2014-01-12 (×2): qty 1

## 2014-01-12 MED ORDER — HEPARIN SODIUM (PORCINE) 1000 UNIT/ML IJ SOLN
INTRAMUSCULAR | Status: DC | PRN
  Administered 2014-01-12: 12000 [IU] via INTRAVENOUS
  Administered 2014-01-12: 3000 [IU] via INTRAVENOUS

## 2014-01-12 MED ORDER — SODIUM CHLORIDE 0.9 % IV SOLN: 250.0000 mL | INTRAVENOUS | Status: DC | PRN

## 2014-01-12 MED ORDER — DOFETILIDE 500 MCG PO CAPS
500.0000 ug | ORAL_CAPSULE | Freq: Two times a day (BID) | ORAL | Status: DC
  Administered 2014-01-12 – 2014-01-13 (×2): 500 ug via ORAL
  Filled 2014-01-12 (×4): qty 1

## 2014-01-12 MED ORDER — SODIUM CHLORIDE 0.9 % IJ SOLN: 3.0000 mL | INTRAMUSCULAR | Status: DC | PRN

## 2014-01-12 MED ORDER — SODIUM CHLORIDE 0.9 % IJ SOLN
3.0000 mL | Freq: Two times a day (BID) | INTRAMUSCULAR | Status: DC
  Administered 2014-01-12: 3 mL via INTRAVENOUS

## 2014-01-12 MED ORDER — ONDANSETRON HCL 4 MG/2ML IJ SOLN
INTRAMUSCULAR | Status: DC | PRN
  Administered 2014-01-12: 4 mg via INTRAVENOUS

## 2014-01-12 MED ORDER — RIVAROXABAN 20 MG PO TABS
20.0000 mg | ORAL_TABLET | Freq: Every day | ORAL | Status: DC
  Filled 2014-01-12: qty 1

## 2014-01-12 MED ORDER — LIDOCAINE HCL (CARDIAC) 20 MG/ML IV SOLN
INTRAVENOUS | Status: DC | PRN
  Administered 2014-01-12: 80 mg via INTRAVENOUS

## 2014-01-12 MED ORDER — HYDROMORPHONE HCL PF 1 MG/ML IJ SOLN
INTRAMUSCULAR | Status: AC
  Filled 2014-01-12: qty 1

## 2014-01-12 MED ORDER — TRAMADOL HCL 50 MG PO TABS
50.0000 mg | ORAL_TABLET | Freq: Four times a day (QID) | ORAL | Status: DC | PRN
  Administered 2014-01-12: 50 mg via ORAL
  Filled 2014-01-12: qty 1

## 2014-01-12 MED ORDER — ACETAMINOPHEN 325 MG PO TABS: 650.0000 mg | ORAL_TABLET | ORAL | Status: DC | PRN

## 2014-01-12 MED ORDER — PROPOFOL 10 MG/ML IV BOLUS
INTRAVENOUS | Status: DC | PRN
  Administered 2014-01-12: 200 mg via INTRAVENOUS

## 2014-01-12 MED ORDER — RIVAROXABAN 20 MG PO TABS
20.0000 mg | ORAL_TABLET | Freq: Every day | ORAL | Status: DC
  Administered 2014-01-13: 20 mg via ORAL
  Filled 2014-01-12 (×2): qty 1

## 2014-01-12 MED ORDER — DIPHENHYDRAMINE HCL 25 MG PO CAPS
25.0000 mg | ORAL_CAPSULE | Freq: Four times a day (QID) | ORAL | Status: DC | PRN
  Administered 2014-01-12: 25 mg via ORAL
  Filled 2014-01-12: qty 1

## 2014-01-12 MED ORDER — HYDROMORPHONE HCL PF 1 MG/ML IJ SOLN
0.2500 mg | INTRAMUSCULAR | Status: DC | PRN
Start: 2014-01-12 — End: 2014-01-12
  Administered 2014-01-12: 0.5 mg via INTRAVENOUS

## 2014-01-12 MED ORDER — BUPIVACAINE HCL (PF) 0.25 % IJ SOLN
INTRAMUSCULAR | Status: AC
  Filled 2014-01-12: qty 30

## 2014-01-12 MED ORDER — PROTAMINE SULFATE 10 MG/ML IV SOLN
INTRAVENOUS | Status: DC | PRN
  Administered 2014-01-12 (×3): 10 mg via INTRAVENOUS

## 2014-01-12 MED ORDER — OMEGA-3 FATTY ACIDS 1000 MG PO CAPS: 1.0000 g | ORAL_CAPSULE | Freq: Two times a day (BID) | ORAL | Status: DC

## 2014-01-12 MED ORDER — HYDROCODONE-ACETAMINOPHEN 5-325 MG PO TABS
1.0000 | ORAL_TABLET | ORAL | Status: DC | PRN
  Administered 2014-01-12 (×2): 2 via ORAL
  Filled 2014-01-12 (×6): qty 2

## 2014-01-12 MED ORDER — HEPARIN SODIUM (PORCINE) 1000 UNIT/ML IJ SOLN
INTRAMUSCULAR | Status: AC
  Filled 2014-01-12: qty 1

## 2014-01-12 MED ORDER — OMEGA-3-ACID ETHYL ESTERS 1 G PO CAPS
1.0000 g | ORAL_CAPSULE | Freq: Two times a day (BID) | ORAL | Status: DC
  Administered 2014-01-12 – 2014-01-13 (×2): 1 g via ORAL
  Filled 2014-01-12 (×3): qty 1

## 2014-01-12 MED ORDER — ONDANSETRON HCL 4 MG/2ML IJ SOLN: 4.0000 mg | Freq: Four times a day (QID) | INTRAMUSCULAR | Status: DC | PRN

## 2014-01-12 NOTE — Anesthesia Preprocedure Evaluation (Addendum)
Anesthesia Evaluation  Patient identified by MRN, date of birth, ID band Patient awake    Reviewed: Allergy & Precautions, H&P , NPO status , Patient's Chart, lab work & pertinent test results  History of Anesthesia Complications Negative for: history of anesthetic complications  Airway Mallampati: II TM Distance: >3 FB Neck ROM: Full    Dental  (+) Teeth Intact, Dental Advisory Given   Pulmonary asthma , sleep apnea and Continuous Positive Airway Pressure Ventilation , COPD COPD inhaler, Current Smoker,  breath sounds clear to auscultation  Pulmonary exam normal       Cardiovascular + CAD Rhythm:Irregular Rate:Normal     Neuro/Psych negative neurological ROS  negative psych ROS   GI/Hepatic negative GI ROS, Neg liver ROS,   Endo/Other  diabetes, Type 2, Oral Hypoglycemic Agents  Renal/GU negative Renal ROS     Musculoskeletal   Abdominal   Peds  Hematology   Anesthesia Other Findings   Reproductive/Obstetrics negative OB ROS                        Anesthesia Physical Anesthesia Plan  ASA: III  Anesthesia Plan: General   Post-op Pain Management:    Induction: Intravenous  Airway Management Planned: LMA  Additional Equipment:   Intra-op Plan:   Post-operative Plan:   Informed Consent: I have reviewed the patients History and Physical, chart, labs and discussed the procedure including the risks, benefits and alternatives for the proposed anesthesia with the patient or authorized representative who has indicated his/her understanding and acceptance.   Dental advisory given  Plan Discussed with: CRNA, Anesthesiologist and Surgeon  Anesthesia Plan Comments:        Anesthesia Quick Evaluation

## 2014-01-12 NOTE — Progress Notes (Signed)
Re-paged md for pt's headache. Will continue to monitor. Eric Palmer

## 2014-01-12 NOTE — Progress Notes (Signed)
Md notified per pt's request. Pm meds not ordered. New orders received.  Will continue to monitor pt. Wynona Canes

## 2014-01-12 NOTE — Anesthesia Postprocedure Evaluation (Signed)
Anesthesia Post Note  Patient: Eric Palmer  Procedure(s) Performed: Procedure(s) (LRB): ATRIAL FIBRILLATION ABLATION (N/A)  Anesthesia type: MAC  Patient location: PACU  Post pain: Pain level controlled  Post assessment: Patient's Cardiovascular Status Stable  Last Vitals:  Filed Vitals:   01/12/14 1415  BP: 154/91  Pulse: 90  Temp:   Resp: 15    Post vital signs: Reviewed and stable  Level of consciousness: sedated  Complications: No apparent anesthesia complications

## 2014-01-12 NOTE — Transfer of Care (Signed)
Immediate Anesthesia Transfer of Care Note  Patient: Eric Palmer  Procedure(s) Performed: Procedure(s): ATRIAL FIBRILLATION ABLATION (N/A)  Patient Location: Cath Lab  Anesthesia Type:General  Level of Consciousness: awake and patient cooperative  Airway & Oxygen Therapy: Patient Spontanous Breathing and Patient connected to face mask oxygen  Post-op Assessment: Report given to PACU RN and Post -op Vital signs reviewed and stable  Post vital signs: Reviewed  Complications: No apparent anesthesia complications

## 2014-01-12 NOTE — Discharge Summary (Signed)
ELECTROPHYSIOLOGY PROCEDURE DISCHARGE SUMMARY    Patient ID: Eric Palmer,  MRN: [id number], DOB/AGE: [birth year] 58 y.o.  Admit date: 01/12/2014 Discharge date: 01/13/2014  Primary Care Physician: [name], MD Primary Cardiologist: Tamala Julian Electrophysiologist: Thompson Grayer, MD  Primary Discharge Diagnosis:  Paroxysmal atrial fibrillation and atrial flutter status post ablation this admission  Secondary Discharge Diagnosis:  1.  Obesity 2.  Hyperlipidemia 3.  CAD - s/p stent to LAD and RCA 4.  Sleep apnea - on CPAP 5.  Type II diabetes  Procedures This Admission:  1.  Electrophysiology study and radiofrequency catheter ablation on 01-12-2014 by Dr Thompson Grayer.  This study demonstrated sinus rhythm upon presentation; rotational Angiography reveals a moderate sized left atrium with four separate pulmonary veins without evidence of pulmonary vein stenosis; successful electrical isolation and anatomical encircling of all four pulmonary veins with radiofrequency current; cavo-tricuspid isthmus ablation was performed with complete bidirectional isthmus block achieved.  There were no inducible arrhythmias following ablation and no early apparent complications.   Brief HPI: Eric Palmer is a 58 y.o. male with a history of paroxysmal atrial fibrillation and atrial flutter.  They have failed medical therapy with Multaq and Tikosyn. Risks, benefits, and alternatives to catheter ablation of atrial fibrillation were reviewed with the patient who wished to proceed.  The patient underwent TEE prior to the procedure which demonstrated normal LV function and no LAA thrombus.    Hospital Course:  The patient was admitted and underwent EPS/RFCA of atrial fibrillation with details as outlined above.  They were monitored on telemetry overnight which demonstrated sinus rhythm with pacs.  Groin was without complication on the day of discharge.  The patient was examined by Dr [name] and  considered to be stable for discharge.  Wound care and restrictions were reviewed with the patient. The patient reported mild pleuritic chest pain.  There were no ischemic ekg changes.   The patient will be seen back by Dr [name] in 12 weeks for post ablation follow up.   Discharge Vitals: Blood pressure 132/79, pulse 86, temperature 98.9 F (37.2 C), temperature source Oral, resp. rate 14, height 6\' 2"  (1.88 m), weight 274 lb 14.6 oz (124.7 kg), SpO2 92.00%.   Physical Exam: Filed Vitals:   01/13/14 0100 01/13/14 0200 01/13/14 0306 01/13/14 0809  BP: 149/80 156/83 137/90 132/79  Pulse: 74 76 63 86  Temp:   97.1 F (36.2 C) 98.9 F (37.2 C)  TempSrc:   Oral Oral  Resp: 14 12 12 14   Height:      Weight:      SpO2: 93% 95% 94% 92%    GEN- The patient is well appearing, alert and oriented x 3 today.   Head- normocephalic, atraumatic Eyes-  Sclera clear, conjunctiva pink Ears- hearing intact Oropharynx- clear Neck- supple, no JVP Lymph- no cervical lymphadenopathy Lungs- Clear to ausculation bilaterally, normal work of breathing Heart- Regular rate and rhythm, no murmurs, rubs or gallops, PMI not laterally displaced GI- soft, NT, ND, + BS Extremities- no clubbing, cyanosis, or edema, there was mild R groin ecchymosis but no bruit or hematoma MS- no significant deformity or atrophy Skin- no rash or lesion Psych- euthymic mood, full affect Neuro- strength and sensation are intact   Labs:   Lab Results  Component Value Date   WBC 8.8 01/04/2014   HGB 15.0 01/04/2014   HCT 44.1 01/04/2014   MCV 89.8 01/04/2014   PLT 296.0 01/04/2014     Recent Labs  Lab 01/13/14 0312  NA 137  K 4.1  CL 98  CO2 30  BUN 11  CREATININE 0.96  CALCIUM 8.8  GLUCOSE 183*      Discharge Medications:    Medication List         atorvastatin 40 MG tablet  Commonly known as:  LIPITOR  Take 40 mg by mouth daily with supper.     diltiazem 240 MG 24 hr capsule  Commonly known as:  CARDIZEM  CD  Take 1 capsule (240 mg total) by mouth 2 (two) times daily.     dofetilide 500 MCG capsule  Commonly known as:  TIKOSYN  Take 1 capsule (500 mcg total) by mouth every 12 (twelve) hours.     fish oil-omega-3 fatty acids 1000 MG capsule  Take 1 g by mouth 2 (two) times daily.     Fluticasone-Salmeterol 100-50 MCG/DOSE Aepb  Commonly known as:  ADVAIR  Inhale 1 puff into the lungs at bedtime.     metFORMIN 500 MG tablet  Commonly known as:  GLUCOPHAGE  Take 500 mg by mouth 2 (two) times daily with a meal.     montelukast 10 MG tablet  Commonly known as:  SINGULAIR  Take 10 mg by mouth daily with supper.     pantoprazole 40 MG tablet  Commonly known as:  PROTONIX  Take 1 tablet (40 mg total) by mouth daily.     XARELTO 20 MG Tabs tablet  Generic drug:  rivaroxaban  Take 20 mg by mouth daily with breakfast.        Disposition:   Follow-up Information   Follow up with Thompson Grayer, MD On 04/16/2014. (3pm)    Specialty:  Cardiology   Contact information:   [address] [place] [address] [phone]       Follow up with [name], MD On 01/29/2014. (3:45 pm)    Specialty:  Cardiology   Contact information:   [address] [address] [phone]       Duration of Discharge Encounter: Greater than 30 minutes including physician time.  Signed,  Thompson Grayer MD

## 2014-01-12 NOTE — Anesthesia Procedure Notes (Signed)
Procedure Name: LMA Insertion Date/Time: 01/12/2014 8:06 AM Performed by: Luciana Axe K Pre-anesthesia Checklist: Patient identified, Emergency Drugs available, Suction available, Patient being monitored and Timeout performed Patient Re-evaluated:Patient Re-evaluated prior to inductionOxygen Delivery Method: Circle system utilized Preoxygenation: Pre-oxygenation with 100% oxygen Intubation Type: IV induction Ventilation: Mask ventilation without difficulty and Two handed mask ventilation required LMA: LMA inserted LMA Size: 5.0 Number of attempts: 1 Placement Confirmation: positive ETCO2,  CO2 detector and breath sounds checked- equal and bilateral Tube secured with: Tape Dental Injury: Teeth and Oropharynx as per pre-operative assessment

## 2014-01-12 NOTE — Op Note (Signed)
SURGEON:  Thompson Grayer, MD  PREPROCEDURE DIAGNOSES: 1. Paroxysmal atrial fibrillation. 2. Typical appearing atrial flutter  POSTPROCEDURE DIAGNOSES: 1. Paroxysmal  atrial fibrillation. 2. Typical appearing atrial flutter  PROCEDURES: 1. Comprehensive electrophysiologic study. 2. Coronary sinus pacing and recording. 3. Three-dimensional mapping of atrial fibrillation with additional mapping and ablation of a second discrete focus (atrial flutter) 4. Ablation of atrial fibrillation with additional mapping and ablation of a second discrete focus (atrial flutter) 5. Intracardiac echocardiography. 6. Transseptal puncture of an intact septum. 7. Rotational Angiography with processing at an independent workstation  INTRODUCTION:  DIXIE JAFRI is a 58 y.o. male with a history of paroxysmal atrial fibrillation and typical appearing atrial flutter who now presents for EP study and radiofrequency ablation.  The patient reports initially being diagnosed with atrial fibrillation after presenting with symptomatic palpitations and fatgiue. The patient reports increasing frequency and duration of atrial arrhythmias since that time.  The patient has failed medical therapy with multaq and tikosyn.  The patient therefore presents today for catheter ablation of atrial fibrillation and atrial flutter.  DESCRIPTION OF PROCEDURE:  Informed written consent was obtained, and the patient was brought to the electrophysiology lab in a fasting state.  The patient was adequately sedated with intravenous medications as outlined in the anesthesia report.  The patient's left and right groins were prepped and draped in the usual sterile fashion by the EP lab staff.  Using a percutaneous Seldinger technique, two 7-French and one 11-French hemostasis sheaths were placed into the right common femoral vein.    3 Dimensional Rotational Angiography: A 5 french pigtail catheter was introduced through the right common femoral  vein and advanced into the inferior venocava.  3 demential rotational angiography was then performed by power injection of 100cc of nonionic contrast.  Reprocessing at an independent work station was then performed.   This demonstrated a moderate sized left atrium with 4 separate pulmonary veins which were also moderate in size.  There were no anomalous veins or significant abnormalities.  A 3 dimensional rendering of the left atrium was then merged using [employer] onto the [employer] system and registered with intracardiac echo (see below).  The pigtail catheter was then removed.  Catheter Placement:  A 7-French Biosense Webster Decapolar coronary sinus catheter was introduced through the right common femoral vein and advanced into the coronary sinus for recording and pacing from this location.  A quadrapolar catheter was introduced through the right common femoral vein and advanced into the right ventricle for recording and pacing.  This catheter was then pulled back to the His bundle location.    Initial Measurements: The patient presented to the electrophysiology lab in sinus rhythm.  The patients PR interval measured 181 msec with a QRS duration of 111 msec and a QT interval of 361 msec.  The AH interval measured 63 msec and the HV interval measured 53 msec.     Intracardiac Echocardiography: A 10-French Biosense Webster AcuNav intracardiac echocardiography catheter was introduced through the left common femoral vein and advanced into the right atrium. Intracardiac echocardiography was performed of the left atrium, and a three-dimensional anatomical rendering of the left atrium was performed using CARTO sound technology.  The patient was noted to have a moderate sized left atrium.  The interatrial septum was prominent but not aneurysmal. All 4 pulmonary veins were visualized and noted to have separate ostia.  The pulmonary veins were moderate in size.  The left atrial appendage was  visualized and  did not reveal thrombus.   There was no evidence of pulmonary vein stenosis.   Transseptal Puncture: The middle right common femoral vein sheath was exchanged for an 8.5 [place] SL2 transseptal sheath and transseptal access was achieved in a standard fashion using a Brockenbrough needle under biplane fluoroscopy with intracardiac echocardiography confirmation of the transseptal puncture.  Once transseptal access had been achieved, heparin was administered intravenously and intra- arterially in order to maintain an ACT of greater than 300 seconds throughout the procedure.   3D Mapping and Ablation: The His bundle catheter was removed and in its place a 3.5 mm Biosense TransMontaigne ablation catheter was advanced into the right atrium.  The transseptal sheath was pulled back into the IVC over a guidewire.  The ablation catheter was advanced across the transseptal hole using the wire as a guide.  The transseptal sheath was then re-advanced over the guidewire into the left atrium.  A duodecapolar Biosense Webster circular mapping catheter was introduced through the transseptal sheath and positioned over the mouth of all 4 pulmonary veins.  Three-dimensional electroanatomical mapping was performed using CARTO technology.  This demonstrated electrical activity within all four pulmonary veins at baseline. The patient underwent successful sequential electrical isolation and anatomical encircling of all four pulmonary veins using radiofrequency current with a circular mapping catheter as a guide.   The ablation catheter was then pulled back into the right atrium and exchanged for a 43F biosense webster 63mm atrial flutter catheter.  This catheter was positioned along the cavo-tricuspid isthmus.  Mapping along the atrial side of the isthmus was performed.  This demonstrated a standard isthmus.  A series of radiofrequency applications were then delivered along the isthmus.  Complete  bidirectional cavotricuspid isthmus block was achieved as confirmed by differential atrial pacing from the low lateral right atrium.  A stimulus to earliest atrial activation across the isthmus measured 150 msec bi-directionally.  The patient was observe without return of conduction through the isthmus.  Measurements Following Ablation: Following ablation, there was no inducible atrial fibrillation, atrial tachycardia, atrial flutter, or sustained PACs. In sinus rhythm with RR interval was 606 msec, with PR 142 msec, QRS 147 msec, and Qt 397 msec.  Following ablation the AH interval measured 86 msec with an HV interval of 51 msec. Ventricular pacing was performed, which revealed midline concentric VA conduction with a retrograde VA block CL of 300 msec. Rapid atrial pacing was performed, which revealed an AV Wenckebach cycle length of 320 msec.  Electroisolation was then again confirmed in all four pulmonary veins.  Intracardiac echocardiography was again performed, which revealed no pericardial effusion.  The procedure was therefore considered completed.  All catheters were removed, and the sheaths were aspirated and flushed.  The patient was transferred to the recovery area for sheath removal per protocol.  A limited bedside transthoracic echocardiogram revealed no pericardial effusion.  There were no early apparent complications.  CONCLUSIONS: 1. Sinus rhythm upon presentation.   2. Rotational Angiography reveals a moderate sized left atrium with four separate pulmonary veins without evidence of pulmonary vein stenosis. 3. Successful electrical isolation and anatomical encircling of all four pulmonary veins with radiofrequency current.    4. Cavo-tricuspid isthmus ablation was performed with complete bidirectional isthmus block achieved.  5. No inducible arrhythmias following ablation  6. No early apparent complications.   Jeneen Rinks Yamina Lenis,MD 11:24 AM 01/12/2014

## 2014-01-12 NOTE — Progress Notes (Signed)
Utilization Review Completed.[employer] Severin T Dowell5/22/2015  

## 2014-01-12 NOTE — H&P (Signed)
Primary Care Physician: Eric Post, MD  Referring Physician: Dr Darlyn Chamber is a 58 y.o. male with a h/o CAD status Palmer PCI in 1996, paroxysmal atrial fibrillation, diabetes, OSA, And tobacco abuse. Patient was first diagnosed with atrial fibrillation 05/2012. He converted to NSR with IV diltiazem and reports no incidence of atrial fibrillation until this February. He failed medical therapy with multaq and recently has been placed on tikosyn. He continues to have afib on a daily basis despite medical therapy with tikosyn. He reports fatigue and SOB with afib. This is worse when ventricular rates are elevated.   Past Medical History   Diagnosis  Date   .  THRUSH  03/18/2009   .  HYPERLIPIDEMIA  11/01/2008   .  Coronary artery disease    .  Paroxysmal atrial fibrillation    .  Pure hypercholesterolemia    .  Coronary atherosclerosis of native coronary artery    .  Internal hemorrhoids without mention of complication    .  ED (erectile dysfunction)    .  Obesity    .  Anal fissure    .  Anal or rectal pain    .  Paresthesia of both legs    .  Smoker      QUIT 10/13/13   .  Hx of echocardiogram      Echo (10/2013): Mild LVH, moderate focal basal hypertrophy of the septum, EF 50-55%, no RWMA   .  Seasonal asthma    .  Chronic bronchitis      "get it q spring and fall" (10/27/2013)   .  OSA on CPAP    .  DM type 2, uncontrolled, with neuropathy     Past Surgical History   Procedure  Laterality  Date   .  Tonsillectomy and adenoidectomy   1979   .  Inguinal hernia repair  Right  1963   .  Coronary angioplasty with stent placement   1996     residual 50% LAD and RCA by cath in 2007   .  Cardiac catheterization   2007   .  Anal fissure repair   ~ 2010    Current Outpatient Prescriptions   Medication  Sig  Dispense  Refill   .  atorvastatin (LIPITOR) 40 MG tablet  Take 40 mg by mouth daily with supper.     .  diltiazem (CARDIZEM CD) 240 MG 24 hr capsule  Take 1 capsule  (240 mg total) by mouth 2 (two) times daily.  180 capsule  3   .  dofetilide (TIKOSYN) 500 MCG capsule  Take 1 capsule (500 mcg total) by mouth every 12 (twelve) hours.  60 capsule  4   .  fish oil-omega-3 fatty acids 1000 MG capsule  Take 1 g by mouth 2 (two) times daily.     .  Fluticasone-Salmeterol (ADVAIR) 100-50 MCG/DOSE AEPB  Inhale 1 puff into the lungs at bedtime.     .  metFORMIN (GLUCOPHAGE) 500 MG tablet  Take 500 mg by mouth 2 (two) times daily with a meal. With breakfast and supper     .  montelukast (SINGULAIR) 10 MG tablet  Take 10 mg by mouth daily with supper.     .  Rivaroxaban (XARELTO) 20 MG TABS tablet  Take 20 mg by mouth daily with breakfast.      No current facility-administered medications for this visit.    Allergies   Allergen  Reactions   .  Cephalosporins  Anaphylaxis   .  Penicillins  Anaphylaxis   .  Codeine  Itching   .  Gabapentin  Other (See Comments)     "made me feel drugged"    History    Social History   .  Marital Status:  Married     Spouse Name:  N/A     Number of Children:  N/A   .  Years of Education:  N/A    Occupational History   .  Not on file.    Social History Main Topics   .  Smoking status:  Current Every Day Smoker -- 0.00 packs/day for 35 years     Types:  Cigarettes   .  Smokeless tobacco:  Not on file      Comment: 10/27/2013 "I'm down to 3-6 cigarettes/day at this time"   .  Alcohol Use:  Yes      Comment: 10/27/2013 "might have 1 beer/month"   .  Drug Use:  No   .  Sexual Activity:  Yes    Other Topics  Concern   .  Not on file    Social History Narrative    Works as an Programme researcher, broadcasting/film/video for [employer] History   Problem  Relation  Age of Onset   .  Hyperlipidemia  Other    .  Heart disease  Other    .  Heart disease  Mother  35     Arrhythmia   .  Heart disease  Father  38     died 25 CHF   ROS- All systems are reviewed and negative except as per the HPI above   Physical Exam:  Filed Vitals:   01/12/14 0540   BP: 144/93  Pulse: 83  Temp: 97.6 F (36.4 C)  Resp: 18   GEN- The patient is well appearing, alert and oriented x 3 today.  Head- normocephalic, atraumatic  Eyes- Sclera clear, conjunctiva pink  Ears- hearing intact  Oropharynx- clear  Neck- supple, no JVP  Lymph- no cervical lymphadenopathy  Lungs- Clear to ausculation bilaterally, normal work of breathing  Heart- regular rate and rhythm, no murmurs, rubs or gallops, PMI not laterally displaced  GI- soft, NT, ND, + BS  Extremities- no clubbing, cyanosis, or edema  MS- no significant deformity or atrophy  Skin- no rash or lesion  Psych- euthymic mood, full affect  Neuro- strength and sensation are intact   Assessment and Plan:  1. afib  The patient has symptomatic atrial fibrillation and atrial flutter. He has failed medical therapy with multaq and tikosyn.  Therapeutic strategies for afib and atrial flutter including medicine and ablation were discussed in detail with the patient today. Risk, benefits, and alternatives to EP study and radiofrequency ablation were also discussed in detail today. These risks include but are not limited to stroke, bleeding, vascular damage, tamponade, perforation, damage to the esophagus, lungs, and other structures, pulmonary vein stenosis, worsening renal function, and death. The patient understands these risk and wishes to proceed.

## 2014-01-12 NOTE — Progress Notes (Signed)
Md notified. Pain medication given to pt and no relief of headache.  Awaiting md, will continue to monitor. Wynona Canes

## 2014-01-13 ENCOUNTER — Ambulatory Visit (HOSPITAL_COMMUNITY): Payer: 59

## 2014-01-13 LAB — BASIC METABOLIC PANEL
BUN: 11 mg/dL (ref 6–23)
CO2: 30 mEq/L (ref 19–32)
Calcium: 8.8 mg/dL (ref 8.4–10.5)
Chloride: 98 mEq/L (ref 96–112)
Creatinine, Ser: 0.96 mg/dL (ref 0.50–1.35)
GFR calc Af Amer: >90 mL/min (ref >90)
GFR calc non Af Amer: >90 mL/min (ref >90)
Glucose, Bld: 183 mg/dL — ABNORMAL HIGH (ref 70–99)
Potassium: 4.1 mEq/L (ref 3.7–5.3)
Sodium: 137 mEq/L (ref 137–147)

## 2014-01-13 MED ORDER — PROMETHAZINE HCL 25 MG/ML IJ SOLN: 6.2500 mg | INTRAMUSCULAR | Status: DC | PRN

## 2014-01-13 MED ORDER — MORPHINE SULFATE 2 MG/ML IJ SOLN
2.0000 mg | INTRAMUSCULAR | Status: DC | PRN
  Administered 2014-01-13: 2 mg via INTRAVENOUS
  Filled 2014-01-13: qty 1

## 2014-01-13 MED ORDER — PANTOPRAZOLE SODIUM 40 MG PO TBEC: 40.0000 mg | DELAYED_RELEASE_TABLET | Freq: Every day | ORAL | Status: DC

## 2014-01-13 NOTE — Progress Notes (Signed)
Pt c/o chest discomfort when taking a breath in.  Md notified new orders received. Will continue to monitor. Wynona Canes

## 2014-01-13 NOTE — Discharge Instructions (Signed)
No driving for 5 days. No lifting over 5 lbs for 1 week. No sexual activity for 1 week. You may return to work on 01/22/14. Keep procedure site clean & dry. If you notice increased pain, swelling, bleeding or pus, call/return!  You may shower, but no soaking baths/hot tubs/pools for 1 week.

## 2014-01-16 LAB — GLUCOSE, CAPILLARY: Glucose-Capillary: 217 mg/dL — ABNORMAL HIGH (ref 70–99)

## 2014-01-17 ENCOUNTER — Telehealth: Payer: Self-pay | Admitting: *Deleted

## 2014-01-17 NOTE — Telephone Encounter (Signed)
PA for pantoprazole to  [facility]

## 2014-01-29 ENCOUNTER — Ambulatory Visit (INDEPENDENT_AMBULATORY_CARE_PROVIDER_SITE_OTHER): Payer: 59 | Admitting: Interventional Cardiology

## 2014-01-29 ENCOUNTER — Encounter: Payer: Self-pay | Admitting: Interventional Cardiology

## 2014-01-29 VITALS — BP 130/70 | HR 96 | Ht 74.0 in | Wt 276.0 lb

## 2014-01-29 DIAGNOSIS — N529 Male erectile dysfunction, unspecified: Secondary | ICD-10-CM

## 2014-01-29 DIAGNOSIS — I4891 Unspecified atrial fibrillation: Secondary | ICD-10-CM

## 2014-01-29 DIAGNOSIS — G4733 Obstructive sleep apnea (adult) (pediatric): Secondary | ICD-10-CM

## 2014-01-29 DIAGNOSIS — F17200 Nicotine dependence, unspecified, uncomplicated: Secondary | ICD-10-CM

## 2014-01-29 DIAGNOSIS — E785 Hyperlipidemia, unspecified: Secondary | ICD-10-CM

## 2014-01-29 DIAGNOSIS — Z5181 Encounter for therapeutic drug level monitoring: Secondary | ICD-10-CM

## 2014-01-29 DIAGNOSIS — I2510 Atherosclerotic heart disease of native coronary artery without angina pectoris: Secondary | ICD-10-CM

## 2014-01-29 DIAGNOSIS — Z79899 Other long term (current) drug therapy: Secondary | ICD-10-CM

## 2014-01-29 MED ORDER — TADALAFIL 20 MG PO TABS: 20.0000 mg | ORAL_TABLET | Freq: Every day | ORAL | Status: DC | PRN

## 2014-01-29 MED ORDER — ATORVASTATIN CALCIUM 40 MG PO TABS: 40.0000 mg | ORAL_TABLET | Freq: Every day | ORAL | Status: DC

## 2014-01-29 NOTE — Progress Notes (Signed)
Patient ID: Eric Palmer, male   DOB: [birth year], 58 y.o.   MRN: [id number]    [address], [address] [place], [place]  [address] Phone: [phone] Fax:  [phone]  Date:  01/29/2014   ID:  Eric Palmer, DOB [birth year], MRN [id number]  PCP:  [name], MD   ASSESSMENT:  1. Paroxysmal atrial fibrillation and flutter. Recent atrial ablation by Dr. [name]. The patient still feels he is having intermittent recurrences 2. Tikosyn therapy 3. Anticoagulation therapy with , Xarelto 4. Worsening sleep apnea. If he falls asleep without his CPAP, for instance taking a nap, he chokes and feels as though he will smother 5. Hypertension 6. Obesity  PLAN:  1. check magnesium and potassium level in 2 weeks 2. Appointment with sleep specialist, Dr. Radford Pax 3. Clinical followup with me in 4 months 4. Diet and exercise to lose weight   SUBJECTIVE: Eric Palmer is a 58 y.o. male who had an atrial ablation performed for paroxysmal atrial fib and flutter. He is on Tikosyn and Xarelto. He denies anginal quality discomfort but still has occasional palpitations that caused him to feel short of breath and uncomfortable in his chest. When the palpitations go away the discomfort disappears as well. He has not had syncope. He's been compliant with the medical regimen. He is somewhat disappointed that he is still on medications as he felt that the ablation would allow him to get off medications. He is under stress at work as  [employer] is being sold/emerging with [employer].   Wt Readings from Last 3 Encounters:  01/29/14 276 lb (125.193 kg)  01/12/14 274 lb 14.6 oz (124.7 kg)  01/12/14 274 lb 14.6 oz (124.7 kg)     Past Medical History  Diagnosis Date  . HYPERLIPIDEMIA 11/01/2008  . Coronary artery disease   . Paroxysmal atrial fibrillation   . Internal hemorrhoids without mention of complication   . ED (erectile dysfunction)   . Obesity   . Anal fissure   . Paresthesia  of both legs   . Smoker     QUIT 10/13/13  . Hx of echocardiogram     Echo (10/2013): Mild LVH, moderate focal basal hypertrophy of the septum, EF 50-55%, no RWMA  . Chronic bronchitis     "get it q spring and fall" (10/27/2013)  . OSA on CPAP   . DM type 2, uncontrolled, with neuropathy   . Atrial flutter     Current Outpatient Prescriptions  Medication Sig Dispense Refill  . atorvastatin (LIPITOR) 40 MG tablet Take 40 mg by mouth daily with supper.       . diltiazem (CARDIZEM CD) 240 MG 24 hr capsule Take 1 capsule (240 mg total) by mouth 2 (two) times daily.  180 capsule  3  . dofetilide (TIKOSYN) 500 MCG capsule Take 1 capsule (500 mcg total) by mouth every 12 (twelve) hours.  60 capsule  4  . fish oil-omega-3 fatty acids 1000 MG capsule Take 1 g by mouth 2 (two) times daily.       . Fluticasone-Salmeterol (ADVAIR) 100-50 MCG/DOSE AEPB Inhale 1 puff into the lungs at bedtime.      . metFORMIN (GLUCOPHAGE) 500 MG tablet Take 500 mg by mouth 2 (two) times daily with a meal.      . montelukast (SINGULAIR) 10 MG tablet Take 10 mg by mouth daily with supper.       . pantoprazole (PROTONIX) 40 MG tablet Take 1 tablet (40  mg total) by mouth daily.  60 tablet  0  . Rivaroxaban (XARELTO) 20 MG TABS tablet Take 20 mg by mouth daily with breakfast.       No current facility-administered medications for this visit.    Allergies:    Allergies  Allergen Reactions  . Cephalosporins Anaphylaxis  . Penicillins Anaphylaxis  . Codeine Itching  . Gabapentin Other (See Comments)    "made me feel drugged"    Social History:  The patient  reports that he has quit smoking. His smoking use included Cigarettes. He smoked 0.00 packs per day for 35 years. He does not have any smokeless tobacco history on file. He reports that he drinks alcohol. He reports that he does not use illicit drugs.   ROS:  Please see the history of present illness.   Feels somewhat depressed. Has no energy.  Stopped smoking in  December. All other systems reviewed and negative.   OBJECTIVE: VS:  BP 130/70  Pulse 96  Ht 6\' 2"  (1.88 m)  Wt 276 lb (125.193 kg)  BMI 35.42 kg/m2 Well nourished, well developed, in no acute distress, has a ruddy color to his skin HEENT: normal Neck: JVD flat. Carotid bruit absent  Cardiac:  normal S1, S2; RRR; no murmur Lungs:  clear to auscultation bilaterally, no wheezing, rhonchi or rales Abd: soft, nontender, no hepatomegaly Ext: Edema absent. Pulses 2+ bilateral Skin: warm and dry Neuro:  CNs 2-12 intact, no focal abnormalities noted  EKG:  Not repeated       Signed, Illene Labrador III, MD 01/29/2014 4:31 PM

## 2014-01-29 NOTE — Patient Instructions (Addendum)
Your physician recommends that you continue on your current medications as directed. Please refer to the Current Medication list given to you today.  An Rx for Cialis and Lipitor has been sent to your pharmacy  Your physician recommends that you return for lab work on 02/12/14 (Magnesium,Potassium)  You have been referred to Dr.Turner for Sleep Apnea  Your physician wants you to follow-up in: 4-6 months You will receive a reminder letter in the mail two months in advance. If you don't receive a letter, please call our office to schedule the follow-up appointment.

## 2014-02-12 ENCOUNTER — Other Ambulatory Visit: Payer: Self-pay

## 2014-02-12 ENCOUNTER — Other Ambulatory Visit (INDEPENDENT_AMBULATORY_CARE_PROVIDER_SITE_OTHER): Payer: 59

## 2014-02-12 DIAGNOSIS — Z79899 Other long term (current) drug therapy: Secondary | ICD-10-CM

## 2014-02-12 LAB — MAGNESIUM: Magnesium: 1.7 mg/dL (ref 1.5–2.5)

## 2014-02-12 LAB — POTASSIUM: Potassium: 4.1 mEq/L (ref 3.5–5.1)

## 2014-02-15 ENCOUNTER — Telehealth: Payer: Self-pay

## 2014-02-15 NOTE — Telephone Encounter (Signed)
called to give pt lab results.lmtcb

## 2014-02-15 NOTE — Telephone Encounter (Signed)
Message copied by Lamar Laundry on Thu Feb 15, 2014 10:28 AM ------      Message from: Daneen Schick      Created: Wed Feb 14, 2014  1:47 PM       Mag and potassium are stabl ------

## 2014-03-14 ENCOUNTER — Telehealth: Payer: Self-pay | Admitting: *Deleted

## 2014-03-14 DIAGNOSIS — E119 Type 2 diabetes mellitus without complications: Secondary | ICD-10-CM

## 2014-03-14 DIAGNOSIS — E785 Hyperlipidemia, unspecified: Secondary | ICD-10-CM

## 2014-03-14 NOTE — Telephone Encounter (Signed)
Left message on machine for patient to schedule a lab appointment to have cholesterol checked. Lipid ordered Diabetic bundle

## 2014-03-17 ENCOUNTER — Other Ambulatory Visit: Payer: Self-pay | Admitting: Family Medicine

## 2014-03-31 ENCOUNTER — Other Ambulatory Visit: Payer: Self-pay | Admitting: Family Medicine

## 2014-04-09 ENCOUNTER — Encounter: Payer: Self-pay | Admitting: Cardiology

## 2014-04-10 ENCOUNTER — Ambulatory Visit (INDEPENDENT_AMBULATORY_CARE_PROVIDER_SITE_OTHER): Payer: 59 | Admitting: Cardiology

## 2014-04-10 ENCOUNTER — Encounter: Payer: Self-pay | Admitting: Cardiology

## 2014-04-10 VITALS — BP 138/81 | HR 88 | Ht 74.0 in | Wt 273.1 lb

## 2014-04-10 DIAGNOSIS — G4733 Obstructive sleep apnea (adult) (pediatric): Secondary | ICD-10-CM

## 2014-04-10 DIAGNOSIS — E669 Obesity, unspecified: Secondary | ICD-10-CM

## 2014-04-10 NOTE — Patient Instructions (Signed)
Your physician recommends that you continue on your current medications as directed. Please refer to the Current Medication list given to you today.  Your physician wants you to follow-up in: 6 months with Dr Turner You will receive a reminder letter in the mail two months in advance. If you don't receive a letter, please call our office to schedule the follow-up appointment.  

## 2014-04-10 NOTE — Progress Notes (Signed)
[address], [place], [place]  [address] Phone: [phone] Fax:  [phone]  Date:  04/10/2014   ID:  Eric Palmer, DOB 12-Nov-1955, MRN [id number]  PCP:  [name], MD  Cardiologist:  Fransico Him, MD     History of Present Illness: Eric Palmer is a 58 y.o. male with a history of ASCAD, PAF, dyslipidemia and a history of OSA who is referred by Dr. Tamala Julian for evaluation of OSA.  He was diagnosed with OSA years ago and has been on CPAP.  He uses a full face mask which he tolerated well.  He is doing well with his CPAP therapy.  Recently he has had problems with afib and Dr. Tamala Julian wanted him evaluated to make sure he is adequately treated.  He says that recently his sleep pattern has changed.  He goes to bed around 10:30pm and then wakes up around 4:30am and cannot go back to bed.  He does not know why he wakes.  He feels rested when he gets up in the am.  He does not feel sleepy during the day.  He says that on a Saturday afternoon he will sit in the recliner and doses off but wakes up very frequently.  He says that he does not feel physically tired when he tries to take a nap but yawns.  He cannot sleep without his CPAP because he will awaken gasping for air.   Wt Readings from Last 3 Encounters:  04/10/14 273 lb 1.9 oz (123.886 kg)  01/29/14 276 lb (125.193 kg)  01/12/14 274 lb 14.6 oz (124.7 kg)     Past Medical History  Diagnosis Date  . HYPERLIPIDEMIA 11/01/2008  . Coronary artery disease   . Paroxysmal atrial fibrillation   . Internal hemorrhoids without mention of complication   . ED (erectile dysfunction)   . Obesity   . Anal fissure   . Paresthesia of both legs   . Smoker     QUIT 10/13/13  . Hx of echocardiogram     Echo (10/2013): Mild LVH, moderate focal basal hypertrophy of the septum, EF 50-55%, no RWMA  . Chronic bronchitis     "get it q spring and fall" (10/27/2013)  . OSA on CPAP   . DM type 2, uncontrolled, with neuropathy   . Atrial  flutter     Current Outpatient Prescriptions  Medication Sig Dispense Refill  . atorvastatin (LIPITOR) 40 MG tablet Take 1 tablet (40 mg total) by mouth daily with supper.  30 tablet  11  . diltiazem (CARDIZEM CD) 240 MG 24 hr capsule Take 1 capsule (240 mg total) by mouth 2 (two) times daily.  180 capsule  3  . dofetilide (TIKOSYN) 500 MCG capsule Take 1 capsule (500 mcg total) by mouth every 12 (twelve) hours.  60 capsule  4  . fish oil-omega-3 fatty acids 1000 MG capsule Take 1 g by mouth 2 (two) times daily.       . Fluticasone-Salmeterol (ADVAIR) 100-50 MCG/DOSE AEPB Inhale 1 puff into the lungs at bedtime.      . metFORMIN (GLUCOPHAGE) 500 MG tablet TAKE 1 TABLET BY MOUTH TWICE DAILY WITH MEALS  60 tablet  3  . montelukast (SINGULAIR) 10 MG tablet TAKE 1 TABLET BY MOUTH AT BEDTIME  30 tablet  5  . Rivaroxaban (XARELTO) 20 MG TABS tablet Take 20 mg by mouth daily with breakfast.       No current facility-administered medications for this visit.  Allergies:    Allergies  Allergen Reactions  . Cephalosporins Anaphylaxis  . Penicillins Anaphylaxis  . Codeine Itching  . Gabapentin Other (See Comments)    "made me feel drugged"    Social History:  The patient  reports that he has quit smoking. His smoking use included Cigarettes. He smoked 0.00 packs per day for 35 years. He does not have any smokeless tobacco history on file. He reports that he drinks alcohol. He reports that he does not use illicit drugs.   Family History:  The patient's family history includes Heart disease in his other; Heart disease (age of onset: 29) in his father; Heart disease (age of onset: 1) in his mother; Hyperlipidemia in his other.   ROS:  Please see the history of present illness.      All other systems reviewed and negative.   PHYSICAL EXAM: VS:  BP 138/81  Pulse 88  Ht 6\' 2"  (1.88 m)  Wt 273 lb 1.9 oz (123.886 kg)  BMI 35.05 kg/m2 Well nourished, well developed, in no acute distress HEENT:  normal Neck: no JVD Cardiac:  normal S1, S2; RRR; no murmur Lungs:  clear to auscultation bilaterally, no wheezing, rhonchi or rales Abd: soft, nontender, no hepatomegaly Ext: no edema Skin: warm and dry Neuro:  CNs 2-12 intact, no focal abnormalities noted       ASSESSMENT AND PLAN:  1. OSA on BiPAP and appears to be tolerating it well.  His d/l today showed an AHI of 3/hr on BiPAP at 14/8cm H2O and 95% compliance in using more than 4 hours nightly.  He appears adequately treated on his BIPAP.  I suspect that his difficulty with naps is that he does not need the sleep.  I have reassured him that it is normal for people with OSA to feel they cannot sleep without their device.  I will keep him on his current settings. 2. Obesity  Followup with me in 6 months  Signed, Fransico Him, MD 04/10/2014 2:23 PM

## 2014-04-11 ENCOUNTER — Encounter: Payer: Self-pay | Admitting: Internal Medicine

## 2014-04-11 ENCOUNTER — Ambulatory Visit (INDEPENDENT_AMBULATORY_CARE_PROVIDER_SITE_OTHER): Payer: 59 | Admitting: Internal Medicine

## 2014-04-11 VITALS — BP 124/88 | HR 88 | Ht 74.0 in | Wt 272.4 lb

## 2014-04-11 DIAGNOSIS — G4733 Obstructive sleep apnea (adult) (pediatric): Secondary | ICD-10-CM

## 2014-04-11 DIAGNOSIS — I2510 Atherosclerotic heart disease of native coronary artery without angina pectoris: Secondary | ICD-10-CM

## 2014-04-11 DIAGNOSIS — I4891 Unspecified atrial fibrillation: Secondary | ICD-10-CM

## 2014-04-11 MED ORDER — APIXABAN 5 MG PO TABS
5.0000 mg | ORAL_TABLET | Freq: Two times a day (BID) | ORAL | Status: DC
Start: 2014-04-11 — End: 2015-02-26

## 2014-04-11 NOTE — Patient Instructions (Signed)
Your physician recommends that you schedule a follow-up appointment in: 3 months with Dr. [name].  Your physician has recommended you make the following change in your medication:  1.) stop Tikosyn 2.) stop xarelto 2.) start eliquis 5 mg twice a day

## 2014-04-11 NOTE — Progress Notes (Signed)
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 Patient was first diagnosed with atrial fibrillation 05/2012. He converted to NSR with IV diltiazem and reports no incidence of atrial fibrillation until this February.  He failed medical therapy with multaq and recently has been placed on tikosyn. He underwent catheter ablation 5/22 after continuing to have breakthrough afib  on Tikosyn.  He reports today he feels some irregular heart beat but is nonsustained and much better than prior to ablation. He is having some daily nausea and may be from [place].However he has a chadsvasc score of 2 and needs to continue anticoagulant. He is willing to stop xarelto and try eloquis to see if symptoms improve.  Past Medical History  Diagnosis Date  . HYPERLIPIDEMIA 11/01/2008  . Coronary artery disease   . Paroxysmal atrial fibrillation   . Internal hemorrhoids without mention of complication   . ED (erectile dysfunction)   . Obesity   . Anal fissure   . Paresthesia of both legs   . Smoker     QUIT 10/13/13  . Hx of echocardiogram     Echo (10/2013): Mild LVH, moderate focal basal hypertrophy of the septum, EF 50-55%, no RWMA  . Chronic bronchitis     "get it q spring and fall" (10/27/2013)  . OSA on CPAP   . DM type 2, uncontrolled, with neuropathy   . Atrial flutter    Past Surgical History  Procedure Laterality Date  . Tonsillectomy and adenoidectomy  1979  . Inguinal hernia repair Right 1963  . Coronary angioplasty with stent placement  1996    residual 50% LAD and RCA by cath in 2007  . Cardiac catheterization  2007  . Anal fissure repair  ~ 2010  . Coronary angioplasty with stent placement  1995  . Tee without cardioversion N/A 01/11/2014    Procedure: TRANSESOPHAGEAL ECHOCARDIOGRAM (TEE);  Surgeon: [name], MD;   Location: [facility];  Service: Cardiovascular;  Laterality: N/A;  . Ablation  01-12-2014    PVI and CTI by Dr [name]    Current Outpatient Prescriptions  Medication Sig Dispense Refill  . atorvastatin (LIPITOR) 40 MG tablet Take 1 tablet (40 mg total) by mouth daily with supper.  30 tablet  11  . diltiazem (CARDIZEM CD) 240 MG 24 hr capsule Take 1 capsule (240 mg total) by mouth 2 (two) times daily.  180 capsule  3  . fish oil-omega-3 fatty acids 1000 MG capsule Take 1 g by mouth 2 (two) times daily.       . Fluticasone-Salmeterol (ADVAIR) 100-50 MCG/DOSE AEPB Inhale 1 puff into the lungs at bedtime.      . metFORMIN (GLUCOPHAGE) 500 MG tablet TAKE 1 TABLET BY MOUTH TWICE DAILY WITH MEALS  60 tablet  3  . montelukast (SINGULAIR) 10 MG tablet TAKE 1 TABLET BY MOUTH AT BEDTIME  30 tablet  5  . apixaban (ELIQUIS) 5 MG TABS tablet Take 1 tablet (5 mg total) by mouth 2 (two) times daily.  60 tablet  11   No current facility-administered medications for this visit.    Allergies  Allergen Reactions  . Cephalosporins Anaphylaxis  . Penicillins Anaphylaxis  . Codeine Itching  . Gabapentin Other (See Comments)    "made me feel drugged"    History   Social  History  . Marital Status: Married    Spouse Name: N/A    Number of Children: N/A  . Years of Education: N/A   Occupational History  . Not on file.   Social History Main Topics  . Smoking status: Former Smoker -- 0.00 packs/day for 35 years    Types: Cigarettes  . Smokeless tobacco: Not on file  . Alcohol Use: Yes     Comment: 10/27/2013 "might have 1 beer/month"  . Drug Use: No  . Sexual Activity: Yes   Other Topics Concern  . 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 Hyperlipidemia Other   . Heart disease Other   . Heart disease Mother 68    Arrhythmia  . Heart disease Father 53    died 35 CHF    ROS- All systems are  reviewed and negative except as per the HPI above  Physical Exam: Filed Vitals:   04/11/14 1605  BP: 124/88  Pulse: 88  Height: 6\' 2"  (1.88 m)  Weight: 123.56 kg (272 lb 6.4 oz)    GEN- The patient is well appearing, alert and oriented x 3 today.   Head- normocephalic, atraumatic Eyes-  Sclera clear, conjunctiva pink Ears- hearing intact Oropharynx- clear Neck- supple, no JVP Lymph- no cervical lymphadenopathy Lungs- Clear to ausculation bilaterally, normal work of breathing Heart- regular rate and rhythm, no murmurs, rubs or gallops, PMI not laterally displaced GI- soft, NT, ND, + BS Extremities- no clubbing, cyanosis, or edema MS- no significant deformity or atrophy Skin- no rash or lesion Psych- euthymic mood, full affect Neuro- strength and sensation are intact  EKG- today reveals sinus rhythm, 88bpm, Qtc 44ms.   Assessment and Plan:   1. Afib, S/p ablation 5/22 ERAF initially following ablation, now improved. Stop Tikosyn,continue diltiazem. Chadsvasc score is 2 and will have to continue anticoagulent.   2. OSA Compliance with cpap is encouraged  3. CAD Stable No change required today  4. Nausea May be from Xarelto D/C and start  eloquis 5 mg bid.  5. Recheck in 3 months.

## 2014-04-16 ENCOUNTER — Encounter: Payer: 59 | Admitting: Internal Medicine

## 2014-05-07 ENCOUNTER — Ambulatory Visit (INDEPENDENT_AMBULATORY_CARE_PROVIDER_SITE_OTHER): Payer: 59 | Admitting: Family Medicine

## 2014-05-07 ENCOUNTER — Encounter: Payer: Self-pay | Admitting: Family Medicine

## 2014-05-07 VITALS — BP 130/68 | HR 99 | Temp 98.0°F | Wt 271.0 lb

## 2014-05-07 DIAGNOSIS — J302 Other seasonal allergic rhinitis: Secondary | ICD-10-CM

## 2014-05-07 DIAGNOSIS — I4891 Unspecified atrial fibrillation: Secondary | ICD-10-CM

## 2014-05-07 DIAGNOSIS — J309 Allergic rhinitis, unspecified: Secondary | ICD-10-CM

## 2014-05-07 DIAGNOSIS — J018 Other acute sinusitis: Secondary | ICD-10-CM

## 2014-05-07 DIAGNOSIS — E119 Type 2 diabetes mellitus without complications: Secondary | ICD-10-CM

## 2014-05-07 MED ORDER — ONETOUCH DELICA LANCETS FINE MISC: Status: DC

## 2014-05-07 MED ORDER — AZITHROMYCIN 250 MG PO TABS: ORAL_TABLET | ORAL | Status: AC

## 2014-05-07 MED ORDER — GLUCOSE BLOOD VI STRP: ORAL_STRIP | Status: DC

## 2014-05-07 MED ORDER — LEVOCETIRIZINE DIHYDROCHLORIDE 5 MG PO TABS
5.0000 mg | ORAL_TABLET | Freq: Every evening | ORAL | Status: DC
Start: 2014-05-07 — End: 2014-07-18

## 2014-05-07 NOTE — Progress Notes (Signed)
Pre visit review using our clinic review tool, if applicable. No additional management support is needed unless otherwise documented below in the visit note. 

## 2014-05-07 NOTE — Progress Notes (Signed)
Subjective:    Patient ID: Eric Palmer, male    DOB: 23-Nov-1955, 58 y.o.   MRN: [id number]  HPI Patient seen with over one week history of pan sinusitis symptoms. Has history of frequent sinusitis in the past. Thinks it started after mowing some grass with congestive symptoms. Colored nasal discharge. Increased malaise. Cough. Occasional headaches. No fever.  Patient has taken Zyrtec and Allegra in the past for allergies without much improvement. Has used Singulair with some improvement in past.  Type II diabetic. Not monitoring blood sugars. Last A1c was over a year ago. He needs complete physical. Does not have home monitoring system. No symptoms of polyuria or polydipsia  Atrial fibrillation with recent ablation. Overall feels well. He quit smoking in February.  Past Medical History  Diagnosis Date  . HYPERLIPIDEMIA 11/01/2008  . Coronary artery disease   . Paroxysmal atrial fibrillation   . Internal hemorrhoids without mention of complication   . ED (erectile dysfunction)   . Obesity   . Anal fissure   . Paresthesia of both legs   . Smoker     QUIT 10/13/13  . Hx of echocardiogram     Echo (10/2013): Mild LVH, moderate focal basal hypertrophy of the septum, EF 50-55%, no RWMA  . Chronic bronchitis     "get it q spring and fall" (10/27/2013)  . OSA on CPAP   . DM type 2, uncontrolled, with neuropathy   . Atrial flutter    Past Surgical History  Procedure Laterality Date  . Tonsillectomy and adenoidectomy  1979  . Inguinal hernia repair Right 1963  . Coronary angioplasty with stent placement  1996    residual 50% LAD and RCA by cath in 2007  . Cardiac catheterization  2007  . Anal fissure repair  ~ 2010  . Coronary angioplasty with stent placement  1995  . Tee without cardioversion N/A 01/11/2014    Procedure: TRANSESOPHAGEAL ECHOCARDIOGRAM (TEE);  Surgeon: [name], MD;  Location: [facility];  Service: Cardiovascular;  Laterality: N/A;  . Ablation  01-12-2014      PVI and CTI by Dr [name]    reports that he has quit smoking. His smoking use included Cigarettes. He smoked 0.00 packs per day for 35 years. He does not have any smokeless tobacco history on file. He reports that he drinks alcohol. He reports that he does not use illicit drugs. family history includes Heart disease in his other; Heart disease (age of onset: 58) in his father; Heart disease (age of onset: 24) in his mother; Hyperlipidemia in his other. Allergies  Allergen Reactions  . Cephalosporins Anaphylaxis  . Penicillins Anaphylaxis  . Codeine Itching  . Gabapentin Other (See Comments)    "made me feel drugged"      Review of Systems  Constitutional: Negative for appetite change and unexpected weight change.  HENT: Positive for congestion, sinus pressure and sore throat. Negative for ear pain.   Respiratory: Positive for cough. Negative for shortness of breath and wheezing.   Cardiovascular: Negative for chest pain and palpitations.  Endocrine: Negative for polydipsia and polyuria.  Neurological: Positive for headaches.       Objective:   Physical Exam  Constitutional: He appears well-developed and well-nourished.  HENT:  Right Ear: External ear normal.  Left Ear: External ear normal.  Mouth/Throat: Oropharynx is clear and moist.  Neck: Neck supple. No thyromegaly present.  Cardiovascular: Normal rate and regular rhythm.   Pulmonary/Chest: Effort normal and breath sounds  normal. No respiratory distress. He has no wheezes. He has no rales.  Musculoskeletal: He exhibits no edema.  Lymphadenopathy:    He has no cervical adenopathy.          Assessment & Plan:  #1 acute pansinusitis. Zithromax for 5 days #2 allergic rhinitis  Xyzal 5 mg 1 daily. Consider addition back of Singulair if symptoms persist. #3 type 2 diabetes. Home glucose monitor given with instructions for use. Schedule complete physical and obtain labs including hemoglobin A1c then #4 history of  atrial fibrillation with recent ablation. Clinically, in sinus rhythm today

## 2014-05-07 NOTE — Patient Instructions (Signed)
Schedule for complete physical at some point this year.

## 2014-05-23 ENCOUNTER — Other Ambulatory Visit (INDEPENDENT_AMBULATORY_CARE_PROVIDER_SITE_OTHER): Payer: 59

## 2014-05-23 DIAGNOSIS — Z0000 Encounter for general adult medical examination without abnormal findings: Secondary | ICD-10-CM

## 2014-05-23 DIAGNOSIS — E785 Hyperlipidemia, unspecified: Secondary | ICD-10-CM

## 2014-05-23 LAB — CBC WITH DIFFERENTIAL/PLATELET
Basophils Absolute: 0.1 10*3/uL (ref 0.0–0.1)
Basophils Relative: 0.7 % (ref 0.0–3.0)
Eosinophils Absolute: 0.5 10*3/uL (ref 0.0–0.7)
Eosinophils Relative: 6.1 % — ABNORMAL HIGH (ref 0.0–5.0)
HCT: 44.3 % (ref 39.0–52.0)
Hemoglobin: 15.1 g/dL (ref 13.0–17.0)
Lymphocytes Relative: 24 % (ref 12.0–46.0)
Lymphs Abs: 1.9 10*3/uL (ref 0.7–4.0)
MCHC: 34 g/dL (ref 30.0–36.0)
MCV: 89.7 fl (ref 78.0–100.0)
Monocytes Absolute: 0.7 10*3/uL (ref 0.1–1.0)
Monocytes Relative: 8.8 % (ref 3.0–12.0)
Neutro Abs: 4.9 10*3/uL (ref 1.4–7.7)
Neutrophils Relative %: 60.4 % (ref 43.0–77.0)
Platelets: 260 10*3/uL (ref 150.0–400.0)
RBC: 4.94 Mil/uL (ref 4.22–5.81)
RDW: 13.2 % (ref 11.5–15.5)
WBC: 8.1 10*3/uL (ref 4.0–10.5)

## 2014-05-23 LAB — TSH: TSH: 1.87 u[IU]/mL (ref 0.35–4.50)

## 2014-05-23 LAB — BASIC METABOLIC PANEL
BUN: 12 mg/dL (ref 6–23)
CO2: 26 mEq/L (ref 19–32)
Calcium: 9.2 mg/dL (ref 8.4–10.5)
Chloride: 103 mEq/L (ref 96–112)
Creatinine, Ser: 1 mg/dL (ref 0.4–1.5)
GFR: 79.9 mL/min (ref >60.00)
Glucose, Bld: 154 mg/dL — ABNORMAL HIGH (ref 70–99)
Potassium: 4.6 mEq/L (ref 3.5–5.1)
Sodium: 138 mEq/L (ref 135–145)

## 2014-05-23 LAB — POCT URINALYSIS DIPSTICK
Bilirubin, UA: NEGATIVE
Blood, UA: NEGATIVE
Glucose, UA: NEGATIVE
Ketones, UA: NEGATIVE
Leukocytes, UA: NEGATIVE
Nitrite, UA: NEGATIVE
Spec Grav, UA: 1.02
Urobilinogen, UA: 0.2
pH, UA: 5.5

## 2014-05-23 LAB — MICROALBUMIN / CREATININE URINE RATIO
Creatinine,U: 216.6 mg/dL
Microalb Creat Ratio: 3.4 mg/g (ref 0.0–30.0)
Microalb, Ur: 7.3 mg/dL — ABNORMAL HIGH (ref 0.0–1.9)

## 2014-05-23 LAB — PSA: PSA: 0.44 ng/mL (ref 0.10–4.00)

## 2014-05-23 LAB — HEPATIC FUNCTION PANEL
ALT: 36 U/L (ref 0–53)
AST: 23 U/L (ref 0–37)
Albumin: 4.1 g/dL (ref 3.5–5.2)
Alkaline Phosphatase: 76 U/L (ref 39–117)
Bilirubin, Direct: 0.2 mg/dL (ref 0.0–0.3)
Total Bilirubin: 0.9 mg/dL (ref 0.2–1.2)
Total Protein: 7.2 g/dL (ref 6.0–8.3)

## 2014-05-23 LAB — LIPID PANEL
Cholesterol: 115 mg/dL (ref 0–200)
HDL: 32.5 mg/dL — ABNORMAL LOW (ref >39.00)
NonHDL: 82.5
Total CHOL/HDL Ratio: 4
Triglycerides: 256 mg/dL — ABNORMAL HIGH (ref 0.0–149.0)
VLDL: 51.2 mg/dL — ABNORMAL HIGH (ref 0.0–40.0)

## 2014-05-23 LAB — HEMOGLOBIN A1C: Hgb A1c MFr Bld: 7.7 % — ABNORMAL HIGH (ref 4.6–6.5)

## 2014-05-23 LAB — LDL CHOLESTEROL, DIRECT: Direct LDL: 44.4 mg/dL

## 2014-05-28 ENCOUNTER — Encounter: Payer: Self-pay | Admitting: Family Medicine

## 2014-05-28 ENCOUNTER — Ambulatory Visit (INDEPENDENT_AMBULATORY_CARE_PROVIDER_SITE_OTHER): Payer: 59 | Admitting: Family Medicine

## 2014-05-28 VITALS — BP 132/74 | HR 90 | Temp 98.0°F | Ht 74.0 in | Wt 270.0 lb

## 2014-05-28 DIAGNOSIS — Z0000 Encounter for general adult medical examination without abnormal findings: Secondary | ICD-10-CM

## 2014-05-28 DIAGNOSIS — E1165 Type 2 diabetes mellitus with hyperglycemia: Secondary | ICD-10-CM

## 2014-05-28 DIAGNOSIS — IMO0002 Reserved for concepts with insufficient information to code with codable children: Secondary | ICD-10-CM

## 2014-05-28 MED ORDER — METFORMIN HCL 1000 MG PO TABS: 1000.0000 mg | ORAL_TABLET | Freq: Two times a day (BID) | ORAL | Status: DC

## 2014-05-28 NOTE — Patient Instructions (Signed)
Increase metformin to 1000 mg twice daily.

## 2014-05-28 NOTE — Progress Notes (Signed)
Pre visit review using our clinic review tool, if applicable. No additional management support is needed unless otherwise documented below in the visit note. 

## 2014-05-28 NOTE — Progress Notes (Signed)
Subjective:    Patient ID: Eric Palmer, male    DOB: [birth year], 57 y.o.   MRN: [id number]  HPI Patient here for complete physical. He has history of obesity, type 2 diabetes, CAD, atrial fibrillation, metabolic syndrome. He quit smoking last year. He had ablation procedure for atrial fibrillation May of this year. That went well. Patient states he had colonoscopy age 8 which was basically normal. He is currently taking Eliquis as anticoagulant.  Does not monitor blood sugars regularly. He gets activity through yard work but no other formal exercise. Denies any recent chest pains. He continues to have some allergy issues off and on. Medications reviewed. History asthma which is currently stable with once daily use of Advair. He received flu vaccine through work. Had previous pneumococcal vaccine.  Past Medical History  Diagnosis Date  . HYPERLIPIDEMIA 11/01/2008  . Coronary artery disease   . Paroxysmal atrial fibrillation   . Internal hemorrhoids without mention of complication   . ED (erectile dysfunction)   . Obesity   . Anal fissure   . Paresthesia of both legs   . Smoker     QUIT 10/13/13  . Hx of echocardiogram     Echo (10/2013): Mild LVH, moderate focal basal hypertrophy of the septum, EF 50-55%, no RWMA  . Chronic bronchitis     "get it q spring and fall" (10/27/2013)  . OSA on CPAP   . DM type 2, uncontrolled, with neuropathy   . Atrial flutter    Past Surgical History  Procedure Laterality Date  . Tonsillectomy and adenoidectomy  1979  . Inguinal hernia repair Right 1963  . Coronary angioplasty with stent placement  1996    residual 50% LAD and RCA by cath in 2007  . Cardiac catheterization  2007  . Anal fissure repair  ~ 2010  . Coronary angioplasty with stent placement  1995  . Tee without cardioversion N/A 01/11/2014    Procedure: TRANSESOPHAGEAL ECHOCARDIOGRAM (TEE);  Surgeon: [name], MD;  Location: [facility];  Service: Cardiovascular;  Laterality:  N/A;  . Ablation  01-12-2014    PVI and CTI by Dr [name]    reports that he has quit smoking. His smoking use included Cigarettes. He smoked 0.00 packs per day for 35 years. He does not have any smokeless tobacco history on file. He reports that he drinks alcohol. He reports that he does not use illicit drugs. family history includes Heart disease in his other; Heart disease (age of onset: 16) in his father; Heart disease (age of onset: 39) in his mother; Hyperlipidemia in his other. Allergies  Allergen Reactions  . Cephalosporins Anaphylaxis  . Penicillins Anaphylaxis  . Codeine Itching  . Gabapentin Other (See Comments)    "made me feel drugged"      Review of Systems  Constitutional: Negative for fever, activity change, appetite change and fatigue.  HENT: Negative for congestion, ear pain and trouble swallowing.   Eyes: Negative for pain and visual disturbance.  Respiratory: Negative for cough, shortness of breath and wheezing.   Cardiovascular: Negative for chest pain and palpitations.  Gastrointestinal: Negative for nausea, vomiting, abdominal pain, diarrhea, constipation, blood in stool, abdominal distention and rectal pain.  Genitourinary: Negative for dysuria, hematuria and testicular pain.  Musculoskeletal: Negative for arthralgias and joint swelling.  Skin: Negative for rash.  Neurological: Negative for dizziness, syncope and headaches.  Hematological: Negative for adenopathy.  Psychiatric/Behavioral: Negative for confusion and dysphoric mood.  Objective:   Physical Exam  Constitutional: He is oriented to person, place, and time. He appears well-developed and well-nourished. No distress.  HENT:  Head: Normocephalic and atraumatic.  Right Ear: External ear normal.  Left Ear: External ear normal.  Mouth/Throat: Oropharynx is clear and moist.  Eyes: Conjunctivae and EOM are normal. Pupils are equal, round, and reactive to light.  Neck: Normal range of motion. Neck  supple. No thyromegaly present.  Cardiovascular: Normal rate, regular rhythm and normal heart sounds.   No murmur heard. Pulmonary/Chest: No respiratory distress. He has no wheezes. He has no rales.  Abdominal: Soft. Bowel sounds are normal. He exhibits no distension and no mass. There is no tenderness. There is no rebound and no guarding.  Musculoskeletal: He exhibits no edema.  Lymphadenopathy:    He has no cervical adenopathy.  Neurological: He is alert and oriented to person, place, and time. He displays normal reflexes. No cranial nerve deficit.  Skin: No rash noted.  Psychiatric: He has a normal mood and affect.          Assessment & Plan:  Complete physical. Labs reviewed. Immunizations up to date. Confirm date of last colonoscopy. We have discussed the importance of losing some weight and establishing more consistent aerobic exercise.  Type 2 diabetes suboptimally controlled. Titrate metformin to 1000 mg twice a day. Recheck A1c in 4 months.

## 2014-06-16 ENCOUNTER — Other Ambulatory Visit: Payer: Self-pay | Admitting: Family Medicine

## 2014-07-15 ENCOUNTER — Other Ambulatory Visit: Payer: Self-pay | Admitting: Family Medicine

## 2014-07-16 ENCOUNTER — Ambulatory Visit: Payer: 59 | Admitting: Internal Medicine

## 2014-07-18 ENCOUNTER — Ambulatory Visit (INDEPENDENT_AMBULATORY_CARE_PROVIDER_SITE_OTHER): Payer: 59 | Admitting: Internal Medicine

## 2014-07-18 ENCOUNTER — Encounter: Payer: Self-pay | Admitting: Internal Medicine

## 2014-07-18 VITALS — BP 140/90 | HR 88 | Ht 74.0 in | Wt 270.4 lb

## 2014-07-18 DIAGNOSIS — E669 Obesity, unspecified: Secondary | ICD-10-CM

## 2014-07-18 DIAGNOSIS — I4891 Unspecified atrial fibrillation: Secondary | ICD-10-CM

## 2014-07-18 DIAGNOSIS — I2510 Atherosclerotic heart disease of native coronary artery without angina pectoris: Secondary | ICD-10-CM

## 2014-07-18 DIAGNOSIS — G4733 Obstructive sleep apnea (adult) (pediatric): Secondary | ICD-10-CM

## 2014-07-18 NOTE — Patient Instructions (Signed)
Your physician recommends that you schedule a follow-up appointment in: 3 months in the afib clinic with Ceasar Lund at the hospital

## 2014-07-18 NOTE — Progress Notes (Signed)
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 Patient was first diagnosed with atrial fibrillation 05/2012. He converted to NSR with IV diltiazem and reports no incidence of atrial fibrillation until this February.  He failed medical therapy with multaq and recently has been placed on tikosyn. He underwent catheter ablation 5/22 after continuing to have breakthrough afib  on Tikosyn. Last visit tikosyn was stopped and xarelto changed to eliquis due to nausea which is resolved. Today, he repots no afib. Currently is feeling well maintaining normal rhythm.  Is inactive and regular exercise encouraged.Using cpap on regular basis.  Past Medical History  Diagnosis Date  . HYPERLIPIDEMIA 11/01/2008  . Coronary artery disease   . Paroxysmal atrial fibrillation   . Internal hemorrhoids without mention of complication   . ED (erectile dysfunction)   . Obesity   . Anal fissure   . Paresthesia of both legs   . Smoker     QUIT 10/13/13  . Hx of echocardiogram     Echo (10/2013): Mild LVH, moderate focal basal hypertrophy of the septum, EF 50-55%, no RWMA  . Chronic bronchitis     "get it q spring and fall" (10/27/2013)  . OSA on CPAP   . DM type 2, uncontrolled, with neuropathy   . Atrial flutter    Past Surgical History  Procedure Laterality Date  . Tonsillectomy and adenoidectomy  1979  . Inguinal hernia repair Right 1963  . Coronary angioplasty with stent placement  1996    residual 50% LAD and RCA by cath in 2007  . Cardiac catheterization  2007  . Anal fissure repair  ~ 2010  . Coronary angioplasty with stent placement  1995  . Tee without cardioversion N/A 01/11/2014    Procedure: TRANSESOPHAGEAL ECHOCARDIOGRAM (TEE);  Surgeon: [name], MD;  Location: [facility];  Service: Cardiovascular;  Laterality: N/A;   . Ablation  01-12-2014    PVI and CTI by Dr [name]    Current Outpatient Prescriptions  Medication Sig Dispense Refill  . apixaban (ELIQUIS) 5 MG TABS tablet Take 1 tablet (5 mg total) by mouth 2 (two) times daily. 60 tablet 11  . atorvastatin (LIPITOR) 40 MG tablet Take 1 tablet (40 mg total) by mouth daily with supper. 30 tablet 11  . diltiazem (CARDIZEM CD) 240 MG 24 hr capsule Take 1 capsule (240 mg total) by mouth 2 (two) times daily. 180 capsule 3  . fish oil-omega-3 fatty acids 1000 MG capsule Take 1 g by mouth 2 (two) times daily.     . Fluticasone-Salmeterol (ADVAIR) 100-50 MCG/DOSE AEPB Inhale 1 puff into the lungs at bedtime.    [name] glucose blood (ONETOUCH VERIO) test strip Check one time daily. DX: 250.00 100 each 3  . metFORMIN (GLUCOPHAGE) 500 MG tablet Take 1 tablet by mouth 2 (two) times daily.    [name] DELICA LANCETS FINE MISC Check one time daily. DX: 250.00 100 each 3  . PROAIR HFA 108 (90 BASE) MCG/ACT inhaler INHALE 2 PUFFS INTO THE LUNGS EVERY 4 HOURS AS NEEDED FOR WHEEZING 8.5 g 0   No current facility-administered medications for this visit.    Allergies  Allergen Reactions  . Cephalosporins Anaphylaxis  . Penicillins Anaphylaxis  . Codeine Itching  . Gabapentin Other (See Comments)    "made me feel  drugged"    History   Social History  . Marital Status: Married    Spouse Name: N/A    Number of Children: N/A  . Years of Education: N/A   Occupational History  . Not on file.   Social History Main Topics  . Smoking status: Former Smoker -- 0.00 packs/day for 35 years    Types: Cigarettes  . Smokeless tobacco: Not on file  . Alcohol Use: Yes     Comment: 10/27/2013 "might have 1 beer/month"  . Drug Use: No  . Sexual Activity: Yes   Other Topics Concern  . 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 Hyperlipidemia Other   . Heart disease Other   . Heart  disease Mother 38    Arrhythmia  . Heart disease Father 84    died 80 CHF    ROS- All systems are reviewed and negative except as per the HPI above  Physical Exam: Filed Vitals:   07/18/14 1153  BP: 140/90  Pulse: 88  Height: 6\' 2"  (1.88 m)  Weight: 270 lb 6.4 oz (122.653 kg)    GEN- The patient is well appearing, alert and oriented x 3 today.   Head- normocephalic, atraumatic Eyes-  Sclera clear, conjunctiva pink Ears- hearing intact Oropharynx- clear Neck- supple, no JVP Lymph- no cervical lymphadenopathy Lungs- Clear to ausculation bilaterally, normal work of breathing Heart- regular rate and rhythm, no murmurs, rubs or gallops, PMI not laterally displaced GI- soft, NT, ND, + BS Extremities- no clubbing, cyanosis, or edema MS- no significant deformity or atrophy Skin- no rash or lesion Psych- euthymic mood, full affect Neuro- strength and sensation are intact  EKG- today reveals sinus rhythm,88 bpm, NSIV delay, QRS 120 ms, QTc 444ms.   Assessment and Plan:   1. Afib, S/p ablation 5/22 Maintaining sinus rhythm post ablation off of AAD therapy. Continue diltiazem. Chadsvasc score is 2.  I have recommended that he continue Eliquis.   2. OSA Compliance with cpap is encouraged  3. CAD Stable No change required today  4. Nausea Resolved with switch form xarelto to eliquis.  5. Obesity Regular exercise Body mass index is 34.7 kg/(m^2). Weight loss  F/u 3 months in afib clinic

## 2014-08-02 ENCOUNTER — Encounter (HOSPITAL_COMMUNITY): Payer: Self-pay | Admitting: Internal Medicine

## 2014-08-03 ENCOUNTER — Encounter: Payer: Self-pay | Admitting: Cardiology

## 2014-08-16 ENCOUNTER — Other Ambulatory Visit: Payer: Self-pay | Admitting: Family Medicine

## 2014-08-20 ENCOUNTER — Emergency Department (INDEPENDENT_AMBULATORY_CARE_PROVIDER_SITE_OTHER)
Admission: EM | Admit: 2014-08-20 | Discharge: 2014-08-20 | Disposition: A | Discharge status: Home / Self Care | Payer: 59 | Source: Home / Self Care | Attending: Emergency Medicine | Admitting: Emergency Medicine

## 2014-08-20 ENCOUNTER — Encounter (HOSPITAL_COMMUNITY): Payer: Self-pay

## 2014-08-20 ENCOUNTER — Emergency Department (INDEPENDENT_AMBULATORY_CARE_PROVIDER_SITE_OTHER): Payer: 59

## 2014-08-20 DIAGNOSIS — S92302A Fracture of unspecified metatarsal bone(s), left foot, initial encounter for closed fracture: Secondary | ICD-10-CM

## 2014-08-20 DIAGNOSIS — S99929A Unspecified injury of unspecified foot, initial encounter: Secondary | ICD-10-CM

## 2014-08-20 MED ORDER — HYDROCODONE-ACETAMINOPHEN 5-325 MG PO TABS: 1.0000 | ORAL_TABLET | ORAL | Status: DC | PRN

## 2014-08-20 NOTE — Discharge Instructions (Signed)
Metatarsal Fracture  No weight bearing for now Elevation crutches  A metatarsal fracture is a break (fracture) of one of the bones of the mid-foot (metatarsal bones). The metatarsal bones are responsible for maintaining the arch of the foot. There are three classifications of metatarsal fractures: dancer's fractures, Jones fractures, and stress fractures. A dancer's fracture is when a piece of bone is pulled off by a ligament or tendon (avulsion fracture) of the outer part of the foot (fifth metatarsal), near the joint with the ankle bones. A Jones fracture occurs in the middle of the fifth metatarsal. These fractures have limited ability to heal. A stress fracture occurs when the bone is slowly injured faster than it can repair itself. SYMPTOMS   Sharp pain, especially with standing or walking.  Tenderness, swelling, and later bruising (contusion) of the foot.  Numbness or paralysis from swelling in the foot, causing pressure on the blood vessels or nerves (uncommon). CAUSES  Fractures occur when a force is placed on the bone that is greater than it can handle. Common causes of injury include:  Direct hit (trauma) to the foot.  Twisting injury to the foot or ankle.  Landing on the foot and ankle in an improper position. RISK INCRESES WITH:  Participation in contact sports, sports that require jumping and landing, or sports in which cleats are worn and sliding occurs.  Previous foot or ankle sprains or dislocations.  Repeated injury to any joint in the foot.  Poor strength and flexibility. PREVENTION  Warm up and stretch properly before an activity.  Allow for adequate recovery between workouts.  Maintain physical fitness in:  Strength, flexibility, and endurance.  Cardiovascular fitness.  When participating in jumping or contact sports, protect joints with supportive devices, such as wrapped elastic bandages, tape, braces, or high-top athletic shoes.  Wear properly  fitted and padded protective equipment. PROGNOSIS If treated properly, metatarsal fractures usually heal well. Jones fractures have a higher risk of the bone failing to heal (nonunion). Sometimes, surgery is needed to heal Jones fractures.  RELATED COMPLICATIONS   Nonunion.  Fracture heals in a poor position (malunion).  Long-term (chronic) pain, stiffness, or swelling of the foot.  Excessive bleeding in the foot or at the dislocation site, causing pressure and injury to nerves and blood vessels (rare).  Unstable or arthritic joint following repeated injury or delayed treatment. TREATMENT  Treatment first involves the use of ice and medicine, to reduce pain and inflammation. If the bone fragments are out of alignment (displaced), then immediate realigning of the bones (reduction) is required. Fractures that cannot be realigned by hand, or where the bones protrude through the skin (open), may require surgery to hold the fracture in place with screws, pins, and plates. After the bones are in proper alignment, the foot and ankle must be restrained for 6 or more weeks. Restraint allows healing to occur. After restraint, it is important to perform strengthening and stretching exercises to help regain strength and a full range of motion. These exercises may be completed at home or with a therapist. A stiff-soled shoe and arch support (orthotic) may be required when first returning to sports. MEDICATION   If pain medicine is needed, nonsteroidal anti-inflammatory medicines (NSAIDS), or other minor pain relievers, are often advised.  Do not take pain medicine for 7 days before surgery.  Only take over-the-counter or prescription medicines for pain, fever, or discomfort as directed by your caregiver. COLD THERAPY  Cold treatment (icing) should be applied for 10  to 15 minutes every 2 to 3 hours for inflammations and pain, and immediately after activity that aggravates your symptoms. Use ice packs or  ice massage. SEEK MEDICAL CARE IF:  Pain, tenderness, or swelling gets worse, despite treatment.  You experience pain, numbness, or coldness in the foot.  Blue, gray, or dark color appears in the toenails.  You or your child has an oral temperature above 102 F (38.9 C).  You have increased pain, swelling, and redness.  You have drainage of fluids or bleeding in the affected area.  New, unexplained symptoms develop. (Drugs used in treatment may produce side effects.)

## 2014-08-20 NOTE — ED Notes (Addendum)
C/o missed last step when going down steps, injury to left foot. Poor dorsal pedal/posterior tibial pulses, but foot color good, warm to touch. Unable to auscultate dorsal pedal w doppler, but had good posterior tibial pulse

## 2014-08-20 NOTE — ED Provider Notes (Signed)
CSN: [id number]     Arrival date & time 08/20/14  0825 History   First MD Initiated Contact with Patient 08/20/14 [phone]     Chief Complaint  Patient presents with  . Foot Injury   (Consider location/radiation/quality/duration/timing/severity/associated sxs/prior Treatment) HPI Comments: 58 year old male descending stairs and believes he twisted his left foot. This occurred yesterday afternoon. He is complaining of pain to the lateral aspect of the foot. Denies pain or known injury to the ankle. Pain with weightbearing.   Past Medical History  Diagnosis Date  . HYPERLIPIDEMIA 11/01/2008  . Coronary artery disease   . Paroxysmal atrial fibrillation   . Internal hemorrhoids without mention of complication   . ED (erectile dysfunction)   . Obesity   . Anal fissure   . Paresthesia of both legs   . Smoker     QUIT 10/13/13  . Hx of echocardiogram     Echo (10/2013): Mild LVH, moderate focal basal hypertrophy of the septum, EF 50-55%, no RWMA  . Chronic bronchitis     "get it q spring and fall" (10/27/2013)  . OSA on CPAP   . DM type 2, uncontrolled, with neuropathy   . Atrial flutter    Past Surgical History  Procedure Laterality Date  . Tonsillectomy and adenoidectomy  1979  . Inguinal hernia repair Right 1963  . Coronary angioplasty with stent placement  1996    residual 50% LAD and RCA by cath in 2007  . Cardiac catheterization  2007  . Anal fissure repair  ~ 2010  . Coronary angioplasty with stent placement  1995  . Tee without cardioversion N/A 01/11/2014    Procedure: TRANSESOPHAGEAL ECHOCARDIOGRAM (TEE);  Surgeon: [name], MD;  Location: [facility];  Service: Cardiovascular;  Laterality: N/A;  . Ablation  01-12-2014    PVI and CTI by Dr [name]  . Atrial fibrillation ablation N/A 01/12/2014    Procedure: ATRIAL FIBRILLATION ABLATION;  Surgeon: Coralyn Mark, MD;  Location: [facility];  Service: Cardiovascular;  Laterality: N/A;   Family History  Problem Relation  Age of Onset  . Hyperlipidemia Other   . Heart disease Other   . Heart disease Mother 9    Arrhythmia  . Heart disease Father 33    died 69 CHF   History  Substance Use Topics  . Smoking status: Former Smoker -- 0.00 packs/day for 35 years    Types: Cigarettes  . Smokeless tobacco: Not on file  . Alcohol Use: Yes     Comment: 10/27/2013 "might have 1 beer/month"    Review of Systems  Constitutional: Positive for activity change.  Respiratory: Negative.   Genitourinary: Negative.   Musculoskeletal: Positive for gait problem. Negative for myalgias and back pain.       As per HPI  Skin: Negative.   Neurological: Negative for syncope and numbness.    Allergies  Cephalosporins; Penicillins; Codeine; and Gabapentin  Home Medications   Prior to Admission medications   Medication Sig Start Date End Date Taking? Authorizing Provider  apixaban (ELIQUIS) 5 MG TABS tablet Take 1 tablet (5 mg total) by mouth 2 (two) times daily. 04/11/14   Thompson Grayer, MD  atorvastatin (LIPITOR) 40 MG tablet Take 1 tablet (40 mg total) by mouth daily with supper. 01/29/14   [name], MD  diltiazem (CARDIZEM CD) 240 MG 24 hr capsule Take 1 capsule (240 mg total) by mouth 2 (two) times daily. 12/01/13   Thompson Grayer, MD  fish  oil-omega-3 fatty acids 1000 MG capsule Take 1 g by mouth 2 (two) times daily.     Historical Provider, MD  Fluticasone-Salmeterol (ADVAIR) 100-50 MCG/DOSE AEPB Inhale 1 puff into the lungs at bedtime.    Historical Provider, MD  glucose blood (ONETOUCH VERIO) test strip Check one time daily. DX: 250.00 05/07/14   [name], MD  HYDROcodone-acetaminophen (NORCO/VICODIN) 5-325 MG per tablet Take 1 tablet by mouth every 4 (four) hours as needed. 08/20/14   Janne Napoleon, NP  metFORMIN (GLUCOPHAGE) 500 MG tablet Take 1 tablet by mouth 2 (two) times daily. 07/15/14   Historical Provider, MD  Jonetta Speak LANCETS FINE MISC Check one time daily. DX: 250.00 05/07/14   [name], MD  PROAIR HFA 108 (90 BASE) MCG/ACT inhaler INHALE 2 PUFFS BY MOUTH INTO THE LUNGS EVERY 4 HOURS AS NEEDED FOR WHEEZING 08/18/14   [name], MD   BP 129/88 mmHg  Pulse 88  Temp(Src) 97.7 F (36.5 C) (Oral)  Resp 16  SpO2 95% Physical Exam  Constitutional: He is oriented to person, place, and time. He appears well-developed and well-nourished.  HENT:  Head: Normocephalic and atraumatic.  Eyes: EOM are normal.  Neck: Normal range of motion. Neck supple.  Pulmonary/Chest: Effort normal. No respiratory distress.  Musculoskeletal:  Bilateral lower extremities with purplish discoloration due to venous dysfunction. There is no obvious deformity of the left foot. The ankle is intact. Full range of motion of ankle with no bony tenderness or swelling. Foot without swelling or deformity. Tenderness to the lateral aspect along the fourth and fifth metacarpals. No plantar tenderness. Unable to palpate pulses in bilateral lower extremities/feet. Multiple varicosities are noted. Distal color and warmth and sensation is intact.  Neurological: He is alert and oriented to person, place, and time. No cranial nerve deficit.  Skin: Skin is warm and dry.  Psychiatric: He has a normal mood and affect.  Nursing note and vitals reviewed.   ED Course  Procedures (including critical care time) Labs Review Labs Reviewed - No data to display  Imaging Review Dg Foot Complete Left  08/20/2014   CLINICAL DATA:  Status post fall from a step yesterday with persistent lateral foot pain and inability to bear weight  EXAM: LEFT FOOT - COMPLETE 3+ VIEW  COMPARISON:  None.  FINDINGS: The bones of the foot are adequately mineralized. There is a mildly distracted transversely oriented fracture through the base of the fifth metatarsal. The remainder of the fifth metatarsal is intact. The other metatarsals also are intact. The phalanges, interphalangeal joints, and metatarsophalangeal joints are  unremarkable. The tarsometatarsal and intertarsal joints also are normal. There are small plantar and Achilles region calcaneal spurs.  IMPRESSION: There is an acute mildly distracted transversely oriented fracture through the base of the fifth metatarsal.   Electronically Signed   By: Azarias Chiou  [place]   On: 08/20/2014 08:55     MDM   1. Fracture of 5th metatarsal, left, closed, initial encounter   2. Foot injury    No weight bearing for now Elevation crutches ACE and post op shoe Norco 5 mg #15    Janne Napoleon, NP 08/20/14 [phone]

## 2014-08-21 ENCOUNTER — Encounter: Payer: Self-pay | Admitting: Cardiology

## 2014-08-24 DIAGNOSIS — D126 Benign neoplasm of colon, unspecified: Secondary | ICD-10-CM

## 2014-08-24 HISTORY — DX: Benign neoplasm of colon, unspecified: D12.6

## 2014-09-15 ENCOUNTER — Other Ambulatory Visit: Payer: Self-pay | Admitting: Family Medicine

## 2014-09-26 ENCOUNTER — Encounter: Payer: Self-pay | Admitting: Family Medicine

## 2014-09-26 ENCOUNTER — Ambulatory Visit (INDEPENDENT_AMBULATORY_CARE_PROVIDER_SITE_OTHER): Payer: 59 | Admitting: Family Medicine

## 2014-09-26 VITALS — BP 130/74 | HR 104 | Temp 98.4°F | Wt 267.0 lb

## 2014-09-26 DIAGNOSIS — R197 Diarrhea, unspecified: Secondary | ICD-10-CM

## 2014-09-26 DIAGNOSIS — R1011 Right upper quadrant pain: Secondary | ICD-10-CM

## 2014-09-26 NOTE — Progress Notes (Signed)
Subjective:    Patient ID: Eric Palmer, male    DOB: October 31, 1955, 59 y.o.   MRN: [id number]  HPI  Patient seen with abdominal pain mostly right upper quadrant intermittently for over one month. Radiates to the back occasionally. He describes as dull to achy pain. Somewhat progressive over this time.. Frequently has night pain. Occasionally correlates with eating but not consistently. Had some occasional nausea no vomiting. He's also noticed over the past month or so frequently following eating has loose to watery stools. Has recently noticed a couple of times blood. He's had previous colonoscopy estimate around age 83. No reported weight loss. No alleviating factors. No history of gallstones.  Past Medical History  Diagnosis Date  . HYPERLIPIDEMIA 11/01/2008  . Coronary artery disease   . Paroxysmal atrial fibrillation   . Internal hemorrhoids without mention of complication   . ED (erectile dysfunction)   . Obesity   . Anal fissure   . Paresthesia of both legs   . Smoker     QUIT 10/13/13  . Hx of echocardiogram     Echo (10/2013): Mild LVH, moderate focal basal hypertrophy of the septum, EF 50-55%, no RWMA  . Chronic bronchitis     "get it q spring and fall" (10/27/2013)  . OSA on CPAP   . DM type 2, uncontrolled, with neuropathy   . Atrial flutter    Past Surgical History  Procedure Laterality Date  . Tonsillectomy and adenoidectomy  1979  . Inguinal hernia repair Right 1963  . Coronary angioplasty with stent placement  1996    residual 50% LAD and RCA by cath in 2007  . Cardiac catheterization  2007  . Anal fissure repair  ~ 2010  . Coronary angioplasty with stent placement  1995  . Tee without cardioversion N/A 01/11/2014    Procedure: TRANSESOPHAGEAL ECHOCARDIOGRAM (TEE);  Surgeon: [name], MD;  Location: [facility];  Service: Cardiovascular;  Laterality: N/A;  . Ablation  01-12-2014    PVI and CTI by Dr [name]  . Atrial fibrillation ablation N/A 01/12/2014   Procedure: ATRIAL FIBRILLATION ABLATION;  Surgeon: Coralyn Mark, MD;  Location: [facility];  Service: Cardiovascular;  Laterality: N/A;    reports that he has quit smoking. His smoking use included Cigarettes. He smoked 0.00 packs per day for 35 years. He does not have any smokeless tobacco history on file. He reports that he drinks alcohol. He reports that he does not use illicit drugs. family history includes Heart disease in his other; Heart disease (age of onset: 40) in his father; Heart disease (age of onset: 3) in his mother; Hyperlipidemia in his other. Allergies  Allergen Reactions  . Cephalosporins Anaphylaxis  . Penicillins Anaphylaxis  . Codeine Itching  . Gabapentin Other (See Comments)    "made me feel drugged"     Review of Systems  Constitutional: Positive for appetite change. Negative for fever and chills.  HENT: Negative for trouble swallowing.   Respiratory: Negative for shortness of breath.   Cardiovascular: Negative for chest pain.  Gastrointestinal: Positive for nausea and abdominal pain. Negative for vomiting.  Genitourinary: Negative for dysuria.       Objective:   Physical Exam  Constitutional: He appears well-developed and well-nourished.  Neck: Neck supple.  Cardiovascular: Normal rate and regular rhythm.   Pulmonary/Chest: Effort normal and breath sounds normal. No respiratory distress. He has no wheezes. He has no rales.  Abdominal: Soft. Bowel sounds are normal. He exhibits  no distension and no mass. There is no rebound and no guarding.  Minimally tender right upper quadrant  Musculoskeletal: He exhibits no edema.  Lymphadenopathy:    He has no cervical adenopathy.          Assessment & Plan:  Intermittent abdominal pain -mostly right upper quadrant. Rule out symptomatic gallstones. He also describes some intermittent diarrhea. Check labs with CBC, hepatic panel, basic metabolic panel, lipase. Set up abdominal ultrasound. If all above normal  consider GI evaluation

## 2014-09-26 NOTE — Patient Instructions (Signed)

## 2014-09-26 NOTE — Progress Notes (Signed)
Pre visit review using our clinic review tool, if applicable. No additional management support is needed unless otherwise documented below in the visit note. 

## 2014-09-27 LAB — CBC WITH DIFFERENTIAL/PLATELET
Basophils Absolute: 0.1 10*3/uL (ref 0.0–0.1)
Basophils Relative: 1 % (ref 0.0–3.0)
Eosinophils Absolute: 0.4 10*3/uL (ref 0.0–0.7)
Eosinophils Relative: 4.3 % (ref 0.0–5.0)
HCT: 44.4 % (ref 39.0–52.0)
Hemoglobin: 15.2 g/dL (ref 13.0–17.0)
Lymphocytes Relative: 28.9 % (ref 12.0–46.0)
Lymphs Abs: 2.5 10*3/uL (ref 0.7–4.0)
MCHC: 34.2 g/dL (ref 30.0–36.0)
MCV: 86.4 fl (ref 78.0–100.0)
Monocytes Absolute: 0.8 10*3/uL (ref 0.1–1.0)
Monocytes Relative: 8.9 % (ref 3.0–12.0)
Neutro Abs: 5 10*3/uL (ref 1.4–7.7)
Neutrophils Relative %: 56.9 % (ref 43.0–77.0)
Platelets: 280 10*3/uL (ref 150.0–400.0)
RBC: 5.14 Mil/uL (ref 4.22–5.81)
RDW: 12.9 % (ref 11.5–15.5)
WBC: 8.8 10*3/uL (ref 4.0–10.5)

## 2014-09-27 LAB — BASIC METABOLIC PANEL
BUN: 12 mg/dL (ref 6–23)
CO2: 23 mEq/L (ref 19–32)
Calcium: 9.6 mg/dL (ref 8.4–10.5)
Chloride: 104 mEq/L (ref 96–112)
Creatinine, Ser: 1.14 mg/dL (ref 0.40–1.50)
GFR: 70.19 mL/min (ref >60.00)
Glucose, Bld: 125 mg/dL — ABNORMAL HIGH (ref 70–99)
Potassium: 4.2 mEq/L (ref 3.5–5.1)
Sodium: 138 mEq/L (ref 135–145)

## 2014-09-27 LAB — HEPATIC FUNCTION PANEL
ALT: 42 U/L (ref 0–53)
AST: 33 U/L (ref 0–37)
Albumin: 4.3 g/dL (ref 3.5–5.2)
Alkaline Phosphatase: 75 U/L (ref 39–117)
Bilirubin, Direct: 0.1 mg/dL (ref 0.0–0.3)
Total Bilirubin: 0.6 mg/dL (ref 0.2–1.2)
Total Protein: 6.8 g/dL (ref 6.0–8.3)

## 2014-09-27 LAB — LIPASE: Lipase: 45 U/L (ref 11.0–59.0)

## 2014-10-01 ENCOUNTER — Telehealth: Payer: Self-pay | Admitting: Family Medicine

## 2014-10-01 ENCOUNTER — Other Ambulatory Visit: Payer: Self-pay | Admitting: Family Medicine

## 2014-10-01 ENCOUNTER — Ambulatory Visit
Admission: RE | Admit: 2014-10-01 | Discharge: 2014-10-01 | Disposition: A | Discharge status: Home / Self Care | Payer: 59 | Source: Ambulatory Visit | Attending: Family Medicine | Admitting: Family Medicine

## 2014-10-01 DIAGNOSIS — R1011 Right upper quadrant pain: Secondary | ICD-10-CM

## 2014-10-01 DIAGNOSIS — R109 Unspecified abdominal pain: Secondary | ICD-10-CM

## 2014-10-01 NOTE — Telephone Encounter (Signed)
Pt had sonogram this am and liver profile last week. Calling for results. pls cb.

## 2014-10-01 NOTE — Telephone Encounter (Signed)
Pt informed

## 2014-10-02 ENCOUNTER — Telehealth: Payer: Self-pay | Admitting: Family Medicine

## 2014-10-02 NOTE — Telephone Encounter (Signed)
Pt would like if you could change his referral to [facility] to STAT.  Pt states he has had ongoing diarrhea and feels like he needs to be checked out asap. Cannot go on like this.

## 2014-10-03 ENCOUNTER — Other Ambulatory Visit (INDEPENDENT_AMBULATORY_CARE_PROVIDER_SITE_OTHER): Payer: 59

## 2014-10-03 DIAGNOSIS — E1165 Type 2 diabetes mellitus with hyperglycemia: Secondary | ICD-10-CM

## 2014-10-03 DIAGNOSIS — IMO0002 Reserved for concepts with insufficient information to code with codable children: Secondary | ICD-10-CM

## 2014-10-03 LAB — HEMOGLOBIN A1C: Hgb A1c MFr Bld: 8 % — ABNORMAL HIGH (ref 4.6–6.5)

## 2014-10-03 NOTE — Telephone Encounter (Signed)
Spoke with patient. Patient has appointment 10/04/14

## 2014-10-03 NOTE — Telephone Encounter (Signed)
Called pt at work number and left message for patient to return call. GI number [phone]

## 2014-10-04 ENCOUNTER — Other Ambulatory Visit: Payer: Self-pay | Admitting: Family Medicine

## 2014-10-04 ENCOUNTER — Other Ambulatory Visit: Payer: Self-pay

## 2014-10-04 ENCOUNTER — Ambulatory Visit (INDEPENDENT_AMBULATORY_CARE_PROVIDER_SITE_OTHER): Payer: 59 | Admitting: Gastroenterology

## 2014-10-04 ENCOUNTER — Encounter: Payer: Self-pay | Admitting: Gastroenterology

## 2014-10-04 ENCOUNTER — Telehealth: Payer: Self-pay | Admitting: *Deleted

## 2014-10-04 VITALS — BP 114/68 | HR 72 | Ht 74.0 in | Wt 264.2 lb

## 2014-10-04 DIAGNOSIS — R1011 Right upper quadrant pain: Secondary | ICD-10-CM | POA: Insufficient documentation

## 2014-10-04 DIAGNOSIS — R110 Nausea: Secondary | ICD-10-CM | POA: Insufficient documentation

## 2014-10-04 DIAGNOSIS — R197 Diarrhea, unspecified: Secondary | ICD-10-CM | POA: Insufficient documentation

## 2014-10-04 DIAGNOSIS — E118 Type 2 diabetes mellitus with unspecified complications: Secondary | ICD-10-CM

## 2014-10-04 DIAGNOSIS — K625 Hemorrhage of anus and rectum: Secondary | ICD-10-CM | POA: Insufficient documentation

## 2014-10-04 MED ORDER — METFORMIN HCL 500 MG PO TABS: 1000.0000 mg | ORAL_TABLET | Freq: Two times a day (BID) | ORAL | Status: DC

## 2014-10-04 MED ORDER — MOVIPREP 100 G PO SOLR: 1.0000 | ORAL | Status: DC

## 2014-10-04 NOTE — Telephone Encounter (Signed)
  10/04/2014   RE: Eric Palmer DOB: Jan 28, 1956 MRN: [id number]   Dear Dr. Daneen Schick,    We have scheduled the above patient for an endoscopic procedure, Colonoscopy. Our records show that he is on anticoagulation therapy.   Please advise as to how long the patient may come off his therapy of Eliquis prior to the procedure, which is scheduled for 10-29-2014.  Please fax back/ or route the completed form to [facility] at [phone].   Sincerely,    Alonza Bogus PA-C

## 2014-10-04 NOTE — Progress Notes (Signed)
10/04/2014 [facility] [id number] 1956-08-22   HISTORY OF PRESENT ILLNESS:  Is is a 59 year old male who is new to our practice. He was previously seen at [facility] in 2008 for a colonoscopy.  We have a pathology report showing that he had hyperplastic colon polyps removed, but have requested the actual endoscopy report from [facility] and are awaiting his results.  He has been referred to [place] by his PCP, Dr. Elease Hashimoto, to discuss the issues below.  He presents to our office today with complaints of right upper quadrant abdominal pain, nausea, and diarrhea. He says that all of his symptoms began about a month and a half ago and have gotten worse. The pain on his right side radiates around to the right side of his back under his rib cage. Described as a gnawing pain when present, but is not present constantly.  The pain is not necessarily worsened when eating.  He states that the nausea is usually worse in the morning and he often wakes up with it. He says that the diarrhea seems to be every time he eats any decent type of meal. There is urgency with that as well. He also admits that he has seen some small burgundy colored clots with his stool on a few occasions over the past month a half as well. He says that he is having about 2-3 loose bowel movements a every day. He denies any real previous GI issues in the past but says that he had issues with diarrhea previously when increasing his dose of metformin. He denies any recent antibiotic use or travel.  He had an ultrasound of the abdomen performed on February 8, which showed no acute or other intra-abdominal abnormalities.  A CBC, BMP, hepatic function panel, and lipase were within normal limits. TSH back in September 2015 was normal.  He is on Eliquis for atrial fibrillation and follows with Dr. Tamala Julian for cardiology.  Past Medical History  Diagnosis Date  . HYPERLIPIDEMIA 11/01/2008  . Coronary artery disease   . Paroxysmal atrial fibrillation    . Internal hemorrhoids without mention of complication   . ED (erectile dysfunction)   . Obesity   . Anal fissure   . Paresthesia of both legs   . Smoker     QUIT 10/13/13  . Hx of echocardiogram     Echo (10/2013): Mild LVH, moderate focal basal hypertrophy of the septum, EF 50-55%, no RWMA  . Chronic bronchitis     "get it q spring and fall" (10/27/2013)  . OSA on CPAP   . DM type 2, uncontrolled, with neuropathy   . Atrial flutter    Past Surgical History  Procedure Laterality Date  . Tonsillectomy and adenoidectomy  1979  . Inguinal hernia repair Right 1963  . Coronary angioplasty with stent placement  1996    residual 50% LAD and RCA by cath in 2007  . Cardiac catheterization  2007  . Anal fissure repair  ~ 2010  . Coronary angioplasty with stent placement  1995  . Tee without cardioversion N/A 01/11/2014    Procedure: TRANSESOPHAGEAL ECHOCARDIOGRAM (TEE);  Surgeon: [name], MD;  Location: [facility];  Service: Cardiovascular;  Laterality: N/A;  . Ablation  01-12-2014    PVI and CTI by Dr [name]  . Atrial fibrillation ablation N/A 01/12/2014    Procedure: ATRIAL FIBRILLATION ABLATION;  Surgeon: Coralyn Mark, MD;  Location: [facility];  Service: Cardiovascular;  Laterality: N/A;  reports that he has quit smoking. His smoking use included Cigarettes. He smoked 0.00 packs per day for 35 years. He does not have any smokeless tobacco history on file. He reports that he drinks alcohol. He reports that he does not use illicit drugs. family history includes Heart disease in his other; Heart disease (age of onset: 86) in his father; Heart disease (age of onset: 64) in his mother; Hyperlipidemia in his other. Allergies  Allergen Reactions  . Cephalosporins Anaphylaxis  . Penicillins Anaphylaxis  . Codeine Itching  . Gabapentin Other (See Comments)    "made me feel drugged"      Outpatient Encounter Prescriptions as of 10/04/2014  Medication Sig  . apixaban (ELIQUIS) 5  MG TABS tablet Take 1 tablet (5 mg total) by mouth 2 (two) times daily.  [name] atorvastatin (LIPITOR) 40 MG tablet Take 1 tablet (40 mg total) by mouth daily with supper.  . diltiazem (CARDIZEM CD) 240 MG 24 hr capsule Take 1 capsule (240 mg total) by mouth 2 (two) times daily.  . fish oil-omega-3 fatty acids 1000 MG capsule Take 1 g by mouth 2 (two) times daily.   . Fluticasone-Salmeterol (ADVAIR) 100-50 MCG/DOSE AEPB Inhale 1 puff into the lungs at bedtime.  [name] glucose blood (ONETOUCH VERIO) test strip Check one time daily. DX: 250.00  . ONETOUCH DELICA LANCETS FINE MISC Check one time daily. DX: 250.00  . PROAIR HFA 108 (90 BASE) MCG/ACT inhaler INHALE 2 PUFFS BY MOUTH INTO THE LUNGS EVERY 4 HOURS AS NEEDED FOR WHEEZING  . [DISCONTINUED] metFORMIN (GLUCOPHAGE) 500 MG tablet TAKE 1 TABLET BY MOUTH TWICE DAILY WITH MEALS (Patient taking differently: TAKE 2 TABLET BY MOUTH TWICE DAILY WITH MEALS)  . MOVIPREP 100 G SOLR Take 1 kit (200 g total) by mouth as directed.     REVIEW OF SYSTEMS  : All other systems reviewed and negative except where noted in the History of Present Illness.   PHYSICAL EXAM: BP 114/68 mmHg  Pulse 72  Ht 6\' 2"  (1.88 m)  Wt 264 lb 3.2 oz (119.84 kg)  BMI 33.91 kg/m2 General: Well developed white male in no acute distress Head: Normocephalic and atraumatic Eyes:  Sclerae anicteric,conjunctive pink. Ears: Normal auditory acuity  Lungs: Clear throughout to auscultation Heart: Regular rate and rhythm Abdomen: Soft, non-distended.  Normal bowel sounds.  Non-tender. Musculoskeletal: Symmetrical with no gross deformities  Skin: No lesions on visible extremities Extremities: No edema  Neurological: Alert oriented x 4, grossly non-focal Psychological:  Alert and cooperative. Normal mood and affect  ASSESSMENT AND PLAN: -59 year old male with complaints of RUQ abdominal pain, nausea, diarrhea for the past 1.5 months:  There is really no reason to think that this infectious.   The RUQ abdominal pain and nausea sound like they could be gallbladder related, but LFT's and ultrasound are unremarkable.  We will check a HIDA scan with CCK.  ? If the diarrhea is related to his Metformin, but that still does not explain the nausea and RUQ abdominal pain. -Rectal bleeding:  Reports a few episodes of bleeding with some small clots as well.  Last colonoscopy was 7-8 years ago.  Will schedule colonoscopy for further evaluation with Dr. 59.  The risks, benefits, and alternatives were discussed with the patient and he consents to proceed.  -Chronic anticoagulation:  Is on Eliquis for atrial fibrillation.  The risks benefits and alternatives to a temporary hold of anti-coagulants/anti-platelets for the procedure were discussed with the patient he consents  to proceed. Obtain clearance from Dr. Tamala Julian for ok to hold Eliquis for 2 days prior to procedure.  CC:  Dr. Sheldon Silvan

## 2014-10-04 NOTE — Patient Instructions (Addendum)
We will call you once we hear back from Dr. Daneen Schick regarding the Eliquis medication.   You have been scheduled for a colonoscopy. Please follow written instructions given to you at your visit today.  Please pick up your prep kit at the pharmacy within the next 1-3 days. If you use inhalers (even only as needed), please bring them with you on the day of your procedure. Your physician has requested that you go to www.startemmi.com and enter the access code given to you at your visit today. This web site gives a general overview about your procedure. However, you should still follow specific instructions given to you by our office regarding your preparation for the procedure.   You have been scheduled for a HIDA scan at [facility] (1st floor) on 10-19-2014. Please arrive at  prior to your scheduled appointment at  9:52 am. Make certain not to have anything to eat or drink after midnight  prior to your test. Should this appointment date or time not work well for you, please call radiology scheduling at [phone].  _____________________________________________________________________ hepatobiliary (HIDA) scan is an imaging procedure used to diagnose problems in the liver, gallbladder and bile ducts. In the HIDA scan, a radioactive chemical or tracer is injected into a vein in your arm. The tracer is handled by the liver like bile. Bile is a fluid produced and excreted by your liver that helps your digestive system break down fats in the foods you eat. Bile is stored in your gallbladder and the gallbladder releases the bile when you eat a meal. A special nuclear medicine scanner (gamma camera) tracks the flow of the tracer from your liver into your gallbladder and small intestine.  During your HIDA scan  You'll be asked to change into a hospital gown before your HIDA scan begins. Your health care team will position you on a table, usually on your back. The radioactive tracer is then injected  into a vein in your arm.The tracer travels through your bloodstream to your liver, where it's taken up by the bile-producing cells. The radioactive tracer travels with the bile from your liver into your gallbladder and through your bile ducts to your small intestine.You may feel some pressure while the radioactive tracer is injected into your vein. As you lie on the table, a special gamma camera is positioned over your abdomen taking pictures of the tracer as it moves through your body. The gamma camera takes pictures continually for about an hour. You'll need to keep still during the HIDA scan. This can become uncomfortable, but you may find that you can lessen the discomfort by taking deep breaths and thinking about other things. Tell your health care team if you're uncomfortable. The radiologist will watch on a computer the progress of the radioactive tracer through your body. The HIDA scan may be stopped when the radioactive tracer is seen in the gallbladder and enters your small intestine. This typically takes about an hour. In some cases extra imaging will be performed if original images aren't satisfactory, if morphine is given to help visualize the gallbladder or if the medication CCK is given to look at the contraction of the gallbladder. This test typically takes 2 hours to complete. ________________________________________________________________________

## 2014-10-04 NOTE — Progress Notes (Signed)
Reviewed and agree with management plan.  Siddalee Vanderheiden T. Jakera Beaupre, MD FACG 

## 2014-10-04 NOTE — Telephone Encounter (Signed)
Eliquis should be discontinued 48 hours prior to colonoscopy. The medication can be resumed when felt safe by the gastroenterologist.

## 2014-10-09 NOTE — Telephone Encounter (Signed)
I called the patient to advise he is to be off the Eliquis 48 hours prior to procedure date.  He rescheduled the procedure to 12-17-2014.  He is to be off the Eliquis 4-23,24, and 25.  He resume the next day if the GI MD says he can per Dr. Daneen Schick. Patient verbalized understanding the instructions.

## 2014-10-12 ENCOUNTER — Ambulatory Visit (INDEPENDENT_AMBULATORY_CARE_PROVIDER_SITE_OTHER): Payer: 59 | Admitting: Cardiology

## 2014-10-12 ENCOUNTER — Encounter: Payer: Self-pay | Admitting: Cardiology

## 2014-10-12 VITALS — BP 120/80 | HR 86 | Ht 74.0 in | Wt 261.8 lb

## 2014-10-12 DIAGNOSIS — G4733 Obstructive sleep apnea (adult) (pediatric): Secondary | ICD-10-CM

## 2014-10-12 DIAGNOSIS — E669 Obesity, unspecified: Secondary | ICD-10-CM

## 2014-10-12 NOTE — Patient Instructions (Signed)
Your physician wants you to follow-up in: 6 months with Dr. Radford Pax. You will receive a reminder letter in the mail two months in advance. If you don't receive a letter, please call our office to schedule the follow-up appointment.

## 2014-10-12 NOTE — Progress Notes (Signed)
Cardiology Office Note   Date:  10/12/2014   ID:  Eric Palmer, DOB 08/22/1956, MRN [id number]  PCP:  [name], MD  Cardiologist:    Daneen Schick, MD  No chief complaint on file.     History of Present Illness: Eric Palmer is a 59 y.o. male with a history of ASCAD, PAF, dyslipidemia and a history of OSA who is here for followup of OSA. He was diagnosed with OSA years ago and has been on CPAP. He uses a full face mask which he tolerated well. He is doing well with his CPAP therapy.  He feels rested when he gets up in the am. He does not feel sleepy during the day. He occasionally feels like the pressure is too high when he wakes up in the middle of the night.  He does not think that he snores.  He has not been able to exercise much lately due to breaking a bone in his foot.   Past Medical History  Diagnosis Date  . HYPERLIPIDEMIA 11/01/2008  . Coronary artery disease   . Paroxysmal atrial fibrillation   . Internal hemorrhoids without mention of complication   . ED (erectile dysfunction)   . Obesity   . Anal fissure   . Paresthesia of both legs   . Smoker     QUIT 10/13/13  . Hx of echocardiogram     Echo (10/2013): Mild LVH, moderate focal basal hypertrophy of the septum, EF 50-55%, no RWMA  . Chronic bronchitis     "get it q spring and fall" (10/27/2013)  . OSA on CPAP   . DM type 2, uncontrolled, with neuropathy   . Atrial flutter     Past Surgical History  Procedure Laterality Date  . Tonsillectomy and adenoidectomy  1979  . Inguinal hernia repair Right 1963  . Coronary angioplasty with stent placement  1996    residual 50% LAD and RCA by cath in 2007  . Cardiac catheterization  2007  . Anal fissure repair  ~ 2010  . Coronary angioplasty with stent placement  1995  . Tee without cardioversion N/A 01/11/2014    Procedure: TRANSESOPHAGEAL ECHOCARDIOGRAM (TEE);  Surgeon: [name], MD;  Location: [facility];  Service: Cardiovascular;   Laterality: N/A;  . Ablation  01-12-2014    PVI and CTI by Dr [name]  . Atrial fibrillation ablation N/A 01/12/2014    Procedure: ATRIAL FIBRILLATION ABLATION;  Surgeon: Coralyn Mark, MD;  Location: [facility] CATH LAB;  Service: Cardiovascular;  Laterality: N/A;     Current Outpatient Prescriptions  Medication Sig Dispense Refill  . apixaban (ELIQUIS) 5 MG TABS tablet Take 1 tablet (5 mg total) by mouth 2 (two) times daily. 60 tablet 11  . atorvastatin (LIPITOR) 40 MG tablet Take 1 tablet (40 mg total) by mouth daily with supper. 30 tablet 11  . diltiazem (CARDIZEM CD) 240 MG 24 hr capsule Take 1 capsule (240 mg total) by mouth 2 (two) times daily. 180 capsule 3  . fish oil-omega-3 fatty acids 1000 MG capsule Take 1 g by mouth 2 (two) times daily.     . Fluticasone-Salmeterol (ADVAIR) 100-50 MCG/DOSE AEPB Inhale 1 puff into the lungs at bedtime.    [name] glucose blood (ONETOUCH VERIO) test strip Check one time daily. DX: 250.00 100 each 3  . metFORMIN (GLUCOPHAGE) 500 MG tablet Take 2 tablets (1,000 mg total) by mouth 2 (two) times daily with a meal. (Patient taking differently: Take 500  mg by mouth 2 (two) times daily with a meal. Pt states that he takes 1 tablet by mouth twice daily.) 180 tablet 3  . MOVIPREP 100 G SOLR Take 1 kit (200 g total) by mouth as directed. 1 kit 0  . ONETOUCH DELICA LANCETS FINE MISC Check one time daily. DX: 250.00 100 each 3  . PROAIR HFA 108 (90 BASE) MCG/ACT inhaler INHALE 2 PUFFS BY MOUTH INTO THE LUNGS EVERY 4 HOURS AS NEEDED FOR WHEEZING 8.5 g 1   No current facility-administered medications for this visit.    Allergies:   Cephalosporins; Penicillins; Codeine; and Gabapentin    Social History:  The patient  reports that he has quit smoking. His smoking use included Cigarettes. He smoked 0.00 packs per day for 35 years. He does not have any smokeless tobacco history on file. He reports that he drinks alcohol. He reports that he does not use illicit drugs.   Family  History:  The patient's family history includes Heart disease in his other; Heart disease (age of onset: 40) in his father; Heart disease (age of onset: 17) in his mother; Hyperlipidemia in his other.    ROS:  Please see the history of present illness.   Otherwise, review of systems are positive for none.   All other systems are reviewed and negative.    PHYSICAL EXAM: VS:  BP 120/80 mmHg  Pulse 86  Ht '6\' 2"'  (1.88 m)  Wt 261 lb 12.8 oz (118.752 kg)  BMI 33.60 kg/m2  SpO2 95% , BMI Body mass index is 33.6 kg/(m^2). GEN: Well nourished, well developed, in no acute distress HEENT: normal Neck: no JVD, carotid bruits, or masses Cardiac: RRR; no murmurs, rubs, or gallops,no edema  Respiratory:  clear to auscultation bilaterally, normal work of breathing GI: soft, nontender, nondistended, + BS MS: no deformity or atrophy Skin: warm and dry, no rash Neuro:  Strength and sensation are intact Psych: euthymic mood, full affect   EKG:  EKG is not ordered today.    Recent Labs: 02/12/2014: Magnesium 1.7 05/23/2014: TSH 1.87 09/26/2014: ALT 42; BUN 12; Creatinine 1.14; Hemoglobin 15.2; Platelets 280.0; Potassium 4.2; Sodium 138    Lipid Panel    Component Value Date/Time   CHOL 115 05/23/2014 0809   TRIG 256.0* 05/23/2014 0809   HDL 32.50* 05/23/2014 0809   CHOLHDL 4 05/23/2014 0809   VLDL 51.2* 05/23/2014 0809   LDLCALC 46 01/27/2013 0837   LDLDIRECT 44.4 05/23/2014 0809      Wt Readings from Last 3 Encounters:  10/12/14 261 lb 12.8 oz (118.752 kg)  10/04/14 264 lb 3.2 oz (119.84 kg)  09/26/14 267 lb (121.11 kg)    ASSESSMENT AND PLAN:  1. OSA on BiPAP and appears to be tolerating it well. I will keep him on his current settings His d/l today showed an AHI of 3.7/hr on BiPAP at 14/8cm H2O.  His is 97% compliant in using more than than 4 hours nightly. Since he is complaining of the pressure being too high, I have recommended that we decrease his BiPAP to 12/8cm H2o and  recheck a d/l in 2 weeks.   2. Obesity   Current medicines are reviewed at length with the patient today.  The patient does not have concerns regarding medicines.  The following changes have been made:  no change    Disposition:   FU with me in 6 months   Signed, Sueanne Margarita, MD  10/12/2014 3:39 PM    [facility] [place], [place], [place]  [address] Phone: [phone]; Fax: [phone]

## 2014-10-17 ENCOUNTER — Encounter: Payer: Self-pay | Admitting: Gastroenterology

## 2014-10-19 ENCOUNTER — Ambulatory Visit (HOSPITAL_COMMUNITY)
Admission: RE | Admit: 2014-10-19 | Discharge: 2014-10-19 | Disposition: A | Discharge status: Home / Self Care | Payer: 59 | Source: Ambulatory Visit | Attending: Gastroenterology | Admitting: Gastroenterology

## 2014-10-19 DIAGNOSIS — R110 Nausea: Secondary | ICD-10-CM | POA: Diagnosis not present

## 2014-10-19 DIAGNOSIS — R1011 Right upper quadrant pain: Secondary | ICD-10-CM | POA: Insufficient documentation

## 2014-10-19 DIAGNOSIS — R197 Diarrhea, unspecified: Secondary | ICD-10-CM

## 2014-10-19 DIAGNOSIS — K625 Hemorrhage of anus and rectum: Secondary | ICD-10-CM

## 2014-10-19 MED ORDER — TECHNETIUM TC 99M MEBROFENIN IV KIT: 5.3000 | PACK | Freq: Once | INTRAVENOUS | Status: AC | PRN

## 2014-10-25 ENCOUNTER — Other Ambulatory Visit: Payer: Self-pay | Admitting: Family Medicine

## 2014-10-29 ENCOUNTER — Encounter: Payer: 59 | Admitting: Gastroenterology

## 2014-12-06 ENCOUNTER — Other Ambulatory Visit: Payer: Self-pay | Admitting: Internal Medicine

## 2014-12-17 ENCOUNTER — Ambulatory Visit (AMBULATORY_SURGERY_CENTER): Payer: 59 | Admitting: Gastroenterology

## 2014-12-17 ENCOUNTER — Encounter: Payer: Self-pay | Admitting: Gastroenterology

## 2014-12-17 VITALS — BP 108/75 | HR 69 | Temp 96.7°F | Resp 16 | Ht 74.0 in | Wt 264.0 lb

## 2014-12-17 DIAGNOSIS — D123 Benign neoplasm of transverse colon: Secondary | ICD-10-CM

## 2014-12-17 DIAGNOSIS — K921 Melena: Secondary | ICD-10-CM | POA: Diagnosis present

## 2014-12-17 DIAGNOSIS — R197 Diarrhea, unspecified: Secondary | ICD-10-CM

## 2014-12-17 LAB — GLUCOSE, CAPILLARY
Glucose-Capillary: 151 mg/dL — ABNORMAL HIGH (ref 70–99)
Glucose-Capillary: 141 mg/dL — ABNORMAL HIGH (ref 70–99)

## 2014-12-17 MED ORDER — SODIUM CHLORIDE 0.9 % IV SOLN: 500.0000 mL | INTRAVENOUS | Status: DC

## 2014-12-17 NOTE — Progress Notes (Signed)
A/ox3, pleased with MAC, report to RN 

## 2014-12-17 NOTE — Progress Notes (Signed)
Called to room to assist during endoscopic procedure.  Patient ID and intended procedure confirmed with present staff. Received instructions for my participation in the procedure from the performing physician.  

## 2014-12-17 NOTE — Patient Instructions (Addendum)
AVOID ASPIRIN, ASPIRIN PRODUCTS AND NSAIDS ( MOTRIN, ALEVE, ADVIL, IBUPROFEN ETC) FOR TWO WEEKS, MAY 9,2016.  MAY RESUME YOUR ELIQUIS IN TWO DAYS.    YOU HAD AN ENDOSCOPIC PROCEDURE TODAY AT [facility]:   Refer to the procedure report that was given to you for any specific questions about what was found during the examination.  If the procedure report does not answer your questions, please call your gastroenterologist to clarify.  If you requested that your care partner not be given the details of your procedure findings, then the procedure report has been included in a sealed envelope for you to review at your convenience later.  YOU SHOULD EXPECT: Some feelings of bloating in the abdomen. Passage of more gas than usual.  Walking can help get rid of the air that was put into your GI tract during the procedure and reduce the bloating. If you had a lower endoscopy (such as a colonoscopy or flexible sigmoidoscopy) you may notice spotting of blood in your stool or on the toilet paper. If you underwent a bowel prep for your procedure, you may not have a normal bowel movement for a few days.  Please Note:  You might notice some irritation and congestion in your nose or some drainage.  This is from the oxygen used during your procedure.  There is no need for concern and it should clear up in a day or so.  SYMPTOMS TO REPORT IMMEDIATELY:   Following lower endoscopy (colonoscopy or flexible sigmoidoscopy):  Excessive amounts of blood in the stool  Significant tenderness or worsening of abdominal pains  Swelling of the abdomen that is new, acute  Fever of 100F or higher   For urgent or emergent issues, a gastroenterologist can be reached at any hour by calling [phone].   DIET: Your first meal following the procedure should be a small meal and then it is ok to progress to your normal diet. Heavy or fried foods are harder to digest and may make you feel nauseous or bloated.   Likewise, meals heavy in dairy and vegetables can increase bloating.  Drink plenty of fluids but you should avoid alcoholic beverages for 24 hours.  ACTIVITY:  You should plan to take it easy for the rest of today and you should NOT DRIVE or use heavy machinery until tomorrow (because of the sedation medicines used during the test).    FOLLOW UP: Our staff will call the number listed on your records the next business day following your procedure to check on you and address any questions or concerns that you may have regarding the information given to you following your procedure. If we do not reach you, we will leave a message.  However, if you are feeling well and you are not experiencing any problems, there is no need to return our call.  We will assume that you have returned to your regular daily activities without incident.  If any biopsies were taken you will be contacted by phone or by letter within the next 1-3 weeks.  Please call us at [phone] if you have not heard about the biopsies in 3 weeks.    SIGNATURES/CONFIDENTIALITY: You and/or your care partner have signed paperwork which will be entered into your electronic medical record.  These signatures attest to the fact that that the information above on your After Visit Summary has been reviewed and is understood.  Full responsibility of the confidentiality of this discharge information lies with you  and/or your care-partner. 

## 2014-12-17 NOTE — Op Note (Signed)
[facility]  [address] [place], [id number]   COLONOSCOPY PROCEDURE REPORT  PATIENT: Eric Palmer, Eric Palmer  MR#: [id number] BIRTHDATE: 12/17/1955 , 89  yrs. old GENDER: male ENDOSCOPIST: Ladene Artist, MD, [facility] REFERRED UE:[facility] Elease Hashimoto, M.D. PROCEDURE DATE:  12/17/2014 PROCEDURE:   Colonoscopy, diagnostic and Colonoscopy with snare polypectomy First Screening Colonoscopy - Avg.  risk and is 50 yrs.  old or older - No.  Prior Negative Screening - Now for repeat screening. N/A  History of Adenoma - Now for follow-up colonoscopy & has been > or = to 3 yrs.  N/A ASA CLASS:   Class III INDICATIONS:Evaluation of unexplained GI bleeding and Colorectal Neoplasm Risk Assessment for this procedure is average risk. MEDICATIONS: Monitored anesthesia care and Propofol 350 mg IV DESCRIPTION OF PROCEDURE:   After the risks benefits and alternatives of the procedure were thoroughly explained, informed consent was obtained.  The digital rectal exam revealed no abnormalities of the rectum.   The LB [id number]  endoscope was introduced through the anus and advanced to the cecum, which was identified by both the appendix and ileocecal valve. No adverse events experienced.   The quality of the prep was good.  (Suprep was used)  The instrument was then slowly withdrawn as the colon was fully examined.  COLON FINDINGS: A sessile polyp measuring 6 mm in size was found in the transverse colon.  A polypectomy was performed with a cold snare. The resection was complete, the polyp tissue was completely retrieved and sent to histology. There was moderate diverticulosis noted in the sigmoid colon and ascending colon.  The examination was otherwise normal.  Retroflexed views revealed internal Grade I hemorrhoids. The time to cecum = 6.6 Withdrawal time = 9.5   The scope was withdrawn and the procedure completed. COMPLICATIONS: There were no immediate complications.  ENDOSCOPIC  IMPRESSION: 1.   Sessile polyp in the transverse colon; polypectomy performed with a cold snare 2.   Moderate diverticulosis in the sigmoid colon and ascending colon 3.   Grade l internal hemorrhoids  RECOMMENDATIONS: 1.  Hold Aspirin and all other NSAIDS for 2 weeks. 2.  Await pathology results 3.  High fiber diet with liberal fluid intake. 4.  Resume Eliquis in 2 days 5.  Repeat colonoscopy in 5 years if polyp adenomatous; otherwise 10 years  eSigned:  Ladene Artist, MD, [facility] 12/17/2014 9:52 AM

## 2014-12-18 ENCOUNTER — Telehealth: Payer: Self-pay | Admitting: *Deleted

## 2014-12-18 NOTE — Telephone Encounter (Signed)
Attempted  To contact at [phone], no message left.

## 2014-12-27 ENCOUNTER — Encounter: Payer: Self-pay | Admitting: Gastroenterology

## 2015-01-07 ENCOUNTER — Other Ambulatory Visit: Payer: Self-pay | Admitting: Family Medicine

## 2015-01-17 ENCOUNTER — Encounter: Payer: Self-pay | Admitting: Cardiology

## 2015-02-01 ENCOUNTER — Other Ambulatory Visit: Payer: Self-pay | Admitting: Family Medicine

## 2015-02-16 ENCOUNTER — Other Ambulatory Visit: Payer: Self-pay | Admitting: Interventional Cardiology

## 2015-02-20 ENCOUNTER — Telehealth: Payer: Self-pay | Admitting: Interventional Cardiology

## 2015-02-20 DIAGNOSIS — E119 Type 2 diabetes mellitus without complications: Secondary | ICD-10-CM

## 2015-02-20 DIAGNOSIS — E785 Hyperlipidemia, unspecified: Secondary | ICD-10-CM

## 2015-02-20 NOTE — Telephone Encounter (Signed)
New Message     Pt calling in because he wants Labs done before he sees the Doctor   After he gets the labs done he would like to discuss the Labs with the Doctor in visit 02-26-15

## 2015-02-20 NOTE — Telephone Encounter (Signed)
Returned pt call. Pt would like to having his fasting labs drawn prior to seeing Dr.Smith on 7/5. Lab appt scheduled for 7/30. Pt verbalized understanding.

## 2015-02-21 ENCOUNTER — Other Ambulatory Visit (INDEPENDENT_AMBULATORY_CARE_PROVIDER_SITE_OTHER): Payer: 59 | Admitting: *Deleted

## 2015-02-21 ENCOUNTER — Telehealth: Payer: Self-pay

## 2015-02-21 DIAGNOSIS — E119 Type 2 diabetes mellitus without complications: Secondary | ICD-10-CM | POA: Diagnosis not present

## 2015-02-21 DIAGNOSIS — E785 Hyperlipidemia, unspecified: Secondary | ICD-10-CM

## 2015-02-21 LAB — HEPATIC FUNCTION PANEL
ALT: 34 U/L (ref 0–53)
AST: 22 U/L (ref 0–37)
Albumin: 4.2 g/dL (ref 3.5–5.2)
Alkaline Phosphatase: 77 U/L (ref 39–117)
Bilirubin, Direct: 0.1 mg/dL (ref 0.0–0.3)
Total Bilirubin: 0.5 mg/dL (ref 0.2–1.2)
Total Protein: 7 g/dL (ref 6.0–8.3)

## 2015-02-21 LAB — LIPID PANEL
Cholesterol: 106 mg/dL (ref 0–200)
HDL: 32.4 mg/dL — ABNORMAL LOW (ref >39.00)
NonHDL: 73.6
Total CHOL/HDL Ratio: 3
Triglycerides: 231 mg/dL — ABNORMAL HIGH (ref 0.0–149.0)
VLDL: 46.2 mg/dL — ABNORMAL HIGH (ref 0.0–40.0)

## 2015-02-21 LAB — LDL CHOLESTEROL, DIRECT: Direct LDL: 50 mg/dL

## 2015-02-21 LAB — HEMOGLOBIN A1C: Hgb A1c MFr Bld: 7.7 % — ABNORMAL HIGH (ref 4.6–6.5)

## 2015-02-21 NOTE — Telephone Encounter (Signed)
Pt wife aware of lab results. The labs are at target. Continue same therapy. Repeat in one year. Pt wife verbalized.

## 2015-02-21 NOTE — Telephone Encounter (Signed)
-----   Message from [name], MD sent at 02/21/2015  5:08 PM EDT ----- The labs are at target. Continue same therapy. Repeat in one year.

## 2015-02-25 DIAGNOSIS — Z7901 Long term (current) use of anticoagulants: Secondary | ICD-10-CM | POA: Insufficient documentation

## 2015-02-25 NOTE — Progress Notes (Signed)
Cardiology Office Note   Date:  02/26/2015   ID:  Eric Palmer, DOB [birth year], MRN [id number]  PCP:  [name], MD  Cardiologist:  [name], MD   Chief Complaint  Patient presents with  . Atrial Fibrillation      History of Present Illness: Eric Palmer is a 59 y.o. male who presents for coronary artery disease with RCA stent 1996, nonobstructive disease by cath 2005, paroxysmal atrial fibrillation, obstructive sleep apnea, and chronic anticoagulation therapy.  He is noted increasing episodes of racing heart. They cause him to feel weak and he has to lie down. According to his wife on at least 2 occasions when his heart is been out of rhythm, if he stands too quickly he will get dizzy and fall. He denies angina. He denies dyspnea. He feels alcohol and stress increase the frequency of episodes.  Past Medical History  Diagnosis Date  . HYPERLIPIDEMIA 11/01/2008  . Coronary artery disease   . Paroxysmal atrial fibrillation   . Internal hemorrhoids without mention of complication   . ED (erectile dysfunction)   . Obesity   . Anal fissure   . Paresthesia of both legs   . Smoker     QUIT 10/13/13  . Hx of echocardiogram     Echo (10/2013): Mild LVH, moderate focal basal hypertrophy of the septum, EF 50-55%, no RWMA  . Chronic bronchitis     "get it q spring and fall" (10/27/2013)  . OSA on CPAP   . DM type 2, uncontrolled, with neuropathy   . Atrial flutter     Past Surgical History  Procedure Laterality Date  . Tonsillectomy and adenoidectomy  1979  . Inguinal hernia repair Right 1963  . Coronary angioplasty with stent placement  1996    residual 50% LAD and RCA by cath in 2007  . Cardiac catheterization  2007  . Anal fissure repair  ~ 2010  . Coronary angioplasty with stent placement  1995  . Tee without cardioversion N/A 01/11/2014    Procedure: TRANSESOPHAGEAL ECHOCARDIOGRAM (TEE);  Surgeon: [name], MD;  Location: [place];   Service: Cardiovascular;  Laterality: N/A;  . Ablation  01-12-2014    PVI and CTI by Dr [name]  . Atrial fibrillation ablation N/A 01/12/2014    Procedure: ATRIAL FIBRILLATION ABLATION;  Surgeon: Coralyn Mark, MD;  Location: [facility];  Service: Cardiovascular;  Laterality: N/A;     Current Outpatient Prescriptions  Medication Sig Dispense Refill  . apixaban (ELIQUIS) 5 MG TABS tablet Take 1 tablet (5 mg total) by mouth 2 (two) times daily. 60 tablet 11  . atorvastatin (LIPITOR) 40 MG tablet TAKE 1 TABLET BY MOUTH EVERY DAY WITH SUPPER 30 tablet 0  . fish oil-omega-3 fatty acids 1000 MG capsule Take 1 g by mouth 2 (two) times daily.     . Fluticasone-Salmeterol (ADVAIR) 100-50 MCG/DOSE AEPB Inhale 1 puff into the lungs at bedtime.    [name] glucose blood (ONETOUCH VERIO) test strip Check one time daily. DX: 250.00 100 each 3  . metFORMIN (GLUCOPHAGE) 500 MG tablet Take 500 mg by mouth 2 (two) times daily with a meal.    . ONETOUCH DELICA LANCETS FINE MISC Check one time daily. DX: 250.00 100 each 3  . PROAIR HFA 108 (90 BASE) MCG/ACT inhaler INHALE 2 PUFFS BY MOUTH INTO THE LUNGS EVERY 4 HOURS AS NEEDED FOR WHEEZING 8.5 g 0   No current facility-administered medications for this  visit.    Allergies:   Cephalosporins; Penicillins; Codeine; and Gabapentin    Social History:  The patient  reports that he has quit smoking. His smoking use included Cigarettes. He smoked 0.00 packs per day for 35 years. He does not have any smokeless tobacco history on file. He reports that he drinks alcohol. He reports that he does not use illicit drugs.   Family History:  The patient's family history includes Heart disease in his other; Heart disease (age of onset: 17) in his father; Heart disease (age of onset: 34) in his mother; Hyperlipidemia in his other.    ROS:  Please see the history of present illness.   Otherwise, review of systems are positive for no blood in the urine or stool. No head trauma. Both  his feet have pain and feel numb and burn. This is giving him more trouble than anything else..   All other systems are reviewed and negative.    PHYSICAL EXAM: VS:  BP 132/78 mmHg  Pulse 97  Ht 6\' 2"  (1.88 m)  Wt 116.121 kg (256 lb)  BMI 32.85 kg/m2 , BMI Body mass index is 32.85 kg/(m^2). GEN: Well nourished, well developed, in no acute distress HEENT: normal Neck: no JVD, carotid bruits, or masses Cardiac: RRR; no murmurs, rubs, or gallops,no edema  Respiratory:  clear to auscultation bilaterally, normal work of breathing GI: soft, nontender, nondistended, + BS MS: no deformity or atrophy Skin: warm and dry, no rash Neuro:  Strength and sensation are intact Psych: euthymic mood, full affect   EKG:  EKG is ordered today. The ekg ordered today demonstrates normal sinus rhythm rate 97 bpm nonspecific T-wave abnormality. QT is normal.   Recent Labs: 05/23/2014: TSH 1.87 09/26/2014: BUN 12; Creatinine, Ser 1.14; Hemoglobin 15.2; Platelets 280.0; Potassium 4.2; Sodium 138 02/21/2015: ALT 34    Lipid Panel    Component Value Date/Time   CHOL 106 02/21/2015 0735   TRIG 231.0* 02/21/2015 0735   HDL 32.40* 02/21/2015 0735   CHOLHDL 3 02/21/2015 0735   VLDL 46.2* 02/21/2015 0735   LDLCALC 46 01/27/2013 0837   LDLDIRECT 50.0 02/21/2015 0735      Wt Readings from Last 3 Encounters:  02/26/15 116.121 kg (256 lb)  12/17/14 119.75 kg (264 lb)  10/12/14 118.752 kg (261 lb 12.8 oz)      Other studies Reviewed: Additional studies/ records that were reviewed today include: Recent hemoglobin A1c was 7.7. Review of the above records demonstrates: No other data   ASSESSMENT AND PLAN:  1. Atrial fibrillation, unspecified Frequent recurrences of tachycardia and palpitations that we suspect are related to atrial fibrillation.  2. Atherosclerosis of native coronary artery of native heart without angina pectoris Asymptomatic with nonobstructive coronary disease at last  catheterization  3. Dyslipidemia Most recent lipid panel revealed a total and LDL cholesterol that were targeted  4. Chronic anticoagulation A pixel band therapy without bleeding complications    Current medicines are reviewed at length with the patient today.  The patient does not have concerns regarding medicines.  The following changes have been made:  We will switch to a rhythm control strategy. I will start sotalol 80 mg twice a day. Decrease diltiazem to 240 mg per day. Potassium and magnesium levels are obtained today. OP sotalol therapy may need to be optimized depending on response. An ECG will be performed in one week. He will have a follow-up appointment with me in 3-4 weeks.  Labs/ tests ordered today include:  Orders Placed This Encounter  Procedures  . Basic Metabolic Panel (BMET)  . Magnesium  . EKG 12-Lead    Disposition:   FU with HS in 1 month  Signed, [name], MD  02/26/2015 3:58 PM    [facility] [address], [place], [place]  [address] Phone: [phone]; Fax: [phone]

## 2015-02-26 ENCOUNTER — Encounter: Payer: Self-pay | Admitting: Interventional Cardiology

## 2015-02-26 ENCOUNTER — Ambulatory Visit (INDEPENDENT_AMBULATORY_CARE_PROVIDER_SITE_OTHER): Payer: 59 | Admitting: Interventional Cardiology

## 2015-02-26 VITALS — BP 132/78 | HR 97 | Ht 74.0 in | Wt 256.0 lb

## 2015-02-26 DIAGNOSIS — I2510 Atherosclerotic heart disease of native coronary artery without angina pectoris: Secondary | ICD-10-CM

## 2015-02-26 DIAGNOSIS — Z7901 Long term (current) use of anticoagulants: Secondary | ICD-10-CM

## 2015-02-26 DIAGNOSIS — I4891 Unspecified atrial fibrillation: Secondary | ICD-10-CM

## 2015-02-26 DIAGNOSIS — E785 Hyperlipidemia, unspecified: Secondary | ICD-10-CM

## 2015-02-26 DIAGNOSIS — R0989 Other specified symptoms and signs involving the circulatory and respiratory systems: Secondary | ICD-10-CM

## 2015-02-26 MED ORDER — SOTALOL HCL 80 MG PO TABS: 80.0000 mg | ORAL_TABLET | Freq: Two times a day (BID) | ORAL | Status: DC

## 2015-02-26 MED ORDER — APIXABAN 5 MG PO TABS: 5.0000 mg | ORAL_TABLET | Freq: Two times a day (BID) | ORAL | Status: DC

## 2015-02-26 MED ORDER — METFORMIN HCL 500 MG PO TABS: 500.0000 mg | ORAL_TABLET | Freq: Two times a day (BID) | ORAL | Status: DC

## 2015-02-26 MED ORDER — DILTIAZEM HCL ER COATED BEADS 240 MG PO CP24: 240.0000 mg | ORAL_CAPSULE | Freq: Every day | ORAL | Status: DC

## 2015-02-26 MED ORDER — ATORVASTATIN CALCIUM 40 MG PO TABS: 40.0000 mg | ORAL_TABLET | Freq: Every day | ORAL | Status: DC

## 2015-02-26 NOTE — Patient Instructions (Signed)
Medication Instructions:  Your physician recommends that you continue on your current medications as directed. EXCEPT CARTIA   START TAKING CARTIA ONCE A DAY   Labwork:  BMET AND MAGNESIUM   Testing/Procedures:  NONE TODAY    Follow-Up:  IN ONE WEEK WITH A NURSE VISIT WITH AN EKG FOR ATRIAL FIB  FOLLOW UP IN 3 TO 4 WEEKS PER DR SMITH ( CAN DOUBLE BOOK ON THAT DATE)   Any Other Special Instructions Will Be Listed Below (If Applicable).

## 2015-02-27 LAB — BASIC METABOLIC PANEL
BUN: 16 mg/dL (ref 6–23)
CO2: 24 mEq/L (ref 19–32)
Calcium: 9.6 mg/dL (ref 8.4–10.5)
Chloride: 104 mEq/L (ref 96–112)
Creatinine, Ser: 1.11 mg/dL (ref 0.40–1.50)
GFR: 72.28 mL/min (ref >60.00)
Glucose, Bld: 136 mg/dL — ABNORMAL HIGH (ref 70–99)
Potassium: 4.2 mEq/L (ref 3.5–5.1)
Sodium: 138 mEq/L (ref 135–145)

## 2015-02-27 LAB — MAGNESIUM: Magnesium: 1.9 mg/dL (ref 1.5–2.5)

## 2015-03-03 ENCOUNTER — Other Ambulatory Visit: Payer: Self-pay | Admitting: Family Medicine

## 2015-03-06 ENCOUNTER — Encounter: Payer: Self-pay | Admitting: Nurse Practitioner

## 2015-03-06 ENCOUNTER — Ambulatory Visit (INDEPENDENT_AMBULATORY_CARE_PROVIDER_SITE_OTHER): Payer: 59

## 2015-03-06 ENCOUNTER — Telehealth: Payer: Self-pay

## 2015-03-06 VITALS — BP 122/68 | HR 85 | Wt 265.0 lb

## 2015-03-06 DIAGNOSIS — I4891 Unspecified atrial fibrillation: Secondary | ICD-10-CM | POA: Diagnosis not present

## 2015-03-06 NOTE — Telephone Encounter (Signed)
Here for EKG one week out from office visit  Is taking Diltiazem 240 mg per day  Sotalol 80 mg twice a day  Has not felt any irregularities since medication has been changed.  [name] AP NPP reviewed patients EKG, office note and medications

## 2015-03-06 NOTE — Progress Notes (Signed)
Here for EKG one week out from office visit  Is taking Diltiazem 240 mg per day  Sotalol 80 mg twice a day  Has not felt any irregularities since medication has been changed.  Marshell Levan AP NPP reviewed patients EKG, office note and medications

## 2015-03-06 NOTE — Progress Notes (Signed)
EKG with NSR - rate 85.  I left him on his current regimen since symptoms were resolved.

## 2015-03-06 NOTE — Telephone Encounter (Signed)
Where can I view the ECG?

## 2015-03-06 NOTE — Patient Instructions (Signed)
Continue with current treatment  Call if irregularities return or persist and for chest pain or shortness of breath.  Call for any questions, problems or concerns

## 2015-03-06 NOTE — Progress Notes (Signed)
Where is the ECG?

## 2015-03-06 NOTE — Telephone Encounter (Signed)
Taking EKG to medical records to Scan for Dr. Tamala Julian to review

## 2015-03-24 NOTE — Progress Notes (Signed)
Cardiology Office Note   Date:  03/26/2015   ID:  Eric Palmer, DOB 1956/01/24, MRN [id number]  PCP:  [name], MD  Cardiologist:  [name], MD   Chief Complaint  Patient presents with  . Atrial Fibrillation      History of Present Illness: Eric Palmer is a 59 y.o. male who presents for PAF, paroxysmal atrial flutter, history of ablation for paroxysmal PAF and flutter May 2015, coronary artery disease, hyperlipidemia, hypertension, and diabetes mellitus. Also has obstructive sleep apnea.  He was seen relatively recently with increasingly frequent episodes of atrial fibrillation documented by monitor. He is on chronic anticoagulation with Eliquis. He has not had significant recurrences of atrial fibrillation since we started sotalol 80 mg twice a day. Since starting sotalol he has noted diarrhea. At least 2 very loose stools each day. No dyspnea. He denies orthopnea.    Past Medical History  Diagnosis Date  . HYPERLIPIDEMIA 11/01/2008  . Coronary artery disease   . Paroxysmal atrial fibrillation   . Internal hemorrhoids without mention of complication   . ED (erectile dysfunction)   . Obesity   . Anal fissure   . Paresthesia of both legs   . Smoker     QUIT 10/13/13  . Hx of echocardiogram     Echo (10/2013): Mild LVH, moderate focal basal hypertrophy of the septum, EF 50-55%, no RWMA  . Chronic bronchitis     "get it q spring and fall" (10/27/2013)  . OSA on CPAP   . DM type 2, uncontrolled, with neuropathy   . Atrial flutter     Past Surgical History  Procedure Laterality Date  . Tonsillectomy and adenoidectomy  1979  . Inguinal hernia repair Right 1963  . Coronary angioplasty with stent placement  1996    residual 50% LAD and RCA by cath in 2007  . Cardiac catheterization  2007  . Anal fissure repair  ~ 2010  . Coronary angioplasty with stent placement  1995  . Tee without cardioversion N/A 01/11/2014    Procedure: TRANSESOPHAGEAL  ECHOCARDIOGRAM (TEE);  Surgeon: [name], MD;  Location: [place] [facility];  Service: Cardiovascular;  Laterality: N/A;  . Ablation  01-12-2014    PVI and CTI by Dr [name]  . Atrial fibrillation ablation N/A 01/12/2014    Procedure: ATRIAL FIBRILLATION ABLATION;  Surgeon: Coralyn Mark, MD;  Location: [place] CATH [facility];  Service: Cardiovascular;  Laterality: N/A;     Current Outpatient Prescriptions  Medication Sig Dispense Refill  . apixaban (ELIQUIS) 5 MG TABS tablet Take 1 tablet (5 mg total) by mouth 2 (two) times daily. 60 tablet 11  . atorvastatin (LIPITOR) 40 MG tablet Take 1 tablet (40 mg total) by mouth daily at 6 PM. 30 tablet 11  . diltiazem (CARTIA XT) 240 MG 24 hr capsule Take 1 capsule (240 mg total) by mouth daily. 180 capsule 3  . fish oil-omega-3 fatty acids 1000 MG capsule Take 1 g by mouth 2 (two) times daily.     . Fluticasone-Salmeterol (ADVAIR) 100-50 MCG/DOSE AEPB Inhale 1 puff into the lungs at bedtime.    [name] glucose blood (ONETOUCH VERIO) test strip Check one time daily. DX: 250.00 100 each 3  . metFORMIN (GLUCOPHAGE) 500 MG tablet Take 1 tablet (500 mg total) by mouth 2 (two) times daily with a meal. 60 tablet 11  . ONETOUCH DELICA LANCETS FINE MISC Check one time daily. DX: 250.00 100 each 3  . PROAIR  HFA 108 (90 BASE) MCG/ACT inhaler INHALE 2 PUFFS INTO THE LUNGS EVERY 4 HOURS AS NEEDED FOR WHEEZING 8.5 g 2  . sotalol (BETAPACE) 80 MG tablet Take 80 mg by mouth 2 (two) times daily.     No current facility-administered medications for this visit.    Allergies:   Cephalosporins; Penicillins; Codeine; and Gabapentin    Social History:  The patient  reports that he has quit smoking. His smoking use included Cigarettes. He smoked 0.00 packs per day for 35 years. He has never used smokeless tobacco. He reports that he drinks alcohol. He reports that he does not use illicit drugs.   Family History:  The patient's family history includes Heart disease in his other; Heart  disease (age of onset: 57) in his father; Heart disease (age of onset: 74) in his mother; Hyperlipidemia in his other.    ROS:  Please see the history of present illness.   Otherwise, review of systems are positive for loose diarrhea. He denies abdominal pain, chills, fever associated with the diarrhea. Prior to starting sotalol has had diarrhea associated with metformin..   All other systems are reviewed and negative.    PHYSICAL EXAM: VS:  BP 116/68 mmHg  Pulse 91  Ht 6\' 2"  (1.88 m)  Wt 117.482 kg (259 lb)  BMI 33.24 kg/m2  SpO2 94% , BMI Body mass index is 33.24 kg/(m^2). GEN: Well nourished, well developed, in no acute distress HEENT: normal Neck: no JVD, carotid bruits, or masses Cardiac: RRR; no murmurs, rubs, or gallops,no edema  Respiratory:  clear to auscultation bilaterally, normal work of breathing GI: soft, nontender, nondistended, + BS MS: no deformity or atrophy Skin: warm and dry, no rash Neuro:  Strength and sensation are intact Psych: euthymic mood, full affect   EKG:  EKG is not ordered today.    Recent Labs: 05/23/2014: TSH 1.87 09/26/2014: Hemoglobin 15.2; Platelets 280.0 02/21/2015: ALT 34 02/26/2015: BUN 16; Creatinine, Ser 1.11; Magnesium 1.9; Potassium 4.2; Sodium 138    Lipid Panel    Component Value Date/Time   CHOL 106 02/21/2015 0735   TRIG 231.0* 02/21/2015 0735   HDL 32.40* 02/21/2015 0735   CHOLHDL 3 02/21/2015 0735   VLDL 46.2* 02/21/2015 0735   LDLCALC 46 01/27/2013 0837   LDLDIRECT 50.0 02/21/2015 0735      Wt Readings from Last 3 Encounters:  03/26/15 117.482 kg (259 lb)  03/06/15 120.203 kg (265 lb)  02/26/15 116.121 kg (256 lb)      Other studies Reviewed: Additional studies/ records that were reviewed today include: .    ASSESSMENT AND PLAN:  1. Obstructive sleep apnea Being treated with CPAP  2. Atherosclerosis of native coronary artery of native heart without angina pectoris Asymptomatic  3. Chronic  anticoagulation No complications on Eliquis  4. Paroxysmal atrial fibrillation Now with better control on sotalol therapy. Now having diarrhea. Possibly related to sotalol.   5. Dyslipidemia On therapy.  6. Tobacco use disorder Encouraged to discontinue.  7. Watery diarrhea up to twice a day since starting sotalol    Current medicines are reviewed at length with the patient today.  The patient has concerns regarding medicines.  The following changes have been made:  Possible sotalol related loose stools. He is also chewing an incredible amount of gum that has sorbitol. Discontinue the gum. If diarrhea continues we will discontinue sotalol. If we are unable to use sotalol, I'll refer him back to the A. fib clinic.  Labs/ tests ordered  today include:  No orders of the defined types were placed in this encounter.     Disposition:   FU with HS in 3 months  Signed, [name], MD  03/26/2015 5:20 PM    [facility], [place], [place]  [address] Phone: [phone]; Fax: [phone]

## 2015-03-26 ENCOUNTER — Encounter: Payer: Self-pay | Admitting: Interventional Cardiology

## 2015-03-26 ENCOUNTER — Ambulatory Visit (INDEPENDENT_AMBULATORY_CARE_PROVIDER_SITE_OTHER): Payer: 59 | Admitting: Interventional Cardiology

## 2015-03-26 VITALS — BP 116/68 | HR 91 | Ht 74.0 in | Wt 259.0 lb

## 2015-03-26 DIAGNOSIS — I480 Paroxysmal atrial fibrillation: Secondary | ICD-10-CM

## 2015-03-26 DIAGNOSIS — Z7901 Long term (current) use of anticoagulants: Secondary | ICD-10-CM | POA: Diagnosis not present

## 2015-03-26 DIAGNOSIS — I483 Typical atrial flutter: Secondary | ICD-10-CM

## 2015-03-26 DIAGNOSIS — I2510 Atherosclerotic heart disease of native coronary artery without angina pectoris: Secondary | ICD-10-CM

## 2015-03-26 DIAGNOSIS — Z720 Tobacco use: Secondary | ICD-10-CM

## 2015-03-26 DIAGNOSIS — E785 Hyperlipidemia, unspecified: Secondary | ICD-10-CM

## 2015-03-26 DIAGNOSIS — R197 Diarrhea, unspecified: Secondary | ICD-10-CM

## 2015-03-26 DIAGNOSIS — G4733 Obstructive sleep apnea (adult) (pediatric): Secondary | ICD-10-CM | POA: Diagnosis not present

## 2015-03-26 DIAGNOSIS — J410 Simple chronic bronchitis: Secondary | ICD-10-CM

## 2015-03-26 DIAGNOSIS — F17200 Nicotine dependence, unspecified, uncomplicated: Secondary | ICD-10-CM

## 2015-03-26 NOTE — Patient Instructions (Signed)
Medication Instructions:  Your physician recommends that you continue on your current medications as directed. Please refer to the Current Medication list given to you today.   Labwork: None   Testing/Procedures: None   Follow-Up: Your physician recommends that you schedule a follow-up appointment in: 3 months   Any Other Special Instructions Will Be Listed Below (If Applicable). Call the office in 1 week with an update of your symptoms

## 2015-04-16 ENCOUNTER — Ambulatory Visit: Payer: 59 | Admitting: Cardiology

## 2015-05-29 ENCOUNTER — Ambulatory Visit (INDEPENDENT_AMBULATORY_CARE_PROVIDER_SITE_OTHER): Payer: 59 | Admitting: Family Medicine

## 2015-05-29 ENCOUNTER — Encounter: Payer: Self-pay | Admitting: Family Medicine

## 2015-05-29 VITALS — BP 120/80 | HR 92 | Temp 98.5°F | Wt 255.0 lb

## 2015-05-29 DIAGNOSIS — E1142 Type 2 diabetes mellitus with diabetic polyneuropathy: Secondary | ICD-10-CM

## 2015-05-29 DIAGNOSIS — G629 Polyneuropathy, unspecified: Secondary | ICD-10-CM | POA: Diagnosis not present

## 2015-05-29 DIAGNOSIS — H9313 Tinnitus, bilateral: Secondary | ICD-10-CM | POA: Diagnosis not present

## 2015-05-29 DIAGNOSIS — G608 Other hereditary and idiopathic neuropathies: Secondary | ICD-10-CM

## 2015-05-29 NOTE — Progress Notes (Signed)
Subjective:    Patient ID: Eric Palmer, male    DOB: 1955/11/21, 59 y.o.   MRN: [id number]  HPI Patient seen with progressive bilateral foot burning sensation and tingling with intermittent numbness over several months. He has type 2 diabetes and poor compliance with follow-up. He had hemoglobin A1c per cardiology back in June 7.7%. Currently takes metformin 500 mg twice a day. He's had diarrhea with higher doses of metformin. Does not monitor blood sugars regularly.  He denies any claudication symptoms. He has burning sensation mostly on the bottoms of both feet both at rest and with activity. Symptoms are quite severe at times. No alleviating symptoms. Feet are warm to touch.  Patient also complains of ringing in both ears. No sudden hearing changes. No vertigo. No high-dose use of aspirin.  He has multiple other chronic problems including history of obesity, atrial fibrillation, CAD, hyperlipidemia, nicotine use. Currently dealing with stress of one of his sons has a heroine addiction.  Past Medical History  Diagnosis Date  . HYPERLIPIDEMIA 11/01/2008  . Coronary artery disease   . Paroxysmal atrial fibrillation ([name])   . Internal hemorrhoids without mention of complication   . ED (erectile dysfunction)   . Obesity   . Anal fissure   . Paresthesia of both legs   . Smoker     QUIT 10/13/13  . Hx of echocardiogram     Echo (10/2013): Mild LVH, moderate focal basal hypertrophy of the septum, EF 50-55%, no RWMA  . Chronic bronchitis ([place])     "get it q spring and fall" (10/27/2013)  . OSA on CPAP   . DM type 2, uncontrolled, with neuropathy ([place])   . Atrial flutter [facility])    Past Surgical History  Procedure Laterality Date  . Tonsillectomy and adenoidectomy  1979  . Inguinal hernia repair Right 1963  . Coronary angioplasty with stent placement  1996    residual 50% LAD and RCA by cath in 2007  . Cardiac catheterization  2007  . Anal fissure repair  ~ 2010  . Coronary  angioplasty with stent placement  1995  . Tee without cardioversion N/A 01/11/2014    Procedure: TRANSESOPHAGEAL ECHOCARDIOGRAM (TEE);  Surgeon: [name], MD;  Location: [facility];  Service: Cardiovascular;  Laterality: N/A;  . Ablation  01-12-2014    PVI and CTI by Dr [name]  . Atrial fibrillation ablation N/A 01/12/2014    Procedure: ATRIAL FIBRILLATION ABLATION;  Surgeon: Coralyn Mark, MD;  Location: [facility] C[facility];  Service: Cardiovascular;  Laterality: N/A;    reports that he has quit smoking. His smoking use included Cigarettes. He smoked 0.00 packs per day for 35 years. He has never used smokeless tobacco. He reports that he drinks alcohol. He reports that he does not use illicit drugs. family history includes Heart disease in his other; Heart disease (age of onset: 80) in his father; Heart disease (age of onset: 80) in his mother; Hyperlipidemia in his other. Allergies  Allergen Reactions  . Cephalosporins Anaphylaxis  . Penicillins Anaphylaxis  . Codeine Itching  . Gabapentin Other (See Comments)    "made me feel drugged"      Review of Systems  Constitutional: Positive for fatigue. Negative for appetite change and unexpected weight change.  Eyes: Negative for visual disturbance.  Respiratory: Negative for cough, chest tightness and shortness of breath.   Cardiovascular: Negative for chest pain, palpitations and leg swelling.  Neurological: Positive for numbness. Negative for dizziness, syncope, weakness,  light-headedness and headaches.       Objective:   Physical Exam  Constitutional: He appears well-developed and well-nourished. No distress.  Neck: Neck supple. No thyromegaly present.  Cardiovascular: Normal rate and regular rhythm.   Both feet are warm to touch with 1+ dorsalis pedis pulses. Good capillary refill.  Pulmonary/Chest: Effort normal and breath sounds normal. No respiratory distress. He has no wheezes. He has no rales.  Musculoskeletal: He exhibits  no edema.  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 Suspect largely related to diabetes. Check other labs with TSH, B12, serum protein electrophoresis. Tightness up diabetes control. Consider low-dose Lyrica if above labs normal #2 type 2 diabetes. History of suboptimal control. Recheck A1c. Consider further titration of metformin with extended release which could reduce risk of diarrhea. Also consider S GLT 2 inhibitor or GLP-1 class if necessary for tightening up control #3 bilateral tinnitus. Nonfocal exam. Probably related to sensorineural hearing loss. Consider audiology referral if patient interested

## 2015-05-29 NOTE — Progress Notes (Signed)
Pre visit review using our clinic review tool, if applicable. No additional management support is needed unless otherwise documented below in the visit note. 

## 2015-05-30 LAB — HEMOGLOBIN A1C: Hgb A1c MFr Bld: 7.2 % — ABNORMAL HIGH (ref 4.6–6.5)

## 2015-05-30 LAB — VITAMIN B12: Vitamin B-12: 590 pg/mL (ref 211–911)

## 2015-05-30 LAB — TSH: TSH: 1.42 u[IU]/mL (ref 0.35–4.50)

## 2015-05-30 LAB — SEDIMENTATION RATE: Sed Rate: 8 mm/hr (ref 0–22)

## 2015-05-31 LAB — PROTEIN ELECTROPHORESIS, SERUM
Albumin ELP: 4.1 g/dL (ref 3.8–4.8)
Alpha-1-Globulin: 0.3 g/dL (ref 0.2–0.3)
Alpha-2-Globulin: 0.9 g/dL (ref 0.5–0.9)
Beta 2: 0.4 g/dL (ref 0.2–0.5)
Beta Globulin: 0.4 g/dL (ref 0.4–0.6)
Gamma Globulin: 0.9 g/dL (ref 0.8–1.7)
Total Protein, Serum Electrophoresis: 6.9 g/dL (ref 6.1–8.1)

## 2015-06-04 MED ORDER — PREGABALIN 50 MG PO CAPS: 50.0000 mg | ORAL_CAPSULE | Freq: Three times a day (TID) | ORAL | Status: DC

## 2015-06-04 NOTE — Addendum Note (Signed)
Addended by: Ailene Rud E on: 06/04/2015 10:48 AM   Modules accepted: Orders

## 2015-06-05 ENCOUNTER — Telehealth: Payer: Self-pay

## 2015-06-05 MED ORDER — PREGABALIN 50 MG PO CAPS: 50.0000 mg | ORAL_CAPSULE | Freq: Three times a day (TID) | ORAL | Status: DC

## 2015-06-05 NOTE — Telephone Encounter (Signed)
-----   Message from [name], MD sent at 05/30/2015  5:38 PM EDT ----- Yes. That is why we ordered the SPEP and this usually takes a few days to get back.

## 2015-06-05 NOTE — Telephone Encounter (Signed)
Lyrica called into pharmacy.  Tried to contact patient to get him to schedule a follow up.

## 2015-06-07 ENCOUNTER — Other Ambulatory Visit: Payer: Self-pay | Admitting: Family Medicine

## 2015-06-20 ENCOUNTER — Ambulatory Visit: Payer: 59 | Admitting: Interventional Cardiology

## 2015-06-26 ENCOUNTER — Encounter: Payer: Self-pay | Admitting: Physician Assistant

## 2015-06-26 ENCOUNTER — Ambulatory Visit (INDEPENDENT_AMBULATORY_CARE_PROVIDER_SITE_OTHER): Payer: 59 | Admitting: Physician Assistant

## 2015-06-26 VITALS — BP 118/70 | HR 88 | Ht 74.0 in | Wt 256.0 lb

## 2015-06-26 DIAGNOSIS — I480 Paroxysmal atrial fibrillation: Secondary | ICD-10-CM | POA: Diagnosis not present

## 2015-06-26 DIAGNOSIS — I10 Essential (primary) hypertension: Secondary | ICD-10-CM

## 2015-06-26 DIAGNOSIS — I2510 Atherosclerotic heart disease of native coronary artery without angina pectoris: Secondary | ICD-10-CM | POA: Diagnosis not present

## 2015-06-26 DIAGNOSIS — Z9861 Coronary angioplasty status: Secondary | ICD-10-CM

## 2015-06-26 DIAGNOSIS — E785 Hyperlipidemia, unspecified: Secondary | ICD-10-CM

## 2015-06-26 DIAGNOSIS — I4892 Unspecified atrial flutter: Secondary | ICD-10-CM

## 2015-06-26 DIAGNOSIS — E669 Obesity, unspecified: Secondary | ICD-10-CM | POA: Insufficient documentation

## 2015-06-26 DIAGNOSIS — I25119 Atherosclerotic heart disease of native coronary artery with unspecified angina pectoris: Secondary | ICD-10-CM | POA: Insufficient documentation

## 2015-06-26 LAB — CBC WITH DIFFERENTIAL/PLATELET
Basophils Absolute: 0 10*3/uL (ref 0.0–0.1)
Basophils Relative: 0 % (ref 0–1)
Eosinophils Absolute: 0.7 10*3/uL (ref 0.0–0.7)
Eosinophils Relative: 6 % — ABNORMAL HIGH (ref 0–5)
HCT: 45.6 % (ref 39.0–52.0)
Hemoglobin: 15.9 g/dL (ref 13.0–17.0)
Lymphocytes Relative: 26 % (ref 12–46)
Lymphs Abs: 3 10*3/uL (ref 0.7–4.0)
MCH: 31 pg (ref 26.0–34.0)
MCHC: 34.9 g/dL (ref 30.0–36.0)
MCV: 88.9 fL (ref 78.0–100.0)
MPV: 9.4 fL (ref 8.6–12.4)
Monocytes Absolute: 1.4 10*3/uL — ABNORMAL HIGH (ref 0.1–1.0)
Monocytes Relative: 12 % (ref 3–12)
Neutro Abs: 6.4 10*3/uL (ref 1.7–7.7)
Neutrophils Relative %: 56 % (ref 43–77)
Platelets: 271 10*3/uL (ref 150–400)
RBC: 5.13 MIL/uL (ref 4.22–5.81)
RDW: 13.3 % (ref 11.5–15.5)
WBC: 11.4 10*3/uL — ABNORMAL HIGH (ref 4.0–10.5)

## 2015-06-26 LAB — BASIC METABOLIC PANEL
BUN: 17 mg/dL (ref 7–25)
CO2: 24 mmol/L (ref 20–31)
Calcium: 9.3 mg/dL (ref 8.6–10.3)
Chloride: 101 mmol/L (ref 98–110)
Creat: 1.12 mg/dL (ref 0.70–1.33)
Glucose, Bld: 205 mg/dL — ABNORMAL HIGH (ref 65–99)
Potassium: 4 mmol/L (ref 3.5–5.3)
Sodium: 136 mmol/L (ref 135–146)

## 2015-06-26 MED ORDER — RIVAROXABAN 20 MG PO TABS: 20.0000 mg | ORAL_TABLET | Freq: Every day | ORAL | Status: DC

## 2015-06-26 NOTE — Patient Instructions (Addendum)
Medication Instructions:  Your physician has recommended you make the following change in your medication:  1-STOP Aspirin 2- START Xarelto 20 mg by mouth with biggest meal of the day (Lunch or Dinner)  Labwork: Your physician recommends that you have labs today BMET and CBC   Testing/Procedures: NONE  Follow-Up: Your physician recommends that you schedule a follow-up appointment in: 4 months with Dr. Tamala Julian.   If you need a refill on your cardiac medications before your next appointment, please call your pharmacy.

## 2015-06-26 NOTE — Progress Notes (Signed)
Cardiology Office Note Date:  06/26/2015  Patient ID:  Eric Palmer, Eric Palmer March 05, 1956, MRN [id number] PCP:  [name], MD  Cardiologist: Dr. Tamala Julian   Chief Complaint: f/u arrhythmias, CAD  History of Present Illness: Eric Palmer is a 59 y.o. male with history of PAF, paroxysmal atrial flutter, s/p ablation of AF/AFL in 12/2013, CAD s/p PCI 1996, HTN, HLD, DM, OSA, prior tobacco abuse who presents for follow-up of atrial arrhythmias. He has failed medical therapy with Multaq and Tikosyn. He was felt to be having increased episodes of AF in 03/2015 and was started on sotalol. Last heart cath 01/2006 showed 50% mid LAD, otherwise widely patent cors with minimal irregularities. Nuclear stress test in 04/2009 was reportedly low risk, no ischemia. TEE 12/2013: EF 50-55%, otherwise no significant abnormalities. Last labs showed normal TSH (05/2015), Cr 1.11 (02/2015), Hgb 15.2 (09/2014).  At his last OV in 02/2015, he was complaining of diarrhea. Dr. Thompson Caul recommendation was to discontinue sotalol if his diarrhea continued. The patient says he thought that he should discontinue Eliquis if the diarrhea continued. He discontinued Eliquis and had subsequent resolution of diarrhea. He has since started taking ASA 325mg  daily. He said he called our office to discuss but never got a call back. I can not find any phone notes regarding this. He made this appointment today because he wanted to discuss this change with [place]. He is feeling well today. Palpitations are much better controlled on sotalol. He has not had any bleeding. No CP or SOB.    Past Medical History  Diagnosis Date  . Hyperlipidemia   . Coronary artery disease     a. s/p PCI in 1996. b. LHC 01/2006 showed 50% mid LAD, otherwise widely patent cors with minimal irregularities. c. Nuclear stress test in 04/2009 was reportedly low risk, no ischemia.  . Paroxysmal atrial fibrillation ([place])     a. failed Tikosyn, Multaq. b. s/p ablation of  AF/AFL in 12/2013.  [name] Internal hemorrhoids without mention of complication   . ED (erectile dysfunction)   . Obesity   . Anal fissure   . Paresthesia of both legs   . Smoker     QUIT 10/13/13  . Hx of echocardiogram     Echo (10/2013): Mild LVH, moderate focal basal hypertrophy of the septum, EF 50-55%, no RWMA  . Chronic bronchitis ([place])     "get it q spring and fall" (10/27/2013)  . OSA on CPAP   . DM type 2, uncontrolled, with neuropathy ([place])   . Paroxysmal atrial flutter ([place])     a. s/p ablation of AF/AFL in 12/2013.  [name] Essential hypertension   . Obesity     Past Surgical History  Procedure Laterality Date  . Tonsillectomy and adenoidectomy  1979  . Inguinal hernia repair Right 1963  . Coronary angioplasty with stent placement  1996    residual 50% LAD and RCA by cath in 2007  . Cardiac catheterization  2007  . Anal fissure repair  ~ 2010  . Coronary angioplasty with stent placement  1995  . Tee without cardioversion N/A 01/11/2014    Procedure: TRANSESOPHAGEAL ECHOCARDIOGRAM (TEE);  Surgeon: [name], MD;  Location: [facility];  Service: Cardiovascular;  Laterality: N/A;  . Ablation  01-12-2014    PVI and CTI by Dr [name]  . Atrial fibrillation ablation N/A 01/12/2014    Procedure: ATRIAL FIBRILLATION ABLATION;  Surgeon: Coralyn Mark, MD;  Location: [facility] CATH LAB;  Service: Cardiovascular;  Laterality: N/A;    Current Outpatient Prescriptions  Medication Sig Dispense Refill  . aspirin 325 MG tablet Take 325 mg by mouth daily.    [name] atorvastatin (LIPITOR) 40 MG tablet Take 1 tablet (40 mg total) by mouth daily at 6 PM. 30 tablet 11  . diltiazem (CARTIA XT) 240 MG 24 hr capsule Take 1 capsule (240 mg total) by mouth daily. 180 capsule 3  . fish oil-omega-3 fatty acids 1000 MG capsule Take 1 g by mouth 2 (two) times daily.     . Fluticasone-Salmeterol (ADVAIR) 100-50 MCG/DOSE AEPB Inhale 1 puff into the lungs at bedtime.    [name] glucose blood (ONETOUCH VERIO) test strip  Check one time daily. DX: 250.00 100 each 3  . metFORMIN (GLUCOPHAGE) 500 MG tablet Take 1 tablet (500 mg total) by mouth 2 (two) times daily with a meal. 60 tablet 11  . ONETOUCH DELICA LANCETS FINE MISC Check one time daily. DX: 250.00 100 each 3  . pregabalin (LYRICA) 50 MG capsule Take 1 capsule (50 mg total) by mouth 3 (three) times daily. 90 capsule 3  . PROAIR HFA 108 (90 BASE) MCG/ACT inhaler INHALE 2 PUFFS INTO THE LUNGS EVERY 4 HOURS AS NEEDED FOR WHEEZING 8.5 g 0  . sotalol (BETAPACE) 80 MG tablet Take 80 mg by mouth 2 (two) times daily.     No current facility-administered medications for this visit.    Allergies:   Cephalosporins; Penicillins; Codeine; and Gabapentin   Social History:  The patient  reports that he has quit smoking. His smoking use included Cigarettes. He smoked 0.00 packs per day for 35 years. He has never used smokeless tobacco. He reports that he drinks alcohol. He reports that he does not use illicit drugs.   Family History:  The patient's family history includes Heart attack in his father; Heart disease in his other; Heart disease (age of onset: 78) in his father; Heart disease (age of onset: 37) in his mother; Hyperlipidemia in his other; Hypertension in his mother. There is no history of Stroke.  ROS:  Please see the history of present illness. + peripheral neuropathy in feet being worked up by PCP.   All other systems are reviewed and otherwise negative.   PHYSICAL EXAM:  VS:  BP 118/70 mmHg  Pulse 88  Ht 6\' 2"  (1.88 m)  Wt 256 lb (116.121 kg)  BMI 32.85 kg/m2 BMI: Body mass index is 32.85 kg/(m^2). Well nourished, well developed WM, in no acute distress HEENT: normocephalic, atraumatic Neck: no JVD, carotid bruits or masses Cardiac:  normal S1, S2; RRR; no murmurs, rubs, or gallops Lungs:  clear to auscultation bilaterally, no wheezing, rhonchi or rales Abd: soft, nontender, no hepatomegaly, + BS MS: no deformity or atrophy Ext: no edema Skin:  warm and dry, no rash Neuro:  moves all extremities spontaneously, no focal abnormalities noted, follows commands Psych: euthymic mood, full affect  EKG:  Done today shows NSR 88bpm, no acute ST-T changes, QTc 430ms  Recent Labs: 09/26/2014: Hemoglobin 15.2; Platelets 280.0 02/21/2015: ALT 34 02/26/2015: BUN 16; Creatinine, Ser 1.11; Magnesium 1.9; Potassium 4.2; Sodium 138 05/29/2015: TSH 1.42  02/21/2015: Cholesterol 106; Direct LDL 50.0; HDL 32.40*; Total CHOL/HDL Ratio 3; Triglycerides 231.0*; VLDL 46.2*   CrCl cannot be calculated (Patient has no serum creatinine result on file.).   Wt Readings from Last 3 Encounters:  06/26/15 256 lb (116.121 kg)  05/29/15 255 lb (115.667 kg)  03/26/15 259 lb (117.482 kg)  Other studies reviewed: Additional studies/records reviewed today include: summarized above  ASSESSMENT AND PLAN:  1. Paroxysmal atrial fibrillation/atrial flutter - palpitations are much improved on sotalol. The paitent discontinued Eliquis due to diarrhea and put himself on aspirin. Diarrhea resolved after Eliquis was stopped. Unfortunately with his CHADSVASC score of 3, aspirin does not confer enough stroke prophylaxis with regard to his atrial arrhythmias. We discussed options/benefits/risks of Coumadin, Pradaxa, and Xarelto as alternatives. He elects to go on Xarelto. Will update his BMET and CBC for baseline to make sure 20mg  daily is appropriate. Stop aspirin. Continue sotalol. QTc is fine. 2. CAD - no recent anginal symptoms. 3. Essential HTN - controlled. 4. Hyperlipidemia - Dr. Tamala Julian plans to repeat labs in 01/2016.  Disposition: F/u with Dr. Tamala Julian in 4 months.  Current medicines are reviewed at length with the patient today.  The patient did not have any concerns regarding medicines.  Signed, Melina Copa PA-C 06/26/2015 3:21 PM     [address] [place]  [place] [address] [phone] (office)  [phone] (fax)

## 2015-06-27 ENCOUNTER — Other Ambulatory Visit: Payer: Self-pay | Admitting: Family Medicine

## 2015-06-30 ENCOUNTER — Encounter: Payer: Self-pay | Admitting: Interventional Cardiology

## 2015-08-14 ENCOUNTER — Other Ambulatory Visit: Payer: Self-pay | Admitting: Family Medicine

## 2015-08-16 ENCOUNTER — Other Ambulatory Visit: Payer: Self-pay | Admitting: Family Medicine

## 2015-09-10 ENCOUNTER — Other Ambulatory Visit: Payer: Self-pay | Admitting: Interventional Cardiology

## 2015-10-02 ENCOUNTER — Ambulatory Visit: Payer: 59 | Admitting: Interventional Cardiology

## 2015-10-14 ENCOUNTER — Other Ambulatory Visit: Payer: Self-pay | Admitting: Family Medicine

## 2015-10-23 ENCOUNTER — Encounter: Payer: Self-pay | Admitting: Family Medicine

## 2015-10-23 ENCOUNTER — Ambulatory Visit (INDEPENDENT_AMBULATORY_CARE_PROVIDER_SITE_OTHER): Payer: 59 | Admitting: Family Medicine

## 2015-10-23 VITALS — BP 122/80 | HR 95 | Temp 98.2°F | Wt 264.0 lb

## 2015-10-23 DIAGNOSIS — H9313 Tinnitus, bilateral: Secondary | ICD-10-CM | POA: Diagnosis not present

## 2015-10-23 DIAGNOSIS — E1140 Type 2 diabetes mellitus with diabetic neuropathy, unspecified: Secondary | ICD-10-CM | POA: Insufficient documentation

## 2015-10-23 DIAGNOSIS — E1142 Type 2 diabetes mellitus with diabetic polyneuropathy: Secondary | ICD-10-CM

## 2015-10-23 NOTE — Patient Instructions (Signed)
Go ahead and increase the Lyrica to 100 mg three times daily. Schedule complete physical.

## 2015-10-23 NOTE — Progress Notes (Signed)
Pre visit review using our clinic review tool, if applicable. No additional management support is needed unless otherwise documented below in the visit note. 

## 2015-10-23 NOTE — Progress Notes (Signed)
Subjective:    Patient ID: Eric Palmer, male    DOB: 1956/08/08, 60 y.o.   MRN: [id number]  HPI  patient seen today for the following issues   Several month history of bilateral tinnitus. He thinks he might have had some mild subjective hearing loss. He has not seen audiologist  yet. No unilateral symptoms. No headaches. No sudden symptoms  he has not had his hearing tested anytime recently. No high-dose aspirin use.  Denies vertigo. No exacerbating or alleviating factors.   Bilateral foot pain. He describes neuropathy symptoms with burning sensation in both feet radiating up to the ankles. Was seen back in October and had TSH, sedimentation rate, B12, and serum protein electrophoresis which were all normal. He was placed on Lyrica 50 mg 3 times a day. Does not think this has helped much. He does have diabetes with most recent A1c 7.2%.  Severity of pain is 5 out of 10. Not interfering with sleep.  Past Medical History  Diagnosis Date  . Hyperlipidemia   . Coronary artery disease     a. s/p PCI in 1996. b. LHC 01/2006 showed 50% mid LAD, otherwise widely patent cors with minimal irregularities. c. Nuclear stress test in 04/2009 was reportedly low risk, no ischemia.  . Paroxysmal atrial fibrillation ([place])     a. failed Tikosyn, Multaq. b. s/p ablation of AF/AFL in 12/2013.  [name] Internal hemorrhoids without mention of complication   . ED (erectile dysfunction)   . Obesity   . Anal fissure   . Paresthesia of both legs   . Smoker     QUIT 10/13/13  . Hx of echocardiogram     Echo (10/2013): Mild LVH, moderate focal basal hypertrophy of the septum, EF 50-55%, no RWMA  . Chronic bronchitis ([place])     "get it q spring and fall" (10/27/2013)  . OSA on CPAP   . DM type 2, uncontrolled, with neuropathy ([place])   . Paroxysmal atrial flutter ([place])     a. s/p ablation of AF/AFL in 12/2013.  [name] Essential hypertension   . Obesity    Past Surgical History  Procedure Laterality Date  . Tonsillectomy  and adenoidectomy  1979  . Inguinal hernia repair Right 1963  . Coronary angioplasty with stent placement  1996    residual 50% LAD and RCA by cath in 2007  . Cardiac catheterization  2007  . Anal fissure repair  ~ 2010  . Coronary angioplasty with stent placement  1995  . Tee without cardioversion N/A 01/11/2014    Procedure: TRANSESOPHAGEAL ECHOCARDIOGRAM (TEE);  Surgeon: [name], MD;  Location: [facility];  Service: Cardiovascular;  Laterality: N/A;  . Ablation  01-12-2014    PVI and CTI by Dr [name]  . Atrial fibrillation ablation N/A 01/12/2014    Procedure: ATRIAL FIBRILLATION ABLATION;  Surgeon: Coralyn Mark, MD;  Location: [facility] CATH LAB;  Service: Cardiovascular;  Laterality: N/A;    reports that he has quit smoking. His smoking use included Cigarettes. He smoked 0.00 packs per day for 35 years. He has never used smokeless tobacco. He reports that he drinks alcohol. He reports that he does not use illicit drugs. family history includes Heart attack in his father; Heart disease in his other; Heart disease (age of onset: 60) in his father; Heart disease (age of onset: 42) in his mother; Hyperlipidemia in his other; Hypertension in his mother. There is no history of Stroke. Allergies  Allergen Reactions  . Cephalosporins  Anaphylaxis  . Penicillins Anaphylaxis  . Codeine Itching  . Gabapentin Other (See Comments)    "made me feel drugged"  . Eliquis [Apixaban]     Diarrhea      Review of Systems  Constitutional: Negative for fatigue.  Eyes: Negative for visual disturbance.  Respiratory: Negative for cough, chest tightness and shortness of breath.   Cardiovascular: Negative for chest pain, palpitations and leg swelling.  Neurological: Negative for dizziness, syncope, weakness, light-headedness and headaches.       Objective:   Physical Exam  Constitutional: He is oriented to person, place, and time. He appears well-developed and well-nourished.  HENT:  Right Ear:  External ear normal.  Left Ear: External ear normal.  Mouth/Throat: Oropharynx is clear and moist.  Eyes: Pupils are equal, round, and reactive to light.  Neck: Neck supple. No thyromegaly present.  Cardiovascular: Normal rate and regular rhythm.   Pulmonary/Chest: Effort normal and breath sounds normal. No respiratory distress. He has no wheezes. He has no rales.  Musculoskeletal: He exhibits no edema.  Neurological: He is alert and oriented to person, place, and time.  Skin:  Feet reveal no skin lesions. Good distal foot pulses. Good capillary refill. No calluses. Normal sensation with monofilament testing           Assessment & Plan:   #1 bilateral foot pain. He describes neuropathy pain which is likely related to diabetes. Recent workup as above unrevealing otherwise. Titrate Lyrica to 100 mg 3 times a day. Reviewed possible side effects. Consider addition of low-dose Cymbalta if not controlled with the above. Also need to reassess diabetes and tighten up control if not optimal.   Discussed possible addition of GLP-1 medication if indicated   #2 Type 2 diabetes. Repeat A1c. Patient prefers to get with upcoming physical and we'll schedule labs including A1c for the next 1 week   #3 bilateral tinnitus. Recommend audiology assessment. Probable bilateral sensorineural hearing loss

## 2015-11-01 ENCOUNTER — Other Ambulatory Visit (INDEPENDENT_AMBULATORY_CARE_PROVIDER_SITE_OTHER): Payer: 59

## 2015-11-01 DIAGNOSIS — R7989 Other specified abnormal findings of blood chemistry: Secondary | ICD-10-CM

## 2015-11-01 DIAGNOSIS — Z0000 Encounter for general adult medical examination without abnormal findings: Secondary | ICD-10-CM

## 2015-11-01 LAB — CBC WITH DIFFERENTIAL/PLATELET
Basophils Absolute: 0.1 10*3/uL (ref 0.0–0.1)
Basophils Relative: 0.8 % (ref 0.0–3.0)
Eosinophils Absolute: 0.8 10*3/uL — ABNORMAL HIGH (ref 0.0–0.7)
Eosinophils Relative: 8.9 % — ABNORMAL HIGH (ref 0.0–5.0)
HCT: 44.5 % (ref 39.0–52.0)
Hemoglobin: 15.3 g/dL (ref 13.0–17.0)
Lymphocytes Relative: 25.3 % (ref 12.0–46.0)
Lymphs Abs: 2.2 10*3/uL (ref 0.7–4.0)
MCHC: 34.4 g/dL (ref 30.0–36.0)
MCV: 88.8 fl (ref 78.0–100.0)
Monocytes Absolute: 0.9 10*3/uL (ref 0.1–1.0)
Monocytes Relative: 9.8 % (ref 3.0–12.0)
Neutro Abs: 4.9 10*3/uL (ref 1.4–7.7)
Neutrophils Relative %: 55.2 % (ref 43.0–77.0)
Platelets: 230 10*3/uL (ref 150.0–400.0)
RBC: 5.01 Mil/uL (ref 4.22–5.81)
RDW: 13.3 % (ref 11.5–15.5)
WBC: 8.8 10*3/uL (ref 4.0–10.5)

## 2015-11-01 LAB — LIPID PANEL
Cholesterol: 115 mg/dL (ref 0–200)
HDL: 34.5 mg/dL — ABNORMAL LOW (ref >39.00)
NonHDL: 80.11
Total CHOL/HDL Ratio: 3
Triglycerides: 279 mg/dL — ABNORMAL HIGH (ref 0.0–149.0)
VLDL: 55.8 mg/dL — ABNORMAL HIGH (ref 0.0–40.0)

## 2015-11-01 LAB — BASIC METABOLIC PANEL
BUN: 12 mg/dL (ref 6–23)
CO2: 29 mEq/L (ref 19–32)
Calcium: 9 mg/dL (ref 8.4–10.5)
Chloride: 102 mEq/L (ref 96–112)
Creatinine, Ser: 0.82 mg/dL (ref 0.40–1.50)
GFR: 102.27 mL/min (ref >60.00)
Glucose, Bld: 201 mg/dL — ABNORMAL HIGH (ref 70–99)
Potassium: 4.2 mEq/L (ref 3.5–5.1)
Sodium: 138 mEq/L (ref 135–145)

## 2015-11-01 LAB — MICROALBUMIN / CREATININE URINE RATIO
Creatinine,U: 120.8 mg/dL
Microalb Creat Ratio: 1 mg/g (ref 0.0–30.0)
Microalb, Ur: 1.2 mg/dL (ref 0.0–1.9)

## 2015-11-01 LAB — POC URINALSYSI DIPSTICK (AUTOMATED)
Bilirubin, UA: NEGATIVE
Blood, UA: NEGATIVE
Glucose, UA: NEGATIVE
Ketones, UA: NEGATIVE
Leukocytes, UA: NEGATIVE
Nitrite, UA: NEGATIVE
Protein, UA: NEGATIVE
Spec Grav, UA: 1.02
Urobilinogen, UA: 0.2
pH, UA: 6

## 2015-11-01 LAB — HEPATIC FUNCTION PANEL
ALT: 35 U/L (ref 0–53)
AST: 17 U/L (ref 0–37)
Albumin: 4.2 g/dL (ref 3.5–5.2)
Alkaline Phosphatase: 64 U/L (ref 39–117)
Bilirubin, Direct: 0.1 mg/dL (ref 0.0–0.3)
Total Bilirubin: 0.6 mg/dL (ref 0.2–1.2)
Total Protein: 6.6 g/dL (ref 6.0–8.3)

## 2015-11-01 LAB — HEMOGLOBIN A1C: Hgb A1c MFr Bld: 8.1 % — ABNORMAL HIGH (ref 4.6–6.5)

## 2015-11-01 LAB — PSA: PSA: 0.36 ng/mL (ref 0.10–4.00)

## 2015-11-01 LAB — TSH: TSH: 1.05 u[IU]/mL (ref 0.35–4.50)

## 2015-11-01 LAB — LDL CHOLESTEROL, DIRECT: Direct LDL: 50 mg/dL

## 2015-11-08 ENCOUNTER — Ambulatory Visit (INDEPENDENT_AMBULATORY_CARE_PROVIDER_SITE_OTHER): Payer: 59 | Admitting: Family Medicine

## 2015-11-08 ENCOUNTER — Encounter: Payer: Self-pay | Admitting: Family Medicine

## 2015-11-08 VITALS — BP 120/80 | HR 97 | Temp 98.5°F | Ht 74.0 in | Wt 263.0 lb

## 2015-11-08 DIAGNOSIS — Z23 Encounter for immunization: Secondary | ICD-10-CM

## 2015-11-08 DIAGNOSIS — Z0000 Encounter for general adult medical examination without abnormal findings: Secondary | ICD-10-CM

## 2015-11-08 MED ORDER — METFORMIN HCL ER 750 MG PO TB24: ORAL_TABLET | ORAL | Status: DC

## 2015-11-08 NOTE — Progress Notes (Signed)
Subjective:    Patient ID: Eric Palmer, male    DOB: [birth year], 60 y.o.   MRN: [id number]  HPI   Patient here for physical. Chronic problems include history of obesity, atrial fibrillation, type 2 diabetes, ongoing nicotine use , metabolic syndrome , hyperlipidemia , hypertension , diabetic neuropathy, COPD , CAD.  history of poor compliance with diet and exercise.   Tetanus up-to-date. He's had previous Pneumovax. Patient requesting shingles vaccination. No contraindications. No risk factors for hepatitis C.   Diabetes sub-optimal control. Currently only takes metformin 500 mg twice a day. He had intolerance with regular immediate release metformin at higher doses. He's never been on other diabetes medications   still smokes about 5 cigarettes per day  Past Medical History  Diagnosis Date  . Hyperlipidemia   . Coronary artery disease     a. s/p PCI in 1996. b. LHC 01/2006 showed 50% mid LAD, otherwise widely patent cors with minimal irregularities. c. Nuclear stress test in 04/2009 was reportedly low risk, no ischemia.  . Paroxysmal atrial fibrillation ([place])     a. failed Tikosyn, Multaq. b. s/p ablation of AF/AFL in 12/2013.  [name] Internal hemorrhoids without mention of complication   . ED (erectile dysfunction)   . Obesity   . Anal fissure   . Paresthesia of both legs   . Smoker     QUIT 10/13/13  . Hx of echocardiogram     Echo (10/2013): Mild LVH, moderate focal basal hypertrophy of the septum, EF 50-55%, no RWMA  . Chronic bronchitis ([place])     "get it q spring and fall" (10/27/2013)  . OSA on CPAP   . DM type 2, uncontrolled, with neuropathy ([place])   . Paroxysmal atrial flutter ([place])     a. s/p ablation of AF/AFL in 12/2013.  [name] Essential hypertension   . Obesity    Past Surgical History  Procedure Laterality Date  . Tonsillectomy and adenoidectomy  1979  . Inguinal hernia repair Right 1963  . Coronary angioplasty with stent placement  1996    residual 50% LAD and RCA  by cath in 2007  . Cardiac catheterization  2007  . Anal fissure repair  ~ 2010  . Coronary angioplasty with stent placement  1995  . Tee without cardioversion N/A 01/11/2014    Procedure: TRANSESOPHAGEAL ECHOCARDIOGRAM (TEE);  Surgeon: [name], MD;  Location: [facility];  Service: Cardiovascular;  Laterality: N/A;  . Ablation  01-12-2014    PVI and CTI by Dr [name]  . Atrial fibrillation ablation N/A 01/12/2014    Procedure: ATRIAL FIBRILLATION ABLATION;  Surgeon: Coralyn Mark, MD;  Location: [facility] CATH LAB;  Service: Cardiovascular;  Laterality: N/A;    reports that he has quit smoking. His smoking use included Cigarettes. He smoked 0.00 packs per day for 35 years. He has never used smokeless tobacco. He reports that he drinks alcohol. He reports that he does not use illicit drugs. family history includes Heart attack in his father; Heart disease in his other; Heart disease (age of onset: 61) in his father; Heart disease (age of onset: 37) in his mother; Hyperlipidemia in his other; Hypertension in his mother. There is no history of Stroke. Allergies  Allergen Reactions  . Cephalosporins Anaphylaxis  . Penicillins Anaphylaxis  . Codeine Itching  . Gabapentin Other (See Comments)    "made me feel drugged"  . Eliquis [Apixaban]     Diarrhea      Review of Systems  Constitutional: Positive for fatigue. Negative for fever, activity change and appetite change.  HENT: Negative for congestion, ear pain and trouble swallowing.   Eyes: Negative for pain and visual disturbance.  Respiratory: Negative for cough, chest tightness, shortness of breath and wheezing.   Cardiovascular: Negative for chest pain, palpitations and leg swelling.  Gastrointestinal: Negative for nausea, vomiting, abdominal pain, diarrhea, constipation, blood in stool, abdominal distention and rectal pain.  Endocrine: Negative for polydipsia and polyuria.  Genitourinary: Negative for dysuria, hematuria and  testicular pain.  Musculoskeletal: Negative for joint swelling and arthralgias.  Skin: Negative for rash.  Neurological: Negative for dizziness, syncope, weakness, light-headedness and headaches.  Hematological: Negative for adenopathy.  Psychiatric/Behavioral: Negative for confusion and dysphoric mood.       Objective:   Physical Exam  Constitutional: He is oriented to person, place, and time. He appears well-developed and well-nourished. No distress.  HENT:  Head: Normocephalic and atraumatic.  Right Ear: External ear normal.  Left Ear: External ear normal.  Mouth/Throat: Oropharynx is clear and moist.  Eyes: Conjunctivae and EOM are normal. Pupils are equal, round, and reactive to light.  Neck: Normal range of motion. Neck supple. No thyromegaly present.  Cardiovascular: Normal rate, regular rhythm and normal heart sounds.   No murmur heard. Pulmonary/Chest: No respiratory distress. He has no wheezes. He has no rales.  Abdominal: Soft. Bowel sounds are normal. He exhibits no distension and no mass. There is no tenderness. There is no rebound and no guarding.  Musculoskeletal: He exhibits no edema.  Lymphadenopathy:    He has no cervical adenopathy.  Neurological: He is alert and oriented to person, place, and time. He displays normal reflexes. No cranial nerve deficit.  Skin: No rash noted.  Psychiatric: He has a normal mood and affect.          Assessment & Plan:  Physical exam. Labs reviewed. A1c 8.1%. Elevated triglycerides of 279. LDL at goal. Recommend weight loss and quit smoking. Diabetes discussed at some length. We discussed possible addition of S GLT -1 versus GLP-1 medication. We gave another option of increasing his metformin to metformin extended release 750 mg twice a day. He prefers to go this route and work on weight loss and reassess A1c in 3 months.   Shingles vaccine given. He has no contraindications

## 2015-11-08 NOTE — Progress Notes (Signed)
Pre visit review using our clinic review tool, if applicable. No additional management support is needed unless otherwise documented below in the visit note. 

## 2015-11-13 ENCOUNTER — Ambulatory Visit (INDEPENDENT_AMBULATORY_CARE_PROVIDER_SITE_OTHER): Payer: 59 | Admitting: Family Medicine

## 2015-11-13 ENCOUNTER — Encounter: Payer: Self-pay | Admitting: Family Medicine

## 2015-11-13 DIAGNOSIS — J441 Chronic obstructive pulmonary disease with (acute) exacerbation: Secondary | ICD-10-CM

## 2015-11-13 MED ORDER — DOXYCYCLINE HYCLATE 100 MG PO CAPS: 100.0000 mg | ORAL_CAPSULE | Freq: Two times a day (BID) | ORAL | Status: DC

## 2015-11-13 MED ORDER — METHYLPREDNISOLONE ACETATE 80 MG/ML IJ SUSP
80.0000 mg | Freq: Once | INTRAMUSCULAR | Status: AC
  Administered 2015-11-13: 80 mg via INTRAMUSCULAR

## 2015-11-13 MED ORDER — ALBUTEROL SULFATE HFA 108 (90 BASE) MCG/ACT IN AERS: 2.0000 | INHALATION_SPRAY | RESPIRATORY_TRACT | Status: DC | PRN

## 2015-11-13 MED ORDER — TESTOSTERONE CYPIONATE 200 MG/ML IM SOLN: 200.0000 mg | Freq: Once | INTRAMUSCULAR | Status: DC

## 2015-11-13 NOTE — Patient Instructions (Signed)
Follow up promptly for any increased shortness of breath or increasing fever.

## 2015-11-13 NOTE — Progress Notes (Signed)
Subjective:    Patient ID: Eric Palmer, male    DOB: [birth year], 60 y.o.   MRN: [id number]  HPI  Patient has long history of COPD with ongoing nicotine use.  Onset last weekend of low-grade fever around 100 with mostly dry cough.  Some intermittent wheezing. Minimal nasal congestion. No body aches.  Denies any headache or sore throat.  Uses Advair at baseline. Using pro-air as needed.  Mild dyspnea with exertion. No chest pains.  He is not aware of any fever over the past 24 hours.  no dyspnea at rest.  Past Medical History  Diagnosis Date  . Hyperlipidemia   . Coronary artery disease     a. s/p PCI in 1996. b. LHC 01/2006 showed 50% mid LAD, otherwise widely patent cors with minimal irregularities. c. Nuclear stress test in 04/2009 was reportedly low risk, no ischemia.  . Paroxysmal atrial fibrillation ([place])     a. failed Tikosyn, Multaq. b. s/p ablation of AF/AFL in 12/2013.  [name] Internal hemorrhoids without mention of complication   . ED (erectile dysfunction)   . Obesity   . Anal fissure   . Paresthesia of both legs   . Smoker     QUIT 10/13/13  . Hx of echocardiogram     Echo (10/2013): Mild LVH, moderate focal basal hypertrophy of the septum, EF 50-55%, no RWMA  . Chronic bronchitis ([place])     "get it q spring and fall" (10/27/2013)  . OSA on CPAP   . DM type 2, uncontrolled, with neuropathy ([place])   . Paroxysmal atrial flutter ([place])     a. s/p ablation of AF/AFL in 12/2013.  [name] Essential hypertension   . Obesity    Past Surgical History  Procedure Laterality Date  . Tonsillectomy and adenoidectomy  1979  . Inguinal hernia repair Right 1963  . Coronary angioplasty with stent placement  1996    residual 50% LAD and RCA by cath in 2007  . Cardiac catheterization  2007  . Anal fissure repair  ~ 2010  . Coronary angioplasty with stent placement  1995  . Tee without cardioversion N/A 01/11/2014    Procedure: TRANSESOPHAGEAL ECHOCARDIOGRAM (TEE);  Surgeon: [name],  MD;  Location: [facility];  Service: Cardiovascular;  Laterality: N/A;  . Ablation  01-12-2014    PVI and CTI by Dr [name]  . Atrial fibrillation ablation N/A 01/12/2014    Procedure: ATRIAL FIBRILLATION ABLATION;  Surgeon: Coralyn Mark, MD;  Location: [facility] CATH LAB;  Service: Cardiovascular;  Laterality: N/A;    reports that he has quit smoking. His smoking use included Cigarettes. He smoked 0.00 packs per day for 35 years. He has never used smokeless tobacco. He reports that he drinks alcohol. He reports that he does not use illicit drugs. family history includes Heart attack in his father; Heart disease in his other; Heart disease (age of onset: 62) in his father; Heart disease (age of onset: 75) in his mother; Hyperlipidemia in his other; Hypertension in his mother. There is no history of Stroke. Allergies  Allergen Reactions  . Cephalosporins Anaphylaxis  . Penicillins Anaphylaxis  . Codeine Itching  . Gabapentin Other (See Comments)    "made me feel drugged"  . Eliquis [Apixaban]     Diarrhea      Review of Systems  Constitutional: Positive for fatigue. Negative for chills.  HENT: Negative for congestion.   Respiratory: Positive for cough and wheezing.   Cardiovascular: Negative for chest pain.  Objective:   Physical Exam  Constitutional: He appears well-developed and well-nourished.  HENT:  Right Ear: External ear normal.  Left Ear: External ear normal.  Mouth/Throat: Oropharynx is clear and moist.  Neck: Neck supple.  Cardiovascular: Normal rate and regular rhythm.   Pulmonary/Chest: Effort normal. He has wheezes. He has no rales.  He has some faint expiratory wheezes. Normal respiratory rate. No respiratory distress.  Lymphadenopathy:    He has no cervical adenopathy.          Assessment & Plan:   Acute exacerbation of COPD. Depo-Medrol 80 mg IM given. Doxycycline 100 mg twice a day for 10 days. Continue Advair and pro-air as needed. He is encouraged to  stop smoking.

## 2015-11-13 NOTE — Addendum Note (Signed)
Addended by: Elio Forget on: 11/13/2015 05:31 PM   Modules accepted: Orders

## 2015-12-02 ENCOUNTER — Other Ambulatory Visit: Payer: Self-pay | Admitting: Family Medicine

## 2015-12-04 ENCOUNTER — Encounter: Payer: Self-pay | Admitting: Interventional Cardiology

## 2015-12-04 ENCOUNTER — Ambulatory Visit (INDEPENDENT_AMBULATORY_CARE_PROVIDER_SITE_OTHER): Payer: 59 | Admitting: Interventional Cardiology

## 2015-12-04 VITALS — BP 118/68 | HR 90 | Ht 74.0 in | Wt 259.0 lb

## 2015-12-04 DIAGNOSIS — G4733 Obstructive sleep apnea (adult) (pediatric): Secondary | ICD-10-CM

## 2015-12-04 DIAGNOSIS — E785 Hyperlipidemia, unspecified: Secondary | ICD-10-CM | POA: Diagnosis not present

## 2015-12-04 DIAGNOSIS — I10 Essential (primary) hypertension: Secondary | ICD-10-CM

## 2015-12-04 DIAGNOSIS — I2510 Atherosclerotic heart disease of native coronary artery without angina pectoris: Secondary | ICD-10-CM | POA: Diagnosis not present

## 2015-12-04 DIAGNOSIS — I480 Paroxysmal atrial fibrillation: Secondary | ICD-10-CM | POA: Diagnosis not present

## 2015-12-04 DIAGNOSIS — E119 Type 2 diabetes mellitus without complications: Secondary | ICD-10-CM

## 2015-12-04 MED ORDER — RIVAROXABAN 20 MG PO TABS: 20.0000 mg | ORAL_TABLET | Freq: Every day | ORAL | Status: DC

## 2015-12-04 MED ORDER — ATORVASTATIN CALCIUM 40 MG PO TABS: 40.0000 mg | ORAL_TABLET | Freq: Every day | ORAL | Status: DC

## 2015-12-04 MED ORDER — DILTIAZEM HCL ER COATED BEADS 240 MG PO CP24: 240.0000 mg | ORAL_CAPSULE | Freq: Every day | ORAL | Status: DC

## 2015-12-04 MED ORDER — SOTALOL HCL 80 MG PO TABS: 80.0000 mg | ORAL_TABLET | Freq: Two times a day (BID) | ORAL | Status: DC

## 2015-12-04 NOTE — Progress Notes (Signed)
Cardiology Office Note   Date:  12/04/2015   ID:  Eric Palmer, DOB 1956/07/21, MRN [id number]  PCP:  Eric Post, MD  Cardiologist:  [name], MD   Chief Complaint  Patient presents with  . Coronary Artery Disease      History of Present Illness: Eric Palmer is a 60 y.o. male who presents for CAD RCA stent, diabetes mellitus type 2 with poor control, hyperlipidemia, paroxysmal atrial flutter, and essential hypertension.  No angina. Not exercising. Most recent A1c 8.1. Gaining weight. Denies nitroglycerin use. No exertional dyspnea or chest pain. Takes medications as prescribed. Having a difficult time curbing appetite.  Past Medical History  Diagnosis Date  . Hyperlipidemia   . Coronary artery disease     a. s/p PCI in 1996. b. LHC 01/2006 showed 50% mid LAD, otherwise widely patent cors with minimal irregularities. c. Nuclear stress test in 04/2009 was reportedly low risk, no ischemia.  . Paroxysmal atrial fibrillation ([place])     a. failed Tikosyn, Multaq. b. s/p ablation of AF/AFL in 12/2013.  [name] Internal hemorrhoids without mention of complication   . ED (erectile dysfunction)   . Obesity   . Anal fissure   . Paresthesia of both legs   . Smoker     QUIT 10/13/13  . Hx of echocardiogram     Echo (10/2013): Mild LVH, moderate focal basal hypertrophy of the septum, EF 50-55%, no RWMA  . Chronic bronchitis ([place])     "get it q spring and fall" (10/27/2013)  . OSA on CPAP   . DM type 2, uncontrolled, with neuropathy ([place])   . Paroxysmal atrial flutter ([place])     a. s/p ablation of AF/AFL in 12/2013.  [name] Essential hypertension   . Obesity     Past Surgical History  Procedure Laterality Date  . Tonsillectomy and adenoidectomy  1979  . Inguinal hernia repair Right 1963  . Coronary angioplasty with stent placement  1996    residual 50% LAD and RCA by cath in 2007  . Cardiac catheterization  2007  . Anal fissure repair  ~ 2010  . Coronary angioplasty  with stent placement  1995  . Tee without cardioversion N/A 01/11/2014    Procedure: TRANSESOPHAGEAL ECHOCARDIOGRAM (TEE);  Surgeon: [name], MD;  Location: [facility];  Service: Cardiovascular;  Laterality: N/A;  . Ablation  01-12-2014    PVI and CTI by Dr [name]  . Atrial fibrillation ablation N/A 01/12/2014    Procedure: ATRIAL FIBRILLATION ABLATION;  Surgeon: Coralyn Mark, MD;  Location: [facility];  Service: Cardiovascular;  Laterality: N/A;     Current Outpatient Prescriptions  Medication Sig Dispense Refill  . ADVAIR DISKUS 100-50 MCG/DOSE AEPB INHALE 1 PUFF INTO THE LUNGS TWICE DAILY 60 each 2  . albuterol (PROAIR HFA) 108 (90 Base) MCG/ACT inhaler Inhale 2 puffs into the lungs every 4 (four) hours as needed for wheezing or shortness of breath. 8.5 g 5  . atorvastatin (LIPITOR) 40 MG tablet Take 1 tablet (40 mg total) by mouth daily at 6 PM. 30 tablet 11  . diltiazem (CARTIA XT) 240 MG 24 hr capsule Take 1 capsule (240 mg total) by mouth daily. 180 capsule 3  . fish oil-omega-3 fatty acids 1000 MG capsule Take 1 g by mouth 2 (two) times daily.     [name] glucose blood (ONETOUCH VERIO) test strip Check one time daily. DX: 250.00 100 each 3  . metFORMIN (GLUCOPHAGE XR)  750 MG 24 hr tablet Take one tablet twice daily 180 tablet 3  . ONETOUCH DELICA LANCETS FINE MISC Check one time daily. DX: 250.00 100 each 3  . pregabalin (LYRICA) 100 MG capsule Take 100 mg by mouth 3 (three) times daily.    . rivaroxaban (XARELTO) 20 MG TABS tablet Take 1 tablet (20 mg total) by mouth daily with supper. 30 tablet 11  . sotalol (BETAPACE) 80 MG tablet Take 80 mg by mouth 2 (two) times daily.     No current facility-administered medications for this visit.    Allergies:   Cephalosporins; Penicillins; Codeine; Gabapentin; and Eliquis    Social History:  The patient  reports that he has quit smoking. His smoking use included Cigarettes. He smoked 0.00 packs per day for 35 years. He has never  used smokeless tobacco. He reports that he drinks alcohol. He reports that he does not use illicit drugs.   Family History:  The patient's family history includes Heart attack in his father; Heart disease in his other; Heart disease (age of onset: 27) in his father; Heart disease (age of onset: 15) in his mother; Hyperlipidemia in his other; Hypertension in his mother. There is no history of Stroke.    ROS:  Please see the history of present illness.   Otherwise, review of systems are positive for difficulty controlling her sugars. Paresthesias in feet..   All other systems are reviewed and negative.    PHYSICAL EXAM: VS:  BP 118/68 mmHg  Pulse 90  Ht 6\' 2"  (1.88 m)  Wt 259 lb (117.482 kg)  BMI 33.24 kg/m2  SpO2 96% , BMI Body mass index is 33.24 kg/(m^2). GEN: Well nourished, well developed, in no acute distress HEENT: normal Neck: no JVD, carotid bruits, or masses Cardiac: RRR.  There is no murmur, rub, or gallop. There is no edema. Respiratory:  clear to auscultation bilaterally, normal work of breathing. GI: soft, nontender, nondistended, + BS MS: no deformity or atrophy Skin: warm and dry, no rash Neuro:  Strength and sensation are intact Psych: euthymic mood, full affect   EKG:  EKG is no ordered today.   Recent Labs: 02/26/2015: Magnesium 1.9 11/01/2015: ALT 35; BUN 12; Creatinine, Ser 0.82; Hemoglobin 15.3; Platelets 230.0; Potassium 4.2; Sodium 138; TSH 1.05    Lipid Panel    Component Value Date/Time   CHOL 115 11/01/2015 0757   TRIG 279.0* 11/01/2015 0757   HDL 34.50* 11/01/2015 0757   CHOLHDL 3 11/01/2015 0757   VLDL 55.8* 11/01/2015 0757   LDLCALC 46 01/27/2013 0837   LDLDIRECT 50.0 11/01/2015 0757      Wt Readings from Last 3 Encounters:  12/04/15 259 lb (117.482 kg)  11/13/15 263 lb (119.296 kg)  11/08/15 263 lb (119.296 kg)      Other studies Reviewed: Additional studies/ records that were reviewed today include: Reviewed laboratory data from recen  bloodwork done by Dr. Elease Hashimoto.  The findings include A1c 8.1, LDL 50, creatinine 0.82,.    ASSESSMENT AND PLAN:  1. Essential hypertension Controlled  2. Atherosclerosis of native coronary artery of native heart without angina pectoris No current anginal complaints  3. Dyslipidemia Lipid panel is relatively well controlled with an LDL not calculated due to elevated triglyceride. Total cholesterol 1:15. HDL 35.  4. Paroxysmal atrial fibrillation ([name]) Suppressed with sotalol by and large. Has had rare episodes in and around since last being seen. - rivaroxaban (XARELTO) 20 MG TABS tablet; Take 1 tablet (20 mg total) by  mouth daily with supper.  Dispense: 90 tablet; Refill: 3  5. Obstructive sleep apnea Wears to see Pap  6. Type 2 diabetes mellitus without complication, unspecified long term insulin use status ([place]) This is his big problem and is poorly controlled.    Current medicines are reviewed at length with the patient today.  The patient has the following concerns regarding medicines: Concerned about his blood sugars and states that he is frustrated by continued obesity and poor A1c control..  The following changes/actions have been instituted:    Increase exercise to help better control weight and blood sugars.  Encouraged medication compliance  Call if angina pectoris  Labs/ tests ordered today include:  No orders of the defined types were placed in this encounter.     Disposition:   FU with HS in 9 months  Signed, [name], MD  12/04/2015 3:55 PM    [address], [place], [place]  [address] Phone: [phone]; Fax: [phone]

## 2015-12-04 NOTE — Patient Instructions (Signed)
Medication Instructions:  Your physician recommends that you continue on your current medications as directed. Please refer to the Current Medication list given to you today.   Labwork: None ordered  Testing/Procedures: None ordered  Follow-Up: Your physician wants you to follow-up in: 8 months You will receive a reminder letter in the mail two months in advance. If you don't receive a letter, please call our office to schedule the follow-up appointment.   Any Other Special Instructions Will Be Listed Below (If Applicable).     If you need a refill on your cardiac medications before your next appointment, please call your pharmacy.

## 2015-12-09 ENCOUNTER — Telehealth: Payer: Self-pay | Admitting: Family Medicine

## 2015-12-09 MED ORDER — PREGABALIN 100 MG PO CAPS: ORAL_CAPSULE | ORAL | Status: DC

## 2015-12-09 NOTE — Telephone Encounter (Signed)
Verbally called in for pt at the correct dose.

## 2015-12-09 NOTE — Telephone Encounter (Signed)
Would not titrate up any further..  May refill for 4 a day and would send in exactly how he is taking (one po in morning, one at lunch, and two at night) and not QID.  Ok to refill for 6 months.

## 2015-12-09 NOTE — Telephone Encounter (Signed)
Per medication list--pt is to take TID. Okay to increase to qid?

## 2015-12-09 NOTE — Telephone Encounter (Signed)
Pt request refill of the following: pregabalin (LYRICA) 100 MG capsule  Pt said he has been taking 1 in the morning 1 at lunch and 2 in the evening so he ran out of the rx. Pharmacy will not fill until 12/14/15 and pt said he is out of the medicine    Pt said the pharmacy has the previous instructions which say take 3 times a day    Phamacy:  [facility]

## 2016-01-04 ENCOUNTER — Other Ambulatory Visit: Payer: Self-pay | Admitting: Cardiology

## 2016-02-01 ENCOUNTER — Other Ambulatory Visit: Payer: Self-pay | Admitting: Family Medicine

## 2016-02-03 ENCOUNTER — Other Ambulatory Visit: Payer: Self-pay | Admitting: Family Medicine

## 2016-02-03 NOTE — Telephone Encounter (Signed)
Rx refill sent to pharmacy. 

## 2016-02-10 ENCOUNTER — Encounter: Payer: Self-pay | Admitting: Family Medicine

## 2016-02-10 ENCOUNTER — Ambulatory Visit (INDEPENDENT_AMBULATORY_CARE_PROVIDER_SITE_OTHER): Payer: 59 | Admitting: Family Medicine

## 2016-02-10 VITALS — BP 110/80 | HR 81 | Temp 98.1°F | Ht 74.0 in | Wt 261.0 lb

## 2016-02-10 DIAGNOSIS — W57XXXA Bitten or stung by nonvenomous insect and other nonvenomous arthropods, initial encounter: Secondary | ICD-10-CM | POA: Diagnosis not present

## 2016-02-10 DIAGNOSIS — T148 Other injury of unspecified body region: Secondary | ICD-10-CM

## 2016-02-10 DIAGNOSIS — E1142 Type 2 diabetes mellitus with diabetic polyneuropathy: Secondary | ICD-10-CM

## 2016-02-10 LAB — POCT GLYCOSYLATED HEMOGLOBIN (HGB A1C): Hemoglobin A1C: 7.3

## 2016-02-10 NOTE — Patient Instructions (Signed)
Your A1C was 7.3% (was 8.1%) so you are making some progress. Continue with weight loss efforts.

## 2016-02-10 NOTE — Progress Notes (Signed)
Subjective:    Patient ID: Eric Palmer, male    DOB: 04-11-1956, 60 y.o.   MRN: [id number]  HPI Patient seen for follow multiple items  Type 2 diabetes. History of poor control. Last A1c 8.1%. Has made some dietary adjustments. We also increased his metformin. He's lost 2 pounds. Fasting blood sugars usually around 100 No polyuria or polydipsia  Peripheral neuropathy probably related to his diabetes. Still has breakthrough symptoms even with Lyrica. Sleeping fairly well. Reluctant to go up on medication at this time  Recent tick bite left posterior leg region new the knee. Occurred about 10 days ago. He's had some persistent itching. No other rash. No headache. No arthralgias. No fevers or chills. He thinks the tick was removed in entirety  Lab Results  Component Value Date   HGBA1C 8.1* 11/01/2015     Past Medical History  Diagnosis Date  . Hyperlipidemia   . Coronary artery disease     a. s/p PCI in 1996. b. LHC 01/2006 showed 50% mid LAD, otherwise widely patent cors with minimal irregularities. c. Nuclear stress test in 04/2009 was reportedly low risk, no ischemia.  . Paroxysmal atrial fibrillation ([place])     a. failed Tikosyn, Multaq. b. s/p ablation of AF/AFL in 12/2013.  [name] Internal hemorrhoids without mention of complication   . ED (erectile dysfunction)   . Obesity   . Anal fissure   . Paresthesia of both legs   . Smoker     QUIT 10/13/13  . Hx of echocardiogram     Echo (10/2013): Mild LVH, moderate focal basal hypertrophy of the septum, EF 50-55%, no RWMA  . Chronic bronchitis ([place])     "get it q spring and fall" (10/27/2013)  . OSA on CPAP   . DM type 2, uncontrolled, with neuropathy ([place])   . Paroxysmal atrial flutter ([place])     a. s/p ablation of AF/AFL in 12/2013.  [name] Essential hypertension   . Obesity    Past Surgical History  Procedure Laterality Date  . Tonsillectomy and adenoidectomy  1979  . Inguinal hernia repair Right 1963  . Coronary angioplasty  with stent placement  1996    residual 50% LAD and RCA by cath in 2007  . Cardiac catheterization  2007  . Anal fissure repair  ~ 2010  . Coronary angioplasty with stent placement  1995  . Tee without cardioversion N/A 01/11/2014    Procedure: TRANSESOPHAGEAL ECHOCARDIOGRAM (TEE);  Surgeon: [name], MD;  Location: [facility];  Service: Cardiovascular;  Laterality: N/A;  . Ablation  01-12-2014    PVI and CTI by Dr [name]  . Atrial fibrillation ablation N/A 01/12/2014    Procedure: ATRIAL FIBRILLATION ABLATION;  Surgeon: Coralyn Mark, MD;  Location: [facility]ATH [facility];  Service: Cardiovascular;  Laterality: N/A;    reports that he has quit smoking. His smoking use included Cigarettes. He smoked 0.00 packs per day for 35 years. He has never used smokeless tobacco. He reports that he drinks alcohol. He reports that he does not use illicit drugs. family history includes Heart attack in his father; Heart disease in his other; Heart disease (age of onset: 80) in his father; Heart disease (age of onset: 57) in his mother; Hyperlipidemia in his other; Hypertension in his mother. There is no history of Stroke. Allergies  Allergen Reactions  . Cephalosporins Anaphylaxis  . Penicillins Anaphylaxis  . Codeine Itching  . Gabapentin Other (See Comments)    "made me  feel drugged"  . Eliquis [Apixaban]     Diarrhea      Review of Systems  Constitutional: Negative for appetite change, fatigue and unexpected weight change.  Eyes: Negative for visual disturbance.  Respiratory: Negative for cough, chest tightness and shortness of breath.   Cardiovascular: Negative for chest pain, palpitations and leg swelling.  Endocrine: Negative for polydipsia and polyuria.  Neurological: Negative for dizziness, syncope, weakness, light-headedness and headaches.       Objective:   Physical Exam  Constitutional: He appears well-developed and well-nourished.  Neck: Neck supple. No thyromegaly present.    Cardiovascular: Normal rate and regular rhythm.   Pulmonary/Chest: Effort normal and breath sounds normal. No respiratory distress. He has no wheezes. He has no rales.  Musculoskeletal: He exhibits no edema.  Skin:  Very small minimally erythematous papule left posterior leg proximally. No pustule. Nontender. No visible foreign body. No surrounding erythema          Assessment & Plan:  #1 type 2 diabetes. History of poor control. Recheck A1c. Consider addition of GLP-1 medication if still elevated. Continue weight loss efforts   A1C 7.3%  Repeat in 3 months  #2 tick bite left leg. Local allergic reaction. Reassurance. Follow-up for any fever or new rash  #3 diabetic polyneuropathy. Continue Lyrica. We discussed possible addition of Cymbalta if symptoms persist or worsen but at this point he does not wish to add additional medication  [name] MD [facility]

## 2016-02-10 NOTE — Progress Notes (Signed)
Pre visit review using our clinic review tool, if applicable. No additional management support is needed unless otherwise documented below in the visit note. 

## 2016-03-16 ENCOUNTER — Encounter: Payer: Self-pay | Admitting: Family Medicine

## 2016-03-16 ENCOUNTER — Ambulatory Visit (INDEPENDENT_AMBULATORY_CARE_PROVIDER_SITE_OTHER): Payer: 59 | Admitting: Family Medicine

## 2016-03-16 VITALS — BP 100/80 | HR 81 | Temp 99.3°F | Ht 74.0 in | Wt 261.0 lb

## 2016-03-16 DIAGNOSIS — K115 Sialolithiasis: Secondary | ICD-10-CM | POA: Diagnosis not present

## 2016-03-16 NOTE — Progress Notes (Signed)
Pre visit review using our clinic review tool, if applicable. No additional management support is needed unless otherwise documented below in the visit note. 

## 2016-03-16 NOTE — Patient Instructions (Signed)
-  stay well hydrated -consider warm compresses to area several times daily -follow up for any fever, increased redness, or if symptoms not resolving over the next 1-2 weeks -suspect submandibular stone or sludge impeding drainage.

## 2016-03-16 NOTE — Progress Notes (Signed)
Subjective:     Patient ID: Eric Palmer, male   DOB: [birth year], 60 y.o.   MRN: [id number]  HPI Acute visit for right submandibular swelling. Onset yesterday. He noted some mild pain. He thought there may have been some mild redness yesterday but none today. No definite fever. Minimal sore throat. No difficulty swallowing.  Past Medical History:  Diagnosis Date  . Anal fissure   . Chronic bronchitis ([place])    "get it q spring and fall" (10/27/2013)  . Coronary artery disease    a. s/p PCI in 1996. b. LHC 01/2006 showed 50% mid LAD, otherwise widely patent cors with minimal irregularities. c. Nuclear stress test in 04/2009 was reportedly low risk, no ischemia.  . DM type 2, uncontrolled, with neuropathy ([place])   . ED (erectile dysfunction)   . Essential hypertension   . Hx of echocardiogram    Echo (10/2013): Mild LVH, moderate focal basal hypertrophy of the septum, EF 50-55%, no RWMA  . Hyperlipidemia   . Internal hemorrhoids without mention of complication   . Obesity   . Obesity   . OSA on CPAP   . Paresthesia of both legs   . Paroxysmal atrial fibrillation ([name])    a. failed Tikosyn, Multaq. b. s/p ablation of AF/AFL in 12/2013.  [name] Paroxysmal atrial flutter ([place])    a. s/p ablation of AF/AFL in 12/2013.  [name] Smoker    QUIT 10/13/13   Past Surgical History:  Procedure Laterality Date  . ABLATION  01-12-2014   PVI and CTI by Dr [name]  . ANAL FISSURE REPAIR  ~ 2010  . ATRIAL FIBRILLATION ABLATION N/A 01/12/2014   Procedure: ATRIAL FIBRILLATION ABLATION;  Surgeon: Coralyn Mark, MD;  Location: [facility]ATH [facility];  Service: Cardiovascular;  Laterality: N/A;  . CARDIAC CATHETERIZATION  2007  . CORONARY ANGIOPLASTY WITH STENT PLACEMENT  1996   residual 50% LAD and RCA by cath in 2007  . CORONARY ANGIOPLASTY WITH STENT PLACEMENT  1995  . INGUINAL HERNIA REPAIR Right 1963  . TEE WITHOUT CARDIOVERSION N/A 01/11/2014   Procedure: TRANSESOPHAGEAL ECHOCARDIOGRAM (TEE);  Surgeon: [name],  MD;  Location: [facility];  Service: Cardiovascular;  Laterality: N/A;  . [place]    reports that he has quit smoking. His smoking use included Cigarettes. He smoked 0.00 packs per day for 35.00 years. He has never used smokeless tobacco. He reports that he drinks alcohol. He reports that he does not use drugs. family history includes Heart attack in his father; Heart disease in his other; Heart disease (age of onset: 88) in his father; Heart disease (age of onset: 5) in his mother; Hyperlipidemia in his other; Hypertension in his mother. Allergies  Allergen Reactions  . Cephalosporins Anaphylaxis  . Penicillins Anaphylaxis  . Codeine Itching  . Gabapentin Other (See Comments)    "made me feel drugged"  . Eliquis [Apixaban]     Diarrhea     Review of Systems  Constitutional: Negative for chills and fever.  HENT: Negative for trouble swallowing.        Objective:   Physical Exam  Constitutional: He appears well-developed and well-nourished.  HENT:  Right Ear: External ear normal.  Left Ear: External ear normal.  Mouth/Throat: No oropharyngeal exudate.  Palpated sublingual area and floor the mouth and no palpated stones or masses  Neck: Neck supple. No thyromegaly present.  Patient has some mild swelling of his right submandibular gland. No significant adenopathy noted.  Assessment:     Right submandibular gland swelling. Suspect related to Sialolithiasis. No evidence for obvious infection at this time    Plan:     -Stay well-hydrated. -Use warm compresses several times daily. -Follow-up immediately for any signs of infection such as redness, increased warmth, or fever -Consider ENT referral if not resolving over the next couple weeks  [name] MD [facility]

## 2016-04-26 ENCOUNTER — Other Ambulatory Visit: Payer: Self-pay | Admitting: Family Medicine

## 2016-04-29 NOTE — Telephone Encounter (Signed)
Rx refill sent to pharmacy. 

## 2016-05-12 ENCOUNTER — Encounter: Payer: Self-pay | Admitting: Family Medicine

## 2016-05-12 ENCOUNTER — Ambulatory Visit (INDEPENDENT_AMBULATORY_CARE_PROVIDER_SITE_OTHER): Payer: 59 | Admitting: Family Medicine

## 2016-05-12 VITALS — BP 100/68 | HR 80 | Temp 98.2°F | Ht 74.0 in | Wt 263.0 lb

## 2016-05-12 DIAGNOSIS — I10 Essential (primary) hypertension: Secondary | ICD-10-CM | POA: Diagnosis not present

## 2016-05-12 DIAGNOSIS — E785 Hyperlipidemia, unspecified: Secondary | ICD-10-CM | POA: Diagnosis not present

## 2016-05-12 DIAGNOSIS — E1142 Type 2 diabetes mellitus with diabetic polyneuropathy: Secondary | ICD-10-CM | POA: Diagnosis not present

## 2016-05-12 LAB — POCT GLYCOSYLATED HEMOGLOBIN (HGB A1C): Hemoglobin A1C: 7.5

## 2016-05-12 NOTE — Progress Notes (Signed)
Subjective:     Patient ID: Eric Palmer, male   DOB: [birth year], 60 y.o.   MRN: [id number]  HPI Medical follow-up.  Type 2 diabetes. Currently takes metformin extended release but no other diabetes medications. Last A1c 7.3%. He was reluctant to add additional medications. No polyuria or polydipsia. Weight is unchanged  Diabetic peripheral neuropathy. Still has some burning sensation in his feet. Currently takes fairly high-dose Lyrica. We discussed Cymbalta addition last visit he was reluctant. He has no weakness. Previous B12 and thyroid function testing along with serum protein electrophoresis all normal  Hypertension well controlled. No chest pains. No dizziness. On atorvastatin for hyperlipidemia. Plans to get flu vaccine through his work  Past Medical History:  Diagnosis Date  . Anal fissure   . Chronic bronchitis ([place])    "get it q spring and fall" (10/27/2013)  . Coronary artery disease    a. s/p PCI in 1996. b. LHC 01/2006 showed 50% mid LAD, otherwise widely patent cors with minimal irregularities. c. Nuclear stress test in 04/2009 was reportedly low risk, no ischemia.  . DM type 2, uncontrolled, with neuropathy ([place])   . ED (erectile dysfunction)   . Essential hypertension   . Hx of echocardiogram    Echo (10/2013): Mild LVH, moderate focal basal hypertrophy of the septum, EF 50-55%, no RWMA  . Hyperlipidemia   . Internal hemorrhoids without mention of complication   . Obesity   . Obesity   . OSA on CPAP   . Paresthesia of both legs   . Paroxysmal atrial fibrillation ([name])    a. failed Tikosyn, Multaq. b. s/p ablation of AF/AFL in 12/2013.  [name] Paroxysmal atrial flutter ([place])    a. s/p ablation of AF/AFL in 12/2013.  [name] Smoker    QUIT 10/13/13   Past Surgical History:  Procedure Laterality Date  . ABLATION  01-12-2014   PVI and CTI by Dr [name]  . ANAL FISSURE REPAIR  ~ 2010  . ATRIAL FIBRILLATION ABLATION N/A 01/12/2014   Procedure: ATRIAL FIBRILLATION ABLATION;   Surgeon: Coralyn Mark, MD;  Location: [facility] CATH [facility];  Service: Cardiovascular;  Laterality: N/A;  . CARDIAC CATHETERIZATION  2007  . CORONARY ANGIOPLASTY WITH STENT PLACEMENT  1996   residual 50% LAD and RCA by cath in 2007  . CORONARY ANGIOPLASTY WITH STENT PLACEMENT  1995  . INGUINAL HERNIA REPAIR Right 1963  . TEE WITHOUT CARDIOVERSION N/A 01/11/2014   Procedure: TRANSESOPHAGEAL ECHOCARDIOGRAM (TEE);  Surgeon: [name], MD;  Location: [facility];  Service: Cardiovascular;  Laterality: N/A;  . [place]    reports that he has quit smoking. His smoking use included Cigarettes. He smoked 0.00 packs per day for 35.00 years. He has never used smokeless tobacco. He reports that he drinks alcohol. He reports that he does not use drugs. family history includes Heart attack in his father; Heart disease in his other; Heart disease (age of onset: 44) in his father; Heart disease (age of onset: 42) in his mother; Hyperlipidemia in his other; Hypertension in his mother. Allergies  Allergen Reactions  . Cephalosporins Anaphylaxis  . Penicillins Anaphylaxis  . Codeine Itching  . Gabapentin Other (See Comments)    "made me feel drugged"  . Eliquis [Apixaban]     Diarrhea     Review of Systems  Constitutional: Negative for appetite change and unexpected weight change.  Eyes: Negative for visual disturbance.  Respiratory: Negative for cough, chest tightness and shortness of  breath.   Cardiovascular: Negative for chest pain, palpitations and leg swelling.  Neurological: Negative for dizziness, syncope, weakness, light-headedness and headaches.       Objective:   Physical Exam  Constitutional: He is oriented to person, place, and time. He appears well-developed and well-nourished.  HENT:  Right Ear: External ear normal.  Left Ear: External ear normal.  Mouth/Throat: Oropharynx is clear and moist.  Eyes: Pupils are equal, round, and reactive to light.  Neck:  Neck supple. No thyromegaly present.  Cardiovascular: Normal rate and regular rhythm.   Pulmonary/Chest: Effort normal and breath sounds normal. No respiratory distress. He has no wheezes. He has no rales.  Musculoskeletal: He exhibits no edema.  Neurological: He is alert and oriented to person, place, and time.       Assessment:     #1 type 2 diabetes. History of improving but suboptimal control  #2 diabetic peripheral neuropathy  #3 hypertension stable and at goal  #4 history of atrial fibrillation on chronic anticoagulation  #5 dyslipidemia    Plan:     -check hemoglobin A1c-7.5. We had long discussion with patient and he is adamant that he would like to lose some weight and recheck in 3 months.  If not to goal at that point consider additional medication -Offered flu vaccine because he plans to get through work -Strongly advised to lose some weight -Discussed possible addition of Cymbalta for neuropathy but he wishes to wait  Eric Post MD [facility] at [facility]

## 2016-05-12 NOTE — Progress Notes (Signed)
Pre visit review using our clinic review tool, if applicable. No additional management support is needed unless otherwise documented below in the visit note. 

## 2016-07-04 ENCOUNTER — Other Ambulatory Visit: Payer: Self-pay | Admitting: Family Medicine

## 2016-08-07 ENCOUNTER — Other Ambulatory Visit: Payer: Self-pay | Admitting: Family Medicine

## 2016-08-11 ENCOUNTER — Ambulatory Visit: Payer: 59 | Admitting: Family Medicine

## 2016-08-11 ENCOUNTER — Other Ambulatory Visit: Payer: Self-pay | Admitting: Emergency Medicine

## 2016-08-20 ENCOUNTER — Telehealth: Payer: Self-pay | Admitting: Interventional Cardiology

## 2016-08-20 NOTE — Telephone Encounter (Signed)
Pt states that he has had some SOB that comes and goes for the past 3-4 days.  States this occurs whether he is moving around or sitting still.  Has some chest tightness occasionally.  Denies lightheadedness, dizziness, HA, N/V or other symptoms.  BP last night was 148/101.  BP this AM was 127/90, HR 92.  Pt states he may be in Afib.  Advised pt to keep appt with Cecilie Kicks, NP tomorrow.  Also advised if symptoms worsen to call back or to report to ER for eval.  Pt verbalized understanding and was appreciative for assistance.

## 2016-08-20 NOTE — Telephone Encounter (Signed)
New Message   Pt c/o Shortness Of Breath: STAT if SOB developed within the last 24 hours or pt is noticeably SOB on the phone  1. Are you currently SOB (can you hear that pt is SOB on the phone)? No  2. How long have you been experiencing SOB? 3-4days  3. Are you SOB when sitting or when up moving around? Yes. Comes & goes  4. Are you currently experiencing any other symptoms? Biggest problem is Afib making it uncomfortable, tightness in chest/Elevated BP  Appointment made with Cecilie Kicks for tomorrow

## 2016-08-21 ENCOUNTER — Other Ambulatory Visit: Payer: Self-pay | Admitting: Nurse Practitioner

## 2016-08-21 ENCOUNTER — Ambulatory Visit (INDEPENDENT_AMBULATORY_CARE_PROVIDER_SITE_OTHER): Payer: 59 | Admitting: Cardiology

## 2016-08-21 ENCOUNTER — Telehealth: Payer: Self-pay | Admitting: Nurse Practitioner

## 2016-08-21 ENCOUNTER — Encounter: Payer: Self-pay | Admitting: Cardiology

## 2016-08-21 VITALS — BP 138/86 | HR 83 | Ht 74.0 in | Wt 263.6 lb

## 2016-08-21 DIAGNOSIS — I2510 Atherosclerotic heart disease of native coronary artery without angina pectoris: Secondary | ICD-10-CM

## 2016-08-21 DIAGNOSIS — R0789 Other chest pain: Secondary | ICD-10-CM

## 2016-08-21 DIAGNOSIS — I10 Essential (primary) hypertension: Secondary | ICD-10-CM

## 2016-08-21 DIAGNOSIS — G4733 Obstructive sleep apnea (adult) (pediatric): Secondary | ICD-10-CM

## 2016-08-21 DIAGNOSIS — Z9861 Coronary angioplasty status: Secondary | ICD-10-CM

## 2016-08-21 DIAGNOSIS — E785 Hyperlipidemia, unspecified: Secondary | ICD-10-CM | POA: Diagnosis not present

## 2016-08-21 DIAGNOSIS — E119 Type 2 diabetes mellitus without complications: Secondary | ICD-10-CM

## 2016-08-21 DIAGNOSIS — I480 Paroxysmal atrial fibrillation: Secondary | ICD-10-CM

## 2016-08-21 LAB — COMPREHENSIVE METABOLIC PANEL
ALT: 36 U/L (ref 9–46)
AST: 25 U/L (ref 10–35)
Albumin: 4.4 g/dL (ref 3.6–5.1)
Alkaline Phosphatase: 64 U/L (ref 40–115)
BUN: 14 mg/dL (ref 7–25)
CO2: 23 mmol/L (ref 20–31)
Calcium: 9.4 mg/dL (ref 8.6–10.3)
Chloride: 100 mmol/L (ref 98–110)
Creat: 0.89 mg/dL (ref 0.70–1.33)
Glucose, Bld: 127 mg/dL — ABNORMAL HIGH (ref 65–99)
Potassium: 4.1 mmol/L (ref 3.5–5.3)
Sodium: 137 mmol/L (ref 135–146)
Total Bilirubin: 0.6 mg/dL (ref 0.2–1.2)
Total Protein: 7 g/dL (ref 6.1–8.1)

## 2016-08-21 LAB — CBC
HCT: 44.7 % (ref 38.5–50.0)
Hemoglobin: 15.6 g/dL (ref 13.2–17.1)
MCH: 31.1 pg (ref 27.0–33.0)
MCHC: 34.9 g/dL (ref 32.0–36.0)
MCV: 89 fL (ref 80.0–100.0)
MPV: 9.4 fL (ref 7.5–12.5)
Platelets: 238 10*3/uL (ref 140–400)
RBC: 5.02 MIL/uL (ref 4.20–5.80)
RDW: 13.5 % (ref 11.0–15.0)
WBC: 10.1 10*3/uL (ref 3.8–10.8)

## 2016-08-21 MED ORDER — DILTIAZEM HCL ER COATED BEADS 360 MG PO CP24: 360.0000 mg | ORAL_CAPSULE | Freq: Every day | ORAL | 1 refills | Status: DC

## 2016-08-21 MED ORDER — DILTIAZEM HCL ER COATED BEADS 360 MG PO CP24: 360.0000 mg | ORAL_CAPSULE | Freq: Every day | ORAL | 3 refills | Status: DC

## 2016-08-21 MED ORDER — DILTIAZEM HCL ER COATED BEADS 360 MG PO CP24: 240.0000 mg | ORAL_CAPSULE | Freq: Every day | ORAL | 1 refills | Status: DC

## 2016-08-21 NOTE — Progress Notes (Addendum)
Cardiology Office Note   Date:  08/21/2016   ID:  Eric Palmer, DOB 08-Aug-1956, MRN [id number]  PCP:  [name], MD  Cardiologist:  Dr. Tamala Julian     Chief Complaint  Patient presents with  . Tachycardia    PAF      History of Present Illness: Eric Palmer is a 60 y.o. male who presents for SOB, HTN and concern for a fib.   He has a hx of  CAD RCA stent, diabetes mellitus type 2 with poor control, hyperlipidemia, paroxysmal atrial flutter, and essential hypertension.  Echo 2015 with EF 50-55%,  Stress test 09/2013 was normal.    Today he stated he has been having episodes of PAF associated with chest pressure and tightness. No nausea, or SOB.  His BP is also elevated and all of these symptoms are new for him.  On EKG he is in SR but with his symptoms will adjust meds.  Past Medical History:  Diagnosis Date  . Anal fissure   . Chronic bronchitis ([place])    "get it q spring and fall" (10/27/2013)  . Coronary artery disease    a. s/p PCI in 1996. b. LHC 01/2006 showed 50% mid LAD, otherwise widely patent cors with minimal irregularities. c. Nuclear stress test in 04/2009 was reportedly low risk, no ischemia.  . DM type 2, uncontrolled, with neuropathy ([place])   . ED (erectile dysfunction)   . Essential hypertension   . Hx of echocardiogram    Echo (10/2013): Mild LVH, moderate focal basal hypertrophy of the septum, EF 50-55%, no RWMA  . Hyperlipidemia   . Internal hemorrhoids without mention of complication   . Obesity   . Obesity   . OSA on CPAP   . Paresthesia of both legs   . Paroxysmal atrial fibrillation ([name])    a. failed Tikosyn, Multaq. b. s/p ablation of AF/AFL in 12/2013.  [name] Paroxysmal atrial flutter ([place])    a. s/p ablation of AF/AFL in 12/2013.  [name] Smoker    QUIT 10/13/13    Past Surgical History:  Procedure Laterality Date  . ABLATION  01-12-2014   PVI and CTI by Dr [name]  . ANAL FISSURE REPAIR  ~ 2010  . ATRIAL FIBRILLATION ABLATION N/A 01/12/2014   Procedure: ATRIAL FIBRILLATION ABLATION;  Surgeon: Coralyn Mark, MD;  Location: [facility] C[facility];  Service: Cardiovascular;  Laterality: N/A;  . CARDIAC CATHETERIZATION  2007  . CORONARY ANGIOPLASTY WITH STENT PLACEMENT  1996   residual 50% LAD and RCA by cath in 2007  . CORONARY ANGIOPLASTY WITH STENT PLACEMENT  1995  . INGUINAL HERNIA REPAIR Right 1963  . TEE WITHOUT CARDIOVERSION N/A 01/11/2014   Procedure: TRANSESOPHAGEAL ECHOCARDIOGRAM (TEE);  Surgeon: [name], MD;  Location: [facility];  Service: Cardiovascular;  Laterality: N/A;  . TONSILLECTOMY AND ADENOIDECTOMY  1979     Current Outpatient Prescriptions  Medication Sig Dispense Refill  . ADVAIR DISKUS 100-50 MCG/DOSE AEPB INHALE 1 PUFF INTO THE LUNGS TWICE DAILY 60 each 1  . atorvastatin (LIPITOR) 40 MG tablet Take 1 tablet (40 mg total) by mouth daily at 6 PM. 90 tablet 3  . diltiazem (CARDIZEM CD) 360 MG 24 hr capsule Take 1 capsule (360 mg total) by mouth daily. 90 capsule 1  . fish oil-omega-3 fatty acids 1000 MG capsule Take 1 g by mouth 2 (two) times daily.     [name] glucose blood (ONETOUCH VERIO) test strip Check one time daily. DX: 250.00  100 each 3  . metFORMIN (GLUCOPHAGE-XR) 750 MG 24 hr tablet TAKE 1 TABLET BY MOUTH TWICE DAILY 180 tablet 2  . ONETOUCH DELICA LANCETS FINE MISC Check one time daily. DX: 250.00 100 each 3  . pregabalin (LYRICA) 100 MG capsule Take one capsule in the morning, one at lunch, and two at bedtime. 360 capsule 1  . PROAIR HFA 108 (90 Base) MCG/ACT inhaler INHALE 2 PUFFS BY MOUTH EVERY 4 HOURS AS NEEDED FOR WHEEZING OR SHORTNESS OF BREATH 8.5 g 5  . rivaroxaban (XARELTO) 20 MG TABS tablet Take 1 tablet (20 mg total) by mouth daily with supper. 90 tablet 3  . sotalol (BETAPACE) 80 MG tablet Take 1 tablet (80 mg total) by mouth 2 (two) times daily. 180 tablet 3   No current facility-administered medications for this visit.     Allergies:   Cephalosporins; Penicillins; Codeine; Gabapentin; and  Eliquis [apixaban]    Social History:  The patient  reports that he has quit smoking. His smoking use included Cigarettes. He smoked 0.00 packs per day for 35.00 years. He has never used smokeless tobacco. He reports that he drinks alcohol. He reports that he does not use drugs.   Family History:  The patient's family history includes Heart attack in his father; Heart disease in his other; Heart disease (age of onset: 74) in his father; Heart disease (age of onset: 68) in his mother; Hyperlipidemia in his other; Hypertension in his mother.    ROS:  General:no colds or fevers, no weight changes Skin:no rashes or ulcers HEENT:no blurred vision, no congestion CV:see HPI PUL:see HPI GI:no diarrhea constipation or melena, no indigestion GU:no hematuria, no dysuria MS:no joint pain, no claudication Neuro:no syncope, no lightheadedness Endo:+ diabetes followed by PCP, no thyroid disease  Wt Readings from Last 3 Encounters:  08/21/16 263 lb 9.6 oz (119.6 kg)  05/12/16 263 lb (119.3 kg)  03/16/16 261 lb (118.4 kg)     PHYSICAL EXAM: VS:  BP 138/86 (BP Location: Left Arm)   Pulse 83   Ht 6\' 2"  (1.88 m)   Wt 263 lb 9.6 oz (119.6 kg)   BMI 33.84 kg/m  , BMI Body mass index is 33.84 kg/m. General:Pleasant affect, NAD Skin:Warm and dry, brisk capillary refill HEENT:normocephalic, sclera clear, mucus membranes moist Neck:supple, no JVD, no bruits  Heart:S1S2 RRR without murmur, gallup, rub or click Lungs:clear without rales, rhonchi, or wheezes [id number], non tender, + BS, do not palpate liver spleen or masses Ext:no lower ext edema, 2+ pedal pulses, 2+ radial pulses Neuro:alert and oriented X 3, MAE, follows commands, + facial symmetry    EKG:  EKG is ordered today. The ekg ordered today demonstrates SR with PAC no changes.  Recent Labs: 11/01/2015: ALT 35; BUN 12; Creatinine, Ser 0.82; Hemoglobin 15.3; Platelets 230.0; Potassium 4.2; Sodium 138; TSH 1.05    Lipid Panel      Component Value Date/Time   CHOL 115 11/01/2015 0757   TRIG 279.0 (H) 11/01/2015 0757   HDL 34.50 (L) 11/01/2015 0757   CHOLHDL 3 11/01/2015 0757   VLDL 55.8 (H) 11/01/2015 0757   LDLCALC 46 01/27/2013 0837   LDLDIRECT 50.0 11/01/2015 0757       Other studies Reviewed: Additional studies/ records that were reviewed today include:  Echo 2015: Study Conclusions  - Left ventricle: Systolic function was normal. The estimated ejection fraction was in the range of 50% to 55%. - Aortic valve: No evidence of vegetation. - Mitral valve: No evidence  of vegetation. - Left atrium: No evidence of thrombus in the atrial cavity or appendage. - Atrial septum: No defect or patent foramen ovale was identified. - Pulmonic valve: No evidence of vegetation.  ASSESSMENT AND PLAN: 1.  Atrial fibrillation, unspecified Frequent recurrences of tachycardia and palpitations that we suspect are related to atrial fibrillation-  Discussed with DOD Dr. Curt Bears and will increase dilt to 360 mg   2. Chest pain associated with PAF- will check lexiscan myoview to eval for ischemia  3. Essential hypertension Elevated freq- with increase of dilt this should improve.    4. Atherosclerosis of native coronary artery of native heart with angina pectoris possibly due to PAF check nuc  5. Dyslipidemia Lipid panel is relatively well controlled with an LDL not calculated due to elevated triglyceride. Total cholesterol 1:15. HDL 35.  6. Obstructive sleep apnea Wears C Pap  7. Type 2 diabetes mellitus without complication, unspecified long term insulin use status ([place]) This is his big problem and is poorly controlled.  He will follow up as planned with Dr. Tamala Julian  Current medicines are reviewed with the patient today.  The patient Has no concerns regarding medicines.  The following changes have been made:  See above Labs/ tests ordered today include:see above  Disposition:   FU:  see  above  Signed, Cecilie Kicks, NP  08/21/2016 2:55 PM    [address], [place], [name], [place] Phone: [phone]; Fax: [phone]

## 2016-08-21 NOTE — Telephone Encounter (Signed)
   Pt called to report that [facility]) says it did not receive rx for Dilt CD 360 mg that was sent earlier.  I reviewed clinic note and Rx trail from earlier and it does appear to have been sent appropriately.  At his request [facility] [address] [facility] and he is concerned that the [facility] location will close before filling his medication.  Caller was grateful for the call back.  Murray Hodgkins, NP 08/21/2016, 7:07 PM

## 2016-08-21 NOTE — Patient Instructions (Addendum)
Medication Instructions:  Your physician has recommended you make the following change in your medication:  1.  INCREASE the Dilitazem to 360 mg taking 1 tablet daily  Labwork: TODAY:  CMET, CBC, & TSH  Testing/Procedures: Your physician has requested that you have a [place] 09/14/16.  For further information please visit [email or web address]. Please follow instruction sheet, as given.   Follow-Up: Your physician recommends that you schedule a follow-up appointment in: [place]  Any Other Special Instructions Will Be Listed Below (If Applicable).  Pharmacologic Stress Electrocardiogram A pharmacologic stress electrocardiogram is a heart (cardiac) test that uses nuclear imaging to evaluate the blood supply to your heart. This test may also be called a pharmacologic stress electrocardiography. Pharmacologic means that a medicine is used to increase your heart rate and blood pressure.  This stress test is done to find areas of poor blood flow to the heart by determining the extent of coronary artery disease (CAD). Some people exercise on a treadmill, which naturally increases the blood flow to the heart. For those people unable to exercise on a treadmill, a medicine is used. This medicine stimulates your heart and will cause your heart to beat harder and more quickly, as if you were exercising.  Pharmacologic stress tests can help determine:  The adequacy of blood flow to your heart during increased levels of activity in order to clear you for discharge home.  The extent of coronary artery blockage caused by CAD.  Your prognosis if you have suffered a heart attack.  The effectiveness of cardiac procedures done, such as an angioplasty, which can increase the circulation in your coronary arteries.  Causes of chest pain or pressure. LET [facility] CARE PROVIDER KNOW ABOUT:  Any allergies you have.  All medicines you are taking, including vitamins,  herbs, eye drops, creams, and over-the-counter medicines.  Previous problems you or members of your family have had with the use of anesthetics.  Any blood disorders you have.  Previous surgeries you have had.  Medical conditions you have.  Possibility of pregnancy, if this applies.  If you are currently breastfeeding. RISKS AND COMPLICATIONS Generally, this is a safe procedure. However, as with any procedure, complications can occur. Possible complications include:  You develop pain or pressure in the following areas:  Chest.  Jaw or neck.  Between your shoulder blades.  Radiating down your left arm.  Headache.  Dizziness or light-headedness.  Shortness of breath.  Increased or irregular heartbeat.  Low blood pressure.  Nausea or vomiting.  Flushing.  Redness going up the arm and slight pain during injection of medicine.  Heart attack (rare). BEFORE THE PROCEDURE   Avoid all forms of caffeine for 24 hours before your test or as directed by your health care provider. This includes coffee, tea (even decaffeinated tea), caffeinated sodas, chocolate, cocoa, and certain pain medicines.  Follow your health care provider's instructions regarding eating and drinking before the test.  Take your medicines as directed at regular times with water unless instructed otherwise. Exceptions may include:  If you have diabetes, ask how you are to take your insulin or pills. It is common to adjust insulin dosing the morning of the test.  If you are taking beta-blocker medicines, it is important to talk to your health care provider about these medicines well before the date of your test. Taking beta-blocker medicines may interfere with the test. In some cases, these medicines need to be changed or stopped 24 hours  or more before the test.  If you wear a nitroglycerin patch, it may need to be removed prior to the test. Ask your health care provider if the patch should be removed  before the test.  If you use an inhaler for any breathing condition, bring it with you to the test.  If you are an outpatient, bring a snack so you can eat right after the stress phase of the test.  Do not smoke for 4 hours prior to the test or as directed by your health care provider.  Do not apply lotions, powders, creams, or oils on your chest prior to the test.  Wear comfortable shoes and clothing. Let your health care provider know if you were unable to complete or follow the preparations for your test. PROCEDURE   Multiple patches (electrodes) will be put on your chest. If needed, small areas of your chest may be shaved to get better contact with the electrodes. Once the electrodes are attached to your body, multiple wires will be attached to the electrodes, and your heart rate will be monitored.  An IV access will be started. A nuclear trace (isotope) is given. The isotope may be given intravenously, or it may be swallowed. Nuclear refers to several types of radioactive isotopes, and the nuclear isotope lights up the arteries so that the nuclear images are clear. The isotope is absorbed by your body. This results in low radiation exposure.  A resting nuclear image is taken to show how your heart functions at rest.  A medicine is given through the IV access.  A second scan is done about 1 hour after the medicine injection and determines how your heart functions under stress.  During this stress phase, you will be connected to an electrocardiogram machine. Your blood pressure and oxygen levels will be monitored. AFTER THE PROCEDURE   Your heart rate and blood pressure will be monitored after the test.  You may return to your normal schedule, including diet,activities, and medicines, unless your health care provider tells you otherwise. This information is not intended to replace advice given to you by your health care provider. Make sure you discuss any questions you have with  your health care provider. Document Released: 12/27/2008 Document Revised: 08/15/2013 Document Reviewed: 04/17/2013 Elsevier Interactive Patient Education  2017 [employer].    If you need a refill on your cardiac medications before your next appointment, please call your pharmacy.

## 2016-08-22 LAB — TSH: TSH: 1.86 mIU/L (ref 0.40–4.50)

## 2016-08-25 ENCOUNTER — Telehealth: Payer: Self-pay | Admitting: Cardiology

## 2016-08-25 ENCOUNTER — Telehealth (HOSPITAL_COMMUNITY): Payer: Self-pay | Admitting: *Deleted

## 2016-08-25 NOTE — Telephone Encounter (Signed)
Pt aware of his lab results and he verbalized understanding.

## 2016-08-25 NOTE — Telephone Encounter (Signed)
Patient given detailed instructions per Myocardial Perfusion Study Information Sheet for the test on 08/28/16 at 0815. Patient notified to arrive 15 minutes early and that it is imperative to arrive on time for appointment to keep from having the test rescheduled.  If you need to cancel or reschedule your appointment, please call the office within 24 hours of your appointment. Failure to do so may result in a cancellation of your appointment, and a $50 no show fee. Patient verbalized understanding.Tiera Mensinger, [name]

## 2016-08-25 NOTE — Telephone Encounter (Signed)
New message   Returning call for test results

## 2016-08-28 ENCOUNTER — Ambulatory Visit (HOSPITAL_COMMUNITY): Payer: 59 | Attending: Cardiovascular Disease

## 2016-08-28 DIAGNOSIS — R0789 Other chest pain: Secondary | ICD-10-CM | POA: Diagnosis not present

## 2016-08-28 DIAGNOSIS — I480 Paroxysmal atrial fibrillation: Secondary | ICD-10-CM | POA: Diagnosis not present

## 2016-08-28 LAB — MYOCARDIAL PERFUSION IMAGING
LV dias vol: 129 mL (ref 62–150)
LV sys vol: 58 mL
Peak HR: 82 {beats}/min
RATE: 0.31
Rest HR: 74 {beats}/min
SDS: 2
SRS: 3
SSS: 5
TID: 0.96

## 2016-08-28 MED ORDER — TECHNETIUM TC 99M TETROFOSMIN IV KIT
32.7000 | PACK | Freq: Once | INTRAVENOUS | Status: AC | PRN
  Administered 2016-08-28: 32.7 via INTRAVENOUS
  Filled 2016-08-28: qty 33

## 2016-08-28 MED ORDER — TECHNETIUM TC 99M TETROFOSMIN IV KIT
10.3000 | PACK | Freq: Once | INTRAVENOUS | Status: AC | PRN
  Administered 2016-08-28: 10.3 via INTRAVENOUS
  Filled 2016-08-28: qty 11

## 2016-08-28 MED ORDER — REGADENOSON 0.4 MG/5ML IV SOLN
0.4000 mg | Freq: Once | INTRAVENOUS | Status: AC
  Administered 2016-08-28: 0.4 mg via INTRAVENOUS

## 2016-09-14 ENCOUNTER — Ambulatory Visit (INDEPENDENT_AMBULATORY_CARE_PROVIDER_SITE_OTHER): Payer: 59 | Admitting: Interventional Cardiology

## 2016-09-14 ENCOUNTER — Encounter: Payer: Self-pay | Admitting: Interventional Cardiology

## 2016-09-14 VITALS — BP 140/66 | HR 86 | Ht 74.0 in | Wt 261.0 lb

## 2016-09-14 DIAGNOSIS — I10 Essential (primary) hypertension: Secondary | ICD-10-CM | POA: Diagnosis not present

## 2016-09-14 DIAGNOSIS — I480 Paroxysmal atrial fibrillation: Secondary | ICD-10-CM | POA: Diagnosis not present

## 2016-09-14 DIAGNOSIS — Z7901 Long term (current) use of anticoagulants: Secondary | ICD-10-CM | POA: Diagnosis not present

## 2016-09-14 DIAGNOSIS — I2510 Atherosclerotic heart disease of native coronary artery without angina pectoris: Secondary | ICD-10-CM

## 2016-09-14 MED ORDER — NITROGLYCERIN 0.4 MG SL SUBL: 0.4000 mg | SUBLINGUAL_TABLET | SUBLINGUAL | 3 refills | Status: DC | PRN

## 2016-09-14 MED ORDER — VALSARTAN 80 MG PO TABS: 80.0000 mg | ORAL_TABLET | Freq: Every day | ORAL | 3 refills | Status: DC

## 2016-09-14 NOTE — Patient Instructions (Signed)
Medication Instructions:  1) START Diovan 80mg  once daily 2) START Nitroglycerin 0.4mg  as directed.  The proper use and anticipated side effects of nitroglycerine has been carefully explained.  If a single episode of chest pain is not relieved by one tablet, the patient will try another within 5 minutes; and if this doesn't relieve the pain, the patient is instructed to call 911 for transportation to an emergency department.   Labwork: Your physician recommends that you return for lab work in: 2 weeks (BMET)   Testing/Procedures: None  Follow-Up: Your physician wants you to follow-up in: 3 months with Dr. Tamala Julian. You will receive a reminder letter in the mail two months in advance. If you don't receive a letter, please call our office to schedule the follow-up appointment.   Any Other Special Instructions Will Be Listed Below (If Applicable).  Call the office in 2 weeks and let us know how often you are taking the Nitroglycerin.    If you need a refill on your cardiac medications before your next appointment, please call your pharmacy.

## 2016-09-14 NOTE — Progress Notes (Signed)
Cardiology Office Note    Date:  09/14/2016   ID:  Eric Palmer, DOB 01-23-1956, MRN [id number]  PCP:  Eric Post, MD  Cardiologist: Eric Grooms, MD   Chief Complaint  Patient presents with  . Coronary Artery Disease    History of Present Illness:  Eric Palmer is a 61 y.o. male with a history of corneal artery disease, remote right coronary stent in 1995, moderate mid LAD stenosis in 2007 by cath, and recent low risk myocardial perfusion study. Intercurrent diagnoses of diabetes mellitus, known history of hypertension, obesity, paroxysmal atrial fibrillation on sotalol therapy, and poorly controlled hyperlipidemia. Also history of obstructive sleep apnea.  Abdominal seems somewhat depressed. Is concerned that he is on so many medications. She now accepts the diagnosis of diabetes but has not been able to diet or lose weight. His medication regimen is escalating. He is requesting to have some of his medications decreased or discontinued.  He was last seen on 08/21/2016 complaining of dyspnea and "atrial fibrillation". Despite the complaint of atrial fibrillation, it was not documented by EKG on the day that he was seen. A Lexiscan Myoview was performed that did not reveal evidence of significant ischemia. Despite the nuclear stress test, he complains today that he is having intermittent episodes of chest tightness associated with dyspnea and irregular heart rhythm. These seem to be associated. Episodes are lasting less than 5 minutes. He has not had an episode yet today. These episodes can occur at rest and also with activity   Past Medical History:  Diagnosis Date  . Anal fissure   . Chronic bronchitis ([place])    "get it q spring and fall" (10/27/2013)  . Coronary artery disease    a. s/p PCI in 1996. b. LHC 01/2006 showed 50% mid LAD, otherwise widely patent cors with minimal irregularities. c. Nuclear stress test in 04/2009 was reportedly low risk, no ischemia.  . DM  type 2, uncontrolled, with neuropathy ([name])   . ED (erectile dysfunction)   . Essential hypertension   . Hx of echocardiogram    Echo (10/2013): Mild LVH, moderate focal basal hypertrophy of the septum, EF 50-55%, no RWMA  . Hyperlipidemia   . Internal hemorrhoids without mention of complication   . Obesity   . Obesity   . OSA on CPAP   . Paresthesia of both legs   . Paroxysmal atrial fibrillation ([name])    a. failed Tikosyn, Multaq. b. s/p ablation of AF/AFL in 12/2013.  [name] Paroxysmal atrial flutter ([name])    a. s/p ablation of AF/AFL in 12/2013.  [name] Smoker    QUIT 10/13/13    Past Surgical History:  Procedure Laterality Date  . ABLATION  01-12-2014   PVI and CTI by Dr [name]  . ANAL FISSURE REPAIR  ~ 2010  . ATRIAL FIBRILLATION ABLATION N/A 01/12/2014   Procedure: ATRIAL FIBRILLATION ABLATION;  Surgeon: Coralyn Mark, MD;  Location: [facility];  Service: Cardiovascular;  Laterality: N/A;  . CARDIAC CATHETERIZATION  2007  . CORONARY ANGIOPLASTY WITH STENT PLACEMENT  1996   residual 50% LAD and RCA by cath in 2007  . CORONARY ANGIOPLASTY WITH STENT PLACEMENT  1995  . INGUINAL HERNIA REPAIR Right 1963  . TEE WITHOUT CARDIOVERSION N/A 01/11/2014   Procedure: TRANSESOPHAGEAL ECHOCARDIOGRAM (TEE);  Surgeon: [name], MD;  Location: [facility];  Service: Cardiovascular;  Laterality: N/A;  . TONSILLECTOMY AND ADENOIDECTOMY  1979    Current Medications: Outpatient Medications  Prior to Visit  Medication Sig Dispense Refill  . ADVAIR DISKUS 100-50 MCG/DOSE AEPB INHALE 1 PUFF INTO THE LUNGS TWICE DAILY 60 each 1  . atorvastatin (LIPITOR) 40 MG tablet Take 1 tablet (40 mg total) by mouth daily at 6 PM. 90 tablet 3  . diltiazem (CARDIZEM CD) 360 MG 24 hr capsule Take 1 capsule (360 mg total) by mouth daily. 90 capsule 3  . fish oil-omega-3 fatty acids 1000 MG capsule Take 1 g by mouth 2 (two) times daily.     [name] glucose blood (ONETOUCH VERIO) test strip Check one time daily. DX: 250.00  100 each 3  . metFORMIN (GLUCOPHAGE-XR) 750 MG 24 hr tablet TAKE 1 TABLET BY MOUTH TWICE DAILY 180 tablet 2  . ONETOUCH DELICA LANCETS FINE MISC Check one time daily. DX: 250.00 100 each 3  . pregabalin (LYRICA) 100 MG capsule Take one capsule in the morning, one at lunch, and two at bedtime. 360 capsule 1  . PROAIR HFA 108 (90 Base) MCG/ACT inhaler INHALE 2 PUFFS BY MOUTH EVERY 4 HOURS AS NEEDED FOR WHEEZING OR SHORTNESS OF BREATH 8.5 g 5  . rivaroxaban (XARELTO) 20 MG TABS tablet Take 1 tablet (20 mg total) by mouth daily with supper. 90 tablet 3  . sotalol (BETAPACE) 80 MG tablet Take 1 tablet (80 mg total) by mouth 2 (two) times daily. 180 tablet 3   No facility-administered medications prior to visit.      Allergies:   Cephalosporins; Penicillins; Codeine; Gabapentin; and Eliquis [apixaban]   Social History   Social History  . Marital status: Married    Spouse name: N/A  . Number of children: N/A  . Years of education: N/A   Social History Main Topics  . Smoking status: Former Smoker    Packs/day: 0.00    Years: 35.00    Types: Cigarettes  . Smokeless tobacco: Never Used  . Alcohol use 0.0 oz/week     Comment: 10/27/2013 "might have 1 beer/month"  . Drug use: No  . Sexual activity: Yes   Other Topics Concern  . None   Social History Narrative   Works as an Programme researcher, broadcasting/film/video for [employer] History:  The patient's family history includes Heart attack in his father; Heart disease in his other; Heart disease (age of onset: 22) in his father; Heart disease (age of onset: 69) in his mother; Hyperlipidemia in his other; Hypertension in his mother.   ROS:   Please see the history of present illness.    Irregular heartbeat, numbness and pain in both lower extremities, shortness of breath with activity, depression, and anxiety.  All other systems reviewed and are negative.   PHYSICAL EXAM:   VS:  BP 140/66 Comment: has brought at home BP readings.  Pulse 86   Ht 6\' 2"   (1.88 m)   Wt 261 lb (118.4 kg)   SpO2 96%   BMI 33.51 kg/m    GEN: Well nourished, well developed, in no acute distress  HEENT: normal  Neck: no JVD, carotid bruits, or masses Cardiac: RRR; no murmurs, rubs, or gallops,no edema  Respiratory:  clear to auscultation bilaterally, normal work of breathing GI: soft, nontender, nondistended, + BS MS: no deformity or atrophy  Skin: warm and dry, no rash Neuro:  Alert and Oriented x 3, Strength and sensation are intact Psych: euthymic mood, full affect  Wt Readings from Last 3 Encounters:  09/14/16 261 lb (118.4 kg)  08/21/16 263 lb 9.6  oz (119.6 kg)  05/12/16 263 lb (119.3 kg)      Studies/Labs Reviewed:   EKG:  ECG is not repeated. I did review the tracing from 08/21/16, which did not reveal any evidence of ischemia or a Q wave infarction. Nonspecific ST flattening was noted.  Recent Labs: 08/21/2016: ALT 36; BUN 14; Creat 0.89; Hemoglobin 15.6; Platelets 238; Potassium 4.1; Sodium 137; TSH 1.86   Lipid Panel    Component Value Date/Time   CHOL 115 11/01/2015 0757   TRIG 279.0 (H) 11/01/2015 0757   HDL 34.50 (L) 11/01/2015 0757   CHOLHDL 3 11/01/2015 0757   VLDL 55.8 (H) 11/01/2015 0757   LDLCALC 46 01/27/2013 0837   LDLDIRECT 50.0 11/01/2015 0757    Additional studies/ records that were reviewed today include:  Nuclear stress test 08/28/16:  Study Highlights     Nuclear stress EF: 55%.  There was no ST segment deviation noted during stress.   Significant attenuation of inferior wall especially at mid and basal level Cannot r/o small inferior infarct vs artifact No ischemia Normal ECG and normal wall motion with EF 55% suggest artifact    This study was not different than the one performed on 10/18/13.  Coronary angiography most recently performed in 2007:  50% mid LAD  Widely patent circumflex and RCA. Remote history of RCA angioplasty ASSESSMENT:    1. CAD in native artery   2. Paroxysmal atrial  fibrillation ([name])   3. Essential hypertension   4. Chronic anticoagulation      PLAN:  In order of problems listed above:  1. Despite the appearance of the myocardial perfusion study, I am concerned that he has progression of underlying coronary disease. Perhaps he has matched three-vessel ischemia. It is possible that he is having paroxysmal atrial fibrillation that is driving his complaint. I decided to give him a prescription for nitroglycerin to use if he has recurrent episodes of discomfort. The last episode of discomfort lasted long enough that he used his inhaler to relieve the associated shortness of breath. I encouraged him not to use the inhaler in these circumstances. I will speak with him in 7-10 days to see if he has had recurrent episodes for which nitroglycerin has been needed. I also cautioned him that if he has discomfort that last longer than 15-20 minutes despite a sublingual nitroglycerin, he should report to the emergency room. 2. Continue the current dose of sotalol. 3. Blood pressure is poorly controlled for a diabetic and will add Diovan 80 mg per day to give renal protection and better blood pressure control. The patient brought in blood pressure recordings that were started on December 30 and they range between 127/90 and 164/104 mmHg with a great preponderance of recordings greater than 140/90 mmHg. 4. Continue Xarelto for stroke prophylaxis.    Medication Adjustments/Labs and Tests Ordered: Current medicines are reviewed at length with the patient today.  Concerns regarding medicines are outlined above.  Medication changes, Labs and Tests ordered today are listed in the Patient Instructions below. Patient Instructions  Medication Instructions:  1) START Diovan 80mg  once daily 2) START Nitroglycerin 0.4mg  as directed.  The proper use and anticipated side effects of nitroglycerine has been carefully explained.  If a single episode of chest pain is not relieved by one  tablet, the patient will try another within 5 minutes; and if this doesn't relieve the pain, the patient is instructed to call 911 for transportation to an emergency department.   Labwork: Your physician  recommends that you return for lab work in: 2 weeks (BMET)   Testing/Procedures: None  Follow-Up: Your physician wants you to follow-up in: 3 months with Dr. Tamala Julian. You will receive a reminder letter in the mail two months in advance. If you don't receive a letter, please call our office to schedule the follow-up appointment.   Any Other Special Instructions Will Be Listed Below (If Applicable).  Call the office in 2 weeks and let us know how often you are taking the Nitroglycerin.    If you need a refill on your cardiac medications before your next appointment, please call your pharmacy.      Signed, Eric Grooms, MD  09/14/2016 5:26 PM    [address], [place], [place]  [address] Phone: [phone]; Fax: [phone]

## 2016-09-28 ENCOUNTER — Other Ambulatory Visit: Payer: 59 | Admitting: *Deleted

## 2016-09-28 ENCOUNTER — Telehealth: Payer: Self-pay | Admitting: Family Medicine

## 2016-09-28 DIAGNOSIS — I480 Paroxysmal atrial fibrillation: Secondary | ICD-10-CM | POA: Diagnosis not present

## 2016-09-28 DIAGNOSIS — I10 Essential (primary) hypertension: Secondary | ICD-10-CM

## 2016-09-28 MED ORDER — ONETOUCH DELICA LANCETS FINE MISC: 3 refills | Status: DC

## 2016-09-28 MED ORDER — GLUCOSE BLOOD VI STRP: ORAL_STRIP | 3 refills | Status: DC

## 2016-09-28 NOTE — Telephone Encounter (Signed)
Medication sent in for patient. 

## 2016-09-28 NOTE — Telephone Encounter (Signed)
Pt need new Rx for One Touch Delica strips and lancets  Pharm:  [facility] on [address]

## 2016-09-29 LAB — BASIC METABOLIC PANEL
BUN/Creatinine Ratio: 14 (ref 9–20)
BUN: 13 mg/dL (ref 6–24)
CO2: 24 mmol/L (ref 18–29)
Calcium: 9.2 mg/dL (ref 8.7–10.2)
Chloride: 96 mmol/L (ref 96–106)
Creatinine, Ser: 0.95 mg/dL (ref 0.76–1.27)
GFR calc Af Amer: 101 mL/min/{1.73_m2} (ref >59)
GFR calc non Af Amer: 87 mL/min/{1.73_m2} (ref >59)
Glucose: 195 mg/dL — ABNORMAL HIGH (ref 65–99)
Potassium: 4.1 mmol/L (ref 3.5–5.2)
Sodium: 141 mmol/L (ref 134–144)

## 2016-10-13 ENCOUNTER — Other Ambulatory Visit: Payer: Self-pay | Admitting: Family Medicine

## 2016-10-23 ENCOUNTER — Ambulatory Visit (INDEPENDENT_AMBULATORY_CARE_PROVIDER_SITE_OTHER): Payer: 59 | Admitting: Family Medicine

## 2016-10-23 ENCOUNTER — Encounter: Payer: Self-pay | Admitting: Family Medicine

## 2016-10-23 VITALS — BP 102/78 | HR 90 | Temp 98.5°F | Ht 74.0 in | Wt 267.4 lb

## 2016-10-23 DIAGNOSIS — J111 Influenza due to unidentified influenza virus with other respiratory manifestations: Secondary | ICD-10-CM

## 2016-10-23 DIAGNOSIS — J4540 Moderate persistent asthma, uncomplicated: Secondary | ICD-10-CM

## 2016-10-23 MED ORDER — BENZONATATE 100 MG PO CAPS: 100.0000 mg | ORAL_CAPSULE | Freq: Two times a day (BID) | ORAL | 0 refills | Status: DC | PRN

## 2016-10-23 MED ORDER — OSELTAMIVIR PHOSPHATE 75 MG PO CAPS: 75.0000 mg | ORAL_CAPSULE | Freq: Two times a day (BID) | ORAL | 0 refills | Status: DC

## 2016-10-23 NOTE — Progress Notes (Signed)
Pre visit review using our clinic review tool, if applicable. No additional management support is needed unless otherwise documented below in the visit note. 

## 2016-10-23 NOTE — Patient Instructions (Signed)
Start the Tamiflu right away.  Use you asthma medications as prescribed.  Tessalon for cough as needed.  I hope you are feeling better soon! Seek care immediately if worsening, new concerns or you are not improving with treatment.

## 2016-10-23 NOTE — Progress Notes (Signed)
HPI:  Flu like symptoms -started: 2 days ago -symptoms:nasal congestion, sore throat, cough, body aches, low grade temps to 100-100.3 -denies SOB, wheezing or thick mucus production with cough, NVD, tooth pain,  -he has a hx of asthma per his report uses advair once daily instead of twice daily and uses albuterol as needed - has not needed his albuterol -hx of smoking, reports quit last year -has tried: nothing -sick contacts/travel/risks: he has been around a friend with the flu  ROS: See pertinent positives and negatives per HPI.  Past Medical History:  Diagnosis Date  . Anal fissure   . Chronic bronchitis ([place])    "get it q spring and fall" (10/27/2013)  . Coronary artery disease    a. s/p PCI in 1996. b. LHC 01/2006 showed 50% mid LAD, otherwise widely patent cors with minimal irregularities. c. Nuclear stress test in 04/2009 was reportedly low risk, no ischemia.  . DM type 2, uncontrolled, with neuropathy ([place])   . ED (erectile dysfunction)   . Essential hypertension   . Hx of echocardiogram    Echo (10/2013): Mild LVH, moderate focal basal hypertrophy of the septum, EF 50-55%, no RWMA  . Hyperlipidemia   . Internal hemorrhoids without mention of complication   . Obesity   . Obesity   . OSA on CPAP   . Paresthesia of both legs   . Paroxysmal atrial fibrillation ([name])    a. failed Tikosyn, Multaq. b. s/p ablation of AF/AFL in 12/2013.  [name] Paroxysmal atrial flutter ([place])    a. s/p ablation of AF/AFL in 12/2013.  [name] Smoker    QUIT 10/13/13    Past Surgical History:  Procedure Laterality Date  . ABLATION  01-12-2014   PVI and CTI by Dr [name]  . ANAL FISSURE REPAIR  ~ 2010  . ATRIAL FIBRILLATION ABLATION N/A 01/12/2014   Procedure: ATRIAL FIBRILLATION ABLATION;  Surgeon: Coralyn Mark, MD;  Location: [facility];  Service: Cardiovascular;  Laterality: N/A;  . CARDIAC CATHETERIZATION  2007  . CORONARY ANGIOPLASTY WITH STENT PLACEMENT  1996   residual 50% LAD and RCA by cath  in 2007  . CORONARY ANGIOPLASTY WITH STENT PLACEMENT  1995  . INGUINAL HERNIA REPAIR Right 1963  . TEE WITHOUT CARDIOVERSION N/A 01/11/2014   Procedure: TRANSESOPHAGEAL ECHOCARDIOGRAM (TEE);  Surgeon: [name], MD;  Location: [facility];  Service: Cardiovascular;  Laterality: N/A;  . TONSILLECTOMY AND ADENOIDECTOMY  1979    Family History  Problem Relation Age of Onset  . Heart disease Mother 39    Arrhythmia  . Hypertension Mother   . Heart disease Father 37    died 62 CHF  . Heart attack Father   . Hyperlipidemia Other   . Heart disease Other   . Stroke Neg Hx     Social History   Social History  . Marital status: Married    Spouse name: N/A  . Number of children: N/A  . Years of education: N/A   Social History Main Topics  . Smoking status: Former Smoker    Packs/day: 0.00    Years: 35.00    Types: Cigarettes  . Smokeless tobacco: Never Used  . Alcohol use 0.0 oz/week     Comment: 10/27/2013 "might have 1 beer/month"  . Drug use: No  . Sexual activity: Yes   Other Topics Concern  . None   Social History Narrative   Works as an Programme researcher, broadcasting/film/video for [employer]  Prescriptions:  .  ADVAIR DISKUS 100-50 MCG/DOSE AEPB, INHALE 1 PUFF INTO THE LUNGS TWICE DAILY, Disp: 60 each, Rfl: 1 .  atorvastatin (LIPITOR) 40 MG tablet, Take 1 tablet (40 mg total) by mouth daily at 6 PM., Disp: 90 tablet, Rfl: 3 .  diltiazem (CARDIZEM CD) 360 MG 24 hr capsule, Take 1 capsule (360 mg total) by mouth daily., Disp: 90 capsule, Rfl: 3 .  fish oil-omega-3 fatty acids 1000 MG capsule, Take 1 g by mouth 2 (two) times daily. , Disp: , Rfl:  .  glucose blood (ONETOUCH VERIO) test strip, Check one time daily. DX: 250.00, Disp: 100 each, Rfl: 3 .  metFORMIN (GLUCOPHAGE-XR) 750 MG 24 hr tablet, TAKE 1 TABLET BY MOUTH TWICE DAILY, Disp: 180 tablet, Rfl: 2 .  nitroGLYCERIN (NITROSTAT) 0.4 MG SL tablet, Place 1 tablet (0.4 mg total) under the tongue every 5 (five) minutes as  needed for chest pain., Disp: 25 tablet, Rfl: 3 .  ONETOUCH DELICA LANCETS FINE MISC, Check one time daily. DX: 250.00, Disp: 100 each, Rfl: 3 .  pregabalin (LYRICA) 100 MG capsule, Take one capsule in the morning, one at lunch, and two at bedtime., Disp: 360 capsule, Rfl: 1 .  PROAIR HFA 108 (90 Base) MCG/ACT inhaler, INHALE 2 PUFFS BY MOUTH EVERY 4 HOURS AS NEEDED FOR WHEEZING OR SHORTNESS OF BREATH, Disp: 8.5 g, Rfl: 5 .  rivaroxaban (XARELTO) 20 MG TABS tablet, Take 1 tablet (20 mg total) by mouth daily with supper., Disp: 90 tablet, Rfl: 3 .  sotalol (BETAPACE) 80 MG tablet, Take 1 tablet (80 mg total) by mouth 2 (two) times daily., Disp: 180 tablet, Rfl: 3 .  valsartan (DIOVAN) 80 MG tablet, Take 1 tablet (80 mg total) by mouth daily., Disp: 90 tablet, Rfl: 3 .  benzonatate (TESSALON) 100 MG capsule, Take 1 capsule (100 mg total) by mouth 2 (two) times daily as needed for cough., Disp: 20 capsule, Rfl: 0 .  oseltamivir (TAMIFLU) 75 MG capsule, Take 1 capsule (75 mg total) by mouth 2 (two) times daily., Disp: 10 capsule, Rfl: 0  EXAM:  Vitals:   10/23/16 1454  BP: 102/78  Pulse: 90  Temp: 98.5 F (36.9 C)    Body mass index is 34.33 kg/m.  GENERAL: vitals reviewed and listed above, alert, oriented, appears well hydrated and in no acute distress  HEENT: atraumatic, conjunttiva clear, no obvious abnormalities on inspection of external nose and ears, normal appearance of ear canals and TMs, clear nasal congestion, mild post oropharyngeal erythema with PND, no tonsillar edema or exudate, no sinus TTP  NECK: no obvious masses on inspection  LUNGS: clear to auscultation bilaterally, no wheezes, rales or rhonchi, good air movement  CV: HRRR, no peripheral edema  MS: moves all extremities without noticeable abnormality  PSYCH: pleasant and cooperative, no obvious depression or anxiety  ASSESSMENT AND PLAN:  Discussed the following assessment and plan:  Influenza  Moderate  persistent asthma, unspecified whether complicated  We discussed potential/likely etiologies, with mild influenza being likely. We discussed risks/benefits/indications/best timing for tamiflu, likely course, transmission, potential complications, signs of developing a serious illness and return and emergency precuations. Given his co morbidities opted to treat with tamiflu. Tessalon for cough. No breathing issues or sig asthma symptoms currently. Strict return/ER/UCC precautions advised if worsening over the weekend or next week instead of improving.    Patient Instructions  Start the Tamiflu right away.  Use you asthma medications as prescribed.  Tessalon for cough as  needed.  I hope you are feeling better soon! Seek care immediately if worsening, new concerns or you are not improving with treatment.     Colin Benton R., DO

## 2016-11-02 ENCOUNTER — Other Ambulatory Visit: Payer: Self-pay | Admitting: Family Medicine

## 2016-11-17 DIAGNOSIS — G4733 Obstructive sleep apnea (adult) (pediatric): Secondary | ICD-10-CM | POA: Diagnosis not present

## 2016-11-29 ENCOUNTER — Other Ambulatory Visit: Payer: Self-pay | Admitting: Interventional Cardiology

## 2016-12-10 ENCOUNTER — Other Ambulatory Visit: Payer: Self-pay | Admitting: Interventional Cardiology

## 2016-12-10 ENCOUNTER — Other Ambulatory Visit: Payer: Self-pay | Admitting: Family Medicine

## 2016-12-12 ENCOUNTER — Other Ambulatory Visit: Payer: Self-pay | Admitting: Interventional Cardiology

## 2016-12-12 DIAGNOSIS — I480 Paroxysmal atrial fibrillation: Secondary | ICD-10-CM

## 2016-12-13 NOTE — Progress Notes (Signed)
Cardiology Office Note    Date:  12/14/2016   ID:  Eric Palmer, DOB 1956-04-04, MRN [id number]  PCP:  [name], MD  Cardiologist: [name], MD   Chief Complaint  Patient presents with  . Coronary Artery Disease  . Atrial Fibrillation    History of Present Illness:  Eric Palmer is a 61 y.o. male with a history of coronary artery disease, remote right coronary stent in 1995, moderate mid LAD stenosis in 2007 by cath, and recent low risk myocardial perfusion study. Intercurrent diagnoses of diabetes mellitus, known history of hypertension, obesity, paroxysmal atrial fibrillation on sotalol therapy, and poorly controlled hyperlipidemia. Also history of obstructive sleep apnea.  Perhaps twice per week and may be brief episodes of atrial fibrillation. This causes the chest to tighten up. Episodes last less than 30 minutes. Usually resolves with decrease in physical activity and resting. He doesn't use nitroglycerin. Chest discomfort is always in the setting of irregular heart beating as occurs with atrial fibrillation.  No exertion-related chest discomfort when heart is in rhythm. No bleeding complications on Xarelto.  Past Medical History:  Diagnosis Date  . Anal fissure   . Chronic bronchitis ([place])    "get it q spring and fall" (10/27/2013)  . Coronary artery disease    a. s/p PCI in 1996. b. LHC 01/2006 showed 50% mid LAD, otherwise widely patent cors with minimal irregularities. c. Nuclear stress test in 04/2009 was reportedly low risk, no ischemia.  . DM type 2, uncontrolled, with neuropathy ([place])   . ED (erectile dysfunction)   . Essential hypertension   . Hx of echocardiogram    Echo (10/2013): Mild LVH, moderate focal basal hypertrophy of the septum, EF 50-55%, no RWMA  . Hyperlipidemia   . Internal hemorrhoids without mention of complication   . Obesity   . Obesity   . OSA on CPAP   . Paresthesia of both legs   . Paroxysmal atrial fibrillation ([name])     a. failed Tikosyn, Multaq. b. s/p ablation of AF/AFL in 12/2013.  [name] Paroxysmal atrial flutter ([place])    a. s/p ablation of AF/AFL in 12/2013.  [name] Smoker    QUIT 10/13/13    Past Surgical History:  Procedure Laterality Date  . ABLATION  01-12-2014   PVI and CTI by Dr [name]  . ANAL FISSURE REPAIR  ~ 2010  . ATRIAL FIBRILLATION ABLATION N/A 01/12/2014   Procedure: ATRIAL FIBRILLATION ABLATION;  Surgeon: Coralyn Mark, MD;  Location: [facility];  Service: Cardiovascular;  Laterality: N/A;  . CARDIAC CATHETERIZATION  2007  . CORONARY ANGIOPLASTY WITH STENT PLACEMENT  1996   residual 50% LAD and RCA by cath in 2007  . CORONARY ANGIOPLASTY WITH STENT PLACEMENT  1995  . INGUINAL HERNIA REPAIR Right 1963  . TEE WITHOUT CARDIOVERSION N/A 01/11/2014   Procedure: TRANSESOPHAGEAL ECHOCARDIOGRAM (TEE);  Surgeon: [name], MD;  Location: [facility];  Service: Cardiovascular;  Laterality: N/A;  . TONSILLECTOMY AND ADENOIDECTOMY  1979    Current Medications: Outpatient Medications Prior to Visit  Medication Sig Dispense Refill  . ADVAIR DISKUS 100-50 MCG/DOSE AEPB INHALE 1 PUFF INTO THE LUNGS TWICE DAILY 60 each 1  . atorvastatin (LIPITOR) 40 MG tablet TAKE 1 TABLET(40 MG) BY MOUTH DAILY AT 6 PM 90 tablet 0  . diltiazem (CARDIZEM CD) 360 MG 24 hr capsule Take 1 capsule (360 mg total) by mouth daily. 90 capsule 3  . fish oil-omega-3 fatty acids  1000 MG capsule Take 1 g by mouth 2 (two) times daily.     [name] glucose blood (ONETOUCH VERIO) test strip Check one time daily. DX: 250.00 100 each 3  . metFORMIN (GLUCOPHAGE-XR) 750 MG 24 hr tablet TAKE 1 TABLET BY MOUTH TWICE DAILY 180 tablet 2  . ONETOUCH DELICA LANCETS FINE MISC Check one time daily. DX: 250.00 100 each 3  . pregabalin (LYRICA) 100 MG capsule Take one capsule in the morning, one at lunch, and two at bedtime. 360 capsule 1  . PROAIR HFA 108 (90 Base) MCG/ACT inhaler INHALE 2 PUFFS BY MOUTH EVERY 4 HOURS AS NEEDED FOR WHEEZING OR  SHORTNESS OF BREATH 8.5 g 5  . XARELTO 20 MG TABS tablet TAKE 1 TABLET(20 MG) BY MOUTH DAILY WITH SUPPER 90 tablet 1  . sotalol (BETAPACE) 80 MG tablet TAKE 1 TABLET(80 MG) BY MOUTH TWICE DAILY 180 tablet 2  . valsartan (DIOVAN) 80 MG tablet Take 1 tablet (80 mg total) by mouth daily. 90 tablet 3  . benzonatate (TESSALON) 100 MG capsule Take 1 capsule (100 mg total) by mouth 2 (two) times daily as needed for cough. (Patient not taking: Reported on 12/14/2016) 20 capsule 0  . metFORMIN (GLUCOPHAGE-XR) 750 MG 24 hr tablet TAKE 1 TABLET BY MOUTH TWICE DAILY (Patient not taking: Reported on 12/14/2016) 180 tablet 0  . nitroGLYCERIN (NITROSTAT) 0.4 MG SL tablet Place 1 tablet (0.4 mg total) under the tongue every 5 (five) minutes as needed for chest pain. 25 tablet 3  . oseltamivir (TAMIFLU) 75 MG capsule Take 1 capsule (75 mg total) by mouth 2 (two) times daily. (Patient not taking: Reported on 12/14/2016) 10 capsule 0   No facility-administered medications prior to visit.      Allergies:   Cephalosporins; Penicillins; Codeine; Gabapentin; and Eliquis [apixaban]   Social History   Social History  . Marital status: Married    Spouse name: N/A  . Number of children: N/A  . Years of education: N/A   Social History Main Topics  . Smoking status: Former Smoker    Packs/day: 0.00    Years: 35.00    Types: Cigarettes  . Smokeless tobacco: Never Used  . Alcohol use 0.0 oz/week     Comment: 10/27/2013 "might have 1 beer/month"  . Drug use: No  . Sexual activity: Yes   Other Topics Concern  . None   Social History Narrative   Works as an Programme researcher, broadcasting/film/video for [employer] History:  The patient's family history includes Heart attack in his father; Heart disease in his other; Heart disease (age of onset: 38) in his father; Heart disease (age of onset: 23) in his mother; Hyperlipidemia in his other; Hypertension in his mother.   ROS:   Please see the history of present illness.    Sleep apnea  therapy being managed by Dr. Radford Pax. Some insomnia with early morning awakening and inability to fall back off to sleep.  All other systems reviewed and are negative.   PHYSICAL EXAM:   VS:  BP 116/70 (BP Location: Left Arm)   Pulse 74   Ht 6\' 2"  (1.88 m)   Wt 265 lb (120.2 kg)   BMI 34.02 kg/m    GEN: Well nourished, well developed, in no acute distress  HEENT: normal  Neck: no JVD, carotid bruits, or masses Cardiac: RRR; no murmurs, rubs, or gallops,no edema  Respiratory:  clear to auscultation bilaterally, normal work of breathing GI: soft, nontender, nondistended, +  BS MS: no deformity or atrophy  Skin: warm and dry, no rash Neuro:  Alert and Oriented x 3, Strength and sensation are intact Psych: euthymic mood, full affect  Wt Readings from Last 3 Encounters:  12/14/16 265 lb (120.2 kg)  10/23/16 267 lb 6.4 oz (121.3 kg)  09/14/16 261 lb (118.4 kg)      Studies/Labs Reviewed:   EKG:  EKG  none  Recent Labs: 08/21/2016: ALT 36; Hemoglobin 15.6; Platelets 238; TSH 1.86 09/28/2016: BUN 13; Creatinine, Ser 0.95; Potassium 4.1; Sodium 141   Lipid Panel    Component Value Date/Time   CHOL 115 11/01/2015 0757   TRIG 279.0 (H) 11/01/2015 0757   HDL 34.50 (L) 11/01/2015 0757   CHOLHDL 3 11/01/2015 0757   VLDL 55.8 (H) 11/01/2015 0757   LDLCALC 46 01/27/2013 0837   LDLDIRECT 50.0 11/01/2015 0757    Additional studies/ records that were reviewed today include:  No new data.    ASSESSMENT:    1. CAD in native artery   2. Essential hypertension   3. Paroxysmal atrial fibrillation ([name])   4. Simple chronic bronchitis ([place])   5. Type 2 diabetes mellitus with diabetic polyneuropathy, without long-term current use of insulin ([place])   6. Tobacco use disorder   7. Right carotid bruit   8. Other hyperlipidemia      PLAN:  In order of problems listed above:  1. Does have angina when in atrial fibrillation. Otherwise asymptomatic. Continue to monitor for  progression. 2. Excellent control and he knows separate pressures are in a similar range as today's office visit when he checks it at home. 2 g sodium diet. 3. Continue both anticoagulation and antiarrhythmic therapy (Betapace) 4 management. 4. Not addressed. 5. Managed by primary care. Has significantly decreased carbohydrate intake. No change in weight. Struggling with diet control. 6. Discussed smoking cessation. 7. No obvious bruit on exam today. 8. LDL target less than 70.  9 and 12 month follow-up. Aerobic activity is encouraged. Smoking cessation is encouraged. Carbohydrate modified 2 g sodium diet discussed. If increasing episodes of atrial fibrillation, we need to up titrate Betapace dose perhaps to 120 mg or 160 mg twice a day.   Medication Adjustments/Labs and Tests Ordered: Current medicines are reviewed at length with the patient today.  Concerns regarding medicines are outlined above.  Medication changes, Labs and Tests ordered today are listed in the Patient Instructions below. There are no Patient Instructions on file for this visit.   Signed, [name], MD  12/14/2016 4:01 PM    [facility] [address], [place], [place]  [address] Phone: [phone]; Fax: [phone]

## 2016-12-14 ENCOUNTER — Encounter: Payer: Self-pay | Admitting: Interventional Cardiology

## 2016-12-14 ENCOUNTER — Ambulatory Visit (INDEPENDENT_AMBULATORY_CARE_PROVIDER_SITE_OTHER): Payer: 59 | Admitting: Interventional Cardiology

## 2016-12-14 VITALS — BP 116/70 | HR 74 | Ht 74.0 in | Wt 265.0 lb

## 2016-12-14 DIAGNOSIS — E7849 Other hyperlipidemia: Secondary | ICD-10-CM

## 2016-12-14 DIAGNOSIS — I2510 Atherosclerotic heart disease of native coronary artery without angina pectoris: Secondary | ICD-10-CM

## 2016-12-14 DIAGNOSIS — I10 Essential (primary) hypertension: Secondary | ICD-10-CM

## 2016-12-14 DIAGNOSIS — F17200 Nicotine dependence, unspecified, uncomplicated: Secondary | ICD-10-CM

## 2016-12-14 DIAGNOSIS — E1142 Type 2 diabetes mellitus with diabetic polyneuropathy: Secondary | ICD-10-CM

## 2016-12-14 DIAGNOSIS — E784 Other hyperlipidemia: Secondary | ICD-10-CM | POA: Diagnosis not present

## 2016-12-14 DIAGNOSIS — J410 Simple chronic bronchitis: Secondary | ICD-10-CM | POA: Diagnosis not present

## 2016-12-14 DIAGNOSIS — R0989 Other specified symptoms and signs involving the circulatory and respiratory systems: Secondary | ICD-10-CM

## 2016-12-14 DIAGNOSIS — I480 Paroxysmal atrial fibrillation: Secondary | ICD-10-CM

## 2016-12-14 MED ORDER — VALSARTAN 80 MG PO TABS: 80.0000 mg | ORAL_TABLET | Freq: Every day | ORAL | 3 refills | Status: DC

## 2016-12-14 MED ORDER — SOTALOL HCL 80 MG PO TABS: ORAL_TABLET | ORAL | 2 refills | Status: DC

## 2016-12-14 NOTE — Patient Instructions (Signed)
Medication Instructions:  None  Labwork: None  Testing/Procedures: None  Follow-Up: Your physician wants you to follow-up in: 9-12 months with Dr. Smith.  You will receive a reminder letter in the mail two months in advance. If you don't receive a letter, please call our office to schedule the follow-up appointment.   Any Other Special Instructions Will Be Listed Below (If Applicable).     If you need a refill on your cardiac medications before your next appointment, please call your pharmacy.   

## 2016-12-25 IMAGING — CR DG FOOT COMPLETE 3+V*L*
3 series · 3 of 3 positions shown · non-contrast
Comparison: None.

CLINICAL DATA: Status post fall from a step yesterday with
persistent lateral foot pain and inability to bear weight

EXAM:
LEFT FOOT - COMPLETE 3+ VIEW

[foot ap]
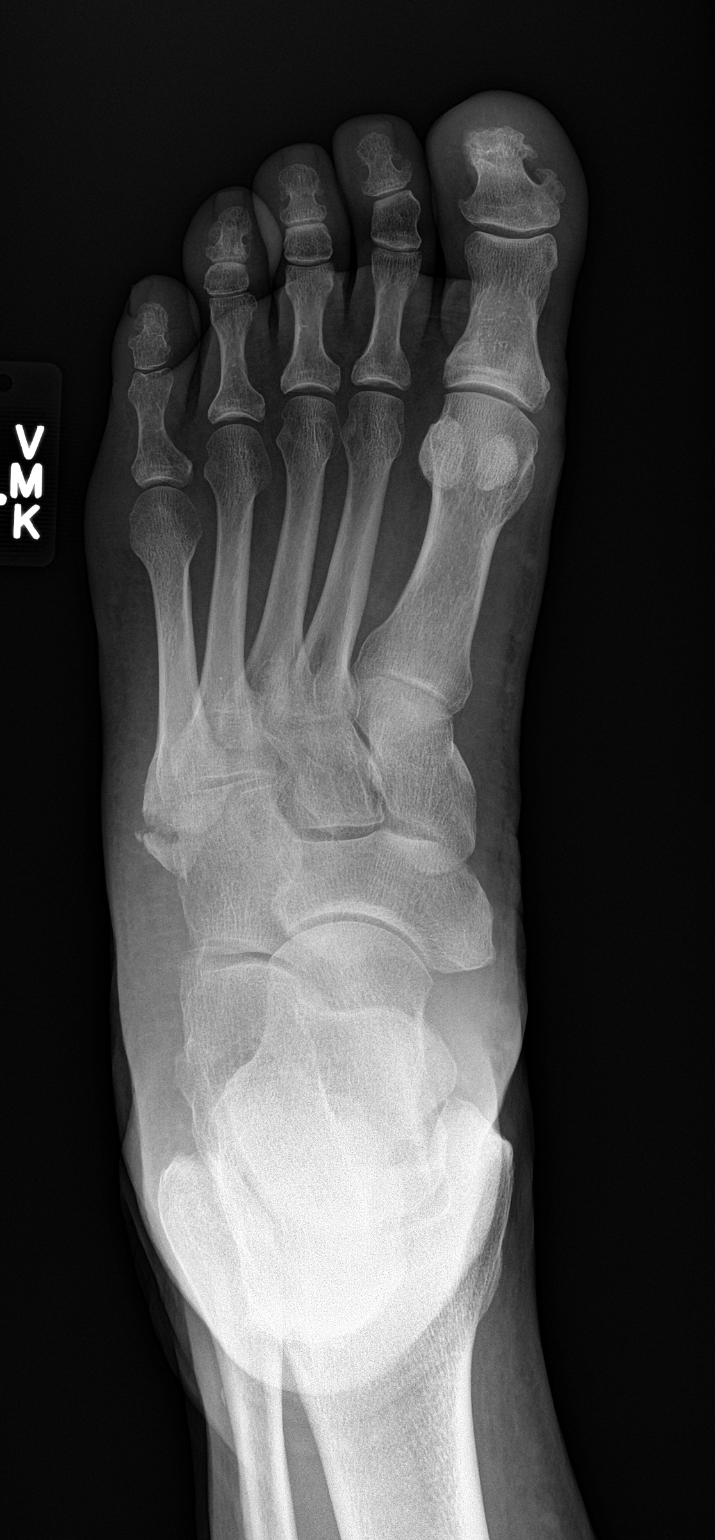

[foot obl]
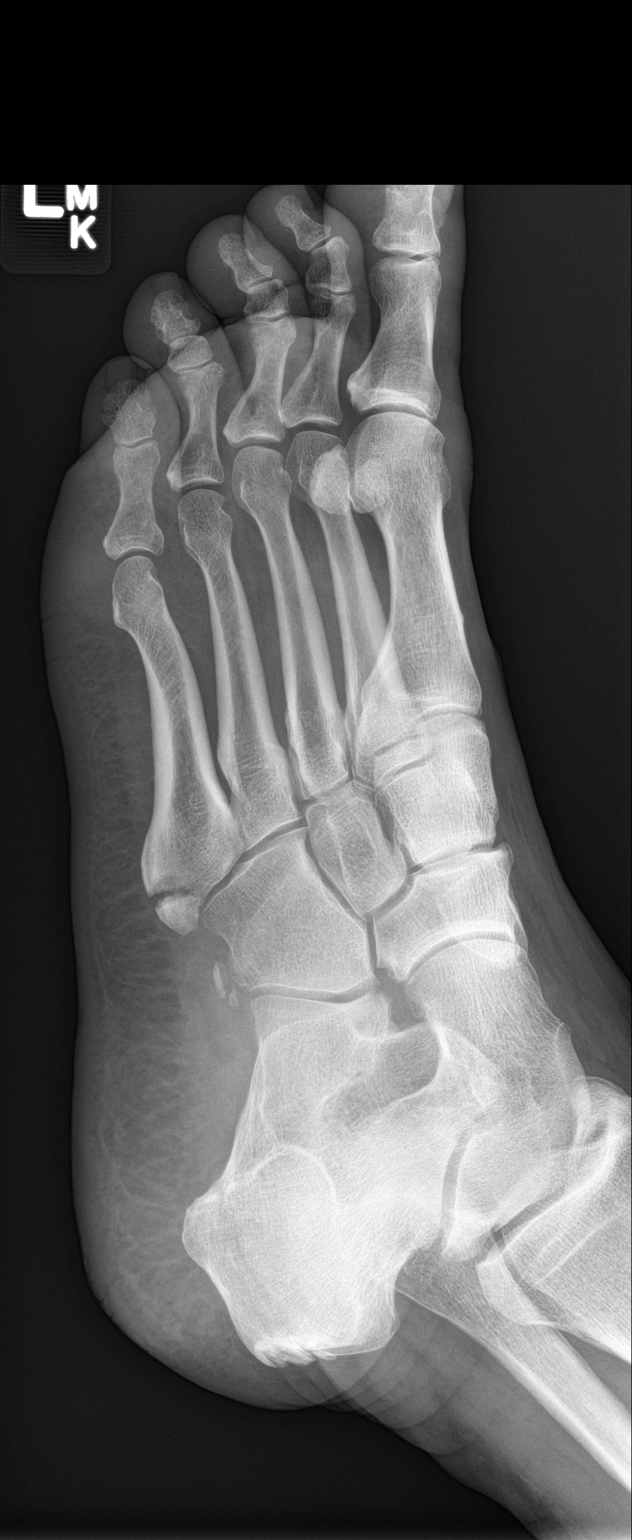

[foot lat]
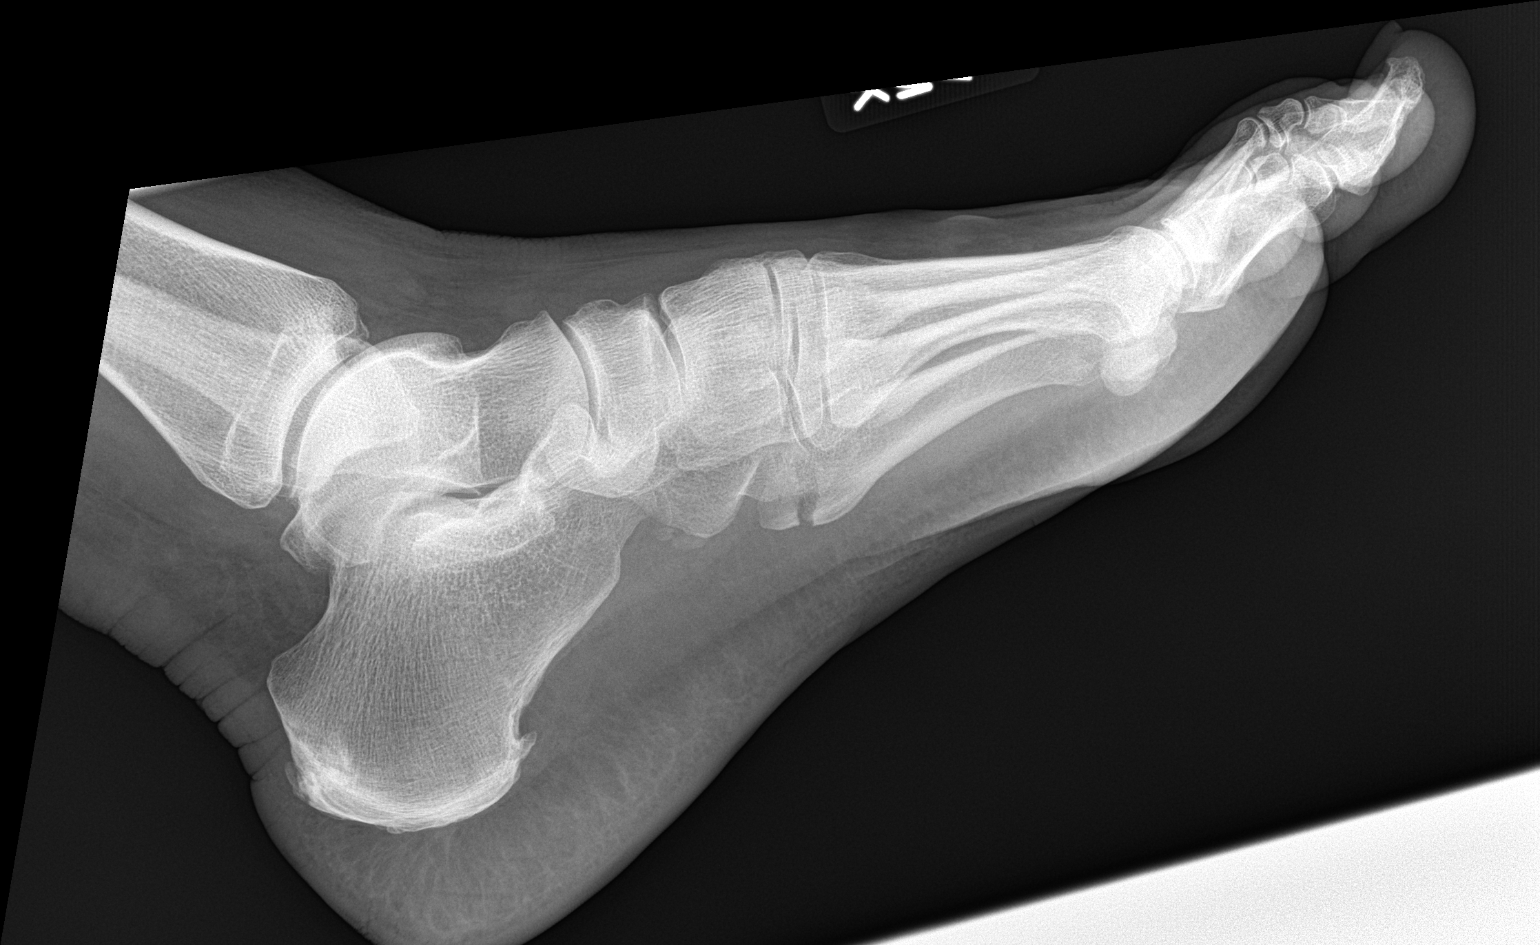

[3 of 3 positions shown; findings below may reference images not displayed]

FINDINGS: The bones of the foot are adequately mineralized. There is a mildly
distracted transversely oriented fracture through the base of the
fifth metatarsal. The remainder of the fifth metatarsal is intact.
The other metatarsals also are intact. The phalanges,
interphalangeal joints, and metatarsophalangeal joints are
unremarkable. The tarsometatarsal and intertarsal joints also are
normal. There are small plantar and Achilles region calcaneal spurs.
IMPRESSION: There is an acute mildly distracted transversely oriented fracture
through the base of the fifth metatarsal.

## 2017-01-20 ENCOUNTER — Other Ambulatory Visit: Payer: Self-pay | Admitting: Family Medicine

## 2017-01-31 ENCOUNTER — Other Ambulatory Visit: Payer: Self-pay | Admitting: Family Medicine

## 2017-02-04 ENCOUNTER — Other Ambulatory Visit: Payer: Self-pay | Admitting: Family Medicine

## 2017-02-22 DIAGNOSIS — G4733 Obstructive sleep apnea (adult) (pediatric): Secondary | ICD-10-CM | POA: Diagnosis not present

## 2017-02-24 ENCOUNTER — Other Ambulatory Visit: Payer: Self-pay | Admitting: Family Medicine

## 2017-02-27 ENCOUNTER — Other Ambulatory Visit: Payer: Self-pay | Admitting: Interventional Cardiology

## 2017-03-04 ENCOUNTER — Other Ambulatory Visit: Payer: Self-pay | Admitting: Family Medicine

## 2017-03-19 ENCOUNTER — Other Ambulatory Visit: Payer: Self-pay | Admitting: Family Medicine

## 2017-04-09 ENCOUNTER — Encounter: Payer: Self-pay | Admitting: Family Medicine

## 2017-04-09 ENCOUNTER — Ambulatory Visit (INDEPENDENT_AMBULATORY_CARE_PROVIDER_SITE_OTHER): Payer: 59 | Admitting: Family Medicine

## 2017-04-09 VITALS — BP 102/62 | Temp 98.1°F | Ht 72.0 in | Wt 256.4 lb

## 2017-04-09 DIAGNOSIS — Z0000 Encounter for general adult medical examination without abnormal findings: Secondary | ICD-10-CM

## 2017-04-09 DIAGNOSIS — L570 Actinic keratosis: Secondary | ICD-10-CM

## 2017-04-09 LAB — CBC WITH DIFFERENTIAL/PLATELET
Basophils Absolute: 0.1 10*3/uL (ref 0.0–0.1)
Basophils Relative: 1 % (ref 0.0–3.0)
Eosinophils Absolute: 0.4 10*3/uL (ref 0.0–0.7)
Eosinophils Relative: 4.9 % (ref 0.0–5.0)
HCT: 46.8 % (ref 39.0–52.0)
Hemoglobin: 15.9 g/dL (ref 13.0–17.0)
Lymphocytes Relative: 29.6 % (ref 12.0–46.0)
Lymphs Abs: 2.7 10*3/uL (ref 0.7–4.0)
MCHC: 34 g/dL (ref 30.0–36.0)
MCV: 90 fl (ref 78.0–100.0)
Monocytes Absolute: 0.9 10*3/uL (ref 0.1–1.0)
Monocytes Relative: 9.7 % (ref 3.0–12.0)
Neutro Abs: 4.9 10*3/uL (ref 1.4–7.7)
Neutrophils Relative %: 54.8 % (ref 43.0–77.0)
Platelets: 264 10*3/uL (ref 150.0–400.0)
RBC: 5.2 Mil/uL (ref 4.22–5.81)
RDW: 13 % (ref 11.5–15.5)
WBC: 9 10*3/uL (ref 4.0–10.5)

## 2017-04-09 LAB — MICROALBUMIN / CREATININE URINE RATIO
Creatinine,U: 144.6 mg/dL
Microalb Creat Ratio: 1 mg/g (ref 0.0–30.0)
Microalb, Ur: 1.5 mg/dL (ref 0.0–1.9)

## 2017-04-09 LAB — BASIC METABOLIC PANEL
BUN: 14 mg/dL (ref 6–23)
CO2: 29 mEq/L (ref 19–32)
Calcium: 9.6 mg/dL (ref 8.4–10.5)
Chloride: 102 mEq/L (ref 96–112)
Creatinine, Ser: 0.8 mg/dL (ref 0.40–1.50)
GFR: 104.71 mL/min (ref >60.00)
Glucose, Bld: 135 mg/dL — ABNORMAL HIGH (ref 70–99)
Potassium: 4.4 mEq/L (ref 3.5–5.1)
Sodium: 139 mEq/L (ref 135–145)

## 2017-04-09 LAB — LIPID PANEL
Cholesterol: 105 mg/dL (ref 0–200)
HDL: 32.9 mg/dL — ABNORMAL LOW (ref >39.00)
NonHDL: 72.23
Total CHOL/HDL Ratio: 3
Triglycerides: 307 mg/dL — ABNORMAL HIGH (ref 0.0–149.0)
VLDL: 61.4 mg/dL — ABNORMAL HIGH (ref 0.0–40.0)

## 2017-04-09 LAB — HEPATIC FUNCTION PANEL
ALT: 27 U/L (ref 0–53)
AST: 16 U/L (ref 0–37)
Albumin: 4.3 g/dL (ref 3.5–5.2)
Alkaline Phosphatase: 58 U/L (ref 39–117)
Bilirubin, Direct: 0.1 mg/dL (ref 0.0–0.3)
Total Bilirubin: 0.8 mg/dL (ref 0.2–1.2)
Total Protein: 6.7 g/dL (ref 6.0–8.3)

## 2017-04-09 LAB — PSA: PSA: 0.43 ng/mL (ref 0.10–4.00)

## 2017-04-09 LAB — HEMOGLOBIN A1C: Hgb A1c MFr Bld: 7.2 % — ABNORMAL HIGH (ref 4.6–6.5)

## 2017-04-09 LAB — LDL CHOLESTEROL, DIRECT: Direct LDL: 45 mg/dL

## 2017-04-09 LAB — TSH: TSH: 1.15 u[IU]/mL (ref 0.35–4.50)

## 2017-04-09 NOTE — Progress Notes (Signed)
Subjective:     Patient ID: Eric Palmer, male   DOB: 09/19/1955, 61 y.o.   MRN: [id number]  HPI Patient here for physical exam. He has multiple chronic problems including history of obesity, atrial fibrillation, hypertension, CAD, obstructive sleep apnea, COPD, type 2 diabetes with peripheral neuropathy, metabolic syndrome. Quit smoking 2 years ago  Capillary blood glucose fasting has been ranging around 100 to 150. Patient had shingles vaccine one year ago. Last A1c 7.5%. Needs to set up eye exam. Colonoscopy up-to-date  Past Medical History:  Diagnosis Date  . Anal fissure   . Chronic bronchitis ([place])    "get it q spring and fall" (10/27/2013)  . Coronary artery disease    a. s/p PCI in 1996. b. LHC 01/2006 showed 50% mid LAD, otherwise widely patent cors with minimal irregularities. c. Nuclear stress test in 04/2009 was reportedly low risk, no ischemia.  . DM type 2, uncontrolled, with neuropathy ([place])   . ED (erectile dysfunction)   . Essential hypertension   . Hx of echocardiogram    Echo (10/2013): Mild LVH, moderate focal basal hypertrophy of the septum, EF 50-55%, no RWMA  . Hyperlipidemia   . Internal hemorrhoids without mention of complication   . Obesity   . Obesity   . OSA on CPAP   . Paresthesia of both legs   . Paroxysmal atrial fibrillation ([name])    a. failed Tikosyn, Multaq. b. s/p ablation of AF/AFL in 12/2013.  [name] Paroxysmal atrial flutter ([place])    a. s/p ablation of AF/AFL in 12/2013.  [name] Smoker    QUIT 10/13/13   Past Surgical History:  Procedure Laterality Date  . ABLATION  01-12-2014   PVI and CTI by Dr [name]  . ANAL FISSURE REPAIR  ~ 2010  . ATRIAL FIBRILLATION ABLATION N/A 01/12/2014   Procedure: ATRIAL FIBRILLATION ABLATION;  Surgeon: Coralyn Mark, MD;  Location: [facility];  Service: Cardiovascular;  Laterality: N/A;  . CARDIAC CATHETERIZATION  2007  . CORONARY ANGIOPLASTY WITH STENT PLACEMENT  1996   residual 50% LAD and RCA by cath in 2007  .  CORONARY ANGIOPLASTY WITH STENT PLACEMENT  1995  . INGUINAL HERNIA REPAIR Right 1963  . TEE WITHOUT CARDIOVERSION N/A 01/11/2014   Procedure: TRANSESOPHAGEAL ECHOCARDIOGRAM (TEE);  Surgeon: [name], MD;  Location: [facility];  Service: Cardiovascular;  Laterality: N/A;  . [place]    reports that he has quit smoking. His smoking use included Cigarettes. He smoked 0.00 packs per day for 35.00 years. He has never used smokeless tobacco. He reports that he drinks alcohol. He reports that he does not use drugs. family history includes Heart attack in his father; Heart disease in his other; Heart disease (age of onset: 42) in his father; Heart disease (age of onset: 4) in his mother; Hyperlipidemia in his other; Hypertension in his mother. Allergies  Allergen Reactions  . Cephalosporins Anaphylaxis  . Penicillins Anaphylaxis  . Codeine Itching  . Gabapentin Other (See Comments)    "made me feel drugged"  . Eliquis [Apixaban]     Diarrhea     Review of Systems  Constitutional: Negative for activity change, appetite change, fatigue and fever.  HENT: Negative for congestion, ear pain and trouble swallowing.   Eyes: Negative for pain and visual disturbance.  Respiratory: Negative for cough, shortness of breath and wheezing.   Cardiovascular: Negative for chest pain and palpitations.  Gastrointestinal: Negative for abdominal distention, abdominal pain, blood  in stool, constipation, diarrhea, nausea, rectal pain and vomiting.  Endocrine: Negative for polydipsia and polyuria.  Genitourinary: Negative for dysuria, hematuria and testicular pain.  Musculoskeletal: Negative for arthralgias and joint swelling.  Skin: Negative for rash.  Neurological: Negative for dizziness, syncope and headaches.  Hematological: Negative for adenopathy.  Psychiatric/Behavioral: Negative for confusion and dysphoric mood.       Objective:   Physical Exam  Constitutional: He  is oriented to person, place, and time. He appears well-developed and well-nourished. No distress.  HENT:  Head: Normocephalic and atraumatic.  Right Ear: External ear normal.  Left Ear: External ear normal.  Mouth/Throat: Oropharynx is clear and moist.  Eyes: Pupils are equal, round, and reactive to light. Conjunctivae and EOM are normal.  Neck: Normal range of motion. Neck supple. No thyromegaly present.  Cardiovascular: Normal rate, regular rhythm and normal heart sounds.   No murmur heard. Pulmonary/Chest: No respiratory distress. He has no wheezes. He has no rales.  Abdominal: Soft. Bowel sounds are normal. He exhibits no distension and no mass. There is no tenderness. There is no rebound and no guarding.  Musculoskeletal: He exhibits no edema.  Lymphadenopathy:    He has no cervical adenopathy.  Neurological: He is alert and oriented to person, place, and time. He displays normal reflexes. No cranial nerve deficit.  Skin:  Patient has some keratoses on his forearms-neck seborrheic keratoses and probably some early actinic keratoses  Psychiatric: He has a normal mood and affect.       Assessment:     Physical exam-multiple stable chronic medical problems as above    Plan:     -Screening lab work including A1c, urine microalbumin, hepatitis C antibody, PSA, lipid, hepatic, basic metabolic panel, CBC, TSH -We discussed low-dose lung cancer screening. He meets criteria in terms of quitting smoking 2 years ago, age (54), and 30+ pack year history.  Referral made after pt consent. -Needs to set up diabetic eye exam  [name] MD [facility]

## 2017-04-10 LAB — HEPATITIS C ANTIBODY: HCV Ab: NONREACTIVE

## 2017-04-13 ENCOUNTER — Other Ambulatory Visit: Payer: Self-pay | Admitting: Acute Care

## 2017-04-13 DIAGNOSIS — F17210 Nicotine dependence, cigarettes, uncomplicated: Secondary | ICD-10-CM

## 2017-04-13 DIAGNOSIS — Z122 Encounter for screening for malignant neoplasm of respiratory organs: Secondary | ICD-10-CM

## 2017-04-16 ENCOUNTER — Encounter: Payer: Self-pay | Admitting: Acute Care

## 2017-04-16 ENCOUNTER — Ambulatory Visit (INDEPENDENT_AMBULATORY_CARE_PROVIDER_SITE_OTHER): Payer: 59 | Admitting: Acute Care

## 2017-04-16 ENCOUNTER — Ambulatory Visit (INDEPENDENT_AMBULATORY_CARE_PROVIDER_SITE_OTHER)
Admission: RE | Admit: 2017-04-16 | Discharge: 2017-04-16 | Disposition: A | Discharge status: Home / Self Care | Payer: 59 | Source: Ambulatory Visit | Attending: Acute Care | Admitting: Acute Care

## 2017-04-16 DIAGNOSIS — Z87891 Personal history of nicotine dependence: Secondary | ICD-10-CM

## 2017-04-16 DIAGNOSIS — F17210 Nicotine dependence, cigarettes, uncomplicated: Secondary | ICD-10-CM | POA: Diagnosis not present

## 2017-04-16 DIAGNOSIS — Z122 Encounter for screening for malignant neoplasm of respiratory organs: Secondary | ICD-10-CM

## 2017-04-16 NOTE — Progress Notes (Signed)
Shared Decision Making Visit Lung Cancer Screening Program [phone])   Eligibility:  Age 61 y.o.  Pack Years Smoking History Calculation 43-pack-year smoking history (# packs/per year x # years smoked)  Recent History of coughing up blood  no  Unexplained weight loss? no ( >Than 15 pounds within the last 6 months )  Prior History Lung / other cancer no (Diagnosis within the last 5 years already requiring surveillance chest CT Scans).  Smoking Status Current Smoker  Former Smokers: Years since quit: NA  Quit Date: NA  Visit Components:  Discussion included one or more decision making aids. yes  Discussion included risk/benefits of screening. yes  Discussion included potential follow up diagnostic testing for abnormal scans. yes  Discussion included meaning and risk of over diagnosis. yes  Discussion included meaning and risk of False Positives. yes  Discussion included meaning of total radiation exposure. yes  Counseling Included:  Importance of adherence to annual lung cancer LDCT screening. yes  Impact of comorbidities on ability to participate in the program. yes  Ability and willingness to under diagnostic treatment. yes  Smoking Cessation Counseling:  Current Smokers:   Discussed importance of smoking cessation. yes  Information about tobacco cessation classes and interventions provided to patient. yes  Patient provided with "ticket" for LDCT Scan. yes  Symptomatic Patient. no  Counseling: NA  Diagnosis Code: Tobacco Use Z72.0  Asymptomatic Patient yes  Counseling (Intermediate counseling: > three minutes counseling) N0272  Former Smokers:   Discussed the importance of maintaining cigarette abstinence. yes  Diagnosis Code: Personal History of Nicotine Dependence. Z36.644  Information about tobacco cessation classes and interventions provided to patient. Yes  Patient provided with "ticket" for LDCT Scan. yes  Written Order for Lung Cancer  Screening with LDCT placed in Epic. Yes (CT Chest Lung Cancer Screening Low Dose W/O CM) [id number] Z12.2-Screening of respiratory organs Z87.891-Personal history of nicotine dependence  I have spent 25 minutes of face to face time with Mr. Gaut discussing the risks and benefits of lung cancer screening. We viewed a power point together that explained in detail the above noted topics. We paused at intervals to allow for questions to be asked and answered to ensure understanding.We discussed that the single most powerful action that he  can take to decrease his risk of developing lung cancer is to quit smoking. We discussed whether or not he is ready to commit to setting a quit date. He is currently not ready to set a quit date. We discussed options for tools to aid in quitting smoking including nicotine replacement therapy, non-nicotine medications, support groups, Quit Smart classes, and behavior modification. We discussed that often times setting smaller, more achievable goals, such as eliminating 1 cigarette a day for a week and then 2 cigarettes a day for a week can be helpful in slowly decreasing the number of cigarettes smoked. This allows for a sense of accomplishment as well as providing a clinical benefit. I gave Mr. Confer  the " Be Stronger Than Your Excuses" card with contact information for community resources, classes, free nicotine replacement therapy, and access to mobile apps, text messaging, and on-line smoking cessation help. I have also given him my card and contact information in the event he needs to contact me. We discussed the time and location of the scan, and that either Doroteo Glassman RN or I will call with the results within 24-48 hours of receiving them. I have offered him   a copy of the power point  we viewed  as a resource in the event they need reinforcement of the concepts we discussed today in the office. The patient verbalized understanding of all of  the above and had no  further questions upon leaving the office. They have my contact information in the event they have any further questions.  I spent 4 minutes counseling on smoking cessation and the health risks of continued tobacco abuse.  I explained to the patient that there has been a high incidence of coronary artery disease noted on these exams. I explained that this is a non-gated exam therefore degree or severity cannot be determined. This patient is currently on statin therapy, and is seen by Dr. Daneen Schick , cardiologist.. I have asked the patient to follow-up with their PCP regarding any incidental finding of coronary artery disease and management with diet or medication as their PCP  feels is clinically indicated. The patient verbalized understanding of the above and had no further questions upon completion of the visit.   Magdalen Spatz, NP 04/16/2017

## 2017-04-22 ENCOUNTER — Telehealth: Payer: Self-pay | Admitting: Family Medicine

## 2017-04-22 ENCOUNTER — Other Ambulatory Visit: Payer: Self-pay | Admitting: Acute Care

## 2017-04-22 DIAGNOSIS — Z122 Encounter for screening for malignant neoplasm of respiratory organs: Secondary | ICD-10-CM

## 2017-04-22 DIAGNOSIS — F17210 Nicotine dependence, cigarettes, uncomplicated: Secondary | ICD-10-CM

## 2017-04-22 NOTE — Telephone Encounter (Signed)
Pt is calling concerning a Physical form that he gave that was not completed per a email that he had received from his employer and would like to see if it could be completed and refaxed.  I found the form on Rachel's desk and will call the pt back to see exactly what is needed on the form.

## 2017-04-22 NOTE — Telephone Encounter (Signed)
Called pt and he will come by today to resign so that we can re-fax the form.

## 2017-04-28 ENCOUNTER — Telehealth: Payer: Self-pay | Admitting: Family Medicine

## 2017-04-28 NOTE — Telephone Encounter (Signed)
Paperwork was re-faxed for pt

## 2017-05-03 ENCOUNTER — Other Ambulatory Visit: Payer: Self-pay | Admitting: Family Medicine

## 2017-05-08 ENCOUNTER — Other Ambulatory Visit: Payer: Self-pay | Admitting: Family Medicine

## 2017-05-10 NOTE — Telephone Encounter (Signed)
Rx done. 

## 2017-05-10 NOTE — Telephone Encounter (Signed)
Okay to fill [facility]?  Last refill 12/09/15 and last office visit 04/09/17.

## 2017-05-10 NOTE — Telephone Encounter (Signed)
Refill for 6 months. 

## 2017-05-13 ENCOUNTER — Encounter: Payer: Self-pay | Admitting: Family Medicine

## 2017-05-20 ENCOUNTER — Ambulatory Visit (INDEPENDENT_AMBULATORY_CARE_PROVIDER_SITE_OTHER): Payer: 59 | Admitting: Adult Health

## 2017-05-20 ENCOUNTER — Encounter: Payer: Self-pay | Admitting: Adult Health

## 2017-05-20 VITALS — BP 116/62 | Temp 98.1°F | Wt 254.0 lb

## 2017-05-20 DIAGNOSIS — L821 Other seborrheic keratosis: Secondary | ICD-10-CM | POA: Diagnosis not present

## 2017-05-20 DIAGNOSIS — J0141 Acute recurrent pansinusitis: Secondary | ICD-10-CM | POA: Diagnosis not present

## 2017-05-20 DIAGNOSIS — L820 Inflamed seborrheic keratosis: Secondary | ICD-10-CM | POA: Diagnosis not present

## 2017-05-20 DIAGNOSIS — D225 Melanocytic nevi of trunk: Secondary | ICD-10-CM | POA: Diagnosis not present

## 2017-05-20 MED ORDER — METHYLPREDNISOLONE ACETATE 40 MG/ML IJ SUSP
40.0000 mg | Freq: Once | INTRAMUSCULAR | Status: AC
  Administered 2017-05-20: 40 mg via INTRAMUSCULAR

## 2017-05-20 MED ORDER — AZITHROMYCIN 250 MG PO TABS: ORAL_TABLET | ORAL | 0 refills | Status: DC

## 2017-05-20 NOTE — Progress Notes (Signed)
Subjective:    Patient ID: Eric Palmer, male    DOB: April 12, 1956, 61 y.o.   MRN: [id number]  Sinusitis  This is a new problem. The current episode started in the past 7 days. The problem has been gradually worsening since onset. There has been no fever. Associated symptoms include congestion, coughing, ear pain, headaches, sinus pressure and a sore throat. Pertinent negatives include no chills, diaphoresis or shortness of breath. Treatments tried: zyrtec  The treatment provided no relief.    Review of Systems  Constitutional: Positive for activity change. Negative for chills, diaphoresis and fever.  HENT: Positive for congestion, ear pain, postnasal drip, rhinorrhea, sinus pain, sinus pressure and sore throat. Negative for ear discharge, trouble swallowing and voice change.   Respiratory: Positive for cough. Negative for shortness of breath.   Cardiovascular: Negative.   Neurological: Positive for headaches.   Past Medical History:  Diagnosis Date  . Anal fissure   . Chronic bronchitis ([place])    "get it q spring and fall" (10/27/2013)  . Coronary artery disease    a. s/p PCI in 1996. b. LHC 01/2006 showed 50% mid LAD, otherwise widely patent cors with minimal irregularities. c. Nuclear stress test in 04/2009 was reportedly low risk, no ischemia.  . DM type 2, uncontrolled, with neuropathy ([place])   . ED (erectile dysfunction)   . Essential hypertension   . Hx of echocardiogram    Echo (10/2013): Mild LVH, moderate focal basal hypertrophy of the septum, EF 50-55%, no RWMA  . Hyperlipidemia   . Internal hemorrhoids without mention of complication   . Obesity   . Obesity   . OSA on CPAP   . Paresthesia of both legs   . Paroxysmal atrial fibrillation ([name])    a. failed Tikosyn, Multaq. b. s/p ablation of AF/AFL in 12/2013.  [name] Paroxysmal atrial flutter ([place])    a. s/p ablation of AF/AFL in 12/2013.  [name] Smoker    QUIT 10/13/13    Social History   Social History  . Marital status:  Married    Spouse name: N/A  . Number of children: N/A  . Years of education: N/A   Occupational History  . Not on file.   Social History Main Topics  . Smoking status: Current Every Day Smoker    Packs/day: 1.00    Years: 43.00    Types: Cigarettes  . Smokeless tobacco: Never Used  . Alcohol use 0.0 oz/week     Comment: 10/27/2013 "might have 1 beer/month"  . Drug use: No  . Sexual activity: Yes   Other Topics Concern  . Not on file   Social History Narrative   Works as an Programme researcher, broadcasting/film/video for [employer]    Past Surgical History:  Procedure Laterality Date  . ABLATION  01-12-2014   PVI and CTI by Dr [name]  . ANAL FISSURE REPAIR  ~ 2010  . ATRIAL FIBRILLATION ABLATION N/A 01/12/2014   Procedure: ATRIAL FIBRILLATION ABLATION;  Surgeon: Coralyn Mark, MD;  Location: [facility];  Service: Cardiovascular;  Laterality: N/A;  . CARDIAC CATHETERIZATION  2007  . CORONARY ANGIOPLASTY WITH STENT PLACEMENT  1996   residual 50% LAD and RCA by cath in 2007  . CORONARY ANGIOPLASTY WITH STENT PLACEMENT  1995  . INGUINAL HERNIA REPAIR Right 1963  . TEE WITHOUT CARDIOVERSION N/A 01/11/2014   Procedure: TRANSESOPHAGEAL ECHOCARDIOGRAM (TEE);  Surgeon: [name], MD;  Location: [facility];  Service: Cardiovascular;  Laterality: N/A;  . TONSILLECTOMY  AND ADENOIDECTOMY  1979    Family History  Problem Relation Age of Onset  . Heart disease Mother 86       Arrhythmia  . Hypertension Mother   . Heart disease Father 15       died 60 CHF  . Heart attack Father   . Hyperlipidemia Other   . Heart disease Other   . Stroke Neg Hx     Allergies  Allergen Reactions  . Cephalosporins Anaphylaxis  . Penicillins Anaphylaxis  . Codeine Itching  . Gabapentin Other (See Comments)    "made me feel drugged"  . Eliquis [Apixaban]     Diarrhea    Current Outpatient Prescriptions on File Prior to Visit  Medication Sig Dispense Refill  . ADVAIR DISKUS 100-50 MCG/DOSE AEPB INHALE 1 PUFF INTO  THE LUNGS TWICE DAILY 60 each 5  . atorvastatin (LIPITOR) 40 MG tablet Take 1 tablet (40 mg total) by mouth daily at 6 PM. 90 tablet 2  . diltiazem (CARDIZEM CD) 360 MG 24 hr capsule Take 1 capsule (360 mg total) by mouth daily. 90 capsule 3  . fish oil-omega-3 fatty acids 1000 MG capsule Take 1 g by mouth 2 (two) times daily.     [name] LYRICA 100 MG capsule TAKE 1 CAPSULE BY MOUTH EVERY MORNING, 1 CAPSULE AT NOON, AND 2 CAPSULES EVERY NIGHT AT BEDTIME 360 capsule 5  . metFORMIN (GLUCOPHAGE-XR) 750 MG 24 hr tablet TAKE 1 TABLET BY MOUTH TWICE DAILY 180 tablet 1  . nitroGLYCERIN (NITROSTAT) 0.4 MG SL tablet Place 0.4 mg under the tongue every 5 (five) minutes x 3 doses as needed for chest pain (Call 911 at 3rd dose within 15 minutes).    [name] DELICA LANCETS FINE MISC USE TO CHECK BLOOD SUGAR ONCE DAILY 100 each 0  . ONETOUCH VERIO test strip USE TO CHECK BLOOD SUGAR ONCE DAILY 100 each 1  . PROAIR HFA 108 (90 Base) MCG/ACT inhaler INHALE 2 PUFFS BY MOUTH EVERY 4 HOURS AS NEEDED FOR WHEEZING OR SHORTNESS OF BREATH 8.5 g 5  . sotalol (BETAPACE) 80 MG tablet TAKE 1 TABLET(80 MG) BY MOUTH TWICE DAILY 180 tablet 2  . XARELTO 20 MG TABS tablet TAKE 1 TABLET(20 MG) BY MOUTH DAILY WITH SUPPER 90 tablet 1   No current facility-administered medications on file prior to visit.     BP 116/62 (BP Location: Left Arm)   Temp 98.1 F (36.7 C) (Oral)   Wt 254 lb (115.2 kg)   BMI 34.45 kg/m       Objective:   Physical Exam  Constitutional: He is oriented to person, place, and time. He appears well-developed and well-nourished. He appears ill. No distress.  HENT:  Head: Normocephalic and atraumatic.  Right Ear: Hearing, external ear and ear canal normal. Tympanic membrane is bulging. Tympanic membrane is not erythematous.  Left Ear: Hearing, external ear and ear canal normal. Tympanic membrane is bulging. Tympanic membrane is not erythematous.  Nose: Mucosal edema and rhinorrhea present. Right sinus  exhibits maxillary sinus tenderness and frontal sinus tenderness. Left sinus exhibits frontal sinus tenderness.  Mouth/Throat: Uvula is midline. Posterior oropharyngeal erythema (mild ) present. No oropharyngeal exudate.  Eyes: Pupils are equal, round, and reactive to light. Conjunctivae and EOM are normal. Right eye exhibits no discharge. Left eye exhibits no discharge. No scleral icterus.  Neck: Normal range of motion. Neck supple. No thyromegaly present.  Cardiovascular: Normal rate, regular rhythm, normal heart sounds and intact distal pulses.  Exam reveals no friction rub.   No murmur heard. Pulmonary/Chest: Effort normal and breath sounds normal. No respiratory distress. He has no wheezes. He has no rales. He exhibits no tenderness.  Lymphadenopathy:    He has no cervical adenopathy.  Neurological: He is alert and oriented to person, place, and time.  Skin: Skin is warm and dry. No rash noted. He is not diaphoretic. No erythema. No pallor.  Psychiatric: He has a normal mood and affect. His behavior is normal. Judgment and thought content normal.  Vitals reviewed.      Assessment & Plan:  1. Acute recurrent pansinusitis - azithromycin (ZITHROMAX Z-PAK) 250 MG tablet; Take 2 tablets on Day 1.  Then take 1 tablet daily.  Dispense: 6 tablet; Refill: 0 - methylPREDNISolone acetate (DEPO-MEDROL) injection 40 mg; Inject 1 mL (40 mg total) into the muscle once. - Follow up if no improvement in the next 2-3 days or sooner if fever develops    Dorothyann Peng, NP

## 2017-05-31 DIAGNOSIS — G4733 Obstructive sleep apnea (adult) (pediatric): Secondary | ICD-10-CM | POA: Diagnosis not present

## 2017-06-03 ENCOUNTER — Other Ambulatory Visit: Payer: Self-pay | Admitting: Interventional Cardiology

## 2017-06-03 DIAGNOSIS — I480 Paroxysmal atrial fibrillation: Secondary | ICD-10-CM

## 2017-06-03 NOTE — Telephone Encounter (Signed)
Pt last saw Dr Tamala Julian 12/14/16, last labs 04/09/17 Creat 0.80, age 61, weight 115.2kg, CrCl 160, based on CrCl pt is on appropriate dosage of Xarelto 20mg  QD.  Will refill rx.

## 2017-06-10 DIAGNOSIS — H35361 Drusen (degenerative) of macula, right eye: Secondary | ICD-10-CM | POA: Diagnosis not present

## 2017-06-10 DIAGNOSIS — E119 Type 2 diabetes mellitus without complications: Secondary | ICD-10-CM | POA: Diagnosis not present

## 2017-06-10 DIAGNOSIS — H2513 Age-related nuclear cataract, bilateral: Secondary | ICD-10-CM | POA: Diagnosis not present

## 2017-06-10 DIAGNOSIS — H25013 Cortical age-related cataract, bilateral: Secondary | ICD-10-CM | POA: Diagnosis not present

## 2017-06-10 LAB — HM DIABETES EYE EXAM

## 2017-06-21 ENCOUNTER — Encounter: Payer: Self-pay | Admitting: Family Medicine

## 2017-06-23 ENCOUNTER — Other Ambulatory Visit: Payer: Self-pay | Admitting: Family Medicine

## 2017-07-05 ENCOUNTER — Other Ambulatory Visit: Payer: Self-pay | Admitting: Family Medicine

## 2017-07-14 ENCOUNTER — Other Ambulatory Visit: Payer: Self-pay | Admitting: Family Medicine

## 2017-07-21 ENCOUNTER — Other Ambulatory Visit: Payer: Self-pay | Admitting: Family Medicine

## 2017-07-26 ENCOUNTER — Other Ambulatory Visit: Payer: Self-pay | Admitting: Family Medicine

## 2017-07-27 ENCOUNTER — Other Ambulatory Visit: Payer: Self-pay | Admitting: Interventional Cardiology

## 2017-07-27 MED ORDER — DILTIAZEM HCL ER COATED BEADS 360 MG PO CP24: 360.0000 mg | ORAL_CAPSULE | Freq: Every day | ORAL | 1 refills | Status: DC

## 2017-08-07 ENCOUNTER — Other Ambulatory Visit: Payer: Self-pay | Admitting: Family Medicine

## 2017-08-14 ENCOUNTER — Encounter (HOSPITAL_COMMUNITY): Payer: Self-pay

## 2017-08-14 ENCOUNTER — Emergency Department (HOSPITAL_COMMUNITY): Payer: 59

## 2017-08-14 ENCOUNTER — Observation Stay (HOSPITAL_COMMUNITY)
Admission: EM | Admit: 2017-08-14 | Discharge: 2017-08-15 | Disposition: A | Discharge status: Home / Self Care | Payer: 59 | Attending: Interventional Cardiology | Admitting: Interventional Cardiology

## 2017-08-14 DIAGNOSIS — J45909 Unspecified asthma, uncomplicated: Secondary | ICD-10-CM | POA: Diagnosis present

## 2017-08-14 DIAGNOSIS — E1140 Type 2 diabetes mellitus with diabetic neuropathy, unspecified: Secondary | ICD-10-CM | POA: Diagnosis not present

## 2017-08-14 DIAGNOSIS — I10 Essential (primary) hypertension: Secondary | ICD-10-CM | POA: Diagnosis not present

## 2017-08-14 DIAGNOSIS — F17210 Nicotine dependence, cigarettes, uncomplicated: Secondary | ICD-10-CM | POA: Insufficient documentation

## 2017-08-14 DIAGNOSIS — J449 Chronic obstructive pulmonary disease, unspecified: Secondary | ICD-10-CM | POA: Diagnosis present

## 2017-08-14 DIAGNOSIS — Z7901 Long term (current) use of anticoagulants: Secondary | ICD-10-CM

## 2017-08-14 DIAGNOSIS — I480 Paroxysmal atrial fibrillation: Secondary | ICD-10-CM | POA: Diagnosis not present

## 2017-08-14 DIAGNOSIS — E669 Obesity, unspecified: Secondary | ICD-10-CM | POA: Diagnosis not present

## 2017-08-14 DIAGNOSIS — I4891 Unspecified atrial fibrillation: Secondary | ICD-10-CM | POA: Diagnosis present

## 2017-08-14 DIAGNOSIS — Z79899 Other long term (current) drug therapy: Secondary | ICD-10-CM | POA: Insufficient documentation

## 2017-08-14 DIAGNOSIS — Z6831 Body mass index (BMI) 31.0-31.9, adult: Secondary | ICD-10-CM | POA: Diagnosis not present

## 2017-08-14 DIAGNOSIS — Z955 Presence of coronary angioplasty implant and graft: Secondary | ICD-10-CM | POA: Insufficient documentation

## 2017-08-14 DIAGNOSIS — Z7984 Long term (current) use of oral hypoglycemic drugs: Secondary | ICD-10-CM | POA: Insufficient documentation

## 2017-08-14 DIAGNOSIS — E8881 Metabolic syndrome: Secondary | ICD-10-CM | POA: Diagnosis present

## 2017-08-14 DIAGNOSIS — I700 Atherosclerosis of aorta: Secondary | ICD-10-CM | POA: Insufficient documentation

## 2017-08-14 DIAGNOSIS — Z7951 Long term (current) use of inhaled steroids: Secondary | ICD-10-CM | POA: Diagnosis not present

## 2017-08-14 DIAGNOSIS — I25119 Atherosclerotic heart disease of native coronary artery with unspecified angina pectoris: Secondary | ICD-10-CM | POA: Diagnosis present

## 2017-08-14 DIAGNOSIS — R079 Chest pain, unspecified: Secondary | ICD-10-CM | POA: Diagnosis not present

## 2017-08-14 DIAGNOSIS — E119 Type 2 diabetes mellitus without complications: Secondary | ICD-10-CM

## 2017-08-14 DIAGNOSIS — I4892 Unspecified atrial flutter: Secondary | ICD-10-CM | POA: Diagnosis present

## 2017-08-14 DIAGNOSIS — E785 Hyperlipidemia, unspecified: Secondary | ICD-10-CM | POA: Diagnosis not present

## 2017-08-14 DIAGNOSIS — Z9989 Dependence on other enabling machines and devices: Secondary | ICD-10-CM | POA: Insufficient documentation

## 2017-08-14 DIAGNOSIS — G4733 Obstructive sleep apnea (adult) (pediatric): Secondary | ICD-10-CM | POA: Diagnosis present

## 2017-08-14 LAB — CBC
HCT: 46.8 % (ref 39.0–52.0)
Hemoglobin: 16.4 g/dL (ref 13.0–17.0)
MCH: 31.4 pg (ref 26.0–34.0)
MCHC: 35 g/dL (ref 30.0–36.0)
MCV: 89.7 fL (ref 78.0–100.0)
Platelets: 264 10*3/uL (ref 150–400)
RBC: 5.22 MIL/uL (ref 4.22–5.81)
RDW: 13 % (ref 11.5–15.5)
WBC: 11.8 10*3/uL — ABNORMAL HIGH (ref 4.0–10.5)

## 2017-08-14 LAB — BASIC METABOLIC PANEL
Anion gap: 13 (ref 5–15)
BUN: 20 mg/dL (ref 6–20)
CO2: 21 mmol/L — ABNORMAL LOW (ref 22–32)
Calcium: 9.6 mg/dL (ref 8.9–10.3)
Chloride: 98 mmol/L — ABNORMAL LOW (ref 101–111)
Creatinine, Ser: 0.9 mg/dL (ref 0.61–1.24)
GFR calc Af Amer: >60 mL/min (ref >60)
GFR calc non Af Amer: >60 mL/min (ref >60)
Glucose, Bld: 182 mg/dL — ABNORMAL HIGH (ref 65–99)
Potassium: 4.2 mmol/L (ref 3.5–5.1)
Sodium: 132 mmol/L — ABNORMAL LOW (ref 135–145)

## 2017-08-14 LAB — I-STAT TROPONIN, ED: Troponin i, poc: 0 ng/mL (ref 0.00–0.08)

## 2017-08-14 NOTE — ED Notes (Signed)
Pt ambulated unassisted to the bathroom. Tolerated well.

## 2017-08-14 NOTE — ED Triage Notes (Signed)
Pt states that his L chest started to hurt about 30 minutes ago, afib RVR, hx of afib, SOB, diaphoretic, nauseated.

## 2017-08-14 NOTE — ED Provider Notes (Addendum)
[facility] EMERGENCY DEPARTMENT Provider Note   CSN: [id number] Arrival date & time: 08/14/17  2012     History   Chief Complaint Chief Complaint  Patient presents with  . Chest Pain  . Atrial Fibrillation    HPI Eric Palmer is a 61 y.o. male.  Patient with history of coronary artery disease, PCI 1996, paroxysmal atrial fibrillation compliant with presents with severe chest pain and palpitations. This is more significant and different than his normal atrial fibrillation. Patient is found to be in atrial fibrillation with rapid heart rate on arrival. Patient said he was diaphoretic and nauseous. Patient had a stress test that was inconclusive in January. Patient has cardiology follow-up outpatient in [place].currently no symptoms that resolved shortly after arrival.      Past Medical History:  Diagnosis Date  . Anal fissure   . Chronic bronchitis ([place])    "get it q spring and fall" (10/27/2013)  . Coronary artery disease    a. s/p PCI in 1996. b. LHC 01/2006 showed 50% mid LAD, otherwise widely patent cors with minimal irregularities. c. Nuclear stress test in 04/2009 was reportedly low risk, no ischemia.  . DM type 2, uncontrolled, with neuropathy ([place])   . ED (erectile dysfunction)   . Essential hypertension   . Hx of echocardiogram    Echo (10/2013): Mild LVH, moderate focal basal hypertrophy of the septum, EF 50-55%, no RWMA  . Hyperlipidemia   . Internal hemorrhoids without mention of complication   . Obesity   . Obesity   . OSA on CPAP   . Paresthesia of both legs   . Paroxysmal atrial fibrillation ([name])    a. failed Tikosyn, Multaq. b. s/p ablation of AF/AFL in 12/2013.  [name] Paroxysmal atrial flutter ([place])    a. s/p ablation of AF/AFL in 12/2013.  [name] Smoker    QUIT 10/13/13    Patient Active Problem List   Diagnosis Date Noted  . Diabetic neuropathy ([place]) 10/23/2015  . Tinnitus of both ears 10/23/2015  . CAD in native artery 06/26/2015    . Essential hypertension   . Paroxysmal atrial flutter ([place])   . Hyperlipidemia   . Typical atrial flutter ([place]) 03/26/2015  . Right carotid bruit 02/26/2015  . Chronic anticoagulation 02/25/2015  . Rectal bleeding 10/04/2014  . Paroxysmal atrial fibrillation ([place]) 10/27/2013  . Obesity (BMI 30-39.9) 07/10/2013  . COPD (chronic obstructive pulmonary disease) ([place]) 06/03/2012  . Type 2 diabetes mellitus ([place]) 06/03/2012  . Tobacco use disorder 06/03/2012  . Metabolic syndrome 06/20/2535  . Obstructive sleep apnea 11/08/2008  . ALLERGIC RHINITIS 11/08/2008  . Asthma 11/08/2008    Past Surgical History:  Procedure Laterality Date  . ABLATION  01-12-2014   PVI and CTI by Dr [name]  . ANAL FISSURE REPAIR  ~ 2010  . ATRIAL FIBRILLATION ABLATION N/A 01/12/2014   Procedure: ATRIAL FIBRILLATION ABLATION;  Surgeon: Coralyn Mark, MD;  Location: [facility];  Service: Cardiovascular;  Laterality: N/A;  . CARDIAC CATHETERIZATION  2007  . CORONARY ANGIOPLASTY WITH STENT PLACEMENT  1996   residual 50% LAD and RCA by cath in 2007  . CORONARY ANGIOPLASTY WITH STENT PLACEMENT  1995  . INGUINAL HERNIA REPAIR Right 1963  . TEE WITHOUT CARDIOVERSION N/A 01/11/2014   Procedure: TRANSESOPHAGEAL ECHOCARDIOGRAM (TEE);  Surgeon: [name], MD;  Location: [facility];  Service: Cardiovascular;  Laterality: N/A;  . [place] Medications  Prior to Admission medications   Medication Sig Start Date End Date Taking? Authorizing Provider  ADVAIR DISKUS 100-50 MCG/DOSE AEPB INHALE 1 PUFF INTO THE LUNGS TWICE DAILY 05/10/17  Yes Burchette, Alinda Sierras, MD  atorvastatin (LIPITOR) 40 MG tablet Take 1 tablet (40 mg total) by mouth daily at 6 PM. 03/01/17  Yes [name], MD  diltiazem (CARDIZEM CD) 360 MG 24 hr capsule Take 1 capsule (360 mg total) by mouth daily. 07/27/17  Yes [name], MD  fish oil-omega-3 fatty acids 1000 MG capsule Take 1 g by mouth 2  (two) times daily.    Yes [provider]  LYRICA 100 MG capsule TAKE 1 CAPSULE BY MOUTH EVERY MORNING, 1 CAPSULE AT NOON, AND 2 CAPSULES EVERY NIGHT AT BEDTIME Patient taking differently: TAKE 1 CAPSULE (100 MG) BY MOUTH EVERY MORNING, 1 CAPSULE AT NOON, AND 2 CAPSULES EVERY NIGHT AT BEDTIME 05/10/17  Yes Burchette, Alinda Sierras, MD  metFORMIN (GLUCOPHAGE-XR) 750 MG 24 hr tablet TAKE 1 TABLET BY MOUTH TWICE DAILY Patient taking differently: TAKE 1 TABLET (750 MG) BY MOUTH TWICE DAILY 05/04/17  Yes Burchette, Alinda Sierras, MD  Multiple Vitamins-Minerals (CENTRUM SILVER 50+MEN PO) Take 1 tablet by mouth daily.   Yes [provider]  nitroGLYCERIN (NITROSTAT) 0.4 MG SL tablet Place 0.4 mg under the tongue every 5 (five) minutes x 3 doses as needed for chest pain (Call 911 at 3rd dose within 15 minutes).   Yes [provider]  PROAIR HFA 108 (90 Base) MCG/ACT inhaler INHALE 2 PUFFS INTO THE LUNGS EVERY 2 HOURS AS NEEDED FOR WHEEZING OR SHORTNESS OF BREATH 07/26/17  Yes Burchette, Alinda Sierras, MD  sotalol (BETAPACE) 80 MG tablet TAKE 1 TABLET(80 MG) BY MOUTH TWICE DAILY 12/14/16  Yes [name], MD  XARELTO 20 MG TABS tablet TAKE 1 TABLET(20 MG) BY MOUTH DAILY WITH SUPPER 06/03/17  Yes [name], MD  [facility] DELICA LANCETS FINE MISC USE TO CHECK BLOOD SUGAR ONCE DAILY 03/19/17   Burchette, Alinda Sierras, MD  ONETOUCH VERIO test strip USE TO CHECK BLOOD SUGAR ONCE DAILY 08/09/17   Burchette, Alinda Sierras, MD    Family History Family History  Problem Relation Age of Onset  . Heart disease Mother 41       Arrhythmia  . Hypertension Mother   . Heart disease Father 11       died 60 CHF  . Heart attack Father   . Hyperlipidemia Other   . Heart disease Other   . Stroke Neg Hx     Social History Social History   Tobacco Use  . Smoking status: Current Every Day Smoker    Packs/day: 1.00    Years: 43.00    Pack years: 43.00    Types: Cigarettes  . Smokeless tobacco: Never Used    Substance Use Topics  . Alcohol use: Yes    Alcohol/week: 0.0 oz    Comment: 10/27/2013 "might have 1 beer/month"  . Drug use: No     Allergies   Cephalosporins; Penicillins; Codeine; Gabapentin; and Eliquis [apixaban]   Review of Systems Review of Systems  Constitutional: Positive for appetite change and diaphoresis. Negative for chills and fever.  HENT: Negative for congestion.   Eyes: Negative for visual disturbance.  Respiratory: Positive for shortness of breath.   Cardiovascular: Positive for chest pain and palpitations.  Gastrointestinal: Negative for abdominal pain and vomiting.  Genitourinary: Negative for dysuria and flank pain.  Musculoskeletal: Negative for back  pain, neck pain and neck stiffness.  Skin: Negative for rash.  Neurological: Negative for light-headedness and headaches.     Physical Exam Updated Vital Signs BP 110/68   Pulse 70   Resp 13   SpO2 96%   Physical Exam  Constitutional: He is oriented to person, place, and time. He appears well-developed and well-nourished.  HENT:  Head: Normocephalic and atraumatic.  Eyes: Conjunctivae are normal. Right eye exhibits no discharge. Left eye exhibits no discharge.  Neck: Normal range of motion. Neck supple. No tracheal deviation present.  Cardiovascular: Normal rate and regular rhythm.  Pulmonary/Chest: Effort normal and breath sounds normal.  Abdominal: Soft. He exhibits no distension. There is no tenderness. There is no guarding.  Musculoskeletal: He exhibits no edema.  Neurological: He is alert and oriented to person, place, and time.  Skin: Skin is warm. No rash noted.  Psychiatric: He has a normal mood and affect.  Nursing note and vitals reviewed.    ED Treatments / Results  Labs (all labs ordered are listed, but only abnormal results are displayed) Labs Reviewed  BASIC METABOLIC PANEL - Abnormal; Notable for the following components:      Result Value   Sodium 132 (*)    Chloride 98 (*)     CO2 21 (*)    Glucose, Bld 182 (*)    All other components within normal limits  CBC - Abnormal; Notable for the following components:   WBC 11.8 (*)    All other components within normal limits  I-STAT TROPONIN, ED  I-STAT TROPONIN, ED    EKG  EKG Interpretation  Date/Time:  Saturday August 14 2017 20:24:54 EST Ventricular Rate:  96 PR Interval:  136 QRS Duration: 88 QT Interval:  334 QTC Calculation: 421 R Axis:   74 Text Interpretation:  Normal sinus rhythm Cannot rule out Anterior infarct , age undetermined Abnormal ECG Confirmed by Elnora Morrison [phone]) on 08/14/2017 8:33:56 PM       Radiology Dg Chest Portable 1 View  Result Date: 08/14/2017 CLINICAL DATA:  Initial evaluation for acute left-sided chest pain. EXAM: PORTABLE CHEST 1 VIEW COMPARISON:  Prior radiograph from 01/13/2017. FINDINGS: The cardiac and mediastinal silhouettes are stable in size and contour, and remain within normal limits. Aortic atherosclerosis. The lungs are mildly hypoinflated. No airspace consolidation, pleural effusion, or pulmonary edema is identified. There is no pneumothorax. No acute osseous abnormality identified. IMPRESSION: No active cardiopulmonary disease. Electronically Signed   By: Jeannine Boga M.D.   On: 08/14/2017 22:40    Procedures Procedures (including critical care time)  Medications Ordered in ED Medications - No data to display   Initial Impression / Assessment and Plan / ED Course  I have reviewed the triage vital signs and the nursing notes.  Pertinent labs & imaging results that were available during my care of the patient were reviewed by me and considered in my medical decision making (see chart for details).    Patient presents after episode of atrial fibrillation with rapid ventricular response. Patient currently asymptomatic and well appearing. Concerning presentation is patient's never had severe chest pain like this with his atrial fibrillation.  Plan for observation and serial troponins. Reviewed last stress test. [facility] cardiology for official consult.  Discussed with cardiology for consult. And reassessment patient well-appearing sleeping comfortably without any chest pain. Delta troponin pending. Plan to follow up cardiology recommendations.  Cards fellow recommended obs overnight.   CHA2DS2/VAS Stroke Risk Points      3 >=  2 Points: High Risk  1 - 1.99 Points: Medium Risk  0 Points: Low Risk    This is the only CHA2DS2/VAS Stroke Risk Points available for the past  year.:  Change: N/A     Details    This score determines the patient's risk of having a stroke if the  patient has atrial fibrillation.       Points Metrics  0 Has Congestive Heart Failure:  No   1 Has Vascular Disease:  Yes   1 Has Hypertension:  Yes   0 Age:  3   1 Has Diabetes:  Yes   0 Had Stroke:  No  Had TIA:  No  Had thromboembolism:  No   0 Male:  No       Pt on NOAC.      Final Clinical Impressions(s) / ED Diagnoses   Final diagnoses:  Atrial fibrillation with RVR ([place])  Acute chest pain    ED Discharge Orders    None       Elnora Morrison, MD 08/14/17 [id number]    Elnora Morrison, MD 08/14/17 [id number]

## 2017-08-15 ENCOUNTER — Observation Stay (HOSPITAL_BASED_OUTPATIENT_CLINIC_OR_DEPARTMENT_OTHER): Payer: 59

## 2017-08-15 DIAGNOSIS — G4733 Obstructive sleep apnea (adult) (pediatric): Secondary | ICD-10-CM | POA: Diagnosis not present

## 2017-08-15 DIAGNOSIS — R079 Chest pain, unspecified: Secondary | ICD-10-CM | POA: Diagnosis not present

## 2017-08-15 DIAGNOSIS — J449 Chronic obstructive pulmonary disease, unspecified: Secondary | ICD-10-CM | POA: Diagnosis not present

## 2017-08-15 DIAGNOSIS — I480 Paroxysmal atrial fibrillation: Secondary | ICD-10-CM | POA: Diagnosis not present

## 2017-08-15 DIAGNOSIS — E669 Obesity, unspecified: Secondary | ICD-10-CM | POA: Diagnosis not present

## 2017-08-15 DIAGNOSIS — I4891 Unspecified atrial fibrillation: Secondary | ICD-10-CM | POA: Diagnosis present

## 2017-08-15 LAB — I-STAT TROPONIN, ED: Troponin i, poc: 0 ng/mL (ref 0.00–0.08)

## 2017-08-15 LAB — CBC
HCT: 42.6 % (ref 39.0–52.0)
Hemoglobin: 14.7 g/dL (ref 13.0–17.0)
MCH: 31 pg (ref 26.0–34.0)
MCHC: 34.5 g/dL (ref 30.0–36.0)
MCV: 89.9 fL (ref 78.0–100.0)
Platelets: 208 10*3/uL (ref 150–400)
RBC: 4.74 MIL/uL (ref 4.22–5.81)
RDW: 12.8 % (ref 11.5–15.5)
WBC: 10.4 10*3/uL (ref 4.0–10.5)

## 2017-08-15 LAB — BASIC METABOLIC PANEL
Anion gap: 7 (ref 5–15)
BUN: 15 mg/dL (ref 6–20)
CO2: 26 mmol/L (ref 22–32)
Calcium: 8.7 mg/dL — ABNORMAL LOW (ref 8.9–10.3)
Chloride: 103 mmol/L (ref 101–111)
Creatinine, Ser: 0.98 mg/dL (ref 0.61–1.24)
GFR calc Af Amer: >60 mL/min (ref >60)
GFR calc non Af Amer: >60 mL/min (ref >60)
Glucose, Bld: 118 mg/dL — ABNORMAL HIGH (ref 65–99)
Potassium: 4.4 mmol/L (ref 3.5–5.1)
Sodium: 136 mmol/L (ref 135–145)

## 2017-08-15 LAB — LIPID PANEL
Cholesterol: 98 mg/dL (ref 0–200)
HDL: 29 mg/dL — ABNORMAL LOW (ref >40)
LDL Cholesterol: 5 mg/dL (ref 0–99)
Total CHOL/HDL Ratio: 3.4 RATIO
Triglycerides: 318 mg/dL — ABNORMAL HIGH (ref <150)
VLDL: 64 mg/dL — ABNORMAL HIGH (ref 0–40)

## 2017-08-15 LAB — GLUCOSE, CAPILLARY: Glucose-Capillary: 139 mg/dL — ABNORMAL HIGH (ref 65–99)

## 2017-08-15 LAB — ECHOCARDIOGRAM COMPLETE
Height: 74 in
Weight: 3923.2 oz

## 2017-08-15 LAB — TROPONIN I
Troponin I: <0.03 ng/mL (ref <0.03)
Troponin I: <0.03 ng/mL (ref <0.03)

## 2017-08-15 LAB — TSH: TSH: 2.363 u[IU]/mL (ref 0.350–4.500)

## 2017-08-15 LAB — HEMOGLOBIN A1C
Hgb A1c MFr Bld: 7 % — ABNORMAL HIGH (ref 4.8–5.6)
Mean Plasma Glucose: 154.2 mg/dL

## 2017-08-15 LAB — HIV ANTIBODY (ROUTINE TESTING W REFLEX): HIV Screen 4th Generation wRfx: NONREACTIVE

## 2017-08-15 MED ORDER — PREGABALIN 100 MG PO CAPS
100.0000 mg | ORAL_CAPSULE | Freq: Every day | ORAL | Status: DC
  Administered 2017-08-15: 100 mg via ORAL
  Filled 2017-08-15: qty 1

## 2017-08-15 MED ORDER — NITROGLYCERIN 0.4 MG SL SUBL: 0.4000 mg | SUBLINGUAL_TABLET | SUBLINGUAL | Status: DC | PRN

## 2017-08-15 MED ORDER — DILTIAZEM HCL ER COATED BEADS 360 MG PO CP24: 360.0000 mg | ORAL_CAPSULE | Freq: Every day | ORAL | Status: DC

## 2017-08-15 MED ORDER — INSULIN ASPART 100 UNIT/ML ~~LOC~~ SOLN: 0.0000 [IU] | Freq: Three times a day (TID) | SUBCUTANEOUS | Status: DC

## 2017-08-15 MED ORDER — ACETAMINOPHEN 325 MG PO TABS: 650.0000 mg | ORAL_TABLET | ORAL | Status: DC | PRN

## 2017-08-15 MED ORDER — MOMETASONE FURO-FORMOTEROL FUM 100-5 MCG/ACT IN AERO
2.0000 | INHALATION_SPRAY | Freq: Two times a day (BID) | RESPIRATORY_TRACT | Status: DC
  Filled 2017-08-15 (×2): qty 8.8

## 2017-08-15 MED ORDER — INFLUENZA VAC SPLIT QUAD 0.5 ML IM SUSY: 0.5000 mL | PREFILLED_SYRINGE | INTRAMUSCULAR | Status: DC

## 2017-08-15 MED ORDER — ATORVASTATIN CALCIUM 40 MG PO TABS: 40.0000 mg | ORAL_TABLET | Freq: Every day | ORAL | Status: DC

## 2017-08-15 MED ORDER — RIVAROXABAN 20 MG PO TABS: 20.0000 mg | ORAL_TABLET | Freq: Every day | ORAL | Status: DC

## 2017-08-15 MED ORDER — DILTIAZEM HCL ER COATED BEADS 360 MG PO CP24
360.0000 mg | ORAL_CAPSULE | Freq: Every day | ORAL | Status: DC
  Administered 2017-08-15: 360 mg via ORAL
  Filled 2017-08-15: qty 2
  Filled 2017-08-15: qty 1

## 2017-08-15 MED ORDER — INSULIN ASPART 100 UNIT/ML ~~LOC~~ SOLN: 0.0000 [IU] | Freq: Every day | SUBCUTANEOUS | Status: DC

## 2017-08-15 MED ORDER — OMEGA-3-ACID ETHYL ESTERS 1 G PO CAPS
1.0000 g | ORAL_CAPSULE | Freq: Two times a day (BID) | ORAL | Status: DC
  Administered 2017-08-15: 1 g via ORAL
  Filled 2017-08-15: qty 1

## 2017-08-15 MED ORDER — SOTALOL HCL 80 MG PO TABS
80.0000 mg | ORAL_TABLET | Freq: Two times a day (BID) | ORAL | Status: DC
  Administered 2017-08-15 (×2): 80 mg via ORAL
  Filled 2017-08-15 (×2): qty 1

## 2017-08-15 NOTE — Progress Notes (Signed)
  Echocardiogram 2D Echocardiogram has been performed.  Johny Chess 08/15/2017, 10:01 AM

## 2017-08-15 NOTE — Discharge Summary (Signed)
Eric Palmer   Discharge Summary    Patient ID: Eric Palmer,  MRN: [id number], DOB/AGE: 60-Nov-1957 61 y.o.  Admit date: 08/14/2017 Discharge date: 08/15/2017  Primary Care Provider: [name] Primary Cardiologist: Dr. Tamala Julian    Discharge Diagnoses    Principal Problem:   Atrial fibrillation with RVR [facility]) Active Problems:   Obstructive sleep apnea   Asthma   Metabolic syndrome   COPD (chronic obstructive pulmonary disease) ([name])   Type 2 diabetes mellitus ([name])   Obesity (BMI 30-39.9)   Paroxysmal atrial fibrillation ([name])   Chronic anticoagulation   Essential hypertension   Paroxysmal atrial flutter ([name])   Hyperlipidemia   CAD in native artery   Diabetic neuropathy ([name])   Allergies Allergies  Allergen Reactions  . Cephalosporins Anaphylaxis  . Penicillins Anaphylaxis  . Codeine Itching  . Gabapentin Other (See Comments)    "made me feel drugged"  . Eliquis [Apixaban]     Diarrhea     History of Present Illness     Eric Palmer is a 61 y.o. male with a history of complicated afib/aflutter s/p ablation in 2015 and failed multaq, tikosyn for the afib (currently on sotalol), DMT2, HTN, HLP, CAD with RCA stent in 2005 and mLAD stent in 2007, Type 2 DM, OSA compliant with CPAP who presented to [facility] on 12/23 with chest pain.  Mr. Wile presented with left-sided chest pain (worst pain of his life) associated with shortness of breath, palpitations and elevated heart rates.  He presented to the ER and was found to be in A. fib with RVR with heart rates in the 150s.  He spontaneously converted to sinus rhythm with resolution of all of his prior symptoms.  It was felt that he was okay for discharge home but the patient was reluctant given the severity of his pain so he was admitted for overnight admission and delta troponin.   Hospital Course     Consultants: none  He is feeling very good this  morning.  He has had no recurrence of his chest pain.  Delta troponin has been negative.There was a discussion about proceeding with cath but the patient wanted to go home for the holidays and discuss as an outpatient.  He has been evaluated by Dr. Marlou Porch and felt to be stable for discharge home.  I will arrange for TOC follow-up in the next 1-2 weeks with an APP that works with Dr. Tamala Julian.   The patient has had an uncomplicated hospital course and is recovering well. He has been seen by Dr. Marlou Porch today and deemed ready for discharge home. Discharge medications are listed below.  _____________  Discharge Vitals Blood pressure 113/71, pulse 67, temperature 98.2 F (36.8 C), temperature source Oral, resp. rate 18, height 6\' 2"  (1.88 m), weight 245 lb 3.2 oz (111.2 kg), SpO2 96 %.  Filed Weights   08/15/17 0112  Weight: 245 lb 3.2 oz (111.2 kg)    Labs & Radiologic Studies     CBC Recent Labs    08/14/17 2018 08/15/17 0419  WBC 11.8* 10.4  HGB 16.4 14.7  HCT 46.8 42.6  MCV 89.7 89.9  PLT 264 277   Basic Metabolic Panel Recent Labs    08/14/17 2018 08/15/17 0419  NA 132* 136  K 4.2 4.4  CL 98* 103  CO2 21* 26  GLUCOSE 182* 118*  BUN 20 15  CREATININE 0.90 0.98  CALCIUM 9.6 8.7*  Liver Function Tests No results for input(s): AST, ALT, ALKPHOS, BILITOT, PROT, ALBUMIN in the last 72 hours. No results for input(s): LIPASE, AMYLASE in the last 72 hours. Cardiac Enzymes Recent Labs    08/15/17 0149 08/15/17 0419  TROPONINI <0.03 <0.03   BNP Invalid input(s): POCBNP D-Dimer No results for input(s): DDIMER in the last 72 hours. Hemoglobin A1C Recent Labs    08/15/17 0419  HGBA1C 7.0*   Fasting Lipid Panel Recent Labs    08/15/17 0419  CHOL 98  HDL 29*  LDLCALC 5  TRIG 318*  CHOLHDL 3.4   Thyroid Function Tests Recent Labs    08/15/17 0419  TSH 2.363    Dg Chest Portable 1 View  Result Date: 08/14/2017 CLINICAL DATA:  Initial evaluation for acute  left-sided chest pain. EXAM: PORTABLE CHEST 1 VIEW COMPARISON:  Prior radiograph from 01/13/2017. FINDINGS: The cardiac and mediastinal silhouettes are stable in size and contour, and remain within normal limits. Aortic atherosclerosis. The lungs are mildly hypoinflated. No airspace consolidation, pleural effusion, or pulmonary edema is identified. There is no pneumothorax. No acute osseous abnormality identified. IMPRESSION: No active cardiopulmonary disease. Electronically Signed   By: Jeannine Boga M.D.   On: 08/14/2017 22:40     Diagnostic Studies/Procedures    none _____________    Disposition   Pt is being discharged home today in good condition.  Follow-up Plans & Appointments    Follow-up Information    [name], MD Follow up.   Specialty:  Cardiology Why:  The office will contact you to arrange an appointment with Dr. Tamala Julian NP or PA Contact information: [address] [address] [phone]            Discharge Medications     Medication List    TAKE these medications   ADVAIR DISKUS 100-50 MCG/DOSE Aepb Generic drug:  Fluticasone-Salmeterol INHALE 1 PUFF INTO THE LUNGS TWICE DAILY   atorvastatin 40 MG tablet Commonly known as:  LIPITOR Take 1 tablet (40 mg total) by mouth daily at 6 PM.   CENTRUM SILVER 50+MEN PO Take 1 tablet by mouth daily.   diltiazem 360 MG 24 hr capsule Commonly known as:  CARDIZEM CD Take 1 capsule (360 mg total) by mouth daily.   fish oil-omega-3 fatty acids 1000 MG capsule Take 1 g by mouth 2 (two) times daily.   LYRICA 100 MG capsule Generic drug:  pregabalin TAKE 1 CAPSULE BY MOUTH EVERY MORNING, 1 CAPSULE AT NOON, AND 2 CAPSULES EVERY NIGHT AT BEDTIME What changed:  See the new instructions.   metFORMIN 750 MG 24 hr tablet Commonly known as:  GLUCOPHAGE-XR TAKE 1 TABLET BY MOUTH TWICE DAILY What changed:    how much to take  how to take this  when to take this     nitroGLYCERIN 0.4 MG SL tablet Commonly known as:  NITROSTAT Place 0.4 mg under the tongue every 5 (five) minutes x 3 doses as needed for chest pain (Call 911 at 3rd dose within 15 minutes).   ONETOUCH DELICA LANCETS FINE Misc USE TO CHECK BLOOD SUGAR ONCE DAILY   ONETOUCH VERIO test strip Generic drug:  glucose blood USE TO CHECK BLOOD SUGAR ONCE DAILY   PROAIR HFA 108 (90 Base) MCG/ACT inhaler Generic drug:  albuterol INHALE 2 PUFFS INTO THE LUNGS EVERY 2 HOURS AS NEEDED FOR WHEEZING OR SHORTNESS OF BREATH   sotalol 80 MG tablet Commonly known as:  BETAPACE TAKE 1 TABLET(80  MG) BY MOUTH TWICE DAILY   XARELTO 20 MG Tabs tablet Generic drug:  rivaroxaban TAKE 1 TABLET(20 MG) BY MOUTH DAILY WITH SUPPER         Outstanding Labs/Studies   none  Duration of Discharge Encounter   Greater than 30 minutes including physician time.  Signed, Angelena Form PA-C 08/15/2017, 9:47 AM   Personally seen and examined. Agree with above. Subjective   He is feeling very good this morning.  After his atrial fibrillation resolved, his chest pain resolved.  No shortness of breath.  Inpatient Medications    Scheduled Meds: . atorvastatin  40 mg Oral q1800  . diltiazem  360 mg Oral Daily  . [START ON 08/16/2017] Influenza vac split quadrivalent PF  0.5 mL Intramuscular Tomorrow-1000  . insulin aspart  0-15 Units Subcutaneous TID WC  . insulin aspart  0-5 Units Subcutaneous QHS  . mometasone-formoterol  2 puff Inhalation BID  . omega-3 acid ethyl esters  1 g Oral BID  . pregabalin  100 mg Oral Daily  . rivaroxaban  20 mg Oral Q supper  . sotalol  80 mg Oral Q12H   Continuous Infusions: PRN Meds: acetaminophen, nitroGLYCERIN   Vital Signs          Vitals:   08/15/17 0030 08/15/17 0112 08/15/17 0649 08/15/17 0839  BP: 102/81 119/86 111/80 113/71  Pulse: 71 75 71 67  Resp: 17 18 18 18   Temp:  97.9 F (36.6 C) 98 F (36.7 C) 98.2 F (36.8 C)  TempSrc:  Oral  Oral Oral  SpO2: 93% 95% 96% 96%  Weight:  245 lb 3.2 oz (111.2 kg)    Height:  6\' 2"  (1.88 m)      Intake/Output Summary (Last 24 hours) at 08/15/2017 0901 Last data filed at 08/15/2017 0700    Gross per 24 hour  Intake 240 ml  Output -  Net 240 ml      Filed Weights   08/15/17 0112  Weight: 245 lb 3.2 oz (111.2 kg)    Telemetry    Normal sinus rhythm- Personally Reviewed  ECG    Normal sinus rhythm heart rate 68.- Personally Reviewed  Physical Exam   GEN:No acute distress.   Neck:No JVD Cardiac:RRR, no murmurs, rubs, or gallops.  Respiratory:Clear to auscultation bilaterally. NW:GNFA, nontender, non-distended  MS:No edema; No deformity. Neuro:Nonfocal  Psych: Normal affect   Labs    Chemistry LastLabs      Recent Labs  Lab 08/14/17 2018 08/15/17 0419  NA 132* 136  K 4.2 4.4  CL 98* 103  CO2 21* 26  GLUCOSE 182* 118*  BUN 20 15  CREATININE 0.90 0.98  CALCIUM 9.6 8.7*  GFRNONAA >60 >60  GFRAA >60 >60  ANIONGAP 13 7       Hematology LastLabs      Recent Labs  Lab 08/14/17 2018 08/15/17 0419  WBC 11.8* 10.4  RBC 5.22 4.74  HGB 16.4 14.7  HCT 46.8 42.6  MCV 89.7 89.9  MCH 31.4 31.0  MCHC 35.0 34.5  RDW 13.0 12.8  PLT 264 208      Cardiac Enzymes LastLabs      Recent Labs  Lab 08/15/17 0149 08/15/17 0419  TROPONINI <0.03 <0.03      LastLabs      Recent Labs  Lab 08/14/17 2045 08/14/17 2357  TROPIPOC 0.00 0.00       BNP LastLabs  No results for input(s): BNP, PROBNP in the last 168 hours.  DDimer  Elie Confer  No results for input(s): DDIMER in the last 168 hours.     Radiology     ImagingResults(Last48hours)  Dg Chest Portable 1 View  Result Date: 08/14/2017 CLINICAL DATA:  Initial evaluation for acute left-sided chest pain. EXAM: PORTABLE CHEST 1 VIEW COMPARISON:  Prior radiograph from 01/13/2017. FINDINGS: The cardiac and mediastinal silhouettes are stable  in size and contour, and remain within normal limits. Aortic atherosclerosis. The lungs are mildly hypoinflated. No airspace consolidation, pleural effusion, or pulmonary edema is identified. There is no pneumothorax. No acute osseous abnormality identified. IMPRESSION: No active cardiopulmonary disease. Electronically Signed   By: Jeannine Boga M.D.   On: 08/14/2017 22:40     Cardiac Studies   Nuclear stress test January 2018-no significant ischemia.  Inferior wall attenuation artifact noted however.  Patient Profile     61 y.o. male with paroxysmal atrial fibrillation, prior atrial fibrillation ablation failed Multaq and Tikosyn currently on sotalol here with anginal symptoms following episode of A. fib RVR.  Spontaneously converted.  Assessment & Plan    Angina -No evidence of troponin elevation.  Certainly he could have progression of his underlying coronary artery disease.  We discussed the potential for cardiac catheterization and currently he would like to go home for the holidays and discuss this further with Dr. Zigmund Daniel.  It would not be unreasonable to have him electively set up for right heart catheterization with Dr. Tamala Julian, holding Xarelto for 2 days prior.  Currently reassuring troponin.  No evidence of myocardial injury.  Paroxysmal atrial fibrillation -Post ablation, failed Multaq, failed Tikosyn.  Currently on sotalol.  Converted.  Stable.  CAD with angina -As described above.Prior RCA stent placed in October 2005 and mid LAD stent placed in 2007.  OSA -Compliant with CPAP  Type 2 diabetes with hypertension - Per primary physician.  Okay with discharge home and close follow-up in the next 1-2 weeks with Dr. Tamala Julian or APP.  For questions or updates, please contact [place] Please consult [email or web address] for contact info under Cardiology/STEMI.      Signed, Candee Furbish, MD

## 2017-08-15 NOTE — H&P (Addendum)
CARDIOLOGY H&P  HPI:  Patient is a 61 y/o M who follow with Dr. Tamala Julian (Primary cardiologist) comes in today with L sided chest pain (worst pain of his life) associated with tightening, SOB, palpitations with increased HR's that started around 8 o clock this evening. Pain did not radiate and was associated with some diaphoresis. Symptoms were persistent until he arrived to the ER. On arrival, he was found to be in afib with RVR with rates in the 150's. He spontaneously converted to sinus rhythm with HR's in the 90's and had resolution of all his prior symptoms. On review of records, it appears that patient has these episodes with occurrences of his afib with RVR bursts which occur on rare occasion. Patient notes that this is worst pain he felt and isn't certain this all could be related to his afib. As noted, he no longer denies of pain and pain resolve after conversion of the rhythm from afib to sinus rhythm. He denies syncope, pre-syncope.   He remains compliant with all of his medications and take sotalol and xarelto for his afib diligently.  He has a pmhx of complicated afib/aflutter s/p ablation in 2015 and failed multaq, tikosyn for the afib, HTN, HLP, CAD with RCA stent in 1005 and mLAD stent in 2007, Type 2 DM, OSA compliant with CPAP.  Review of Systems:     Cardiac Review of Systems: {x ] = yes [ ]  = no  Chest Pain [  x  ]  Resting SOB [ x  ] Exertional SOB  [ x ]  Orthopnea [  ]   Pedal Edema [   ]    Palpitations [ x ] Syncope  [  ]   Presyncope [   ]  General Review of Systems: [Y] = yes [  ]=no Constitional: recent weight change [  ]; anorexia [  ]; fatigue [x ]; nausea [  ]; night sweats [  ]; fever [  ]; or chills [  ];                                                                     Dental: poor dentition[  ];   Eye : blurred vision [  ]; diplopia [   ]; vision changes [  ];  Amaurosis fugax[  ]; Resp: cough [  ];  wheezing[  ];  hemoptysis[  ]; shortness of breath[ x ];  paroxysmal nocturnal dyspnea[  ]; dyspnea on exertion[  ]; or orthopnea[  ];  GI:  gallstones[  ], vomiting[  ];  dysphagia[  ]; melena[  ];  hematochezia [  ]; heartburn[  ];   GU: kidney stones [  ]; hematuria[  ];   dysuria [  ];  nocturia[  ];               Skin: rash [  ], swelling[  ];, hair loss[  ];  peripheral edema[  ];  or itching[  ]; Musculosketetal: myalgias[  ];  joint swelling[  ];  joint erythema[  ];  joint pain[  ];  back pain[  ];  Heme/Lymph: bruising[  ];  bleeding[  ];  anemia[  ];  Neuro: TIA[  ];  headaches[  ];  stroke[  ];  vertigo[  ];  seizures[  ];   paresthesias[  ];  difficulty walking[  ];  Psych:depression[  ]; anxiety[  ];  Endocrine: diabetes[  ];  thyroid dysfunction[  ];  Other:  Past Medical History:  Diagnosis Date  . Anal fissure   . Chronic bronchitis ([place])    "get it q spring and fall" (10/27/2013)  . Coronary artery disease    a. s/p PCI in 1996. b. LHC 01/2006 showed 50% mid LAD, otherwise widely patent cors with minimal irregularities. c. Nuclear stress test in 04/2009 was reportedly low risk, no ischemia.  . DM type 2, uncontrolled, with neuropathy ([place])   . ED (erectile dysfunction)   . Essential hypertension   . Hx of echocardiogram    Echo (10/2013): Mild LVH, moderate focal basal hypertrophy of the septum, EF 50-55%, no RWMA  . Hyperlipidemia   . Internal hemorrhoids without mention of complication   . Obesity   . Obesity   . OSA on CPAP   . Paresthesia of both legs   . Paroxysmal atrial fibrillation ([name])    a. failed Tikosyn, Multaq. b. s/p ablation of AF/AFL in 12/2013.  [name] Paroxysmal atrial flutter ([place])    a. s/p ablation of AF/AFL in 12/2013.  [name] Smoker    QUIT 10/13/13    Allergies  Allergen Reactions  . Cephalosporins Anaphylaxis  . Penicillins Anaphylaxis  . Codeine Itching  . Gabapentin Other (See Comments)    "made me feel drugged"  . Eliquis [Apixaban]     Diarrhea   Social History   Socioeconomic History  .  Marital status: Married    Spouse name: Not on file  . Number of children: Not on file  . Years of education: Not on file  . Highest education level: Not on file  Social Needs  . Financial resource strain: Not on file  . Food insecurity - worry: Not on file  . Food insecurity - inability: Not on file  . Transportation needs - medical: Not on file  . Transportation needs - non-medical: Not on file  Occupational History  . Not on file  Tobacco Use  . Smoking status: Current Every Day Smoker    Packs/day: 1.00    Years: 43.00    Pack years: 43.00    Types: Cigarettes  . Smokeless tobacco: Never Used  Substance and Sexual Activity  . Alcohol use: Yes    Alcohol/week: 0.0 oz    Comment: 10/27/2013 "might have 1 beer/month"  . Drug use: No  . Sexual activity: Yes  Other Topics Concern  . Not on file  Social History Narrative   Works as an Programme researcher, broadcasting/film/video for [employer] History  Problem Relation Age of Onset  . Heart disease Mother 6       Arrhythmia  . Hypertension Mother   . Heart disease Father 61       died 77 CHF  . Heart attack Father   . Hyperlipidemia Other   . Heart disease Other   . Stroke Neg Hx     PHYSICAL EXAM: Vitals:   08/15/17 0030 08/15/17 0112  BP: 102/81 119/86  Pulse: 71 75  Resp: 17 18  Temp:  97.9 F (36.6 C)  SpO2: 93% 95%   General:  Well appearing. No respiratory difficulty HEENT: normal Neck: supple. no JVD. Carotids 2+ bilat; no bruits. No lymphadenopathy or thryomegaly appreciated. Cor: PMI nondisplaced. Regular rate & rhythm. No rubs, gallops  or murmurs. Lungs: clear Abdomen: soft, nontender, nondistended. No hepatosplenomegaly. No bruits or masses. Good bowel sounds. Extremities: no cyanosis, clubbing, rash, edema Neuro: alert & oriented x 3, cranial nerves grossly intact. moves all 4 extremities w/o difficulty. Affect pleasant.  ECG:  Results for orders placed or performed during the hospital encounter of 08/14/17 (from the  past 24 hour(s))  Basic metabolic panel     Status: Abnormal   Collection Time: 08/14/17  8:18 PM  Result Value Ref Range   Sodium 132 (L) 135 - 145 mmol/L   Potassium 4.2 3.5 - 5.1 mmol/L   Chloride 98 (L) 101 - 111 mmol/L   CO2 21 (L) 22 - 32 mmol/L   Glucose, Bld 182 (H) 65 - 99 mg/dL   BUN 20 6 - 20 mg/dL   Creatinine, Ser 0.90 0.61 - 1.24 mg/dL   Calcium 9.6 8.9 - 10.3 mg/dL   GFR calc non Af Amer >60 >60 mL/min   GFR calc Af Amer >60 >60 mL/min   Anion gap 13 5 - 15  CBC     Status: Abnormal   Collection Time: 08/14/17  8:18 PM  Result Value Ref Range   WBC 11.8 (H) 4.0 - 10.5 K/uL   RBC 5.22 4.22 - 5.81 MIL/uL   Hemoglobin 16.4 13.0 - 17.0 g/dL   HCT 46.8 39.0 - 52.0 %   MCV 89.7 78.0 - 100.0 fL   MCH 31.4 26.0 - 34.0 pg   MCHC 35.0 30.0 - 36.0 g/dL   RDW 13.0 11.5 - 15.5 %   Platelets 264 150 - 400 K/uL  I-Stat Troponin, ED (not at [facility])     Status: None   Collection Time: 08/14/17  8:45 PM  Result Value Ref Range   Troponin i, poc 0.00 0.00 - 0.08 ng/mL   Comment 3          I-stat troponin, ED     Status: None   Collection Time: 08/14/17 11:57 PM  Result Value Ref Range   Troponin i, poc 0.00 0.00 - 0.08 ng/mL   Comment 3           Dg Chest Portable 1 View  Result Date: 08/14/2017 CLINICAL DATA:  Initial evaluation for acute left-sided chest pain. EXAM: PORTABLE CHEST 1 VIEW COMPARISON:  Prior radiograph from 01/13/2017. FINDINGS: The cardiac and mediastinal silhouettes are stable in size and contour, and remain within normal limits. Aortic atherosclerosis. The lungs are mildly hypoinflated. No airspace consolidation, pleural effusion, or pulmonary edema is identified. There is no pneumothorax. No acute osseous abnormality identified. IMPRESSION: No active cardiopulmonary disease. Electronically Signed   By: Jeannine Boga M.D.   On: 08/14/2017 22:40    ASSESSMENT:  Paroxsymal Afib with RVR with narrow complex irregular tachycardia with HR's in the 150's  (failed maltaw, tikosyn) currently on sotalol 80mg , BID. Hx of AF/Aflutter ablation in 2015 OSA on CPAP HTN, HLP, Type 2 DM Hx of CAD s/p RCA stent in 1995 and mLAD in 2007  PLAN/DISCUSSION:  Initial EKG showed afib with RVR with minimal ST depressions in the lateral leads, but repeat EKG at 8:24pm showed recovered and upright T wave in the lateral leads and no other ST-T changes concerning for ischemia.   Echo will be performed in the AM along with troponin trend to r/o myocardial injury.   Currently free of chest pain. Chest pain resolved shortly after patient converted to sinus rhythm, but he would like to stay overnight to be reassured  as the discomfort in his chest was worse compared to his usual afib spells.   Patient also had a negative lexiscan in January of 2018 which was a low risk study.  We will continue his home medications. QT not long < 532ms. Will also continue the sotalol at 80mg , BID.  Ok to continue the xarelto for anticoagulation, has not missed any doses.   Patient has rare afib with rvr spells that present with similar symptoms and resolve within minutes.  Hopeful to be d/c tomorrow with a normal echo and normal troponin's.   He can f/u with Dr. Tamala Julian there after.  Willeen Cass Cardiology fellow

## 2017-08-15 NOTE — Progress Notes (Signed)
Progress Note  Patient Name: Eric Palmer Date of Encounter: 08/15/2017  Primary Cardiologist: No primary care provider on file.  Dr. Tamala Julian Subjective   He is feeling very good this morning.  After his atrial fibrillation resolved, his chest pain resolved.  No shortness of breath.  Inpatient Medications    Scheduled Meds: . atorvastatin  40 mg Oral q1800  . diltiazem  360 mg Oral Daily  . [START ON 08/16/2017] Influenza vac split quadrivalent PF  0.5 mL Intramuscular Tomorrow-1000  . insulin aspart  0-15 Units Subcutaneous TID WC  . insulin aspart  0-5 Units Subcutaneous QHS  . mometasone-formoterol  2 puff Inhalation BID  . omega-3 acid ethyl esters  1 g Oral BID  . pregabalin  100 mg Oral Daily  . rivaroxaban  20 mg Oral Q supper  . sotalol  80 mg Oral Q12H   Continuous Infusions:  PRN Meds: acetaminophen, nitroGLYCERIN   Vital Signs    Vitals:   08/15/17 0030 08/15/17 0112 08/15/17 0649 08/15/17 0839  BP: 102/81 119/86 111/80 113/71  Pulse: 71 75 71 67  Resp: 17 18 18 18   Temp:  97.9 F (36.6 C) 98 F (36.7 C) 98.2 F (36.8 C)  TempSrc:  Oral Oral Oral  SpO2: 93% 95% 96% 96%  Weight:  245 lb 3.2 oz (111.2 kg)    Height:  6\' 2"  (1.88 m)      Intake/Output Summary (Last 24 hours) at 08/15/2017 0901 Last data filed at 08/15/2017 0700 Gross per 24 hour  Intake 240 ml  Output -  Net 240 ml   Filed Weights   08/15/17 0112  Weight: 245 lb 3.2 oz (111.2 kg)    Telemetry    Normal sinus rhythm- Personally Reviewed  ECG    Normal sinus rhythm heart rate 68.- Personally Reviewed  Physical Exam   GEN: No acute distress.   Neck: No JVD Cardiac: RRR, no murmurs, rubs, or gallops.  Respiratory: Clear to auscultation bilaterally. GI: Soft, nontender, non-distended  MS: No edema; No deformity. Neuro:  Nonfocal  Psych: Normal affect   Labs    Chemistry Recent Labs  Lab 08/14/17 2018 08/15/17 0419  NA 132* 136  K 4.2 4.4  CL 98* 103  CO2  21* 26  GLUCOSE 182* 118*  BUN 20 15  CREATININE 0.90 0.98  CALCIUM 9.6 8.7*  GFRNONAA >60 >60  GFRAA >60 >60  ANIONGAP 13 7     Hematology Recent Labs  Lab 08/14/17 2018 08/15/17 0419  WBC 11.8* 10.4  RBC 5.22 4.74  HGB 16.4 14.7  HCT 46.8 42.6  MCV 89.7 89.9  MCH 31.4 31.0  MCHC 35.0 34.5  RDW 13.0 12.8  PLT 264 208    Cardiac Enzymes Recent Labs  Lab 08/15/17 0149 08/15/17 0419  TROPONINI <0.03 <0.03    Recent Labs  Lab 08/14/17 2045 08/14/17 2357  TROPIPOC 0.00 0.00     BNPNo results for input(s): BNP, PROBNP in the last 168 hours.   DDimer No results for input(s): DDIMER in the last 168 hours.   Radiology    Dg Chest Portable 1 View  Result Date: 08/14/2017 CLINICAL DATA:  Initial evaluation for acute left-sided chest pain. EXAM: PORTABLE CHEST 1 VIEW COMPARISON:  Prior radiograph from 01/13/2017. FINDINGS: The cardiac and mediastinal silhouettes are stable in size and contour, and remain within normal limits. Aortic atherosclerosis. The lungs are mildly hypoinflated. No airspace consolidation, pleural effusion, or pulmonary edema is identified. There  is no pneumothorax. No acute osseous abnormality identified. IMPRESSION: No active cardiopulmonary disease. Electronically Signed   By: Jeannine Boga M.D.   On: 08/14/2017 22:40    Cardiac Studies   Nuclear stress test January 2018-no significant ischemia.  Inferior wall attenuation artifact noted however.  Patient Profile     61 y.o. male with paroxysmal atrial fibrillation, prior atrial fibrillation ablation failed Multaq and Tikosyn currently on sotalol here with anginal symptoms following episode of A. fib RVR.  Spontaneously converted.  Assessment & Plan    Angina -No evidence of troponin elevation.  Certainly he could have progression of his underlying coronary artery disease.  We discussed the potential for cardiac catheterization and currently he would like to go home for the holidays  and discuss this further with Dr. Zigmund Daniel.  It would not be unreasonable to have him electively set up for right heart catheterization with Dr. Tamala Julian, holding Xarelto for 2 days prior.  Currently reassuring troponin.  No evidence of myocardial injury.  Paroxysmal atrial fibrillation -Post ablation, failed Multaq, failed Tikosyn.  Currently on sotalol.  Converted.  Stable.  CAD with angina -As described above.Prior RCA stent placed in October 2005 and mid LAD stent placed in 2007.  OSA -Compliant with CPAP  Type 2 diabetes with hypertension - Per primary physician.  Okay with discharge home and close follow-up in the next 1-2 weeks with Dr. Tamala Julian or APP.  For questions or updates, please contact [facility] Please consult [email or web address] for contact info under Cardiology/STEMI.      Signed, Candee Furbish, MD  08/15/2017, 9:01 AM

## 2017-08-15 NOTE — Progress Notes (Signed)
Pt IV removed, catheter intact and telemetry removed. Pt discharge teaching provided at bedside. Pt has all belongings. Pt refused wheelchair. Pt ambulated with RN to [employer]	[place] to meet transportation

## 2017-08-15 NOTE — Progress Notes (Signed)
Patient arrived from [facility]. A&O x 4. No complaints of chest pain. SR on telemetry. No skin breakdown. Low fall risk. Patient oriented to room and call system.

## 2017-08-18 ENCOUNTER — Telehealth: Payer: Self-pay | Admitting: Interventional Cardiology

## 2017-08-18 NOTE — Telephone Encounter (Signed)
New Message      Pt of [facility] - only wanted afternoon appt   [name] 09/03/16 345p (was only thing open at time)

## 2017-08-19 NOTE — Telephone Encounter (Signed)
Patient contacted on  08/19/2017 @ 1:22pm Patient understands to follow up with  Provider on 09/03/2017 @ 3:45pm with Eric Palmer Patient understands discharge instructions? yes Patient understands medications and regimen? yes Patient understands to bring all medications to this visit? Yes  Patient states he feels fine. He appreciates call

## 2017-09-03 ENCOUNTER — Ambulatory Visit: Payer: 59 | Admitting: Physician Assistant

## 2017-09-07 DIAGNOSIS — G4733 Obstructive sleep apnea (adult) (pediatric): Secondary | ICD-10-CM | POA: Diagnosis not present

## 2017-09-15 ENCOUNTER — Ambulatory Visit: Payer: 59 | Admitting: Physician Assistant

## 2017-09-15 ENCOUNTER — Encounter: Payer: Self-pay | Admitting: Physician Assistant

## 2017-09-15 VITALS — Ht 74.0 in | Wt 248.8 lb

## 2017-09-15 DIAGNOSIS — I2510 Atherosclerotic heart disease of native coronary artery without angina pectoris: Secondary | ICD-10-CM

## 2017-09-15 DIAGNOSIS — I480 Paroxysmal atrial fibrillation: Secondary | ICD-10-CM

## 2017-09-15 DIAGNOSIS — R0789 Other chest pain: Secondary | ICD-10-CM | POA: Diagnosis not present

## 2017-09-15 NOTE — Progress Notes (Signed)
Cardiology Office Note:    Date:  09/15/2017   ID:  Eric Palmer, DOB 1956/05/24, MRN [id number]  PCP:  Eric Post, MD  Cardiologist:  Eric Grooms, MD  Electrophysiologist:  Eric Palmer      Referring MD: Eric Post, MD   Chief Complaint  Patient presents with  . Hospitalization Follow-up    Admitted in December with chest pain, atrial fibrillation    History of Present Illness:    Eric Palmer is a 62 y.o. male with a hx of coronary artery disease status Palmer remote stenting of the RCA in 1996 in [place], [place], diabetes mellitus, hypertension, paroxysmal atrial fibrillation/flutter controlled on sotalol therapy (prior ablation, failed Multaq and Tikosyn), hyperlipidemia, sleep apnea.  Last seen by Dr. Tamala Palmer April 2018.  He was admitted 12/22-12/23 with severe chest pain in the setting of atrial fibrillation with RVR.  He spontaneously converted to normal sinus rhythm.  Cardiac enzymes remain negative.  Eric Palmer returns for follow-up.  He is here alone.  He tells me that he has been under a significant amount of stress.  His youngest son is struggling with addiction and has been in and out of rehabilitation.  He typically has recurrent atrial fibrillation in times of increased stress as well as fatigue.  On the night he went to the emergency room, his symptoms with atrial fibrillation were somewhat different and of longer duration.  He had associated chest pain and shortness of breath which was also unusual.  His symptoms went on for 30-45 minutes.  He has not had recurrent chest discomfort since that time.  He denies any recurrent shortness of breath.  He denies syncope, PND or edema.  He sleeps with CPAP.  He denies bleeding issues.  Prior CV studies:   The following studies were reviewed today:  Echocardiogram 08/15/17 EF 55-60, normal wall motion  Nuclear stress test 08/28/16 EF 55, no ischemia, small inferior infarct versus  artifact  Cardiac catheterization 01/2006  LAD mid 79, mid D1 40 LCx patent RCA patent  Past Medical History:  Diagnosis Date  . Anal fissure   . Chronic bronchitis ([place])    "get it q spring and fall" (10/27/2013)  . Coronary artery disease    a. s/p PCI in 1996. b. LHC 01/2006 showed 50% mid LAD, otherwise widely patent cors with minimal irregularities. c. Nuclear stress test in 04/2009 was reportedly low risk, no ischemia.  . DM type 2, uncontrolled, with neuropathy ([place])   . ED (erectile dysfunction)   . Essential hypertension   . Hx of echocardiogram    Echo (10/2013): Mild LVH, moderate focal basal hypertrophy of the septum, EF 50-55%, no RWMA  . Hyperlipidemia   . Internal hemorrhoids without mention of complication   . Obesity   . Obesity   . OSA on CPAP   . Paresthesia of both legs   . Paroxysmal atrial fibrillation ([name])    a. failed Tikosyn, Multaq. b. s/p ablation of AF/AFL in 12/2013.  [name] Paroxysmal atrial flutter ([place])    a. s/p ablation of AF/AFL in 12/2013.  [name] Smoker    QUIT 10/13/13    Past Surgical History:  Procedure Laterality Date  . ABLATION  01-12-2014   PVI and CTI by Dr Eric Palmer  . ANAL FISSURE REPAIR  ~ 2010  . ATRIAL FIBRILLATION ABLATION N/A 01/12/2014   Procedure: ATRIAL FIBRILLATION ABLATION;  Surgeon: Eric Mark, MD;  Location: [facility];  Service: Cardiovascular;  Laterality: N/A;  . CARDIAC CATHETERIZATION  2007  . CORONARY ANGIOPLASTY WITH STENT PLACEMENT  1996   residual 50% LAD and RCA by cath in 2007  . CORONARY ANGIOPLASTY WITH STENT PLACEMENT  1995  . INGUINAL HERNIA REPAIR Right 1963  . TEE WITHOUT CARDIOVERSION N/A 01/11/2014   Procedure: TRANSESOPHAGEAL ECHOCARDIOGRAM (TEE);  Surgeon: Eric Headings, MD;  Location: [facility];  Service: Cardiovascular;  Laterality: N/A;  . TONSILLECTOMY AND ADENOIDECTOMY  1979    Current Medications: Current Meds  Medication Sig  . ADVAIR DISKUS 100-50 MCG/DOSE AEPB INHALE 1 PUFF INTO THE LUNGS  TWICE DAILY  . atorvastatin (LIPITOR) 40 MG tablet Take 1 tablet (40 mg total) by mouth daily at 6 PM.  . diltiazem (CARDIZEM CD) 360 MG 24 hr capsule Take 1 capsule (360 mg total) by mouth daily.  . fish oil-omega-3 fatty acids 1000 MG capsule Take 1 g by mouth 2 (two) times daily.   [name] LYRICA 100 MG capsule TAKE 1 CAPSULE BY MOUTH EVERY MORNING, 1 CAPSULE AT NOON, AND 2 CAPSULES EVERY NIGHT AT BEDTIME  . metFORMIN (GLUCOPHAGE-XR) 750 MG 24 hr tablet TAKE 1 TABLET BY MOUTH TWICE DAILY  . Multiple Vitamins-Minerals (CENTRUM SILVER 50+MEN PO) Take 1 tablet by mouth daily.  . nitroGLYCERIN (NITROSTAT) 0.4 MG SL tablet Place 0.4 mg under the tongue every 5 (five) minutes x 3 doses as needed for chest pain (Call 911 at 3rd dose within 15 minutes).  Eric Palmer DELICA LANCETS FINE MISC USE TO CHECK BLOOD SUGAR ONCE DAILY  . ONETOUCH VERIO test strip USE TO CHECK BLOOD SUGAR ONCE DAILY  . PROAIR HFA 108 (90 Base) MCG/ACT inhaler INHALE 2 PUFFS INTO THE LUNGS EVERY 2 HOURS AS NEEDED FOR WHEEZING OR SHORTNESS OF BREATH  . sotalol (BETAPACE) 80 MG tablet TAKE 1 TABLET(80 MG) BY MOUTH TWICE DAILY  . XARELTO 20 MG TABS tablet TAKE 1 TABLET(20 MG) BY MOUTH DAILY WITH SUPPER     Allergies:   Cephalosporins; Penicillins; Codeine; Gabapentin; and Eliquis [apixaban]   Social History   Tobacco Use  . Smoking status: Current Every Day Smoker    Packs/day: 1.00    Years: 43.00    Pack years: 43.00    Types: Cigarettes  . Smokeless tobacco: Never Used  Substance Use Topics  . Alcohol use: Yes    Alcohol/week: 0.0 oz    Comment: 10/27/2013 "might have 1 beer/month"  . Drug use: No     Family Hx: The patient's family history includes Heart attack in his father; Heart disease in his other; Heart disease (age of onset: 63) in his father; Heart disease (age of onset: 7) in his mother; Hyperlipidemia in his other; Hypertension in his mother. There is no history of Stroke.  ROS:   Please see the history of  present illness.    Review of Systems  Cardiovascular: Positive for chest pain and irregular heartbeat.   All other systems reviewed and are negative.   EKGs/Labs/Other Test Reviewed:    EKG:  EKG is  ordered today.  The ekg ordered today demonstrates normal sinus rhythm, HR 79, normal axis, QTC 435, no significant change  Recent Labs: 04/09/2017: ALT 27 08/15/2017: BUN 15; Creatinine, Ser 0.98; Hemoglobin 14.7; Platelets 208; Potassium 4.4; Sodium 136; TSH 2.363   Recent Lipid Panel Lab Results  Component Value Date/Time   CHOL 98 08/15/2017 04:19 AM   TRIG 318 (H) 08/15/2017 04:19 AM   HDL 29 (L) 08/15/2017  04:19 AM   CHOLHDL 3.4 08/15/2017 04:19 AM   LDLCALC 5 08/15/2017 04:19 AM   LDLDIRECT 45.0 04/09/2017 11:00 AM    Physical Exam:    VS:  Ht 6\' 2"  (1.88 m)   Wt 248 lb 12.8 oz (112.9 kg)   SpO2 95%   BMI 31.94 kg/m     Wt Readings from Last 3 Encounters:  09/15/17 248 lb 12.8 oz (112.9 kg)  08/15/17 245 lb 3.2 oz (111.2 kg)  05/20/17 254 lb (115.2 kg)     Physical Exam  Constitutional: He is oriented to person, place, and time. He appears well-developed and well-nourished. No distress.  HENT:  Head: Normocephalic and atraumatic.  Eyes: No scleral icterus.  Neck: No JVD present.  Cardiovascular: Normal rate and regular rhythm.  No murmur heard. Pulmonary/Chest: He has no rales.  Abdominal: Soft.  Musculoskeletal: He exhibits no edema.  Neurological: He is alert and oriented to person, place, and time.  Skin: Skin is warm and dry.    ASSESSMENT:    1. Other chest pain   2. Paroxysmal atrial fibrillation ([name])   3. Coronary artery disease involving native coronary artery of native heart without angina pectoris    PLAN:    In order of problems listed above:  1.  Other chest pain  He had significant chest discomfort and shortness of breath for 30-45 minutes in the setting of atrial fibrillation with RVR.  Cardiac enzymes remained negative during his  hospitalization.  He has not had recurrent symptoms.  His ECG is unchanged.  I have recommended proceeding with follow-up exercise nuclear stress test to evaluate for high risk features and ischemia.  -Arrange ETT-Myoview  2.  Paroxysmal atrial fibrillation ([name])  Maintaining normal sinus rhythm.  He has undergone ablation in the past and has failed multiple antiarrhythmics.  He is currently maintained on sotalol.  His stress test will be conducted on both Cardizem and sotalol.  I have encouraged him to contact us if his symptoms of atrial fibrillation should increase in frequency.  At that point, I would recommend that he see Dr. Rayann Palmer for further evaluation and management.  3.  Coronary artery disease  History of remote history of stenting of the RCA in 1996.  Cardiac catheterization in 2007 with moderate nonobstructive CAD and patent RCA.  Nuclear stress test in June 2018 low risk.  As noted, exercise Myoview will be obtained to assess for ischemia given his recent admission to the hospital chest pain.  Dispo:  Return in about 3 months (around 12/14/2017) for Routine Follow Up with Dr. Tamala Palmer.   Medication Adjustments/Labs and Tests Ordered: Current medicines are reviewed at length with the patient today.  Concerns regarding medicines are outlined above.  Tests Ordered: Orders Placed This Encounter  Procedures  . Myocardial Perfusion Imaging  . EKG 12-Lead   Medication Changes: No orders of the defined types were placed in this encounter.   Signed, [name], PA-C  09/15/2017 4:59 PM    [facility] [address], [place], [place]  [address] Phone: [phone]; Fax: [phone]

## 2017-09-15 NOTE — Patient Instructions (Signed)
Medication Instructions:  1. Your physician recommends that you continue on your current medications as directed. Please refer to the Current Medication list given to you today.   Labwork: NONE ORDERED TODAY  Testing/Procedures: Your physician has requested that you have en exercise stress myoview. For further information please visit [email or web address]. Please follow instruction sheet, as given.    Follow-Up: DR. Tamala Julian 2-3 MONTHS   Any Other Special Instructions Will Be Listed Below (If Applicable).     If you need a refill on your cardiac medications before your next appointment, please call your pharmacy.

## 2017-09-21 ENCOUNTER — Telehealth (HOSPITAL_COMMUNITY): Payer: Self-pay | Admitting: *Deleted

## 2017-09-21 NOTE — Telephone Encounter (Signed)
Patient given detailed instructions per Myocardial Perfusion Study Information Sheet for the test on 09/24/17 at 0800. Patient notified to arrive 15 minutes early and that it is imperative to arrive on time for appointment to keep from having the test rescheduled.  If you need to cancel or reschedule your appointment, please call the office within 24 hours of your appointment. . Patient verbalized understanding.Eric Palmer, [name]

## 2017-09-24 ENCOUNTER — Ambulatory Visit (HOSPITAL_COMMUNITY): Payer: 59 | Attending: Internal Medicine

## 2017-09-24 ENCOUNTER — Encounter (HOSPITAL_COMMUNITY): Payer: 59

## 2017-09-24 DIAGNOSIS — I480 Paroxysmal atrial fibrillation: Secondary | ICD-10-CM

## 2017-09-24 DIAGNOSIS — I2510 Atherosclerotic heart disease of native coronary artery without angina pectoris: Secondary | ICD-10-CM | POA: Insufficient documentation

## 2017-09-24 DIAGNOSIS — R0789 Other chest pain: Secondary | ICD-10-CM | POA: Insufficient documentation

## 2017-09-24 LAB — MYOCARDIAL PERFUSION IMAGING
LV dias vol: 123 mL (ref 62–150)
LV sys vol: 55 mL
Peak HR: 104 {beats}/min
RATE: 0.34
Rest HR: 75 {beats}/min
SDS: 0
SRS: 7
SSS: 7
TID: 1.02

## 2017-09-24 MED ORDER — TECHNETIUM TC 99M TETROFOSMIN IV KIT
10.2000 | PACK | Freq: Once | INTRAVENOUS | Status: AC | PRN
  Administered 2017-09-24: 10.2 via INTRAVENOUS
  Filled 2017-09-24: qty 11

## 2017-09-24 MED ORDER — TECHNETIUM TC 99M TETROFOSMIN IV KIT
31.8000 | PACK | Freq: Once | INTRAVENOUS | Status: AC | PRN
  Administered 2017-09-24: 31.8 via INTRAVENOUS
  Filled 2017-09-24: qty 32

## 2017-09-24 MED ORDER — REGADENOSON 0.4 MG/5ML IV SOLN
0.4000 mg | Freq: Once | INTRAVENOUS | Status: AC
  Administered 2017-09-24: 0.4 mg via INTRAVENOUS

## 2017-09-26 ENCOUNTER — Encounter (HOSPITAL_COMMUNITY): Payer: Self-pay | Admitting: Emergency Medicine

## 2017-09-26 ENCOUNTER — Ambulatory Visit (HOSPITAL_COMMUNITY)
Admission: EM | Admit: 2017-09-26 | Discharge: 2017-09-26 | Disposition: A | Discharge status: Home / Self Care | Payer: 59 | Attending: Family Medicine | Admitting: Family Medicine

## 2017-09-26 DIAGNOSIS — T7840XA Allergy, unspecified, initial encounter: Secondary | ICD-10-CM

## 2017-09-26 DIAGNOSIS — L500 Allergic urticaria: Secondary | ICD-10-CM | POA: Diagnosis not present

## 2017-09-26 MED ORDER — PREDNISONE 20 MG PO TABS: ORAL_TABLET | ORAL | 0 refills | Status: DC

## 2017-09-26 NOTE — Discharge Instructions (Signed)
Nuclear Stress Findings   Isotope administration Rest isotope was administered with an IV injection of 10.2 mCi Tc66m Tetrofosmin. Rest SPECT images were obtained approximately 45 minutes post tracer injection. Stress isotope was administered with an IV injection of 31.8 mCi Tc30m Tetrofosmin 20 seconds post IV Lexiscan administration. Stress SPECT images were obtained approximately 60 minutes post tracer injection.  Nuclear Measurements Study was gated.  Rest Perfusion There is a defect present in the basal inferoseptal, basal inferior, basal inferolateral, mid inferior and apex location.  Stress Perfusion There is a defect present in the basal inferoseptal, basal inferior, basal inferolateral, mid anteroseptal, mid inferior, mid inferolateral, apical inferior and apex location.  Overall Study Impression Moderate defect in inferior, inferolateral, inferoseptal wall consistent with probable soft tissue attenuation (diaphragm), cannot exclude subendocardial scar No ischemia Nuclear stress EF: 55%.   From: ACCF/SCAI/STS/AATS/AHA/ASNC/HFSA/SCCT 2012 Appropriate Use Criteria for Coronary Revascularization Focused Update  Signed   Electronically signed by Fay Records, MD on 09/24/17 at 1719 EST  Report approved and finalized on 09/24/2017 1719

## 2017-09-26 NOTE — ED Provider Notes (Signed)
[facility]   [id number] 09/26/17 Arrival Time: 1203   SUBJECTIVE:  Eric Palmer is a 62 y.o. male who presents to the urgent care with complaint of hives, thought to be a reaction to chicken wings but did have Thallium stress study on Friday.  He had a thallium stress test in the past and did not get this reaction.  He is also eating chicken wings before and not had this reaction.  No shortness of breath or mouth swelling  He has been taking Benadryl.  Past Medical History:  Diagnosis Date  . Anal fissure   . Chronic bronchitis ([place])    "get it q spring and fall" (10/27/2013)  . Coronary artery disease    a. s/p PCI in 1996. b. LHC 01/2006 showed 50% mid LAD, otherwise widely patent cors with minimal irregularities. c. Nuclear stress test in 04/2009 was reportedly low risk, no ischemia.  . DM type 2, uncontrolled, with neuropathy ([place])   . ED (erectile dysfunction)   . Essential hypertension   . Hx of echocardiogram    Echo (10/2013): Mild LVH, moderate focal basal hypertrophy of the septum, EF 50-55%, no RWMA  . Hyperlipidemia   . Internal hemorrhoids without mention of complication   . Obesity   . Obesity   . OSA on CPAP   . Paresthesia of both legs   . Paroxysmal atrial fibrillation ([name])    a. failed Tikosyn, Multaq. b. s/p ablation of AF/AFL in 12/2013.  [name] Paroxysmal atrial flutter ([place])    a. s/p ablation of AF/AFL in 12/2013.  [name] Smoker    QUIT 10/13/13   Family History  Problem Relation Age of Onset  . Heart disease Mother 86       Arrhythmia  . Hypertension Mother   . Heart disease Father 64       died 48 CHF  . Heart attack Father   . Hyperlipidemia Other   . Heart disease Other   . Stroke Neg Hx    Social History   Socioeconomic History  . Marital status: Married    Spouse name: Not on file  . Number of children: Not on file  . Years of education: Not on file  . Highest education level: Not on file  Social Needs  . Financial resource  strain: Not on file  . Food insecurity - worry: Not on file  . Food insecurity - inability: Not on file  . Transportation needs - medical: Not on file  . Transportation needs - non-medical: Not on file  Occupational History  . Not on file  Tobacco Use  . Smoking status: Current Every Day Smoker    Packs/day: 1.00    Years: 43.00    Pack years: 43.00    Types: Cigarettes  . Smokeless tobacco: Never Used  Substance and Sexual Activity  . Alcohol use: Yes    Alcohol/week: 0.0 oz    Comment: 10/27/2013 "might have 1 beer/month"  . Drug use: No  . Sexual activity: Yes  Other Topics Concern  . Not on file  Social History Narrative   Works as an Programme researcher, broadcasting/film/video for [employer]  . ADVAIR DISKUS 100-50 MCG/DOSE AEPB INHALE 1 PUFF INTO THE LUNGS TWICE DAILY  . atorvastatin (LIPITOR) 40 MG tablet Take 1 tablet (40 mg total) by mouth daily at 6 PM.  . diltiazem (CARDIZEM CD) 360 MG 24 hr capsule Take 1 capsule (360 mg total) by mouth daily.  [name]  fish oil-omega-3 fatty acids 1000 MG capsule Take 1 g by mouth 2 (two) times daily.   [name] LYRICA 100 MG capsule TAKE 1 CAPSULE BY MOUTH EVERY MORNING, 1 CAPSULE AT NOON, AND 2 CAPSULES EVERY NIGHT AT BEDTIME  . metFORMIN (GLUCOPHAGE-XR) 750 MG 24 hr tablet TAKE 1 TABLET BY MOUTH TWICE DAILY  . Multiple Vitamins-Minerals (CENTRUM SILVER 50+MEN PO) Take 1 tablet by mouth daily.  [name] DELICA LANCETS FINE MISC USE TO CHECK BLOOD SUGAR ONCE DAILY  . ONETOUCH VERIO test strip USE TO CHECK BLOOD SUGAR ONCE DAILY  . PROAIR HFA 108 (90 Base) MCG/ACT inhaler INHALE 2 PUFFS INTO THE LUNGS EVERY 2 HOURS AS NEEDED FOR WHEEZING OR SHORTNESS OF BREATH  . sotalol (BETAPACE) 80 MG tablet TAKE 1 TABLET(80 MG) BY MOUTH TWICE DAILY  . XARELTO 20 MG TABS tablet TAKE 1 TABLET(20 MG) BY MOUTH DAILY WITH SUPPER   Allergies  Allergen Reactions  . Cephalosporins Anaphylaxis  . Penicillins Anaphylaxis  . Codeine Itching  . Gabapentin Other (See  Comments)    "made me feel drugged"  . Eliquis [Apixaban]     Diarrhea      ROS: As per HPI, remainder of ROS negative.   OBJECTIVE:   Vitals:   09/26/17 1214  BP: 117/63  Pulse: 69  Resp: 20  Temp: 98.3 F (36.8 C)  TempSrc: Oral  SpO2: 95%     General appearance: alert; no distress Eyes: PERRL; EOMI; conjunctiva normal HENT: normocephalic; atraumatic; ; oral mucosa normal Neck: supple Lungs: clear to auscultation bilaterally Heart: regular rate and rhythm Back: no CVA tenderness Extremities: no cyanosis or edema; symmetrical with no gross deformities Skin: warm and dry; mild urticarial type rash Neurologic: normal gait; grossly normal Psychological: alert and cooperative; normal mood and affect      Labs:  Results for orders placed or performed in visit on 09/24/17  Myocardial Perfusion Imaging  Result Value Ref Range   Rest HR 75 bpm   Rest BP 128/78 mmHg   Peak HR 104 bpm   Peak BP 156/69 mmHg   SSS 7    SRS 7    SDS 0    LHR 0.34    TID 1.02    LV sys vol 55 mL   LV dias vol 123 62 - 150 mL    Labs Reviewed - No data to display  No results found.     ASSESSMENT & PLAN:  1. Allergic reaction, initial encounter     Meds ordered this encounter  Medications  . predniSONE (DELTASONE) 20 MG tablet    Sig: Two daily with food    Dispense:  10 tablet    Refill:  0    Reviewed expectations re: course of current medical issues. Questions answered. Outlined signs and symptoms indicating need for more acute intervention. Patient verbalized understanding. After Visit Summary given.    Procedures:      Robyn Haber, MD 09/26/17 1232

## 2017-09-26 NOTE — ED Triage Notes (Signed)
PT C/O: allergic reaction to possible chicken wings.... reports hives all over body  Also reports thallium stress test on Friday   DENIES: SOB/dyspnea, dysphagia  TAKING MEDS: benadryl   A&O x4... NAD... Ambulatory

## 2017-09-29 ENCOUNTER — Encounter: Payer: Self-pay | Admitting: Physician Assistant

## 2017-09-29 ENCOUNTER — Telehealth: Payer: Self-pay | Admitting: *Deleted

## 2017-09-29 NOTE — Telephone Encounter (Signed)
-----   Message from Liliane Shi, [place] sent at 09/29/2017 11:01 AM EST ----- Please call the patient. The stress test does not show any ischemia (loss of blood flow from a blockage). There is a defect on the bottom part of the heart (inferior) that may be interference from the diaphragm or an old heart attack. This was seen on the last stress test a year ago.  The heart function (ejection fraction) is normal. I reviewed the stress test with [name], MD.  The findings are stable and do not suggest any change. Continue current medications and follow up as planned.  Please fax a copy of this study result to his PCP:  [name], MD  Thanks! [name], PA-C    09/29/2017 10:57 AM

## 2017-09-29 NOTE — Telephone Encounter (Signed)
Pt has been notified of Myoview results. Pt made aware of inferior defect that may be interference or an old heart attack. Pt aware this Myoview compared to stress test a yr ago. With Dr. Tamala Julian review as well. Pt aware findings are stable and do not suggest any change. Pt states he would like to see Dr. Tamala Julian sooner to discuss stress test results further. Pt has been scheduled to see Dr. Tamala Julian 10/01/17 @ 4 pm. Pt is already scheduled for 11/25/17 with Dr. Tamala Julian. I advised pt let's Dr. Tamala Julian decide if he will still need the appt on 11/25/17.

## 2017-10-01 ENCOUNTER — Encounter: Payer: Self-pay | Admitting: Interventional Cardiology

## 2017-10-01 ENCOUNTER — Ambulatory Visit: Payer: 59 | Admitting: Interventional Cardiology

## 2017-10-01 VITALS — BP 120/82 | HR 84 | Ht 74.0 in | Wt 251.0 lb

## 2017-10-01 DIAGNOSIS — I10 Essential (primary) hypertension: Secondary | ICD-10-CM

## 2017-10-01 DIAGNOSIS — I4891 Unspecified atrial fibrillation: Secondary | ICD-10-CM

## 2017-10-01 DIAGNOSIS — I2510 Atherosclerotic heart disease of native coronary artery without angina pectoris: Secondary | ICD-10-CM

## 2017-10-01 DIAGNOSIS — E1142 Type 2 diabetes mellitus with diabetic polyneuropathy: Secondary | ICD-10-CM | POA: Diagnosis not present

## 2017-10-01 DIAGNOSIS — F17200 Nicotine dependence, unspecified, uncomplicated: Secondary | ICD-10-CM

## 2017-10-01 NOTE — Progress Notes (Signed)
Cardiology Office Note    Date:  10/01/2017   ID:  Eric Palmer, DOB [birth year], MRN [id number]  PCP:  [name], MD  Cardiologist: [name], MD   Chief Complaint  Patient presents with  . Coronary Artery Disease  . Atrial Fibrillation    History of Present Illness:  Eric Palmer is a 62 y.o. male  with a history of coronary artery disease, remote right coronary stent in 1995, moderate mid LAD stenosis in 2007 by cath, and recent low risk myocardial perfusion study. Intercurrent diagnoses of diabetes mellitus, known history of hypertension, obesity, paroxysmal atrial fibrillation on sotalol therapy, and poorly controlled hyperlipidemia. Also history of obstructive sleep apnea.   The patient recently had an episode of atrial fibrillation that lasted greater than 45 minutes and led to an emergency room evaluation.  0 because he developed tightness in his chest which occurred several minutes into the episode of atrial fib.  He has had recurrent episodes of atrial fib in the past but never had chest tightness of this nature.  Because of the angina, he underwent a myocardial perfusion study recently which did not reveal significant difference compared with 2015 and 2018 studies.  All studies demonstrate both apical and inferior wall decreased uptake.  He is under a lot of stress at home, one son is opiate dependent.  Since being back in sinus rhythm, which occurred spontaneously in the emergency room, he has had no recurrent episodes of chest discomfort.  He has tried exercise on his treadmill but is limited by lower extremity fatigue and dyspnea.  He continues to smoke cigarettes has poor control of diabetes.   Past Medical History:  Diagnosis Date  . Anal fissure   . Chronic bronchitis ([place])    "get it q spring and fall" (10/27/2013)  . Coronary artery disease    a. s/p PCI in 1996. b. LHC 01/2006 showed 50% mid LAD, otherwise widely patent cors with minimal  irregularities. c. Nuclear stress test in 04/2009 was reportedly low risk, no ischemia. // d. Nuclear stress test 2/19: EF 55, transient ST depression in recovery, no ischemia, inf defect (artifact vs scar)   . DM type 2, uncontrolled, with neuropathy ([place])   . ED (erectile dysfunction)   . Essential hypertension   . Hx of echocardiogram    Echo (10/2013): Mild LVH, moderate focal basal hypertrophy of the septum, EF 50-55%, no RWMA  . Hyperlipidemia   . Internal hemorrhoids without mention of complication   . Obesity   . Obesity   . OSA on CPAP   . Paresthesia of both legs   . Paroxysmal atrial fibrillation ([name])    a. failed Tikosyn, Multaq. b. s/p ablation of AF/AFL in 12/2013.  [name] Paroxysmal atrial flutter ([place])    a. s/p ablation of AF/AFL in 12/2013.  [name] Smoker    QUIT 10/13/13    Past Surgical History:  Procedure Laterality Date  . ABLATION  01-12-2014   PVI and CTI by Dr [name]  . ANAL FISSURE REPAIR  ~ 2010  . ATRIAL FIBRILLATION ABLATION N/A 01/12/2014   Procedure: ATRIAL FIBRILLATION ABLATION;  Surgeon: Coralyn Mark, MD;  Location: [facility];  Service: Cardiovascular;  Laterality: N/A;  . CARDIAC CATHETERIZATION  2007  . CORONARY ANGIOPLASTY WITH STENT PLACEMENT  1996   residual 50% LAD and RCA by cath in 2007  . CORONARY ANGIOPLASTY WITH STENT PLACEMENT  1995  . INGUINAL HERNIA REPAIR Right  1963  . TEE WITHOUT CARDIOVERSION N/A 01/11/2014   Procedure: TRANSESOPHAGEAL ECHOCARDIOGRAM (TEE);  Surgeon: [name], MD;  Location: [facility];  Service: Cardiovascular;  Laterality: N/A;  . TONSILLECTOMY AND ADENOIDECTOMY  1979    Current Medications: Outpatient Medications Prior to Visit  Medication Sig Dispense Refill  . ADVAIR DISKUS 100-50 MCG/DOSE AEPB INHALE 1 PUFF INTO THE LUNGS TWICE DAILY 60 each 5  . atorvastatin (LIPITOR) 40 MG tablet Take 1 tablet (40 mg total) by mouth daily at 6 PM. 90 tablet 2  . diltiazem (CARDIZEM CD) 360 MG 24 hr capsule Take 1 capsule  (360 mg total) by mouth daily. 90 capsule 1  . fish oil-omega-3 fatty acids 1000 MG capsule Take 1 g by mouth 2 (two) times daily.     [name] LYRICA 100 MG capsule TAKE 1 CAPSULE BY MOUTH EVERY MORNING, 1 CAPSULE AT NOON, AND 2 CAPSULES EVERY NIGHT AT BEDTIME 360 capsule 5  . metFORMIN (GLUCOPHAGE-XR) 750 MG 24 hr tablet TAKE 1 TABLET BY MOUTH TWICE DAILY 180 tablet 1  . Multiple Vitamins-Minerals (CENTRUM SILVER 50+MEN PO) Take 1 tablet by mouth daily.    . nitroGLYCERIN (NITROSTAT) 0.4 MG SL tablet Place 0.4 mg under the tongue every 5 (five) minutes x 3 doses as needed for chest pain (Call 911 at 3rd dose within 15 minutes).    [name] DELICA LANCETS FINE MISC USE TO CHECK BLOOD SUGAR ONCE DAILY 100 each 0  . ONETOUCH VERIO test strip USE TO CHECK BLOOD SUGAR ONCE DAILY 100 each 0  . PROAIR HFA 108 (90 Base) MCG/ACT inhaler INHALE 2 PUFFS INTO THE LUNGS EVERY 2 HOURS AS NEEDED FOR WHEEZING OR SHORTNESS OF BREATH 8.5 g 0  . sotalol (BETAPACE) 80 MG tablet TAKE 1 TABLET(80 MG) BY MOUTH TWICE DAILY 180 tablet 2  . XARELTO 20 MG TABS tablet TAKE 1 TABLET(20 MG) BY MOUTH DAILY WITH SUPPER 90 tablet 1  . predniSONE (DELTASONE) 20 MG tablet Two daily with food (Patient not taking: Reported on 10/01/2017) 10 tablet 0   No facility-administered medications prior to visit.      Allergies:   Cephalosporins; Penicillins; Thallium; Codeine; Gabapentin; and Eliquis [apixaban]   Social History   Socioeconomic History  . Marital status: Married    Spouse name: None  . Number of children: None  . Years of education: None  . Highest education level: None  Social Needs  . Financial resource strain: None  . Food insecurity - worry: None  . Food insecurity - inability: None  . Transportation needs - medical: None  . Transportation needs - non-medical: None  Occupational History  . None  Tobacco Use  . Smoking status: Current Every Day Smoker    Packs/day: 1.00    Years: 43.00    Pack years: 43.00     Types: Cigarettes  . Smokeless tobacco: Never Used  Substance and Sexual Activity  . Alcohol use: Yes    Alcohol/week: 0.0 oz    Comment: 10/27/2013 "might have 1 beer/month"  . Drug use: No  . Sexual activity: Yes  Other Topics Concern  . None  Social History Narrative   Works as an Programme researcher, broadcasting/film/video for [employer] History:  The patient's family history includes Heart attack in his father; Heart disease in his other; Heart disease (age of onset: 31) in his father; Heart disease (age of onset: 39) in his mother; Hyperlipidemia in his other; Hypertension in his mother.  ROS:   Please see the history of present illness.    Stress, weight gain, concerned about the findings on his nuclear study although somewhat relieved to find that they were not greatly different than the prior studies in 2015 and 2018. All other systems reviewed and are negative.   PHYSICAL EXAM:   VS:  BP 120/82   Pulse 84   Ht 6\' 2"  (1.88 m)   Wt 251 lb (113.9 kg)   BMI 32.23 kg/m    GEN: Well nourished, well developed, in no acute distress.  Significant obesity. HEENT: normal  Neck: no JVD, carotid bruits, or masses Cardiac: RRR; no murmurs, rubs, or gallops,no edema  Respiratory:  clear to auscultation bilaterally, normal work of breathing GI: soft, nontender, nondistended, + BS MS: no deformity or atrophy  Skin: warm and dry, no rash Neuro:  Alert and Oriented x 3, Strength and sensation are intact Psych: euthymic mood, full affect  Wt Readings from Last 3 Encounters:  10/01/17 251 lb (113.9 kg)  09/15/17 248 lb 12.8 oz (112.9 kg)  08/15/17 245 lb 3.2 oz (111.2 kg)      Studies/Labs Reviewed:   EKG:  EKG the ECG tracing performed at 11:26 PM on 08/15/2017 reveals atrial flutter at a rate of 150 bpm.  This was the occurrence of chest tightness.  The next tracing performed on 09/15/2017 demonstrates normal sinus rhythm without acute ST-T wave change.  His last tracing was in office  tracing.  Recent Labs: 04/09/2017: ALT 27 08/15/2017: BUN 15; Creatinine, Ser 0.98; Hemoglobin 14.7; Platelets 208; Potassium 4.4; Sodium 136; TSH 2.363   Lipid Panel    Component Value Date/Time   CHOL 98 08/15/2017 0419   TRIG 318 (H) 08/15/2017 0419   HDL 29 (L) 08/15/2017 0419   CHOLHDL 3.4 08/15/2017 0419   VLDL 64 (H) 08/15/2017 0419   LDLCALC 5 08/15/2017 0419   LDLDIRECT 45.0 04/09/2017 1100    Additional studies/ records that were reviewed today include:  Myocardial perfusion imaging 09/24/2017: Study Highlights    Moderate defect in inferior, inferolateral, inferoseptal wall consistent with probable soft tissue attenuation (diaphragm), cannot exclude subendocardial scar No ischemia  Elctrically positive for ischemia though specificity limited (transient ST depression in recovery)  LVEF 55%  Overall low to intermediate risk scan.      ASSESSMENT:    1. Atrial fibrillation with RVR ([place])   2. Essential hypertension   3. CAD in native artery   4. Type 2 diabetes mellitus with diabetic polyneuropathy, without long-term current use of insulin (Eric Palmer)   5. Tobacco use disorder      PLAN:  In order of problems listed above:  1. Most recent episode of chest discomfort occurring in setting of atrial flutter 150 bpm.  Once arrhythmia resolved, chest discomfort resolved.  This is the first time he is ever had chest discomfort associated with arrhythmia.  It is possible that the atrial flutter was at a faster rate than when he develops atrial fibrillation.  This could explain the chest discomfort.  The nuclear study is abnormal but not significantly different than prior.  Under this situation and with poor risk factor control, I recommend that he undergo coronary angiography. 2. Blood pressures well controlled. 3. He has known coronary artery disease, chronically abnormal myocardial perfusion study, and while in atrial flutter with heart rate of 150 bpm developed significant  angina but ruled out for myocardial infarction.  Subsequent nuclear study is unchanged compared to one year ago  and 4 years ago.  By this, I feel repeat coronary angiography would be helpful to ensure that there is not progression in degree of coronary disease given his multitude of risk factors including type 2 diabetes which is poorly controlled and continued smoking. 4. Not addressed other than encouraged to cut back on carbohydrates and take medications as scribed. 5. Still smoking predominantly cigars but smells of cigarette smoke.   The patient was counseled to undergo left heart catheterization, coronary angiography, and possible percutaneous coronary intervention with stent implantation. The procedural risks and benefits were discussed in detail. The risks discussed included death, stroke, myocardial infarction, life-threatening bleeding, limb ischemia, kidney injury, allergy, and possible emergency cardiac surgery. The risk of these significant complications were estimated to occur less than 1% of the time. After discussion, the patient has agreed to proceed.    Medication Adjustments/Labs and Tests Ordered: Current medicines are reviewed at length with the patient today.  Concerns regarding medicines are outlined above.  Medication changes, Labs and Tests ordered today are listed in the Patient Instructions below. Patient Instructions  Medication Instructions:  Your physician recommends that you continue on your current medications as directed. Please refer to the Current Medication list given to you today.  Labwork: Your physician recommends that you return for lab work next week (BMET, CBC and INR)   Testing/Procedures: Your physician has requested that you have a cardiac catheterization. Cardiac catheterization is used to diagnose and/or treat various heart conditions. Doctors may recommend this procedure for a number of different reasons. The most common reason is to evaluate chest  pain. Chest pain can be a symptom of coronary artery disease (CAD), and cardiac catheterization can show whether plaque is narrowing or blocking your heart's arteries. This procedure is also used to evaluate the valves, as well as measure the blood flow and oxygen levels in different parts of your heart. For further information please visit HugeFiesta.tn. Please follow instruction sheet, as given.   Follow-Up: Your physician recommends that you schedule a follow-up appointment in: 2 weeks after heart cath on 2/18   Any Other Special Instructions Will Be Listed Below (If Applicable).    [facility] OFF[facility] [address], [place] [address] [place] [address] Dept: [phone] Loc: [phone]  ZAYLON BOSSIER  10/01/2017  You are scheduled for a Cardiac Catheterization on Monday, February 18 with Dr. Daneen Schick.  1. Please arrive at the [facility] (Main Entrance A) at [facility]: [address] [place], [place] [address] at 5:30 AM (two hours before your procedure to ensure your preparation). Free valet parking service is available.   Special note: Every effort is made to have your procedure done on time. Please understand that emergencies sometimes delay scheduled procedures.  2. Diet: Do not eat or drink anything after midnight prior to your procedure except sips of water to take medications.  3. Labs: You will need to have labs drawn here in the office next week.  4. Medication instructions in preparation for your procedure:  Stop taking Xarelto (Rivaroxaban) on Friday, February 15.  Do not take your Metformin the night before or the morning of your procedure.   On the morning of your procedure, take your Aspirin and any morning medicines NOT listed above.  You may use sips of water.  5. Plan for one night stay--bring personal belongings. 6. Bring a current list of your medications and  current insurance cards.  7. You MUST have a responsible person to drive you home. 8. Someone MUST be with you the first 24 hours after you arrive home or your discharge will be delayed. 9. Please wear clothes that are easy to get on and off and wear slip-on shoes.  Thank you for allowing [place] to care for you!   -- [facility]    If you need a refill on your cardiac medications before your next appointment, please call your pharmacy.      Signed, [name], MD  10/01/2017 6:07 PM    [facility] [place], [place], [place]  [address] Phone: [phone]; Fax: [phone]

## 2017-10-01 NOTE — Patient Instructions (Signed)
Medication Instructions:  Your physician recommends that you continue on your current medications as directed. Please refer to the Current Medication list given to you today.  Labwork: Your physician recommends that you return for lab work next week (BMET, CBC and INR)   Testing/Procedures: Your physician has requested that you have a cardiac catheterization. Cardiac catheterization is used to diagnose and/or treat various heart conditions. Doctors may recommend this procedure for a number of different reasons. The most common reason is to evaluate chest pain. Chest pain can be a symptom of coronary artery disease (CAD), and cardiac catheterization can show whether plaque is narrowing or blocking your heart's arteries. This procedure is also used to evaluate the valves, as well as measure the blood flow and oxygen levels in different parts of your heart. For further information please visit HugeFiesta.[email or web address]. Please follow instruction sheet, as given.   Follow-Up: Your physician recommends that you schedule a follow-up appointment in: 2 weeks after heart cath on 2/18   Any Other Special Instructions Will Be Listed Below (If Applicable).    [facility] OFF[facility] [address], [place] [address] [place] [address] Dept: [phone] Loc: [phone]  Eric Palmer  10/01/2017  You are scheduled for a Cardiac Catheterization on Monday, February 18 with Dr. Daneen Schick.  1. Please arrive at the [facility] (Main Entrance A) at [facility]: [address] [place], [place] [address] at 5:30 AM (two hours before your procedure to ensure your preparation). Free valet parking service is available.   Special note: Every effort is made to have your procedure done on time. Please understand that emergencies sometimes delay scheduled procedures.  2. Diet: Do not eat or drink anything after midnight prior to your  procedure except sips of water to take medications.  3. Labs: You will need to have labs drawn here in the office next week.  4. Medication instructions in preparation for your procedure:  Stop taking Xarelto (Rivaroxaban) on Friday, February 15.  Do not take your Metformin the night before or the morning of your procedure.   On the morning of your procedure, take your Aspirin and any morning medicines NOT listed above.  You may use sips of water.  5. Plan for one night stay--bring personal belongings. 6. Bring a current list of your medications and current insurance cards. 7. You MUST have a responsible person to drive you home. 8. Someone MUST be with you the first 24 hours after you arrive home or your discharge will be delayed. 9. Please wear clothes that are easy to get on and off and wear slip-on shoes.  Thank you for allowing [place] to care for you!   -- [facility]    If you need a refill on your cardiac medications before your next appointment, please call your pharmacy.

## 2017-10-01 NOTE — H&P (View-Only) (Signed)
Cardiology Office Note    Date:  10/01/2017   ID:  NGUYEN TODOROV, DOB 01-03-1956, MRN [id number]  PCP:  [name], MD  Cardiologist: [name], MD   Chief Complaint  Patient presents with  . Coronary Artery Disease  . Atrial Fibrillation    History of Present Illness:  Eric Palmer is a 62 y.o. male  with a history of coronary artery disease, remote right coronary stent in 1995, moderate mid LAD stenosis in 2007 by cath, and recent low risk myocardial perfusion study. Intercurrent diagnoses of diabetes mellitus, known history of hypertension, obesity, paroxysmal atrial fibrillation on sotalol therapy, and poorly controlled hyperlipidemia. Also history of obstructive sleep apnea.   The patient recently had an episode of atrial fibrillation that lasted greater than 45 minutes and led to an emergency room evaluation.  0 because he developed tightness in his chest which occurred several minutes into the episode of atrial fib.  He has had recurrent episodes of atrial fib in the past but never had chest tightness of this nature.  Because of the angina, he underwent a myocardial perfusion study recently which did not reveal significant difference compared with 2015 and 2018 studies.  All studies demonstrate both apical and inferior wall decreased uptake.  He is under a lot of stress at home, one son is opiate dependent.  Since being back in sinus rhythm, which occurred spontaneously in the emergency room, he has had no recurrent episodes of chest discomfort.  He has tried exercise on his treadmill but is limited by lower extremity fatigue and dyspnea.  He continues to smoke cigarettes has poor control of diabetes.   Past Medical History:  Diagnosis Date  . Anal fissure   . Chronic bronchitis ([place])    "get it q spring and fall" (10/27/2013)  . Coronary artery disease    a. s/p PCI in 1996. b. LHC 01/2006 showed 50% mid LAD, otherwise widely patent cors with minimal  irregularities. c. Nuclear stress test in 04/2009 was reportedly low risk, no ischemia. // d. Nuclear stress test 2/19: EF 55, transient ST depression in recovery, no ischemia, inf defect (artifact vs scar)   . DM type 2, uncontrolled, with neuropathy ([place])   . ED (erectile dysfunction)   . Essential hypertension   . Hx of echocardiogram    Echo (10/2013): Mild LVH, moderate focal basal hypertrophy of the septum, EF 50-55%, no RWMA  . Hyperlipidemia   . Internal hemorrhoids without mention of complication   . Obesity   . Obesity   . OSA on CPAP   . Paresthesia of both legs   . Paroxysmal atrial fibrillation ([name])    a. failed Tikosyn, Multaq. b. s/p ablation of AF/AFL in 12/2013.  [name] Paroxysmal atrial flutter ([place])    a. s/p ablation of AF/AFL in 12/2013.  [name] Smoker    QUIT 10/13/13    Past Surgical History:  Procedure Laterality Date  . ABLATION  01-12-2014   PVI and CTI by Dr [name]  . ANAL FISSURE REPAIR  ~ 2010  . ATRIAL FIBRILLATION ABLATION N/A 01/12/2014   Procedure: ATRIAL FIBRILLATION ABLATION;  Surgeon: Coralyn Mark, MD;  Location: [facility];  Service: Cardiovascular;  Laterality: N/A;  . CARDIAC CATHETERIZATION  2007  . CORONARY ANGIOPLASTY WITH STENT PLACEMENT  1996   residual 50% LAD and RCA by cath in 2007  . CORONARY ANGIOPLASTY WITH STENT PLACEMENT  1995  . INGUINAL HERNIA REPAIR Right  1963  . TEE WITHOUT CARDIOVERSION N/A 01/11/2014   Procedure: TRANSESOPHAGEAL ECHOCARDIOGRAM (TEE);  Surgeon: [name], MD;  Location: [facility];  Service: Cardiovascular;  Laterality: N/A;  . TONSILLECTOMY AND ADENOIDECTOMY  1979    Current Medications: Outpatient Medications Prior to Visit  Medication Sig Dispense Refill  . ADVAIR DISKUS 100-50 MCG/DOSE AEPB INHALE 1 PUFF INTO THE LUNGS TWICE DAILY 60 each 5  . atorvastatin (LIPITOR) 40 MG tablet Take 1 tablet (40 mg total) by mouth daily at 6 PM. 90 tablet 2  . diltiazem (CARDIZEM CD) 360 MG 24 hr capsule Take 1 capsule  (360 mg total) by mouth daily. 90 capsule 1  . fish oil-omega-3 fatty acids 1000 MG capsule Take 1 g by mouth 2 (two) times daily.     [name] LYRICA 100 MG capsule TAKE 1 CAPSULE BY MOUTH EVERY MORNING, 1 CAPSULE AT NOON, AND 2 CAPSULES EVERY NIGHT AT BEDTIME 360 capsule 5  . metFORMIN (GLUCOPHAGE-XR) 750 MG 24 hr tablet TAKE 1 TABLET BY MOUTH TWICE DAILY 180 tablet 1  . Multiple Vitamins-Minerals (CENTRUM SILVER 50+MEN PO) Take 1 tablet by mouth daily.    . nitroGLYCERIN (NITROSTAT) 0.4 MG SL tablet Place 0.4 mg under the tongue every 5 (five) minutes x 3 doses as needed for chest pain (Call 911 at 3rd dose within 15 minutes).    [name] DELICA LANCETS FINE MISC USE TO CHECK BLOOD SUGAR ONCE DAILY 100 each 0  . ONETOUCH VERIO test strip USE TO CHECK BLOOD SUGAR ONCE DAILY 100 each 0  . PROAIR HFA 108 (90 Base) MCG/ACT inhaler INHALE 2 PUFFS INTO THE LUNGS EVERY 2 HOURS AS NEEDED FOR WHEEZING OR SHORTNESS OF BREATH 8.5 g 0  . sotalol (BETAPACE) 80 MG tablet TAKE 1 TABLET(80 MG) BY MOUTH TWICE DAILY 180 tablet 2  . XARELTO 20 MG TABS tablet TAKE 1 TABLET(20 MG) BY MOUTH DAILY WITH SUPPER 90 tablet 1  . predniSONE (DELTASONE) 20 MG tablet Two daily with food (Patient not taking: Reported on 10/01/2017) 10 tablet 0   No facility-administered medications prior to visit.      Allergies:   Cephalosporins; Penicillins; Thallium; Codeine; Gabapentin; and Eliquis [apixaban]   Social History   Socioeconomic History  . Marital status: Married    Spouse name: None  . Number of children: None  . Years of education: None  . Highest education level: None  Social Needs  . Financial resource strain: None  . Food insecurity - worry: None  . Food insecurity - inability: None  . Transportation needs - medical: None  . Transportation needs - non-medical: None  Occupational History  . None  Tobacco Use  . Smoking status: Current Every Day Smoker    Packs/day: 1.00    Years: 43.00    Pack years: 43.00     Types: Cigarettes  . Smokeless tobacco: Never Used  Substance and Sexual Activity  . Alcohol use: Yes    Alcohol/week: 0.0 oz    Comment: 10/27/2013 "might have 1 beer/month"  . Drug use: No  . Sexual activity: Yes  Other Topics Concern  . None  Social History Narrative   Works as an Programme researcher, broadcasting/film/video for [employer] History:  The patient's family history includes Heart attack in his father; Heart disease in his other; Heart disease (age of onset: 66) in his father; Heart disease (age of onset: 31) in his mother; Hyperlipidemia in his other; Hypertension in his mother.  ROS:   Please see the history of present illness.    Stress, weight gain, concerned about the findings on his nuclear study although somewhat relieved to find that they were not greatly different than the prior studies in 2015 and 2018. All other systems reviewed and are negative.   PHYSICAL EXAM:   VS:  BP 120/82   Pulse 84   Ht 6\' 2"  (1.88 m)   Wt 251 lb (113.9 kg)   BMI 32.23 kg/m    GEN: Well nourished, well developed, in no acute distress.  Significant obesity. HEENT: normal  Neck: no JVD, carotid bruits, or masses Cardiac: RRR; no murmurs, rubs, or gallops,no edema  Respiratory:  clear to auscultation bilaterally, normal work of breathing GI: soft, nontender, nondistended, + BS MS: no deformity or atrophy  Skin: warm and dry, no rash Neuro:  Alert and Oriented x 3, Strength and sensation are intact Psych: euthymic mood, full affect  Wt Readings from Last 3 Encounters:  10/01/17 251 lb (113.9 kg)  09/15/17 248 lb 12.8 oz (112.9 kg)  08/15/17 245 lb 3.2 oz (111.2 kg)      Studies/Labs Reviewed:   EKG:  EKG the ECG tracing performed at 11:26 PM on 08/15/2017 reveals atrial flutter at a rate of 150 bpm.  This was the occurrence of chest tightness.  The next tracing performed on 09/15/2017 demonstrates normal sinus rhythm without acute ST-T wave change.  His last tracing was in office  tracing.  Recent Labs: 04/09/2017: ALT 27 08/15/2017: BUN 15; Creatinine, Ser 0.98; Hemoglobin 14.7; Platelets 208; Potassium 4.4; Sodium 136; TSH 2.363   Lipid Panel    Component Value Date/Time   CHOL 98 08/15/2017 0419   TRIG 318 (H) 08/15/2017 0419   HDL 29 (L) 08/15/2017 0419   CHOLHDL 3.4 08/15/2017 0419   VLDL 64 (H) 08/15/2017 0419   LDLCALC 5 08/15/2017 0419   LDLDIRECT 45.0 04/09/2017 1100    Additional studies/ records that were reviewed today include:  Myocardial perfusion imaging 09/24/2017: Study Highlights    Moderate defect in inferior, inferolateral, inferoseptal wall consistent with probable soft tissue attenuation (diaphragm), cannot exclude subendocardial scar No ischemia  Elctrically positive for ischemia though specificity limited (transient ST depression in recovery)  LVEF 55%  Overall low to intermediate risk scan.      ASSESSMENT:    1. Atrial fibrillation with RVR ([place])   2. Essential hypertension   3. CAD in native artery   4. Type 2 diabetes mellitus with diabetic polyneuropathy, without long-term current use of insulin ([place])   5. Tobacco use disorder      PLAN:  In order of problems listed above:  1. Most recent episode of chest discomfort occurring in setting of atrial flutter 150 bpm.  Once arrhythmia resolved, chest discomfort resolved.  This is the first time he is ever had chest discomfort associated with arrhythmia.  It is possible that the atrial flutter was at a faster rate than when he develops atrial fibrillation.  This could explain the chest discomfort.  The nuclear study is abnormal but not significantly different than prior.  Under this situation and with poor risk factor control, I recommend that he undergo coronary angiography. 2. Blood pressures well controlled. 3. He has known coronary artery disease, chronically abnormal myocardial perfusion study, and while in atrial flutter with heart rate of 150 bpm developed significant  angina but ruled out for myocardial infarction.  Subsequent nuclear study is unchanged compared to one year ago  and 4 years ago.  By this, I feel repeat coronary angiography would be helpful to ensure that there is not progression in degree of coronary disease given his multitude of risk factors including type 2 diabetes which is poorly controlled and continued smoking. 4. Not addressed other than encouraged to cut back on carbohydrates and take medications as scribed. 5. Still smoking predominantly cigars but smells of cigarette smoke.   The patient was counseled to undergo left heart catheterization, coronary angiography, and possible percutaneous coronary intervention with stent implantation. The procedural risks and benefits were discussed in detail. The risks discussed included death, stroke, myocardial infarction, life-threatening bleeding, limb ischemia, kidney injury, allergy, and possible emergency cardiac surgery. The risk of these significant complications were estimated to occur less than 1% of the time. After discussion, the patient has agreed to proceed.    Medication Adjustments/Labs and Tests Ordered: Current medicines are reviewed at length with the patient today.  Concerns regarding medicines are outlined above.  Medication changes, Labs and Tests ordered today are listed in the Patient Instructions below. Patient Instructions  Medication Instructions:  Your physician recommends that you continue on your current medications as directed. Please refer to the Current Medication list given to you today.  Labwork: Your physician recommends that you return for lab work next week (BMET, CBC and INR)   Testing/Procedures: Your physician has requested that you have a cardiac catheterization. Cardiac catheterization is used to diagnose and/or treat various heart conditions. Doctors may recommend this procedure for a number of different reasons. The most common reason is to evaluate chest  pain. Chest pain can be a symptom of coronary artery disease (CAD), and cardiac catheterization can show whether plaque is narrowing or blocking your heart's arteries. This procedure is also used to evaluate the valves, as well as measure the blood flow and oxygen levels in different parts of your heart. For further information please visit [email or web address]. Please follow instruction sheet, as given.   Follow-Up: Your physician recommends that you schedule a follow-up appointment in: 2 weeks after heart cath on 2/18   Any Other Special Instructions Will Be Listed Below (If Applicable).    [facility] [address] [place] [address] [place] [address] Dept: [phone] Loc: [phone]  Eric Palmer  10/01/2017  You are scheduled for a Cardiac Catheterization on Monday, February 18 with Dr. Daneen Schick.  1. Please arrive at the [facility] (Main Entrance A) at [facility]: [address] [place], [place] [address] at 5:30 AM (two hours before your procedure to ensure your preparation). Free valet parking service is available.   Special note: Every effort is made to have your procedure done on time. Please understand that emergencies sometimes delay scheduled procedures.  2. Diet: Do not eat or drink anything after midnight prior to your procedure except sips of water to take medications.  3. Labs: You will need to have labs drawn here in the office next week.  4. Medication instructions in preparation for your procedure:  Stop taking Xarelto (Rivaroxaban) on Friday, February 15.  Do not take your Metformin the night before or the morning of your procedure.   On the morning of your procedure, take your Aspirin and any morning medicines NOT listed above.  You may use sips of water.  5. Plan for one night stay--bring personal belongings. 6. Bring a current list of your medications and  current insurance cards.  7. You MUST have a responsible person to drive you home. 8. Someone MUST be with you the first 24 hours after you arrive home or your discharge will be delayed. 9. Please wear clothes that are easy to get on and off and wear slip-on shoes.  Thank you for allowing [place] to care for you!   -- [facility]    If you need a refill on your cardiac medications before your next appointment, please call your pharmacy.      Signed, [name], MD  10/01/2017 6:07 PM    [facility] [place], [place], [place]  [address] Phone: [phone]; Fax: [phone]

## 2017-10-04 ENCOUNTER — Other Ambulatory Visit: Payer: 59 | Admitting: *Deleted

## 2017-10-04 DIAGNOSIS — I4891 Unspecified atrial fibrillation: Secondary | ICD-10-CM | POA: Diagnosis not present

## 2017-10-04 DIAGNOSIS — I2510 Atherosclerotic heart disease of native coronary artery without angina pectoris: Secondary | ICD-10-CM | POA: Diagnosis not present

## 2017-10-04 LAB — PROTIME-INR
INR: 1.2 (ref 0.8–1.2)
Prothrombin Time: 12.2 s — ABNORMAL HIGH (ref 9.1–12.0)

## 2017-10-04 LAB — BASIC METABOLIC PANEL
BUN/Creatinine Ratio: 15 (ref 10–24)
BUN: 13 mg/dL (ref 8–27)
CO2: 23 mmol/L (ref 20–29)
Calcium: 9.6 mg/dL (ref 8.6–10.2)
Chloride: 98 mmol/L (ref 96–106)
Creatinine, Ser: 0.87 mg/dL (ref 0.76–1.27)
GFR calc Af Amer: 108 mL/min/{1.73_m2} (ref >59)
GFR calc non Af Amer: 94 mL/min/{1.73_m2} (ref >59)
Glucose: 161 mg/dL — ABNORMAL HIGH (ref 65–99)
Potassium: 4.2 mmol/L (ref 3.5–5.2)
Sodium: 137 mmol/L (ref 134–144)

## 2017-10-04 LAB — CBC
Hematocrit: 44.3 % (ref 37.5–51.0)
Hemoglobin: 15.3 g/dL (ref 13.0–17.7)
MCH: 31 pg (ref 26.6–33.0)
MCHC: 34.5 g/dL (ref 31.5–35.7)
MCV: 90 fL (ref 79–97)
Platelets: 254 10*3/uL (ref 150–379)
RBC: 4.94 x10E6/uL (ref 4.14–5.80)
RDW: 13.4 % (ref 12.3–15.4)
WBC: 9.9 10*3/uL (ref 3.4–10.8)

## 2017-10-07 ENCOUNTER — Telehealth: Payer: Self-pay | Admitting: *Deleted

## 2017-10-07 NOTE — Telephone Encounter (Signed)
Pt contacted pre-catheterization scheduled at [facility] for: Monday October 11, 2017 at 7:30 AM Verified arrival time and place: [place] at: 5:30 AM Verified allergies listed in Epic.  Hold: Stop Xarelto 10/08/17 until after cath. No metformin 10/10/17, 10/11/17 and 48 hours post cath  Except hold medications AM meds can be  taken pre-cath with sip of water including: ASA 81 mg am of cath   Confirmed patient has responsible person to drive home post procedure and observe patient for 24 hours: yes

## 2017-10-11 ENCOUNTER — Ambulatory Visit (HOSPITAL_COMMUNITY)
Admission: RE | Admit: 2017-10-11 | Discharge: 2017-10-11 | Disposition: A | Discharge status: Home / Self Care | Payer: 59 | Source: Ambulatory Visit | Attending: Interventional Cardiology | Admitting: Interventional Cardiology

## 2017-10-11 ENCOUNTER — Encounter (HOSPITAL_COMMUNITY): Payer: Self-pay | Admitting: Interventional Cardiology

## 2017-10-11 ENCOUNTER — Encounter (HOSPITAL_COMMUNITY)
Admission: RE | Disposition: A | Discharge status: Home / Self Care | Payer: Self-pay | Source: Ambulatory Visit | Attending: Interventional Cardiology

## 2017-10-11 DIAGNOSIS — Z8249 Family history of ischemic heart disease and other diseases of the circulatory system: Secondary | ICD-10-CM | POA: Diagnosis not present

## 2017-10-11 DIAGNOSIS — R9439 Abnormal result of other cardiovascular function study: Secondary | ICD-10-CM | POA: Diagnosis present

## 2017-10-11 DIAGNOSIS — I25119 Atherosclerotic heart disease of native coronary artery with unspecified angina pectoris: Secondary | ICD-10-CM | POA: Diagnosis not present

## 2017-10-11 DIAGNOSIS — I2510 Atherosclerotic heart disease of native coronary artery without angina pectoris: Secondary | ICD-10-CM | POA: Diagnosis not present

## 2017-10-11 DIAGNOSIS — G4733 Obstructive sleep apnea (adult) (pediatric): Secondary | ICD-10-CM | POA: Diagnosis present

## 2017-10-11 DIAGNOSIS — I480 Paroxysmal atrial fibrillation: Secondary | ICD-10-CM | POA: Insufficient documentation

## 2017-10-11 DIAGNOSIS — F17210 Nicotine dependence, cigarettes, uncomplicated: Secondary | ICD-10-CM | POA: Diagnosis not present

## 2017-10-11 DIAGNOSIS — I2584 Coronary atherosclerosis due to calcified coronary lesion: Secondary | ICD-10-CM | POA: Insufficient documentation

## 2017-10-11 DIAGNOSIS — Y831 Surgical operation with implant of artificial internal device as the cause of abnormal reaction of the patient, or of later complication, without mention of misadventure at the time of the procedure: Secondary | ICD-10-CM | POA: Insufficient documentation

## 2017-10-11 DIAGNOSIS — Z7951 Long term (current) use of inhaled steroids: Secondary | ICD-10-CM | POA: Insufficient documentation

## 2017-10-11 DIAGNOSIS — Z7901 Long term (current) use of anticoagulants: Secondary | ICD-10-CM

## 2017-10-11 DIAGNOSIS — Z7984 Long term (current) use of oral hypoglycemic drugs: Secondary | ICD-10-CM | POA: Insufficient documentation

## 2017-10-11 DIAGNOSIS — I483 Typical atrial flutter: Secondary | ICD-10-CM | POA: Diagnosis present

## 2017-10-11 DIAGNOSIS — I10 Essential (primary) hypertension: Secondary | ICD-10-CM | POA: Insufficient documentation

## 2017-10-11 DIAGNOSIS — E1142 Type 2 diabetes mellitus with diabetic polyneuropathy: Secondary | ICD-10-CM | POA: Diagnosis not present

## 2017-10-11 DIAGNOSIS — Z880 Allergy status to penicillin: Secondary | ICD-10-CM | POA: Diagnosis not present

## 2017-10-11 DIAGNOSIS — J42 Unspecified chronic bronchitis: Secondary | ICD-10-CM | POA: Diagnosis not present

## 2017-10-11 DIAGNOSIS — E785 Hyperlipidemia, unspecified: Secondary | ICD-10-CM | POA: Diagnosis not present

## 2017-10-11 DIAGNOSIS — E669 Obesity, unspecified: Secondary | ICD-10-CM | POA: Diagnosis not present

## 2017-10-11 DIAGNOSIS — F17200 Nicotine dependence, unspecified, uncomplicated: Secondary | ICD-10-CM | POA: Diagnosis present

## 2017-10-11 DIAGNOSIS — T82855A Stenosis of coronary artery stent, initial encounter: Secondary | ICD-10-CM | POA: Diagnosis not present

## 2017-10-11 DIAGNOSIS — Z6832 Body mass index (BMI) 32.0-32.9, adult: Secondary | ICD-10-CM | POA: Insufficient documentation

## 2017-10-11 HISTORY — PX: LEFT HEART CATH AND CORONARY ANGIOGRAPHY: CATH118249

## 2017-10-11 LAB — GLUCOSE, CAPILLARY
Glucose-Capillary: 158 mg/dL — ABNORMAL HIGH (ref 65–99)
Glucose-Capillary: 282 mg/dL — ABNORMAL HIGH (ref 65–99)

## 2017-10-11 SURGERY — LEFT HEART CATH AND CORONARY ANGIOGRAPHY
Anesthesia: LOCAL

## 2017-10-11 MED ORDER — SODIUM CHLORIDE 0.9 % IV SOLN: 250.0000 mL | INTRAVENOUS | Status: DC | PRN

## 2017-10-11 MED ORDER — SODIUM CHLORIDE 0.9 % WEIGHT BASED INFUSION
3.0000 mL/kg/h | INTRAVENOUS | Status: AC
  Administered 2017-10-11: 3 mL/kg/h via INTRAVENOUS

## 2017-10-11 MED ORDER — SODIUM CHLORIDE 0.9 % WEIGHT BASED INFUSION: 1.0000 mL/kg/h | INTRAVENOUS | Status: DC

## 2017-10-11 MED ORDER — HEPARIN SODIUM (PORCINE) 1000 UNIT/ML IJ SOLN
INTRAMUSCULAR | Status: AC
  Filled 2017-10-11: qty 1

## 2017-10-11 MED ORDER — IOPAMIDOL (ISOVUE-370) INJECTION 76%
INTRAVENOUS | Status: DC | PRN
  Administered 2017-10-11: 47 mL

## 2017-10-11 MED ORDER — VERAPAMIL HCL 2.5 MG/ML IV SOLN
INTRAVENOUS | Status: AC
  Filled 2017-10-11: qty 2

## 2017-10-11 MED ORDER — HEPARIN (PORCINE) IN NACL 2-0.9 UNIT/ML-% IJ SOLN
INTRAMUSCULAR | Status: AC | PRN
  Administered 2017-10-11: 500 mL

## 2017-10-11 MED ORDER — SODIUM CHLORIDE 0.9 % IV SOLN: INTRAVENOUS | Status: DC

## 2017-10-11 MED ORDER — SODIUM CHLORIDE 0.9% FLUSH: 3.0000 mL | Freq: Two times a day (BID) | INTRAVENOUS | Status: DC

## 2017-10-11 MED ORDER — FENTANYL CITRATE (PF) 100 MCG/2ML IJ SOLN
INTRAMUSCULAR | Status: DC | PRN
  Administered 2017-10-11: 50 ug via INTRAVENOUS

## 2017-10-11 MED ORDER — ASPIRIN 81 MG PO CHEW: 81.0000 mg | CHEWABLE_TABLET | ORAL | Status: DC

## 2017-10-11 MED ORDER — ACETAMINOPHEN 325 MG PO TABS: 650.0000 mg | ORAL_TABLET | ORAL | Status: DC | PRN

## 2017-10-11 MED ORDER — HEPARIN (PORCINE) IN NACL 2-0.9 UNIT/ML-% IJ SOLN
INTRAMUSCULAR | Status: AC
  Filled 2017-10-11: qty 1000

## 2017-10-11 MED ORDER — HEPARIN SODIUM (PORCINE) 1000 UNIT/ML IJ SOLN
INTRAMUSCULAR | Status: DC | PRN
  Administered 2017-10-11: 5000 [IU] via INTRAVENOUS

## 2017-10-11 MED ORDER — SODIUM CHLORIDE 0.9% FLUSH: 3.0000 mL | INTRAVENOUS | Status: DC | PRN

## 2017-10-11 MED ORDER — ASPIRIN 81 MG PO CHEW: 81.0000 mg | CHEWABLE_TABLET | Freq: Every day | ORAL | Status: DC

## 2017-10-11 MED ORDER — LIDOCAINE HCL (PF) 1 % IJ SOLN
INTRAMUSCULAR | Status: DC | PRN
  Administered 2017-10-11: 2 mL

## 2017-10-11 MED ORDER — VERAPAMIL HCL 2.5 MG/ML IV SOLN
INTRAVENOUS | Status: DC | PRN
  Administered 2017-10-11: 10 mL via INTRA_ARTERIAL

## 2017-10-11 MED ORDER — FENTANYL CITRATE (PF) 100 MCG/2ML IJ SOLN
INTRAMUSCULAR | Status: AC
  Filled 2017-10-11: qty 2

## 2017-10-11 MED ORDER — ONDANSETRON HCL 4 MG/2ML IJ SOLN: 4.0000 mg | Freq: Four times a day (QID) | INTRAMUSCULAR | Status: DC | PRN

## 2017-10-11 MED ORDER — IOPAMIDOL (ISOVUE-370) INJECTION 76%
INTRAVENOUS | Status: AC
  Filled 2017-10-11: qty 100

## 2017-10-11 MED ORDER — MIDAZOLAM HCL 2 MG/2ML IJ SOLN
INTRAMUSCULAR | Status: DC | PRN
  Administered 2017-10-11 (×2): 1 mg via INTRAVENOUS

## 2017-10-11 MED ORDER — LIDOCAINE HCL (PF) 1 % IJ SOLN
INTRAMUSCULAR | Status: AC
  Filled 2017-10-11: qty 30

## 2017-10-11 MED ORDER — MIDAZOLAM HCL 2 MG/2ML IJ SOLN
INTRAMUSCULAR | Status: AC
  Filled 2017-10-11: qty 2

## 2017-10-11 MED ORDER — HEPARIN (PORCINE) IN NACL 2-0.9 UNIT/ML-% IJ SOLN
INTRAMUSCULAR | Status: DC | PRN
  Administered 2017-10-11: 08:00:00

## 2017-10-11 SURGICAL SUPPLY — 14 items
CATH INFINITI 5 FR JL3.5 (CATHETERS) ×2 IMPLANT
CATH INFINITI JR4 5F (CATHETERS) ×2 IMPLANT
COVER PRB 48X5XTLSCP FOLD TPE (BAG) ×1 IMPLANT
COVER PROBE 5X48 (BAG) ×1
DEVICE RAD COMP TR BAND LRG (VASCULAR PRODUCTS) ×2 IMPLANT
GLIDESHEATH SLEND A-KIT 6F 22G (SHEATH) ×2 IMPLANT
GUIDEWIRE INQWIRE 1.5J.035X260 (WIRE) ×1 IMPLANT
INQWIRE 1.5J .035X260CM (WIRE) ×2
KIT PREMIUM HAND CONTROLLER (KITS) ×2 IMPLANT
KIT SINGLE USE MANIFOLD (KITS) ×2 IMPLANT
KIT SYR MULTI USE (KITS) ×2 IMPLANT
PACK CARDIAC CATHETERIZATION (CUSTOM PROCEDURE TRAY) ×2 IMPLANT
PROTECTION STATION PRESSURIZED (MISCELLANEOUS) ×2
STATION PROTECTION PRESSURIZED (MISCELLANEOUS) ×1 IMPLANT

## 2017-10-11 NOTE — Interval H&P Note (Signed)
Cath Lab Visit (complete for each Cath Lab visit)  Clinical Evaluation Leading to the Procedure:   ACS: No.  Non-ACS:    Anginal Classification: CCS III  Anti-ischemic medical therapy: Minimal Therapy (1 class of medications)  Non-Invasive Test Results: No non-invasive testing performed  Prior CABG: No previous CABG      History and Physical Interval Note:  10/11/2017 7:19 AM  Eric Palmer  has presented today for surgery, with the diagnosis of Abnormal Stress Test  The various methods of treatment have been discussed with the patient and family. After consideration of risks, benefits and other options for treatment, the patient has consented to  Procedure(s): LEFT HEART CATH AND CORONARY ANGIOGRAPHY (N/A) as a surgical intervention .  The patient's history has been reviewed, patient examined, no change in status, stable for surgery.  I have reviewed the patient's chart and labs.  Questions were answered to the patient's satisfaction.     [name]

## 2017-10-11 NOTE — Discharge Instructions (Signed)

## 2017-10-27 NOTE — Progress Notes (Signed)
Cardiology Office Note    Date:  10/28/2017   ID:  Eric Palmer, DOB 1956-04-09, MRN [id number]  PCP:  [name], MD  Cardiologist: [name], MD   Chief Complaint  Patient presents with  . Coronary Artery Disease    History of Present Illness:  Eric Palmer is a 62 y.o. male  with a history of coronary artery disease, remote right coronary stent in 1995, moderate mid LAD stenosis in 2007 by cath, and recent low risk myocardial perfusion study. Intercurrent diagnoses of diabetes mellitus, known history of hypertension, obesity, paroxysmal atrial fibrillation on sotalol therapy, and poorly controlled hyperlipidemia. Also history of obstructive sleep apnea.   He feels well.  He has had no angina.  No recurrence of tachyarrhythmia.  No medication side effects.  No post cath local site complications.   Past Medical History:  Diagnosis Date  . Anal fissure   . Chronic bronchitis ([place])    "get it q spring and fall" (10/27/2013)  . Coronary artery disease    a. s/p PCI in 1996. b. LHC 01/2006 showed 50% mid LAD, otherwise widely patent cors with minimal irregularities. c. Nuclear stress test in 04/2009 was reportedly low risk, no ischemia. // d. Nuclear stress test 2/19: EF 55, transient ST depression in recovery, no ischemia, inf defect (artifact vs scar)   . DM type 2, uncontrolled, with neuropathy ([place])   . ED (erectile dysfunction)   . Essential hypertension   . Hx of echocardiogram    Echo (10/2013): Mild LVH, moderate focal basal hypertrophy of the septum, EF 50-55%, no RWMA  . Hyperlipidemia   . Internal hemorrhoids without mention of complication   . Obesity   . Obesity   . OSA on CPAP   . Paresthesia of both legs   . Paroxysmal atrial fibrillation ([name])    a. failed Tikosyn, Multaq. b. s/p ablation of AF/AFL in 12/2013.  [name] Paroxysmal atrial flutter ([place])    a. s/p ablation of AF/AFL in 12/2013.  [name] Smoker    QUIT 10/13/13    Past Surgical History:    Procedure Laterality Date  . ABLATION  01-12-2014   PVI and CTI by Dr [name]  . ANAL FISSURE REPAIR  ~ 2010  . ATRIAL FIBRILLATION ABLATION N/A 01/12/2014   Procedure: ATRIAL FIBRILLATION ABLATION;  Surgeon: Coralyn Mark, MD;  Location: [facility];  Service: Cardiovascular;  Laterality: N/A;  . CARDIAC CATHETERIZATION  2007  . CORONARY ANGIOPLASTY WITH STENT PLACEMENT  1996   residual 50% LAD and RCA by cath in 2007  . CORONARY ANGIOPLASTY WITH STENT PLACEMENT  1995  . INGUINAL HERNIA REPAIR Right 1963  . LEFT HEART CATH AND CORONARY ANGIOGRAPHY N/A 10/11/2017   Procedure: LEFT HEART CATH AND CORONARY ANGIOGRAPHY;  Surgeon: [name], MD;  Location: [facility];  Service: Cardiovascular;  Laterality: N/A;  . TEE WITHOUT CARDIOVERSION N/A 01/11/2014   Procedure: TRANSESOPHAGEAL ECHOCARDIOGRAM (TEE);  Surgeon: [name], MD;  Location: [facility];  Service: Cardiovascular;  Laterality: N/A;  . TONSILLECTOMY AND ADENOIDECTOMY  1979    Current Medications: Outpatient Medications Prior to Visit  Medication Sig Dispense Refill  . acetaminophen (TYLENOL) 500 MG tablet Take 500 mg by mouth every 6 (six) hours as needed for moderate pain or headache.    . ADVAIR DISKUS 100-50 MCG/DOSE AEPB INHALE 1 PUFF INTO THE LUNGS TWICE DAILY 60 each 5  . atorvastatin (LIPITOR) 40 MG tablet  Take 1 tablet (40 mg total) by mouth daily at 6 PM. 90 tablet 2  . diltiazem (CARDIZEM CD) 360 MG 24 hr capsule Take 1 capsule (360 mg total) by mouth daily. 90 capsule 1  . fish oil-omega-3 fatty acids 1000 MG capsule Take 1 g by mouth 2 (two) times daily.     [name] LYRICA 100 MG capsule TAKE 1 CAPSULE BY MOUTH EVERY MORNING, 1 CAPSULE AT NOON, AND 2 CAPSULES EVERY NIGHT AT BEDTIME (Patient taking differently: TAKE 100 MG BY MOUTH EVERY MORNING, 100 MG AT NOON, AND 200 MG EVERY NIGHT AT BEDTIME) 360 capsule 5  . metFORMIN (GLUCOPHAGE-XR) 750 MG 24 hr tablet TAKE 1 TABLET BY MOUTH TWICE DAILY (Patient taking  differently: TAKE 750 MG BY MOUTH TWICE DAILY) 180 tablet 1  . Multiple Vitamins-Minerals (CENTRUM SILVER 50+MEN PO) Take 1 tablet by mouth daily.    . nitroGLYCERIN (NITROSTAT) 0.4 MG SL tablet Place 0.4 mg under the tongue every 5 (five) minutes x 3 doses as needed for chest pain (Call 911 at 3rd dose within 15 minutes).    [name] DELICA LANCETS FINE MISC USE TO CHECK BLOOD SUGAR ONCE DAILY 100 each 0  . ONETOUCH VERIO test strip USE TO CHECK BLOOD SUGAR ONCE DAILY 100 each 0  . PROAIR HFA 108 (90 Base) MCG/ACT inhaler INHALE 2 PUFFS INTO THE LUNGS EVERY 2 HOURS AS NEEDED FOR WHEEZING OR SHORTNESS OF BREATH 8.5 g 0  . sotalol (BETAPACE) 80 MG tablet TAKE 1 TABLET(80 MG) BY MOUTH TWICE DAILY 180 tablet 2  . XARELTO 20 MG TABS tablet TAKE 1 TABLET(20 MG) BY MOUTH DAILY WITH SUPPER 90 tablet 1   No facility-administered medications prior to visit.      Allergies:   Cephalosporins; Penicillins; Thallium; Codeine; Gabapentin; and Eliquis [apixaban]   Social History   Socioeconomic History  . Marital status: Married    Spouse name: None  . Number of children: None  . Years of education: None  . Highest education level: None  Social Needs  . Financial resource strain: None  . Food insecurity - worry: None  . Food insecurity - inability: None  . Transportation needs - medical: None  . Transportation needs - non-medical: None  Occupational History  . None  Tobacco Use  . Smoking status: Current Every Day Smoker    Packs/day: 1.00    Years: 43.00    Pack years: 43.00    Types: Cigarettes  . Smokeless tobacco: Never Used  Substance and Sexual Activity  . Alcohol use: Yes    Alcohol/week: 0.0 oz    Comment: 10/27/2013 "might have 1 beer/month"  . Drug use: No  . Sexual activity: Yes  Other Topics Concern  . None  Social History Narrative   Works as an Programme researcher, broadcasting/film/video for [employer] History:  The patient's family history includes Heart attack in his father; Heart disease  in his other; Heart disease (age of onset: 65) in his father; Heart disease (age of onset: 77) in his mother; Hyperlipidemia in his other; Hypertension in his mother.   ROS:   Please see the history of present illness.    No problems  All other systems reviewed and are negative.   PHYSICAL EXAM:   VS:  BP 120/70   Pulse 95   Ht 6\' 2"  (1.88 m)   Wt 252 lb (114.3 kg)   BMI 32.35 kg/m    GEN: Well nourished, well developed, in no  acute distress  HEENT: normal  Neck: no JVD, carotid bruits, or masses Cardiac: RRR; no murmurs, rubs, or gallops,no edema  Respiratory:  clear to auscultation bilaterally, normal work of breathing GI: soft, nontender, nondistended, + BS MS: no deformity or atrophy  Skin: warm and dry, no rash Neuro:  Alert and Oriented x 3, Strength and sensation are intact Psych: euthymic mood, full affect  Wt Readings from Last 3 Encounters:  10/28/17 252 lb (114.3 kg)  10/11/17 247 lb (112 kg)  10/01/17 251 lb (113.9 kg)      Studies/Labs Reviewed:   EKG:  EKG not repeated  Recent Labs: 04/09/2017: ALT 27 08/15/2017: TSH 2.363 10/04/2017: BUN 13; Creatinine, Ser 0.87; Hemoglobin 15.3; Platelets 254; Potassium 4.2; Sodium 137   Lipid Panel    Component Value Date/Time   CHOL 98 08/15/2017 0419   TRIG 318 (H) 08/15/2017 0419   HDL 29 (L) 08/15/2017 0419   CHOLHDL 3.4 08/15/2017 0419   VLDL 64 (H) 08/15/2017 0419   LDLCALC 5 08/15/2017 0419   LDLDIRECT 45.0 04/09/2017 1100    Additional studies/ records that were reviewed today include:  Coronary angiography 10/11/17:  Conclusion    50% proximal RCA in-stent restenosis.  The patient is otherwise widely patent.  Left main is widely patent  LAD contains mid 50-70% narrowing, calcified, and similar to prior angiogram performed in 2007.  Recent nuclear study did not demonstrate any evidence of anterior apical ischemia.  First diagonal contains 30% mid body narrowing.  Normal distribution with 3  moderate to large obtuse marginals.  Basal inferior wall hypokinesis.  Preserved overall LV function with EF 50-55%.  Elevated LVEDP, 18 mmHg.   RECOMMENDATIONS:    Continue medical therapy.  If LAD intervention becomes required, rotational or orbital atherectomy should be considered.   Coronary Diagrams   Diagnostic Diagram            ASSESSMENT:    1. Coronary artery disease involving native coronary artery of native heart with angina pectoris ([place])   2. Typical atrial flutter ([place])   3. Chronic anticoagulation   4. Type 2 diabetes mellitus with diabetic polyneuropathy, without long-term current use of insulin ([name])   5. Other hyperlipidemia      PLAN:  In order of problems listed above:  1. No angina.  Cath did not demonstrate high-grade disease or any change in LAD disease compared to prior imaging. 2. No recurrence of atrial flutter 3. Continue Xarelto.  Let us know if any bleeding complications. 4. Hemoglobin A1c target less than 7.  Encourage the patient to exercise and to decrease carbohydrate intake. 5. LDL target less than 70.  Continue high intensity statin therapy.  Exercise and weight loss will help.  Coronary disease is static.  The patient needs to make every effort to control risk factors.  Hemoglobin A1c less than 7, LDL cholesterol of 70 or less, blood pressure less than or equal to 130/80 mmHg, weight loss, aerobic exercise, and cessation of smoking all discussed.    Medication Adjustments/Labs and Tests Ordered: Current medicines are reviewed at length with the patient today.  Concerns regarding medicines are outlined above.  Medication changes, Labs and Tests ordered today are listed in the Patient Instructions below. Patient Instructions  Medication Instructions:  Your physician recommends that you continue on your current medications as directed. Please refer to the Current Medication list given to you  today.  Labwork: None  Testing/Procedures: None  Follow-Up: Your physician wants you to  follow-up in: 6-9 months with Dr. Tamala Julian.  You will receive a reminder letter in the mail two months in advance. If you don't receive a letter, please call our office to schedule the follow-up appointment.   Any Other Special Instructions Will Be Listed Below (If Applicable).  Your physician recommends that you exercise, lose weight and monitor your diet to help control your blood sugars.  He also recommends that you wear your CPAP as directed.    If you need a refill on your cardiac medications before your next appointment, please call your pharmacy.      Signed, [name], MD  10/28/2017 4:53 PM    [facility] [address], [place], [place]  [address] Phone: [phone]; Fax: [phone]

## 2017-10-28 ENCOUNTER — Encounter: Payer: Self-pay | Admitting: Interventional Cardiology

## 2017-10-28 ENCOUNTER — Ambulatory Visit: Payer: 59 | Admitting: Interventional Cardiology

## 2017-10-28 VITALS — BP 120/70 | HR 95 | Ht 74.0 in | Wt 252.0 lb

## 2017-10-28 DIAGNOSIS — E7849 Other hyperlipidemia: Secondary | ICD-10-CM

## 2017-10-28 DIAGNOSIS — E1142 Type 2 diabetes mellitus with diabetic polyneuropathy: Secondary | ICD-10-CM | POA: Diagnosis not present

## 2017-10-28 DIAGNOSIS — Z7901 Long term (current) use of anticoagulants: Secondary | ICD-10-CM | POA: Diagnosis not present

## 2017-10-28 DIAGNOSIS — I483 Typical atrial flutter: Secondary | ICD-10-CM

## 2017-10-28 DIAGNOSIS — I25119 Atherosclerotic heart disease of native coronary artery with unspecified angina pectoris: Secondary | ICD-10-CM

## 2017-10-28 NOTE — Patient Instructions (Signed)
Medication Instructions:  Your physician recommends that you continue on your current medications as directed. Please refer to the Current Medication list given to you today.  Labwork: None  Testing/Procedures: None  Follow-Up: Your physician wants you to follow-up in: 6-9 months with Dr. Tamala Julian.  You will receive a reminder letter in the mail two months in advance. If you don't receive a letter, please call our office to schedule the follow-up appointment.   Any Other Special Instructions Will Be Listed Below (If Applicable).  Your physician recommends that you exercise, lose weight and monitor your diet to help control your blood sugars.  He also recommends that you wear your CPAP as directed.    If you need a refill on your cardiac medications before your next appointment, please call your pharmacy.

## 2017-10-29 ENCOUNTER — Other Ambulatory Visit: Payer: Self-pay | Admitting: Family Medicine

## 2017-11-05 ENCOUNTER — Other Ambulatory Visit: Payer: Self-pay | Admitting: Family Medicine

## 2017-11-24 DIAGNOSIS — H04123 Dry eye syndrome of bilateral lacrimal glands: Secondary | ICD-10-CM | POA: Diagnosis not present

## 2017-11-24 DIAGNOSIS — H02839 Dermatochalasis of unspecified eye, unspecified eyelid: Secondary | ICD-10-CM | POA: Diagnosis not present

## 2017-11-24 DIAGNOSIS — H00022 Hordeolum internum right lower eyelid: Secondary | ICD-10-CM | POA: Diagnosis not present

## 2017-11-25 ENCOUNTER — Ambulatory Visit: Payer: 59 | Admitting: Interventional Cardiology

## 2017-11-25 ENCOUNTER — Other Ambulatory Visit: Payer: Self-pay | Admitting: Interventional Cardiology

## 2017-12-05 ENCOUNTER — Other Ambulatory Visit: Payer: Self-pay | Admitting: Interventional Cardiology

## 2017-12-05 DIAGNOSIS — I480 Paroxysmal atrial fibrillation: Secondary | ICD-10-CM

## 2017-12-06 NOTE — Telephone Encounter (Signed)
Pt is a 62 yr old male. Weight on 10/28/17 was 114.3Kg. Pt saw Dr. Tamala Julian on 10/28/17. Last serum creatine on 10/04/17 was 0.87. CrCL is 146 mL/min. Will refill Xarelto 20mg QD.

## 2017-12-08 DIAGNOSIS — H00022 Hordeolum internum right lower eyelid: Secondary | ICD-10-CM | POA: Diagnosis not present

## 2017-12-08 DIAGNOSIS — H02839 Dermatochalasis of unspecified eye, unspecified eyelid: Secondary | ICD-10-CM | POA: Diagnosis not present

## 2017-12-08 DIAGNOSIS — H04123 Dry eye syndrome of bilateral lacrimal glands: Secondary | ICD-10-CM | POA: Diagnosis not present

## 2017-12-08 DIAGNOSIS — G4733 Obstructive sleep apnea (adult) (pediatric): Secondary | ICD-10-CM | POA: Diagnosis not present

## 2017-12-31 ENCOUNTER — Other Ambulatory Visit: Payer: Self-pay | Admitting: Interventional Cardiology

## 2018-01-07 ENCOUNTER — Encounter: Payer: Self-pay | Admitting: Cardiology

## 2018-01-07 ENCOUNTER — Ambulatory Visit: Payer: 59 | Admitting: Cardiology

## 2018-01-07 VITALS — BP 136/66 | HR 83 | Ht 74.0 in | Wt 258.0 lb

## 2018-01-07 DIAGNOSIS — E669 Obesity, unspecified: Secondary | ICD-10-CM | POA: Diagnosis not present

## 2018-01-07 DIAGNOSIS — I10 Essential (primary) hypertension: Secondary | ICD-10-CM

## 2018-01-07 DIAGNOSIS — G4733 Obstructive sleep apnea (adult) (pediatric): Secondary | ICD-10-CM

## 2018-01-07 NOTE — Progress Notes (Signed)
Cardiology Office Note:    Date:  01/07/2018   ID:  Eric Palmer, DOB 1955/10/19, MRN [id number]  PCP:  [name], MD  Cardiologist:  [name], MD    Referring MD: [name], MD   Chief Complaint  Patient presents with  . Sleep Apnea  . Hypertension    History of Present Illness:    Eric Palmer is a 62 y.o. male with a hx of ASCAD, PAF, dyslipidemia and a history of OSA who is here for followup of OSA. He was diagnosed with OSA years ago and has been on PAP.  He has not seen me since 2016.  He is now here to reestablish care.He is doing well with his PAP device.  He tolerates the mask and feels the pressure is adequate.  Since going on PAP he feels rested in the am and has no significant daytime sleepiness.  He denies any significant mouth or nasal dryness or nasal congestion.  He does not think that he snores.     Past Medical History:  Diagnosis Date  . Anal fissure   . Chronic bronchitis ([place])    "get it q spring and fall" (10/27/2013)  . Coronary artery disease    a. s/p PCI in 1996. b. LHC 01/2006 showed 50% mid LAD, otherwise widely patent cors with minimal irregularities. c. Nuclear stress test in 04/2009 was reportedly low risk, no ischemia. // d. Nuclear stress test 2/19: EF 55, transient ST depression in recovery, no ischemia, inf defect (artifact vs scar)   . DM type 2, uncontrolled, with neuropathy ([place])   . ED (erectile dysfunction)   . Essential hypertension   . Hx of echocardiogram    Echo (10/2013): Mild LVH, moderate focal basal hypertrophy of the septum, EF 50-55%, no RWMA  . Hyperlipidemia   . Internal hemorrhoids without mention of complication   . Obesity   . Obesity   . OSA on CPAP   . Paresthesia of both legs   . Paroxysmal atrial fibrillation ([name])    a. failed Tikosyn, Multaq. b. s/p ablation of AF/AFL in 12/2013.  [name] Paroxysmal atrial flutter ([place])    a. s/p ablation of AF/AFL in 12/2013.  [name] Smoker    QUIT 10/13/13     Past Surgical History:  Procedure Laterality Date  . ABLATION  01-12-2014   PVI and CTI by Dr [name]  . ANAL FISSURE REPAIR  ~ 2010  . ATRIAL FIBRILLATION ABLATION N/A 01/12/2014   Procedure: ATRIAL FIBRILLATION ABLATION;  Surgeon: Coralyn Mark, MD;  Location: [facility];  Service: Cardiovascular;  Laterality: N/A;  . CARDIAC CATHETERIZATION  2007  . CORONARY ANGIOPLASTY WITH STENT PLACEMENT  1996   residual 50% LAD and RCA by cath in 2007  . CORONARY ANGIOPLASTY WITH STENT PLACEMENT  1995  . INGUINAL HERNIA REPAIR Right 1963  . LEFT HEART CATH AND CORONARY ANGIOGRAPHY N/A 10/11/2017   Procedure: LEFT HEART CATH AND CORONARY ANGIOGRAPHY;  Surgeon: [name], MD;  Location: [facility];  Service: Cardiovascular;  Laterality: N/A;  . TEE WITHOUT CARDIOVERSION N/A 01/11/2014   Procedure: TRANSESOPHAGEAL ECHOCARDIOGRAM (TEE);  Surgeon: [name], MD;  Location: [facility];  Service: Cardiovascular;  Laterality: N/A;  . TONSILLECTOMY AND ADENOIDECTOMY  1979    Current Medications: Current Meds  Medication Sig  . acetaminophen (TYLENOL) 500 MG tablet Take 500 mg by mouth every 6 (six) hours as needed for moderate pain or  headache.  . ADVAIR DISKUS 100-50 MCG/DOSE AEPB INHALE 1 PUFF INTO THE LUNGS TWICE DAILY  . atorvastatin (LIPITOR) 40 MG tablet Take 1 tablet (40 mg total) by mouth daily at 6 PM.  . diltiazem (CARDIZEM CD) 360 MG 24 hr capsule Take 1 capsule (360 mg total) by mouth daily.  . fish oil-omega-3 fatty acids 1000 MG capsule Take 1 g by mouth 2 (two) times daily.   [name] LYRICA 100 MG capsule TAKE 1 CAPSULE BY MOUTH EVERY MORNING, 1 CAPSULE AT NOON, AND 2 CAPSULES EVERY NIGHT AT BEDTIME (Patient taking differently: TAKE 100 MG BY MOUTH EVERY MORNING, 100 MG AT NOON, AND 200 MG EVERY NIGHT AT BEDTIME)  . metFORMIN (GLUCOPHAGE-XR) 750 MG 24 hr tablet TAKE 1 TABLET BY MOUTH TWICE DAILY  . Multiple Vitamins-Minerals (CENTRUM SILVER 50+MEN PO) Take 1 tablet by mouth  daily.  . nitroGLYCERIN (NITROSTAT) 0.4 MG SL tablet Place 0.4 mg under the tongue every 5 (five) minutes x 3 doses as needed for chest pain (Call 911 at 3rd dose within 15 minutes).  [name] DELICA LANCETS FINE MISC USE TO CHECK BLOOD SUGAR ONCE DAILY  . ONETOUCH VERIO test strip USE TO CHECK BLOOD SUGAR ONCE DAILY  . PROAIR HFA 108 (90 Base) MCG/ACT inhaler INHALE 2 PUFFS INTO THE LUNGS EVERY 2 HOURS AS NEEDED FOR WHEEZING OR SHORTNESS OF BREATH  . sotalol (BETAPACE) 80 MG tablet TAKE 1 TABLET(80 MG) BY MOUTH TWICE DAILY  . XARELTO 20 MG TABS tablet TAKE 1 TABLET(20 MG) BY MOUTH DAILY WITH SUPPER     Allergies:   Cephalosporins; Penicillins; Thallium; Codeine; Gabapentin; and Eliquis [apixaban]   Social History   Socioeconomic History  . Marital status: Married    Spouse name: Not on file  . Number of children: Not on file  . Years of education: Not on file  . Highest education level: Not on file  Occupational History  . Not on file  Social Needs  . Financial resource strain: Not on file  . Food insecurity:    Worry: Not on file    Inability: Not on file  . Transportation needs:    Medical: Not on file    Non-medical: Not on file  Tobacco Use  . Smoking status: Current Every Day Smoker    Packs/day: 1.00    Years: 43.00    Pack years: 43.00    Types: Cigarettes  . Smokeless tobacco: Never Used  Substance and Sexual Activity  . Alcohol use: Yes    Alcohol/week: 0.0 oz    Comment: 10/27/2013 "might have 1 beer/month"  . Drug use: No  . Sexual activity: Yes  Lifestyle  . Physical activity:    Days per week: Not on file    Minutes per session: Not on file  . Stress: Not on file  Relationships  . Social connections:    Talks on phone: Not on file    Gets together: Not on file    Attends religious service: Not on file    Active member of club or organization: Not on file    Attends meetings of clubs or organizations: Not on file    Relationship status: Not on file    Other Topics Concern  . 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 There is no history of Stroke.  ROS:   Please see the history of present illness.    ROS  All other systems reviewed and negative.   EKGs/Labs/Other Studies Reviewed:    The following studies were reviewed today: PAP download  EKG:  EKG is not ordered today.   Recent Labs: 04/09/2017: ALT 27 08/15/2017: TSH 2.363 10/04/2017: BUN 13; Creatinine, Ser 0.87; Hemoglobin 15.3; Platelets 254; Potassium 4.2; Sodium 137   Recent Lipid Panel    Component Value Date/Time   CHOL 98 08/15/2017 0419   TRIG 318 (H) 08/15/2017 0419   HDL 29 (L) 08/15/2017 0419   CHOLHDL 3.4 08/15/2017 0419   VLDL 64 (H) 08/15/2017 0419   LDLCALC 5 08/15/2017 0419   LDLDIRECT 45.0 04/09/2017 1100    Physical Exam:    VS:  BP 136/66   Pulse 83   Ht 6\' 2"  (1.88 m)   Wt 258 lb (117 kg)   SpO2 96%   BMI 33.13 kg/m     Wt Readings from Last 3 Encounters:  01/07/18 258 lb (117 kg)  10/28/17 252 lb (114.3 kg)  10/11/17 247 lb (112 kg)     GEN:  Well nourished, well developed in no acute distress HEENT: Normal NECK: No JVD; No carotid bruits LYMPHATICS: No lymphadenopathy CARDIAC: RRR, no murmurs, rubs, gallops RESPIRATORY:  Clear to auscultation without rales, wheezing or rhonchi  ABDOMEN: Soft, non-tender, non-distended MUSCULOSKELETAL:  No edema; No deformity  SKIN: Warm and dry NEUROLOGIC:  Alert and oriented x 3 PSYCHIATRIC:  Normal affect   ASSESSMENT:    1. Obstructive sleep apnea   2. Essential hypertension   3. Obesity (BMI 30-39.9)    PLAN:    In order of problems listed above:  1.  OSA - the patient is  tolerating PAP therapy well without any problems. The PAP download was reviewed today and showed an AHI of 1.3/hr on 14/8 cm H2O with 98% compliance in using more than 4 hours nightly.  The patient has been using and benefiting from PAP use and will continue to benefit from therapy.  He would like to get a new device as he says his is more than 61 years old and he feels like the humidity is not working like it should.  We will order him a new BiPAP machine.  He will need to have a repeat follow-up in 10 weeks with me to document compliance.  2.  Hypertension -blood pressures well controlled on exam today.  He will continue on Cardizem CD 360 mg daily.  3.  Obesity - I have encouraged him to get into a routine exercise program and cut back on carbs and portions.   Medication Adjustments/Labs and Tests Ordered: Current medicines are reviewed at length with the patient today.  Concerns regarding medicines are outlined above.  No orders of the defined types were placed in this encounter.  No orders of the defined types were placed in this encounter.   Signed, Fransico Him, MD  01/07/2018 2:57 PM    [facility]

## 2018-01-07 NOTE — Patient Instructions (Addendum)
Medication Instructions:  Your physician recommends that you continue on your current medications as directed. Please refer to the Current Medication list given to you today.  Labwork: None Ordered   Testing/Procedures: None Ordered   Follow-Up: Your physician recommends that you schedule a follow-up appointment in: 10 weeks with Dr. Radford Pax    Any Other Special Instructions Will Be Listed Below (If Applicable).  BIPAP orders have been placed. You will receive a call from the home health agency regarding setting up equipment. If you do not receive a call within the next week give Gae Bon, CPAP assistant a call at [phone].   Thank you for choosing [facility], RN  [phone]    If you need a refill on your cardiac medications before your next appointment, please call your pharmacy.

## 2018-01-11 ENCOUNTER — Telehealth: Payer: Self-pay | Admitting: Family Medicine

## 2018-01-11 NOTE — Telephone Encounter (Unsigned)
Copied from [place] [id number]. Topic: Quick Communication - Rx Refill/Question >> Jan 11, 2018  1:05 PM Neva Seat wrote: [facility] HFA 108 [phone] Base) MCG/ACT inhaler  Needing refills asap  [facility] [address], [name] [place] [address] Phone: [phone] Fax: [phone]

## 2018-01-12 ENCOUNTER — Other Ambulatory Visit: Payer: Self-pay | Admitting: *Deleted

## 2018-01-12 MED ORDER — ALBUTEROL SULFATE HFA 108 (90 BASE) MCG/ACT IN AERS: INHALATION_SPRAY | RESPIRATORY_TRACT | 0 refills | Status: DC

## 2018-01-14 DIAGNOSIS — G4733 Obstructive sleep apnea (adult) (pediatric): Secondary | ICD-10-CM | POA: Diagnosis not present

## 2018-01-18 ENCOUNTER — Other Ambulatory Visit: Payer: Self-pay | Admitting: Interventional Cardiology

## 2018-01-21 ENCOUNTER — Telehealth: Payer: Self-pay | Admitting: *Deleted

## 2018-01-21 NOTE — Telephone Encounter (Signed)
Replacement Bipap received 01/14/18. Patient has a 10 week follow up appointment scheduled for 8/22 2019 at 3:20. Patient understands he needs to keep this appointment for insurance compliance. Patient was grateful for the call and thanked me.

## 2018-02-14 DIAGNOSIS — G4733 Obstructive sleep apnea (adult) (pediatric): Secondary | ICD-10-CM | POA: Diagnosis not present

## 2018-02-16 DIAGNOSIS — G4733 Obstructive sleep apnea (adult) (pediatric): Secondary | ICD-10-CM | POA: Diagnosis not present

## 2018-02-25 NOTE — Telephone Encounter (Signed)
Informed patient of compliance results and patient understanding was verbalized. Patient is aware and agreeable to AHI being within range at 1.3. Patient is aware and agreeable to being in compliance with machine usage Patient is aware and agreeable to no change in current pressures                                                       Patient is aware and agreeable to normal results.

## 2018-02-28 ENCOUNTER — Other Ambulatory Visit: Payer: Self-pay | Admitting: Family Medicine

## 2018-02-28 ENCOUNTER — Encounter: Payer: Self-pay | Admitting: Cardiology

## 2018-02-28 NOTE — Telephone Encounter (Signed)
Rx not seen on med list, ok to fill?

## 2018-02-28 NOTE — Telephone Encounter (Signed)
The requested medication is basically the same as Advair (which is on list).  OK to refill for 6 months.

## 2018-03-16 DIAGNOSIS — G4733 Obstructive sleep apnea (adult) (pediatric): Secondary | ICD-10-CM | POA: Diagnosis not present

## 2018-03-21 ENCOUNTER — Encounter: Payer: Self-pay | Admitting: Cardiology

## 2018-03-23 NOTE — Progress Notes (Signed)
Cardiology Office Note:    Date:  03/24/2018   ID:  Eric Palmer, DOB [birth year], MRN [id number]  PCP:  Eric Post, MD  Cardiologist:  Eric Grooms, MD    Referring MD: Eric Post, MD   Chief Complaint  Patient presents with  . Sleep Apnea  . Hypertension    History of Present Illness:    FREDDERICK Palmer is a 62 y.o. male with a hx of  ASCAD, PAF, dyslipidemia and a history of OSA who is here for followup of OSA. He was diagnosed with OSA years ago and has been on PAP.  I saw him back in May at which time his download showed an AHI of 1.3/h on BiPAP at 14/8 cm H2O with 90% compliance.  He wanted to get a new device which was ordered and he is now back for his 10-week compliance check.  He is doing well with his PAP device and thinks that he has gotten used to it.  He tolerates the mask and feels the pressure is adequate.  Since going on his new PAP, device he feels rested in the am and has no significant daytime sleepiness.  He denies any significant mouth or nasal dryness or nasal congestion.  He does not think that he snores.      Past Medical History:  Diagnosis Date  . Anal fissure   . Chronic bronchitis ([place])    "get it q spring and fall" (10/27/2013)  . Coronary artery disease    a. s/p PCI in 1996. b. LHC 01/2006 showed 50% mid LAD, otherwise widely patent cors with minimal irregularities. c. Nuclear stress test in 04/2009 was reportedly low risk, no ischemia. // d. Nuclear stress test 2/19: EF 55, transient ST depression in recovery, no ischemia, inf defect (artifact vs scar)   . DM type 2, uncontrolled, with neuropathy ([place])   . ED (erectile dysfunction)   . Essential hypertension   . Hx of echocardiogram    Echo (10/2013): Mild LVH, moderate focal basal hypertrophy of the septum, EF 50-55%, no RWMA  . Hyperlipidemia   . Internal hemorrhoids without mention of complication   . Obesity   . Obesity   . OSA on CPAP   . Paresthesia of both legs   .  Paroxysmal atrial fibrillation ([name])    a. failed Tikosyn, Multaq. b. s/p ablation of AF/AFL in 12/2013.  Eric Palmer Paroxysmal atrial flutter ([place])    a. s/p ablation of AF/AFL in 12/2013.  Eric Palmer Smoker    QUIT 10/13/13    Past Surgical History:  Procedure Laterality Date  . ABLATION  01-12-2014   PVI and CTI by Dr [name]  . ANAL FISSURE REPAIR  ~ 2010  . ATRIAL FIBRILLATION ABLATION N/A 01/12/2014   Procedure: ATRIAL FIBRILLATION ABLATION;  Surgeon: Coralyn Mark, MD;  Location: [facility];  Service: Cardiovascular;  Laterality: N/A;  . CARDIAC CATHETERIZATION  2007  . CORONARY ANGIOPLASTY WITH STENT PLACEMENT  1996   residual 50% LAD and RCA by cath in 2007  . CORONARY ANGIOPLASTY WITH STENT PLACEMENT  1995  . INGUINAL HERNIA REPAIR Right 1963  . LEFT HEART CATH AND CORONARY ANGIOGRAPHY N/A 10/11/2017   Procedure: LEFT HEART CATH AND CORONARY ANGIOGRAPHY;  Surgeon: Eric Crome, MD;  Location: [facility];  Service: Cardiovascular;  Laterality: N/A;  . TEE WITHOUT CARDIOVERSION N/A 01/11/2014   Procedure: TRANSESOPHAGEAL ECHOCARDIOGRAM (TEE);  Surgeon: Eric Headings, MD;  Location:  [facility];  Service: Cardiovascular;  Laterality: N/A;  . TONSILLECTOMY AND ADENOIDECTOMY  1979    Current Medications: Current Meds  Medication Sig  . acetaminophen (TYLENOL) 500 MG tablet Take 500 mg by mouth every 6 (six) hours as needed for moderate pain or headache.  . albuterol (PROAIR HFA) 108 (90 Base) MCG/ACT inhaler INHALE 2 PUFFS INTO THE LUNGS EVERY 2 HOURS AS NEEDED FOR WHEEZING OR SHORTNESS OF BREATH  . atorvastatin (LIPITOR) 40 MG tablet Take 1 tablet (40 mg total) by mouth daily at 6 PM.  . diltiazem (CARDIZEM CD) 360 MG 24 hr capsule TAKE 1 CAPSULE(360 MG) BY MOUTH DAILY  . fish oil-omega-3 fatty acids 1000 MG capsule Take 1 g by mouth 2 (two) times daily.   Eric Palmer LYRICA 100 MG capsule TAKE 1 CAPSULE BY MOUTH EVERY MORNING, 1 CAPSULE AT NOON, AND 2 CAPSULES EVERY NIGHT AT BEDTIME (Patient  taking differently: TAKE 100 MG BY MOUTH EVERY MORNING, 100 MG AT NOON, AND 200 MG EVERY NIGHT AT BEDTIME)  . metFORMIN (GLUCOPHAGE-XR) 750 MG 24 hr tablet TAKE 1 TABLET BY MOUTH TWICE DAILY  . nitroGLYCERIN (NITROSTAT) 0.4 MG SL tablet Place 0.4 mg under the tongue every 5 (five) minutes x 3 doses as needed for chest pain (Call 911 at 3rd dose within 15 minutes).  Eric Palmer DELICA LANCETS FINE MISC USE TO CHECK BLOOD SUGAR ONCE DAILY  . ONETOUCH VERIO test strip USE TO CHECK BLOOD SUGAR ONCE DAILY  . sotalol (BETAPACE) 80 MG tablet TAKE 1 TABLET(80 MG) BY MOUTH TWICE DAILY  . WIXELA INHUB 100-50 MCG/DOSE AEPB INHALE 1 PUFF INTO THE LUNGS TWICE DAILY  . XARELTO 20 MG TABS tablet TAKE 1 TABLET(20 MG) BY MOUTH DAILY WITH SUPPER     Allergies:   Cephalosporins; Penicillins; Thallium; Codeine; Gabapentin; and Eliquis [apixaban]   Social History   Socioeconomic History  . Marital status: Married    Spouse name: Not on file  . Number of children: Not on file  . Years of education: Not on file  . Highest education level: Not on file  Occupational History  . Not on file  Social Needs  . Financial resource strain: Not on file  . Food insecurity:    Worry: Not on file    Inability: Not on file  . Transportation needs:    Medical: Not on file    Non-medical: Not on file  Tobacco Use  . Smoking status: Current Every Day Smoker    Packs/day: 1.00    Years: 43.00    Pack years: 43.00    Types: Cigarettes  . Smokeless tobacco: Never Used  Substance and Sexual Activity  . Alcohol use: Yes    Alcohol/week: 0.0 oz    Comment: 10/27/2013 "might have 1 beer/month"  . Drug use: No  . Sexual activity: Yes  Lifestyle  . Physical activity:    Days per week: Not on file    Minutes per session: Not on file  . Stress: Not on file  Relationships  . Social connections:    Talks on phone: Not on file    Gets together: Not on file    Attends religious service: Not on file    Active member of  club or organization: Not on file    Attends meetings of clubs or organizations: Not on file    Relationship status: Not on file  Other Topics Concern  . Not on file  Social History Narrative   Works as an  executive for [employer]     Family History: The patient's family history includes Heart attack in his father; Heart disease in his other; Heart disease (age of onset: 34) in his father; Heart disease (age of onset: 32) in his mother; Hyperlipidemia in his other; Hypertension in his mother. There is no history of Stroke.  ROS:   Please see the history of present illness.    ROS  All other systems reviewed and negative.   EKGs/Labs/Other Studies Reviewed:    The following studies were reviewed today: PAP download  EKG:  EKG is not ordered today.   Recent Labs: 04/09/2017: ALT 27 08/15/2017: TSH 2.363 10/04/2017: BUN 13; Creatinine, Ser 0.87; Hemoglobin 15.3; Platelets 254; Potassium 4.2; Sodium 137   Recent Lipid Panel    Component Value Date/Time   CHOL 98 08/15/2017 0419   TRIG 318 (H) 08/15/2017 0419   HDL 29 (L) 08/15/2017 0419   CHOLHDL 3.4 08/15/2017 0419   VLDL 64 (H) 08/15/2017 0419   LDLCALC 5 08/15/2017 0419   LDLDIRECT 45.0 04/09/2017 1100    Physical Exam:    VS:  BP 118/70 (BP Location: Right Arm, Patient Position: Sitting, Cuff Size: Normal)   Pulse 81   Ht 6\' 2"  (1.88 m)   Wt 248 lb (112.5 kg)   SpO2 94%   BMI 31.84 kg/m     Wt Readings from Last 3 Encounters:  03/24/18 248 lb (112.5 kg)  01/07/18 258 lb (117 kg)  10/28/17 252 lb (114.3 kg)     GEN:  Well nourished, well developed in no acute distress HEENT: Normal NECK: No JVD; No carotid bruits LYMPHATICS: No lymphadenopathy CARDIAC: RRR, no murmurs, rubs, gallops RESPIRATORY:  Clear to auscultation without rales, wheezing or rhonchi  ABDOMEN: Soft, non-tender, non-distended MUSCULOSKELETAL:  No edema; No deformity  SKIN: Warm and dry NEUROLOGIC:  Alert and oriented x 3 PSYCHIATRIC:   Normal affect   ASSESSMENT:    1. Obstructive sleep apnea   2. Essential hypertension   3. Obesity (BMI 30-39.9)    PLAN:    In order of problems listed above:  1.  OSA - the patient is tolerating PAP therapy well without any problems. The PAP download was reviewed today and showed an AHI of 1.7/hr on 14/8 cm H2O with 100% compliance in using more than 4 hours nightly.  The patient has been using and benefiting from PAP use and will continue to benefit from therapy.   2.  HTN - BP is controlled on exam.  He will continue on Cardizem CD 360 mg daily  3.  Obesity - I have encouraged him to get into a routine exercise program and cut back on carbs and portions.    Medication Adjustments/Labs and Tests Ordered: Current medicines are reviewed at length with the patient today.  Concerns regarding medicines are outlined above.  No orders of the defined types were placed in this encounter.  No orders of the defined types were placed in this encounter.   Signed, Fransico Him, MD  03/24/2018 12:05 PM    [place]

## 2018-03-24 ENCOUNTER — Encounter: Payer: Self-pay | Admitting: Cardiology

## 2018-03-24 ENCOUNTER — Ambulatory Visit: Payer: 59 | Admitting: Cardiology

## 2018-03-24 VITALS — BP 118/70 | HR 81 | Ht 74.0 in | Wt 248.0 lb

## 2018-03-24 DIAGNOSIS — E669 Obesity, unspecified: Secondary | ICD-10-CM

## 2018-03-24 DIAGNOSIS — I10 Essential (primary) hypertension: Secondary | ICD-10-CM | POA: Diagnosis not present

## 2018-03-24 DIAGNOSIS — G4733 Obstructive sleep apnea (adult) (pediatric): Secondary | ICD-10-CM | POA: Diagnosis not present

## 2018-03-24 NOTE — Patient Instructions (Signed)
Your physician recommends that you continue on your current medications as directed. Please refer to the Current Medication list given to you today.  Your physician wants you to follow-up in: 1 year with Dr. Turner. You will receive a reminder letter in the mail two months in advance. If you don't receive a letter, please call our office to schedule the follow-up appointment.  

## 2018-03-31 ENCOUNTER — Telehealth: Payer: Self-pay | Admitting: *Deleted

## 2018-03-31 NOTE — Telephone Encounter (Signed)
-----   Message from Sueanne Margarita, MD sent at 03/25/2018  4:46 PM EDT ----- Good AHI and compliance.  Continue current PAP settings.

## 2018-03-31 NOTE — Telephone Encounter (Signed)
Informed patient of compliance results and verbalized understanding was indicated. Patient is aware and agreeable to AHI being within range at 1.6. Patient is aware and agreeable to being in compliance with machine usage. Patient is aware and agreeable to no change in current pressures.

## 2018-04-10 ENCOUNTER — Other Ambulatory Visit: Payer: Self-pay | Admitting: Family Medicine

## 2018-04-11 MED ORDER — ALBUTEROL SULFATE HFA 108 (90 BASE) MCG/ACT IN AERS: INHALATION_SPRAY | RESPIRATORY_TRACT | 0 refills | Status: DC

## 2018-04-11 NOTE — Progress Notes (Signed)
Cardiology Office Note:    Date:  04/12/2018   ID:  [name], DOB 1955-12-25, MRN [id number]  PCP:  [name], MD  Cardiologist:  [name], MD   Referring MD: [name], MD   Chief Complaint  Palmer presents with  . Coronary Artery Disease  . Palmer Education    Erectile dysfunction    History of Present Illness:    Eric Palmer is a 62 y.o. male with a hx of history of coronaryartery disease, remote right coronary stent in 1995, moderate mid LAD stenosis in 2007 by cath, and recent low risk myocardial perfusion study. Intercurrent diagnoses of diabetes mellitus, known history of hypertension, obesity, paroxysmal atrial fibrillation on sotalol therapy, and poorly controlled hyperlipidemia. Also history of obstructive sleep apnea.  Significant stress because his son is opioid dependent.  He becomes tearful when he talks about his son and the predicament that he finds himself and near retirement with a 43 year old son who is financially dependent upon he and his wife.  No angina or episodes of atrial fibrillation.  Is learning to divorce himself from external stressors that precipitated both atrial fibrillation and angina.  No nitroglycerin use.  He denies having any episode of atrial fibrillation recently.  He has not needed to use nitroglycerin.  Past Medical History:  Diagnosis Date  . Anal fissure   . Chronic bronchitis ([place])    "get it q spring and fall" (10/27/2013)  . Coronary artery disease    a. s/p PCI in 1996. b. LHC 01/2006 showed 50% mid LAD, otherwise widely patent cors with minimal irregularities. c. Nuclear stress test in 04/2009 was reportedly low risk, no ischemia. // d. Nuclear stress test 2/19: EF 55, transient ST depression in recovery, no ischemia, inf defect (artifact vs scar)   . DM type 2, uncontrolled, with neuropathy ([place])   . ED (erectile dysfunction)   . Essential hypertension   . Hx of echocardiogram    Echo  (10/2013): Mild LVH, moderate focal basal hypertrophy of the septum, EF 50-55%, no RWMA  . Hyperlipidemia   . Internal hemorrhoids without mention of complication   . Obesity   . Obesity   . OSA on CPAP   . Paresthesia of both legs   . Paroxysmal atrial fibrillation ([name])    a. failed Tikosyn, Multaq. b. s/p ablation of AF/AFL in 12/2013.  [name] Paroxysmal atrial flutter ([place])    a. s/p ablation of AF/AFL in 12/2013.  [name] Smoker    QUIT 10/13/13    Past Surgical History:  Procedure Laterality Date  . ABLATION  01-12-2014   PVI and CTI by Dr [name]  . ANAL FISSURE REPAIR  ~ 2010  . ATRIAL FIBRILLATION ABLATION N/A 01/12/2014   Procedure: ATRIAL FIBRILLATION ABLATION;  Surgeon: Coralyn Mark, MD;  Location: [facility];  Service: Cardiovascular;  Laterality: N/A;  . CARDIAC CATHETERIZATION  2007  . CORONARY ANGIOPLASTY WITH STENT PLACEMENT  1996   residual 50% LAD and RCA by cath in 2007  . CORONARY ANGIOPLASTY WITH STENT PLACEMENT  1995  . INGUINAL HERNIA REPAIR Right 1963  . LEFT HEART CATH AND CORONARY ANGIOGRAPHY N/A 10/11/2017   Procedure: LEFT HEART CATH AND CORONARY ANGIOGRAPHY;  Surgeon: [name], MD;  Location: [facility];  Service: Cardiovascular;  Laterality: N/A;  . TEE WITHOUT CARDIOVERSION N/A 01/11/2014   Procedure: TRANSESOPHAGEAL ECHOCARDIOGRAM (TEE);  Surgeon: [name], MD;  Location: [facility];  Service: Cardiovascular;  Laterality: N/A;  . TONSILLECTOMY AND ADENOIDECTOMY  1979    Current Medications: Current Meds  Medication Sig  . acetaminophen (TYLENOL) 500 MG tablet Take 500 mg by mouth every 6 (six) hours as needed for moderate pain or headache.  . albuterol (PROAIR HFA) 108 (90 Base) MCG/ACT inhaler INHALE 2 PUFFS INTO THE LUNGS EVERY 2 HOURS AS NEEDED FOR WHEEZING OR SHORTNESS OF BREATH  . atorvastatin (LIPITOR) 40 MG tablet Take 1 tablet (40 mg total) by mouth daily at 6 PM.  . diltiazem (CARDIZEM CD) 360 MG 24 hr capsule Take 1 capsule (360  mg total) by mouth daily.  . fish oil-omega-3 fatty acids 1000 MG capsule Take 1 g by mouth 2 (two) times daily.   [name] LYRICA 100 MG capsule TAKE 1 CAPSULE BY MOUTH EVERY MORNING, 1 CAPSULE AT NOON, AND 2 CAPSULES EVERY NIGHT AT BEDTIME (Palmer taking differently: TAKE 100 MG BY MOUTH EVERY MORNING, 100 MG AT NOON, AND 200 MG EVERY NIGHT AT BEDTIME)  . metFORMIN (GLUCOPHAGE-XR) 750 MG 24 hr tablet TAKE 1 TABLET BY MOUTH TWICE DAILY  . nitroGLYCERIN (NITROSTAT) 0.4 MG SL tablet Place 1 tablet (0.4 mg total) under the tongue every 5 (five) minutes x 3 doses as needed for chest pain (Call 911 if 3rd dose is taken).  [name] DELICA LANCETS FINE MISC USE TO CHECK BLOOD SUGAR ONCE DAILY  . ONETOUCH VERIO test strip USE TO CHECK BLOOD SUGAR ONCE DAILY  . sotalol (BETAPACE) 80 MG tablet Take 1 tablet (80 mg total) by mouth 2 (two) times daily.  [name] INHUB 100-50 MCG/DOSE AEPB INHALE 1 PUFF INTO THE LUNGS TWICE DAILY  . XARELTO 20 MG TABS tablet TAKE 1 TABLET(20 MG) BY MOUTH DAILY WITH SUPPER  . [DISCONTINUED] atorvastatin (LIPITOR) 40 MG tablet Take 1 tablet (40 mg total) by mouth daily at 6 PM.  . [DISCONTINUED] diltiazem (CARDIZEM CD) 360 MG 24 hr capsule TAKE 1 CAPSULE(360 MG) BY MOUTH DAILY  . [DISCONTINUED] nitroGLYCERIN (NITROSTAT) 0.4 MG SL tablet Place 0.4 mg under the tongue every 5 (five) minutes x 3 doses as needed for chest pain (Call 911 at 3rd dose within 15 minutes).  . [DISCONTINUED] nitroGLYCERIN (NITROSTAT) 0.4 MG SL tablet Place 1 tablet (0.4 mg total) under the tongue every 5 (five) minutes x 3 doses as needed for chest pain (Call 911 at 3rd dose within 15 minutes).  . [DISCONTINUED] sotalol (BETAPACE) 80 MG tablet TAKE 1 TABLET(80 MG) BY MOUTH TWICE DAILY     Allergies:   Cephalosporins; Penicillins; Thallium; Codeine; Gabapentin; and Eliquis [apixaban]   Social History   Socioeconomic History  . Marital status: Married    Spouse name: Not on file  . Number of children: Not  on file  . Years of education: Not on file  . Highest education level: Not on file  Occupational History  . Not on file  Social Needs  . Financial resource strain: Not on file  . Food insecurity:    Worry: Not on file    Inability: Not on file  . Transportation needs:    Medical: Not on file    Non-medical: Not on file  Tobacco Use  . Smoking status: Current Every Day Smoker    Packs/day: 1.00    Years: 43.00    Pack years: 43.00    Types: Cigarettes  . Smokeless tobacco: Never Used  Substance and Sexual Activity  . Alcohol use: Yes    Alcohol/week: 0.0  standard drinks    Comment: 10/27/2013 "might have 1 beer/month"  . Drug use: No  . Sexual activity: Yes  Lifestyle  . Physical activity:    Days per week: Not on file    Minutes per session: Not on file  . Stress: Not on file  Relationships  . Social connections:    Talks on phone: Not on file    Gets together: Not on file    Attends religious service: Not on file    Active member of club or organization: Not on file    Attends meetings of clubs or organizations: Not on file    Relationship status: Not on file  Other Topics Concern  . Not on file  Social History Narrative   Works as an Programme researcher, broadcasting/film/video for [employer] History: The Palmer's family history includes Heart attack in his father; Heart disease in his other; Heart disease (age of onset: 77) in his father; Heart disease (age of onset: 57) in his mother; Hyperlipidemia in his other; Hypertension in his mother. There is no history of Stroke.  ROS:   Please see the history of present illness.    Snoring, depression, sadness related to his son's opiate addiction.  All other systems reviewed and are negative.  EKGs/Labs/Other Studies Reviewed:    The following studies were reviewed today: Data  EKG:  EKG is not ordered today.    Recent Labs: 08/15/2017: TSH 2.363 10/04/2017: BUN 13; Creatinine, Ser 0.87; Hemoglobin 15.3; Platelets 254; Potassium 4.2;  Sodium 137  Recent Lipid Panel    Component Value Date/Time   CHOL 98 08/15/2017 0419   TRIG 318 (H) 08/15/2017 0419   HDL 29 (L) 08/15/2017 0419   CHOLHDL 3.4 08/15/2017 0419   VLDL 64 (H) 08/15/2017 0419   LDLCALC 5 08/15/2017 0419   LDLDIRECT 45.0 04/09/2017 1100    Physical Exam:    VS:  BP 132/70   Pulse 100   Ht 6\' 1"  (1.854 m)   Wt 247 lb 6.4 oz (112.2 kg)   BMI 32.64 kg/m     Wt Readings from Last 3 Encounters:  04/12/18 247 lb 6.4 oz (112.2 kg)  03/24/18 248 lb (112.5 kg)  01/07/18 258 lb (117 kg)     GEN:  Well nourished, well developed in no acute distress HEENT: Normal NECK: No JVD. LYMPHATICS: No lymphadenopathy CARDIAC: RRR, no murmur, no gallop, no edema. VASCULAR: 2+ bilateral radial pulses.  No bruits. RESPIRATORY:  Clear to auscultation without rales, wheezing or rhonchi  ABDOMEN: Soft, non-tender, non-distended, No pulsatile mass, MUSCULOSKELETAL: No deformity  SKIN: Warm and dry NEUROLOGIC:  Alert and oriented x 3 PSYCHIATRIC:  Normal affect   ASSESSMENT:    1. Coronary artery disease involving native coronary artery of native heart with angina pectoris ([place])   2. Essential hypertension   3. Other hyperlipidemia   4. Erectile dysfunction, unspecified erectile dysfunction type   5. Chronic anticoagulation   6. Obstructive sleep apnea   7. Type 2 diabetes mellitus with diabetic polyneuropathy, without long-term current use of insulin ([place])   8. Tobacco use disorder    PLAN:    In order of problems listed above:  1. Stable without angina pectoris.  Encouraged aerobic activity.  No functional testing required at this time. 2. Target blood pressure 130/80 mmHg.  Low-salt diet, weight loss, and moderate aerobic activity up to 150 minutes/week is recommended. 3. Target LDL less than 70 on statin therapy. 4. We discussed  erectile dysfunction.  He is not actively using nitroglycerin.  Prescription for Cialis is given.  Knows he cannot take  nitroglycerin if he is used Cialis within 48 to 72 hours. 5. Monitor anticoagulation in Coumadin clinic.  Being evaluated today. 6. Compliance recommended.  Feels much better now that he has no equipment.  Generally, I encourage aerobic activity, weight loss, and clinical follow-up in 6 months.   Medication Adjustments/Labs and Tests Ordered: Current medicines are reviewed at length with the Palmer today.  Concerns regarding medicines are outlined above.  No orders of the defined types were placed in this encounter.  Meds ordered this encounter  Medications  . atorvastatin (LIPITOR) 40 MG tablet    Sig: Take 1 tablet (40 mg total) by mouth daily at 6 PM.    Dispense:  90 tablet    Refill:  3  . diltiazem (CARDIZEM CD) 360 MG 24 hr capsule    Sig: Take 1 capsule (360 mg total) by mouth daily.    Dispense:  90 capsule    Refill:  3  . DISCONTD: nitroGLYCERIN (NITROSTAT) 0.4 MG SL tablet    Sig: Place 1 tablet (0.4 mg total) under the tongue every 5 (five) minutes x 3 doses as needed for chest pain (Call 911 at 3rd dose within 15 minutes).    Dispense:  25 tablet    Refill:  3  . sotalol (BETAPACE) 80 MG tablet    Sig: Take 1 tablet (80 mg total) by mouth 2 (two) times daily.    Dispense:  180 tablet    Refill:  3  . tadalafil (CIALIS) 20 MG tablet    Sig: Take 1 tablet (20 mg total) by mouth daily as needed for erectile dysfunction.    Dispense:  10 tablet    Refill:  1    Additional refills will need to come from PCP  . nitroGLYCERIN (NITROSTAT) 0.4 MG SL tablet    Sig: Place 1 tablet (0.4 mg total) under the tongue every 5 (five) minutes x 3 doses as needed for chest pain (Call 911 if 3rd dose is taken).    Dispense:  25 tablet    Refill:  3    Palmer Instructions  Medication Instructions:  1) Ok to use Cialis 20mg  once daily as needed  Labwork: None  Testing/Procedures: None  Follow-Up: Your physician wants you to follow-up in: 9-12 months with Dr. Tamala Julian.  You  will receive a reminder letter in the mail two months in advance. If you don't receive a letter, please call our office to schedule the follow-up appointment.   Any Other Special Instructions Will Be Listed Below (If Applicable).     If you need a refill on your cardiac medications before your next appointment, please call your pharmacy.      Signed, [name], MD  04/12/2018 5:55 PM    [place]

## 2018-04-12 ENCOUNTER — Ambulatory Visit: Payer: 59 | Admitting: Interventional Cardiology

## 2018-04-12 ENCOUNTER — Encounter: Payer: Self-pay | Admitting: Interventional Cardiology

## 2018-04-12 VITALS — BP 132/70 | HR 100 | Ht 73.0 in | Wt 247.4 lb

## 2018-04-12 DIAGNOSIS — E1142 Type 2 diabetes mellitus with diabetic polyneuropathy: Secondary | ICD-10-CM

## 2018-04-12 DIAGNOSIS — I25119 Atherosclerotic heart disease of native coronary artery with unspecified angina pectoris: Secondary | ICD-10-CM | POA: Diagnosis not present

## 2018-04-12 DIAGNOSIS — E7849 Other hyperlipidemia: Secondary | ICD-10-CM | POA: Diagnosis not present

## 2018-04-12 DIAGNOSIS — I10 Essential (primary) hypertension: Secondary | ICD-10-CM

## 2018-04-12 DIAGNOSIS — Z7901 Long term (current) use of anticoagulants: Secondary | ICD-10-CM

## 2018-04-12 DIAGNOSIS — G4733 Obstructive sleep apnea (adult) (pediatric): Secondary | ICD-10-CM

## 2018-04-12 DIAGNOSIS — N529 Male erectile dysfunction, unspecified: Secondary | ICD-10-CM

## 2018-04-12 DIAGNOSIS — F17200 Nicotine dependence, unspecified, uncomplicated: Secondary | ICD-10-CM

## 2018-04-12 MED ORDER — DILTIAZEM HCL ER COATED BEADS 360 MG PO CP24: 360.0000 mg | ORAL_CAPSULE | Freq: Every day | ORAL | 3 refills | Status: DC

## 2018-04-12 MED ORDER — ATORVASTATIN CALCIUM 40 MG PO TABS: 40.0000 mg | ORAL_TABLET | Freq: Every day | ORAL | 3 refills | Status: DC

## 2018-04-12 MED ORDER — NITROGLYCERIN 0.4 MG SL SUBL: 0.4000 mg | SUBLINGUAL_TABLET | SUBLINGUAL | 3 refills | Status: DC | PRN

## 2018-04-12 MED ORDER — TADALAFIL 20 MG PO TABS: 20.0000 mg | ORAL_TABLET | Freq: Every day | ORAL | 1 refills | Status: DC | PRN

## 2018-04-12 MED ORDER — SOTALOL HCL 80 MG PO TABS: 80.0000 mg | ORAL_TABLET | Freq: Two times a day (BID) | ORAL | 3 refills | Status: DC

## 2018-04-12 NOTE — Patient Instructions (Signed)
Medication Instructions:  1) Ok to use Cialis 20mg  once daily as needed  Labwork: None  Testing/Procedures: None  Follow-Up: Your physician wants you to follow-up in: 9-12 months with Dr. Tamala Julian.  You will receive a reminder letter in the mail two months in advance. If you don't receive a letter, please call our office to schedule the follow-up appointment.   Any Other Special Instructions Will Be Listed Below (If Applicable).     If you need a refill on your cardiac medications before your next appointment, please call your pharmacy.

## 2018-04-14 ENCOUNTER — Ambulatory Visit: Payer: 59 | Admitting: Cardiology

## 2018-04-16 DIAGNOSIS — G4733 Obstructive sleep apnea (adult) (pediatric): Secondary | ICD-10-CM | POA: Diagnosis not present

## 2018-04-22 ENCOUNTER — Ambulatory Visit (INDEPENDENT_AMBULATORY_CARE_PROVIDER_SITE_OTHER)
Admission: RE | Admit: 2018-04-22 | Discharge: 2018-04-22 | Disposition: A | Discharge status: Home / Self Care | Payer: 59 | Source: Ambulatory Visit | Attending: Acute Care | Admitting: Acute Care

## 2018-04-22 DIAGNOSIS — Z122 Encounter for screening for malignant neoplasm of respiratory organs: Secondary | ICD-10-CM

## 2018-04-22 DIAGNOSIS — F17210 Nicotine dependence, cigarettes, uncomplicated: Secondary | ICD-10-CM

## 2018-04-25 ENCOUNTER — Other Ambulatory Visit: Payer: Self-pay | Admitting: Family Medicine

## 2018-04-26 ENCOUNTER — Other Ambulatory Visit: Payer: Self-pay | Admitting: Acute Care

## 2018-04-26 ENCOUNTER — Other Ambulatory Visit: Payer: Self-pay | Admitting: Family Medicine

## 2018-04-26 DIAGNOSIS — F17210 Nicotine dependence, cigarettes, uncomplicated: Secondary | ICD-10-CM

## 2018-04-26 DIAGNOSIS — Z122 Encounter for screening for malignant neoplasm of respiratory organs: Secondary | ICD-10-CM

## 2018-05-11 ENCOUNTER — Other Ambulatory Visit: Payer: Self-pay | Admitting: Family Medicine

## 2018-05-11 ENCOUNTER — Other Ambulatory Visit: Payer: Self-pay | Admitting: Interventional Cardiology

## 2018-05-17 DIAGNOSIS — G4733 Obstructive sleep apnea (adult) (pediatric): Secondary | ICD-10-CM | POA: Diagnosis not present

## 2018-05-19 ENCOUNTER — Other Ambulatory Visit: Payer: Self-pay | Admitting: Family Medicine

## 2018-05-26 ENCOUNTER — Other Ambulatory Visit: Payer: Self-pay | Admitting: Family Medicine

## 2018-05-31 DIAGNOSIS — G4733 Obstructive sleep apnea (adult) (pediatric): Secondary | ICD-10-CM | POA: Diagnosis not present

## 2018-06-16 DIAGNOSIS — G4733 Obstructive sleep apnea (adult) (pediatric): Secondary | ICD-10-CM | POA: Diagnosis not present

## 2018-06-20 DIAGNOSIS — H35361 Drusen (degenerative) of macula, right eye: Secondary | ICD-10-CM | POA: Diagnosis not present

## 2018-06-20 DIAGNOSIS — H25013 Cortical age-related cataract, bilateral: Secondary | ICD-10-CM | POA: Diagnosis not present

## 2018-06-20 DIAGNOSIS — E119 Type 2 diabetes mellitus without complications: Secondary | ICD-10-CM | POA: Diagnosis not present

## 2018-06-20 LAB — HM DIABETES EYE EXAM

## 2018-06-24 DIAGNOSIS — G4733 Obstructive sleep apnea (adult) (pediatric): Secondary | ICD-10-CM | POA: Diagnosis not present

## 2018-07-05 ENCOUNTER — Other Ambulatory Visit: Payer: Self-pay | Admitting: Family Medicine

## 2018-07-17 DIAGNOSIS — G4733 Obstructive sleep apnea (adult) (pediatric): Secondary | ICD-10-CM | POA: Diagnosis not present

## 2018-08-02 ENCOUNTER — Other Ambulatory Visit: Payer: Self-pay | Admitting: Family Medicine

## 2018-08-08 ENCOUNTER — Other Ambulatory Visit: Payer: Self-pay | Admitting: Family Medicine

## 2018-08-08 MED ORDER — METFORMIN HCL ER 750 MG PO TB24: 750.0000 mg | ORAL_TABLET | Freq: Two times a day (BID) | ORAL | 0 refills | Status: DC

## 2018-08-08 MED ORDER — ALBUTEROL SULFATE HFA 108 (90 BASE) MCG/ACT IN AERS: INHALATION_SPRAY | RESPIRATORY_TRACT | 1 refills | Status: DC

## 2018-08-08 NOTE — Telephone Encounter (Signed)
Last OV 05/20/17 (2018), Next OV 08/12/2018  Last filled 05/10/17, # [phone] with 5 refills

## 2018-08-08 NOTE — Telephone Encounter (Signed)
Copied from [place] [id number]. Topic: Quick Communication - Rx Refill/Question >> Aug 08, 2018  9:19 AM Burchel, Abbi R wrote: Medication: albuterol (PROVENTIL HFA;VENTOLIN HFA) 108 (90 Base) MCG/ACT inhaler, LYRICA 100 MG capsule, metFORMIN (GLUCOPHAGE-XR) 750 MG 24 hr tablet  Has the patient contacted their pharmacy? Yes.    Pt has med mgmt appt scheduled for Fri 12/20   Preferred Pharmacy: [facility] [place], [address] AT [address]  [phone] (Phone) [phone] (Fax)    Pt was advised that RX refills may take up to 3 business days. We ask that you follow-up with your pharmacy.

## 2018-08-08 NOTE — Telephone Encounter (Signed)
Requested medication (s) are due for refill today: Yes  Requested medication (s) are on the active medication list: Yes  Last refill:  05/10/17  Future visit scheduled: Yes   Notes to clinic:  See request    Requested Prescriptions  Pending Prescriptions Disp Refills   pregabalin (LYRICA) 100 MG capsule 360 capsule 5    Sig: TAKE 1 CAPSULE BY MOUTH EVERY MORNING, 1 CAPSULE AT NOON, AND 2 CAPSULES EVERY NIGHT AT BEDTIME     Not Delegated - Neurology:  Anticonvulsants - Controlled Failed - 08/08/2018 11:13 AM      Failed - This refill cannot be delegated      Failed - Valid encounter within last 12 months    Recent Outpatient Visits          1 year ago Acute recurrent pansinusitis   Therapist, music at [facility], [place], NP   1 year ago Physical exam   Therapist, music at [facility], Alinda Sierras, MD   1 year ago [place] at [facility], [place], DO   2 years ago Type 2 diabetes mellitus with diabetic polyneuropathy, without long-term current use of insulin ([place])   Therapist, music at [facility], Alinda Sierras, MD   2 years ago Sialolithiasis of submandibular [place] at [facility], Alinda Sierras, MD      Future Appointments            In 4 days Burchette, Alinda Sierras, MD [facility] at [place], [facility]         Signed Prescriptions Disp Refills   albuterol (PROVENTIL HFA;VENTOLIN HFA) 108 (90 Base) MCG/ACT inhaler 8.5 g 1    Sig: INHALE 2 PUFFS INTO THE LUNGS EVERY 2 HOURS AS NEEDED FOR WHEEZING OR SHORTNESS OF BREATH     Pulmonology:  Beta Agonists Failed - 08/08/2018 11:13 AM      Failed - One inhaler should last at least one month. If the patient is requesting refills earlier, contact the patient to check for uncontrolled symptoms.      Failed - Valid encounter within last 12 months    Recent Outpatient Visits          1 year ago Acute recurrent pansinusitis   Therapist, music at  [facility], [place], NP   1 year ago Physical exam   Therapist, music at [facility], Alinda Sierras, MD   1 year ago [place] at [facility], [place], DO   2 years ago Type 2 diabetes mellitus with diabetic polyneuropathy, without long-term current use of insulin ([place])   Therapist, music at [facility], Alinda Sierras, MD   2 years ago Sialolithiasis of submandibular [place] at [facility], Alinda Sierras, MD      Future Appointments            In 4 days Burchette, Alinda Sierras, MD [place] at [place], [place]          metFORMIN (GLUCOPHAGE-XR) 750 MG 24 hr tablet 180 tablet 0    Sig: Take 1 tablet (750 mg total) by mouth 2 (two) times daily.     Endocrinology:  Diabetes - Biguanides Failed - 08/08/2018 11:13 AM      Failed - HBA1C is between 0 and 7.9 and within 180 days    Hgb A1c MFr Bld  Date Value Ref Range Status  08/15/2017 7.0 (H) 4.8 - 5.6 % Final    Comment:    (  NOTE) Pre diabetes:          5.7%-6.4% Diabetes:              >6.4% Glycemic control for   <7.0% adults with diabetes          Failed - Valid encounter within last 6 months    Recent Outpatient Visits          1 year ago Acute recurrent pansinusitis   Therapist, music at [facility], [place], NP   1 year ago Physical exam   Therapist, music at [facility], Alinda Sierras, MD   1 year ago [place] [facility], [place], DO   2 years ago Type 2 diabetes mellitus with diabetic polyneuropathy, without long-term current use of insulin ([place])   Therapist, music at [facility], Alinda Sierras, MD   2 years ago Sialolithiasis of submandibular [place] at [facility], Alinda Sierras, MD      Future Appointments            In 4 days Burchette, Alinda Sierras, MD [place] at [place], [place] in normal range and within 360 days    Creat  Date Value Ref  Range Status  08/21/2016 0.89 0.70 - 1.33 mg/dL Final    Comment:      For patients > or = 61 years of age: The upper reference limit for Creatinine is approximately 13% higher for people identified as African-American.      Creatinine, Ser  Date Value Ref Range Status  10/04/2017 0.87 0.76 - 1.27 mg/dL Final         Passed - eGFR in normal range and within 360 days    GFR calc Af Amer  Date Value Ref Range Status  10/04/2017 108 >59 mL/min/1.73 Final   GFR calc non Af Amer  Date Value Ref Range Status  10/04/2017 94 >59 mL/min/1.73 Final   GFR  Date Value Ref Range Status  04/09/2017 104.71 >60.00 mL/min Final

## 2018-08-08 NOTE — Telephone Encounter (Signed)
Refill once until follow up.[name]

## 2018-08-09 MED ORDER — PREGABALIN 100 MG PO CAPS: ORAL_CAPSULE | ORAL | 0 refills | Status: DC

## 2018-08-12 ENCOUNTER — Other Ambulatory Visit: Payer: Self-pay

## 2018-08-12 ENCOUNTER — Encounter: Payer: Self-pay | Admitting: Family Medicine

## 2018-08-12 ENCOUNTER — Ambulatory Visit: Payer: 59 | Admitting: Family Medicine

## 2018-08-12 VITALS — BP 116/78 | HR 79 | Temp 97.9°F | Ht 73.0 in | Wt 245.3 lb

## 2018-08-12 DIAGNOSIS — E7849 Other hyperlipidemia: Secondary | ICD-10-CM

## 2018-08-12 DIAGNOSIS — I25119 Atherosclerotic heart disease of native coronary artery with unspecified angina pectoris: Secondary | ICD-10-CM | POA: Diagnosis not present

## 2018-08-12 DIAGNOSIS — I10 Essential (primary) hypertension: Secondary | ICD-10-CM | POA: Diagnosis not present

## 2018-08-12 DIAGNOSIS — E1142 Type 2 diabetes mellitus with diabetic polyneuropathy: Secondary | ICD-10-CM

## 2018-08-12 LAB — BASIC METABOLIC PANEL
BUN: 15 mg/dL (ref 6–23)
CO2: 29 mEq/L (ref 19–32)
Calcium: 9.9 mg/dL (ref 8.4–10.5)
Chloride: 101 mEq/L (ref 96–112)
Creatinine, Ser: 0.87 mg/dL (ref 0.40–1.50)
GFR: 94.63 mL/min (ref >60.00)
Glucose, Bld: 136 mg/dL — ABNORMAL HIGH (ref 70–99)
Potassium: 4.4 mEq/L (ref 3.5–5.1)
Sodium: 138 mEq/L (ref 135–145)

## 2018-08-12 LAB — LIPID PANEL
Cholesterol: 127 mg/dL (ref 0–200)
HDL: 34.4 mg/dL — ABNORMAL LOW (ref >39.00)
NonHDL: 92.46
Total CHOL/HDL Ratio: 4
Triglycerides: 374 mg/dL — ABNORMAL HIGH (ref 0.0–149.0)
VLDL: 74.8 mg/dL — ABNORMAL HIGH (ref 0.0–40.0)

## 2018-08-12 LAB — CBC WITH DIFFERENTIAL/PLATELET
Basophils Absolute: 0.1 10*3/uL (ref 0.0–0.1)
Basophils Relative: 0.9 % (ref 0.0–3.0)
Eosinophils Absolute: 0.5 10*3/uL (ref 0.0–0.7)
Eosinophils Relative: 5.8 % — ABNORMAL HIGH (ref 0.0–5.0)
HCT: 45.6 % (ref 39.0–52.0)
Hemoglobin: 15.8 g/dL (ref 13.0–17.0)
Lymphocytes Relative: 27.9 % (ref 12.0–46.0)
Lymphs Abs: 2.4 10*3/uL (ref 0.7–4.0)
MCHC: 34.6 g/dL (ref 30.0–36.0)
MCV: 90.4 fl (ref 78.0–100.0)
Monocytes Absolute: 0.8 10*3/uL (ref 0.1–1.0)
Monocytes Relative: 9.8 % (ref 3.0–12.0)
Neutro Abs: 4.8 10*3/uL (ref 1.4–7.7)
Neutrophils Relative %: 55.6 % (ref 43.0–77.0)
Platelets: 267 10*3/uL (ref 150.0–400.0)
RBC: 5.05 Mil/uL (ref 4.22–5.81)
RDW: 12.8 % (ref 11.5–15.5)
WBC: 8.6 10*3/uL (ref 4.0–10.5)

## 2018-08-12 LAB — HEPATIC FUNCTION PANEL
ALT: 27 U/L (ref 0–53)
AST: 15 U/L (ref 0–37)
Albumin: 4.5 g/dL (ref 3.5–5.2)
Alkaline Phosphatase: 61 U/L (ref 39–117)
Bilirubin, Direct: 0.1 mg/dL (ref 0.0–0.3)
Total Bilirubin: 0.6 mg/dL (ref 0.2–1.2)
Total Protein: 7.1 g/dL (ref 6.0–8.3)

## 2018-08-12 LAB — MICROALBUMIN / CREATININE URINE RATIO
Creatinine,U: 109.1 mg/dL
Microalb Creat Ratio: 2.9 mg/g (ref 0.0–30.0)
Microalb, Ur: 3.1 mg/dL — ABNORMAL HIGH (ref 0.0–1.9)

## 2018-08-12 LAB — LDL CHOLESTEROL, DIRECT: Direct LDL: 49 mg/dL

## 2018-08-12 LAB — HEMOGLOBIN A1C: Hgb A1c MFr Bld: 7.3 % — ABNORMAL HIGH (ref 4.6–6.5)

## 2018-08-12 MED ORDER — METFORMIN HCL ER 750 MG PO TB24: 750.0000 mg | ORAL_TABLET | Freq: Two times a day (BID) | ORAL | 3 refills | Status: DC

## 2018-08-12 MED ORDER — PREGABALIN 100 MG PO CAPS: ORAL_CAPSULE | ORAL | 1 refills | Status: DC

## 2018-08-12 MED ORDER — ALBUTEROL SULFATE HFA 108 (90 BASE) MCG/ACT IN AERS: INHALATION_SPRAY | RESPIRATORY_TRACT | 1 refills | Status: DC

## 2018-08-12 NOTE — Patient Instructions (Signed)
Diabetes Mellitus and Foot Care  Foot care is an important part of your health, especially when you have diabetes. Diabetes may cause you to have problems because of poor blood flow (circulation) to your feet and legs, which can cause your skin to:   Become thinner and drier.   Break more easily.   Heal more slowly.   Peel and crack.  You may also have nerve damage (neuropathy) in your legs and feet, causing decreased feeling in them. This means that you may not notice minor injuries to your feet that could lead to more serious problems. Noticing and addressing any potential problems early is the best way to prevent future foot problems.  How to care for your feet  Foot hygiene   Wash your feet daily with warm water and mild soap. Do not use hot water. Then, pat your feet and the areas between your toes until they are completely dry. Do not soak your feet as this can dry your skin.   Trim your toenails straight across. Do not dig under them or around the cuticle. File the edges of your nails with an emery board or nail file.   Apply a moisturizing lotion or petroleum jelly to the skin on your feet and to dry, brittle toenails. Use lotion that does not contain alcohol and is unscented. Do not apply lotion between your toes.  Shoes and socks   Wear clean socks or stockings every day. Make sure they are not too tight. Do not wear knee-high stockings since they may decrease blood flow to your legs.   Wear shoes that fit properly and have enough cushioning. Always look in your shoes before you put them on to be sure there are no objects inside.   To break in new shoes, wear them for just a few hours a day. This prevents injuries on your feet.  Wounds, scrapes, corns, and calluses   Check your feet daily for blisters, cuts, bruises, sores, and redness. If you cannot see the bottom of your feet, use a mirror or ask someone for help.   Do not cut corns or calluses or try to remove them with medicine.   If you  find a minor scrape, cut, or break in the skin on your feet, keep it and the skin around it clean and dry. You may clean these areas with mild soap and water. Do not clean the area with peroxide, alcohol, or iodine.   If you have a wound, scrape, corn, or callus on your foot, look at it several times a day to make sure it is healing and not infected. Check for:  ? Redness, swelling, or pain.  ? Fluid or blood.  ? Warmth.  ? Pus or a bad smell.  General instructions   Do not cross your legs. This may decrease blood flow to your feet.   Do not use heating pads or hot water bottles on your feet. They may burn your skin. If you have lost feeling in your feet or legs, you may not know this is happening until it is too late.   Protect your feet from hot and cold by wearing shoes, such as at the beach or on hot pavement.   Schedule a complete foot exam at least once a year (annually) or more often if you have foot problems. If you have foot problems, report any cuts, sores, or bruises to your health care provider immediately.  Contact a health care provider if:     You have a medical condition that increases your risk of infection and you have any cuts, sores, or bruises on your feet.   You have an injury that is not healing.   You have redness on your legs or feet.   You feel burning or tingling in your legs or feet.   You have pain or cramps in your legs and feet.   Your legs or feet are numb.   Your feet always feel cold.   You have pain around a toenail.  Get help right away if:   You have a wound, scrape, corn, or callus on your foot and:  ? You have pain, swelling, or redness that gets worse.  ? You have fluid or blood coming from the wound, scrape, corn, or callus.  ? Your wound, scrape, corn, or callus feels warm to the touch.  ? You have pus or a bad smell coming from the wound, scrape, corn, or callus.  ? You have a fever.  ? You have a red line going up your leg.  Summary   Check your feet every day  for cuts, sores, red spots, swelling, and blisters.   Moisturize feet and legs daily.   Wear shoes that fit properly and have enough cushioning.   If you have foot problems, report any cuts, sores, or bruises to your health care provider immediately.   Schedule a complete foot exam at least once a year (annually) or more often if you have foot problems.  This information is not intended to replace advice given to you by your health care provider. Make sure you discuss any questions you have with your health care provider.  Document Released: 08/07/2000 Document Revised: 09/22/2017 Document Reviewed: 09/11/2016  Elsevier Interactive Patient Education  2019 Elsevier Inc.

## 2018-08-12 NOTE — Progress Notes (Signed)
Subjective:     Patient ID: Eric Palmer, male   DOB: 17-Nov-1955, 62 y.o.   MRN: [id number]  HPI Patient seen for medical follow-up.  Has not been seen about a year.  He has chronic problems include history of obesity, CAD, atrial fibrillation, hypertension, obstructive sleep apnea, type 2 diabetes, diabetic neuropathy, metabolic syndrome.  He is followed by cardiology.  Main issue is neuropathy symptoms in his feet.  Frequently has some burning sensation.  Is on Lyrica but usually only takes at night.  Is had some sedation and fatigue with daytime use.  No recent falls.  No difficulties with ambulation.  Diabetes has been relatively stable but not checked in a year.  Last A1c 7.0%.  Takes only metformin at this point.  He had eye exam in October.  Needs urine microalbumin screen.  Had flu vaccine through work reportedly back in October.  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  Past Medical History:  Diagnosis Date  . Anal fissure   . Chronic bronchitis ([place])    "get it q spring and fall" (10/27/2013)  . Coronary artery disease    a. s/p PCI in 1996. b. LHC 01/2006 showed 50% mid LAD, otherwise widely patent cors with minimal irregularities. c. Nuclear stress test in 04/2009 was reportedly low risk, no ischemia. // d. Nuclear stress test 2/19: EF 55, transient ST depression in recovery, no ischemia, inf defect (artifact vs scar)   . DM type 2, uncontrolled, with neuropathy ([place])   . ED (erectile dysfunction)   . Essential hypertension   . Hx of echocardiogram    Echo (10/2013): Mild LVH, moderate focal basal hypertrophy of the septum, EF 50-55%, no RWMA  . Hyperlipidemia   . Internal hemorrhoids without mention of complication   . Obesity   . Obesity   . OSA on CPAP   . Paresthesia of both legs   . Paroxysmal atrial fibrillation ([name])    a. failed Tikosyn, Multaq. b. s/p ablation of AF/AFL in 12/2013.   [name] Paroxysmal atrial flutter ([place])    a. s/p ablation of AF/AFL in 12/2013.  [name] Smoker    QUIT 10/13/13   Past Surgical History:  Procedure Laterality Date  . ABLATION  01-12-2014   PVI and CTI by Dr [name]  . ANAL FISSURE REPAIR  ~ 2010  . ATRIAL FIBRILLATION ABLATION N/A 01/12/2014   Procedure: ATRIAL FIBRILLATION ABLATION;  Surgeon: Coralyn Mark, MD;  Location: [facility];  Service: Cardiovascular;  Laterality: N/A;  . CARDIAC CATHETERIZATION  2007  . CORONARY ANGIOPLASTY WITH STENT PLACEMENT  1996   residual 50% LAD and RCA by cath in 2007  . CORONARY ANGIOPLASTY WITH STENT PLACEMENT  1995  . INGUINAL HERNIA REPAIR Right 1963  . LEFT HEART CATH AND CORONARY ANGIOGRAPHY N/A 10/11/2017   Procedure: LEFT HEART CATH AND CORONARY ANGIOGRAPHY;  Surgeon: [name], MD;  Location: [facility];  Service: Cardiovascular;  Laterality: N/A;  . TEE WITHOUT CARDIOVERSION N/A 01/11/2014   Procedure: TRANSESOPHAGEAL ECHOCARDIOGRAM (TEE);  Surgeon: [name], MD;  Location: [facility];  Service: Cardiovascular;  Laterality: N/A;  . [place]    reports that he has been smoking cigarettes. He has a 43.00 pack-year smoking history. He has never used smokeless tobacco. He reports current alcohol use. He reports that he does not use drugs.  family history includes Heart attack in his father; Heart disease in an other family member; Heart disease (age of onset: 30) in his father; Heart disease (age of onset: 30) in his mother; Hyperlipidemia in an other family member; Hypertension in his mother. Allergies  Allergen Reactions  . Cephalosporins Anaphylaxis  . Penicillins Anaphylaxis and Other (See Comments)    Has patient had a PCN reaction causing immediate rash, facial/tongue/throat swelling, SOB or lightheadedness with hypotension: Yes Has patient had a PCN reaction causing severe rash involving mucus membranes or skin necrosis: No Has patient had a PCN  reaction that required hospitalization: No - went to doctors office Has patient had a PCN reaction occurring within the last 10 years: No If all of the above answers are "NO", then may proceed with Cephalosporin use.   . Thallium Itching and Rash  . Codeine Itching  . Gabapentin Other (See Comments)    "made me feel drugged"  . Eliquis [Apixaban] Diarrhea     Review of Systems  Constitutional: Negative for fatigue and unexpected weight change.  Eyes: Negative for visual disturbance.  Respiratory: Negative for cough, chest tightness and shortness of breath.   Cardiovascular: Negative for chest pain, palpitations and leg swelling.  Endocrine: Negative for polydipsia and polyuria.  Neurological: Negative for dizziness, syncope, weakness, light-headedness and headaches.       Objective:   Physical Exam Constitutional:      Appearance: He is well-developed.  HENT:     Right Ear: External ear normal.     Left Ear: External ear normal.  Eyes:     Pupils: Pupils are equal, round, and reactive to light.  Neck:     Musculoskeletal: Neck supple.     Thyroid: No thyromegaly.  Cardiovascular:     Rate and Rhythm: Normal rate and regular rhythm.  Pulmonary:     Effort: Pulmonary effort is normal. No respiratory distress.     Breath sounds: Normal breath sounds. No wheezing or rales.  Skin:    Comments: Feet reveal no skin lesions.  Faint dorsalis pedis and posterior tibial pulses. Good capillary refill. No calluses.  He has impairment with monofilament specially involving the great toe and fifth toe bilaterally.  No skin lesions.   Neurological:     Mental Status: He is alert and oriented to person, place, and time.        Assessment:     #1 type 2 diabetes.  History of borderline control previously  #2 history of CAD.  He had stent placed years ago back in his late 96s.  No recent chest pain  #3 dyslipidemia with goal LDL less than 70  #4 hypertension stable  #5 chronic  neuropathy in both feet related to diabetes.  Treated with Lyrica  #6 history of obstructive sleep apnea followed by cardiology    Plan:     -Follow-up labs including lipid panel, hepatic panel, basic metabolic panel, hemoglobin A1c, urine micro-albumin -Refilled Lyrica, albuterol, and metformin -Discussed foot care with handout given -If diabetes not to goal consider possible addition of SGLT2 given his cardiac history -We recommended more consistent exercise and try to lose some weight and also more consistent follow-up here at least every 6 months  [name] MD [facility]

## 2018-08-15 ENCOUNTER — Other Ambulatory Visit: Payer: Self-pay

## 2018-08-15 MED ORDER — EMPAGLIFLOZIN 10 MG PO TABS: 10.0000 mg | ORAL_TABLET | Freq: Every day | ORAL | 1 refills | Status: DC

## 2018-08-16 DIAGNOSIS — G4733 Obstructive sleep apnea (adult) (pediatric): Secondary | ICD-10-CM | POA: Diagnosis not present

## 2018-09-16 DIAGNOSIS — G4733 Obstructive sleep apnea (adult) (pediatric): Secondary | ICD-10-CM | POA: Diagnosis not present

## 2018-10-08 ENCOUNTER — Other Ambulatory Visit: Payer: Self-pay | Admitting: Interventional Cardiology

## 2018-10-08 ENCOUNTER — Other Ambulatory Visit: Payer: Self-pay | Admitting: Family Medicine

## 2018-10-14 ENCOUNTER — Other Ambulatory Visit: Payer: Self-pay | Admitting: Family Medicine

## 2018-10-15 ENCOUNTER — Other Ambulatory Visit: Payer: Self-pay | Admitting: Family Medicine

## 2018-10-17 DIAGNOSIS — G4733 Obstructive sleep apnea (adult) (pediatric): Secondary | ICD-10-CM | POA: Diagnosis not present

## 2018-10-24 ENCOUNTER — Ambulatory Visit: Payer: 59 | Admitting: Family Medicine

## 2018-11-01 ENCOUNTER — Other Ambulatory Visit: Payer: Self-pay | Admitting: Family Medicine

## 2018-11-09 DIAGNOSIS — G4733 Obstructive sleep apnea (adult) (pediatric): Secondary | ICD-10-CM | POA: Diagnosis not present

## 2018-12-03 ENCOUNTER — Other Ambulatory Visit: Payer: Self-pay | Admitting: Interventional Cardiology

## 2018-12-08 ENCOUNTER — Other Ambulatory Visit: Payer: Self-pay | Admitting: Interventional Cardiology

## 2018-12-08 DIAGNOSIS — I480 Paroxysmal atrial fibrillation: Secondary | ICD-10-CM

## 2018-12-08 NOTE — Telephone Encounter (Signed)
Xarelto 20mg  refill request received; pt is 62 yr sold, wt-111.3kg, Crea-0.87 on 08/12/2018, last seen by Dr. Tamala Julian on 04/12/2018, CrCl-140.80ml/min; refill sent.

## 2018-12-11 ENCOUNTER — Other Ambulatory Visit: Payer: Self-pay | Admitting: Family Medicine

## 2018-12-14 ENCOUNTER — Other Ambulatory Visit: Payer: Self-pay | Admitting: Family Medicine

## 2018-12-16 DIAGNOSIS — G4733 Obstructive sleep apnea (adult) (pediatric): Secondary | ICD-10-CM | POA: Diagnosis not present

## 2019-01-13 DIAGNOSIS — H02834 Dermatochalasis of left upper eyelid: Secondary | ICD-10-CM | POA: Diagnosis not present

## 2019-01-13 DIAGNOSIS — H00025 Hordeolum internum left lower eyelid: Secondary | ICD-10-CM | POA: Diagnosis not present

## 2019-01-13 DIAGNOSIS — H02831 Dermatochalasis of right upper eyelid: Secondary | ICD-10-CM | POA: Diagnosis not present

## 2019-01-16 ENCOUNTER — Other Ambulatory Visit: Payer: Self-pay | Admitting: Family Medicine

## 2019-02-07 ENCOUNTER — Other Ambulatory Visit: Payer: Self-pay | Admitting: Family Medicine

## 2019-02-07 NOTE — Telephone Encounter (Signed)
Needs OV.  

## 2019-02-15 ENCOUNTER — Ambulatory Visit: Payer: 59 | Admitting: Family Medicine

## 2019-02-21 ENCOUNTER — Other Ambulatory Visit: Payer: Self-pay | Admitting: Family Medicine

## 2019-03-08 ENCOUNTER — Other Ambulatory Visit: Payer: Self-pay | Admitting: Family Medicine

## 2019-03-10 ENCOUNTER — Other Ambulatory Visit: Payer: Self-pay | Admitting: Family Medicine

## 2019-03-13 ENCOUNTER — Ambulatory Visit (INDEPENDENT_AMBULATORY_CARE_PROVIDER_SITE_OTHER): Payer: 59 | Admitting: Family Medicine

## 2019-03-13 ENCOUNTER — Other Ambulatory Visit: Payer: Self-pay

## 2019-03-13 DIAGNOSIS — E1142 Type 2 diabetes mellitus with diabetic polyneuropathy: Secondary | ICD-10-CM

## 2019-03-13 DIAGNOSIS — I10 Essential (primary) hypertension: Secondary | ICD-10-CM | POA: Diagnosis not present

## 2019-03-13 DIAGNOSIS — E8881 Metabolic syndrome: Secondary | ICD-10-CM | POA: Diagnosis not present

## 2019-03-13 DIAGNOSIS — I4892 Unspecified atrial flutter: Secondary | ICD-10-CM | POA: Diagnosis not present

## 2019-03-13 MED ORDER — PREGABALIN 100 MG PO CAPS: ORAL_CAPSULE | ORAL | 1 refills | Status: DC

## 2019-03-13 MED ORDER — JARDIANCE 10 MG PO TABS: 10.0000 mg | ORAL_TABLET | Freq: Every day | ORAL | 1 refills | Status: DC

## 2019-03-13 NOTE — Progress Notes (Signed)
Patient ID: Eric Palmer, male   DOB: 12-Jun-1956, 63 y.o.   MRN: [id number]   This visit type was conducted due to national recommendations for restrictions regarding the COVID-19 pandemic in an effort to limit this patient's exposure and mitigate transmission in our community.   Virtual Visit via Telephone Note  I connected with Eric Palmer on 03/13/19 at  3:15 PM EDT by telephone and verified that I am speaking with the correct person using two identifiers.   I discussed the limitations, risks, security and privacy concerns of performing an evaluation and management service by telephone and the availability of in person appointments. I also discussed with the patient that there may be a patient responsible charge related to this service. The patient expressed understanding and agreed to proceed.  Location patient: home Location provider: work or home office Participants present for the call: patient, provider Patient did not have a visit in the prior 7 days to address this/these issue(s).   History of Present Illness: Patient has chronic problems including history of atrial fibrillation, hypertension, CAD, obstructive sleep apnea, type 2 diabetes, diabetic neuropathy, metabolic syndrome, dyslipidemia  He is requesting refills of Lyrica.  He takes regimen of 1 in the morning, 1 at noon, and 2 at night.  This seems to working fairly well for him.  No side effects.  We started Jardiance last December.  Last A1c 7.3%.  He ran out of [place] but never went back.  Denies any side effects.  He states his recent fasting blood sugars been mostly low 100 range.  Past Medical History:  Diagnosis Date  . Anal fissure   . Chronic bronchitis ([place])    "get it q spring and fall" (10/27/2013)  . Coronary artery disease    a. s/p PCI in 1996. b. LHC 01/2006 showed 50% mid LAD, otherwise widely patent cors with minimal irregularities. c. Nuclear stress test in 04/2009 was reportedly low risk, no  ischemia. // d. Nuclear stress test 2/19: EF 55, transient ST depression in recovery, no ischemia, inf defect (artifact vs scar)   . DM type 2, uncontrolled, with neuropathy ([place])   . ED (erectile dysfunction)   . Essential hypertension   . Hx of echocardiogram    Echo (10/2013): Mild LVH, moderate focal basal hypertrophy of the septum, EF 50-55%, no RWMA  . Hyperlipidemia   . Internal hemorrhoids without mention of complication   . Obesity   . Obesity   . OSA on CPAP   . Paresthesia of both legs   . Paroxysmal atrial fibrillation ([name])    a. failed Tikosyn, Multaq. b. s/p ablation of AF/AFL in 12/2013.  [name] Paroxysmal atrial flutter ([place])    a. s/p ablation of AF/AFL in 12/2013.  [name] Smoker    QUIT 10/13/13   Past Surgical History:  Procedure Laterality Date  . ABLATION  01-12-2014   PVI and CTI by Dr [name]  . ANAL FISSURE REPAIR  ~ 2010  . ATRIAL FIBRILLATION ABLATION N/A 01/12/2014   Procedure: ATRIAL FIBRILLATION ABLATION;  Surgeon: Coralyn Mark, MD;  Location: [facility];  Service: Cardiovascular;  Laterality: N/A;  . CARDIAC CATHETERIZATION  2007  . CORONARY ANGIOPLASTY WITH STENT PLACEMENT  1996   residual 50% LAD and RCA by cath in 2007  . CORONARY ANGIOPLASTY WITH STENT PLACEMENT  1995  . INGUINAL HERNIA REPAIR Right 1963  . LEFT HEART CATH AND CORONARY ANGIOGRAPHY N/A 10/11/2017   Procedure: LEFT HEART CATH AND CORONARY ANGIOGRAPHY;  Surgeon: [name], MD;  Location: [facility];  Service: Cardiovascular;  Laterality: N/A;  . TEE WITHOUT CARDIOVERSION N/A 01/11/2014   Procedure: TRANSESOPHAGEAL ECHOCARDIOGRAM (TEE);  Surgeon: [name], MD;  Location: [facility];  Service: Cardiovascular;  Laterality: N/A;  . [place]    reports that he has been smoking cigarettes. He has a 43.00 pack-year smoking history. He has never used smokeless tobacco. He reports current alcohol use. He reports that he does not use drugs. family history  includes Heart attack in his father; Heart disease in an other family member; Heart disease (age of onset: 43) in his father; Heart disease (age of onset: 51) in his mother; Hyperlipidemia in an other family member; Hypertension in his mother. Allergies  Allergen Reactions  . Cephalosporins Anaphylaxis  . Penicillins Anaphylaxis and Other (See Comments)    Has patient had a PCN reaction causing immediate rash, facial/tongue/throat swelling, SOB or lightheadedness with hypotension: Yes Has patient had a PCN reaction causing severe rash involving mucus membranes or skin necrosis: No Has patient had a PCN reaction that required hospitalization: No - went to doctors office Has patient had a PCN reaction occurring within the last 10 years: No If all of the above answers are "NO", then may proceed with Cephalosporin use.   . Thallium Itching and Rash  . Codeine Itching  . Gabapentin Other (See Comments)    "made me feel drugged"  . Eliquis [Apixaban] Diarrhea      Observations/Objective: Patient sounds cheerful and well on the phone. I do not appreciate any SOB. Speech and thought processing are grossly intact. Patient reported vitals:  Assessment and Plan: #1 history of diabetic neuropathy currently stable on Lyrica -Refill Lyrica for 6 months  #2 type 2 diabetes.  History of fair control. -Refilled Jardiance and we recommended follow-up by September for further labs including Y7C  #3 metabolic syndrome  #4 history of atrial fibrillation on chronic anticoagulation with Xarelto  Follow Up Instructions:  -Patient plans to schedule an office follow-up by September   [id number] [id number] [id number] [id number] [id number] 21-30 I did not refer this patient for an OV in the next 24 hours for this/these issue(s).  I discussed the assessment and treatment plan with the patient. The patient was provided an opportunity to ask questions and all were answered. The patient agreed with the plan and demonstrated an  understanding of the instructions.   The patient was advised to call back or seek an in-person evaluation if the symptoms worsen or if the condition fails to improve as anticipated.  I provided 22 minutes of non-face-to-face time during this encounter.   Carolann Littler, MD

## 2019-03-13 NOTE — Telephone Encounter (Signed)
Last OV today  Last filled today  Can you send again so it will clear in my basket? Thank you!

## 2019-03-13 NOTE — Telephone Encounter (Signed)
lyrica already sent in.

## 2019-04-25 NOTE — Progress Notes (Signed)
Cardiology Office Note:    Date:  04/26/2019   ID:  Eric Palmer, DOB 1955/10/28, MRN [id number]  PCP:  [name], MD  Cardiologist:  [name], MD   Referring MD: [name], MD   Chief Complaint  Patient presents with  . Atrial Fibrillation  . Coronary Artery Disease    History of Present Illness:    Eric Palmer is a 63 y.o. male with a hx of coronaryartery disease, remote right coronary stent in 1995, moderate mid LAD stenosis in 2007 by cath, and recent low risk myocardial perfusion study. Intercurrent diagnoses of diabetes mellitus, known history of hypertension, obesity, paroxysmal atrial fibrillation on sotalol therapy, and poorly controlled hyperlipidemia. Also history of obstructive sleep apnea.  Eric Palmer.  He retired from [employer].  He retired from [place] [employer] and is now working for a third company.  No cardiac complaints.  Has gained weight in the COVID-19 pandemic.  Overall he is in good spirits.  He denies transient neurological complaints.  No blood in his urine or stool.  He denies palpitations.  There is no shortness of breath or peripheral edema.  Past Medical History:  Diagnosis Date  . Anal fissure   . Chronic bronchitis ([place])    "get it q spring and fall" (10/27/2013)  . Coronary artery disease    a. s/p PCI in 1996. b. LHC 01/2006 showed 50% mid LAD, otherwise widely patent cors with minimal irregularities. c. Nuclear stress test in 04/2009 was reportedly low risk, no ischemia. // d. Nuclear stress test 2/19: EF 55, transient ST depression in recovery, no ischemia, inf defect (artifact vs scar)   . DM type 2, uncontrolled, with neuropathy ([place])   . ED (erectile dysfunction)   . Essential hypertension   . Hx of echocardiogram    Echo (10/2013): Mild LVH, moderate focal basal hypertrophy of the septum, EF 50-55%, no RWMA  . Hyperlipidemia   . Internal hemorrhoids without mention of complication   . Obesity   . Obesity   .  OSA on CPAP   . Paresthesia of both legs   . Paroxysmal atrial fibrillation ([name])    a. failed Tikosyn, Multaq. b. s/p ablation of AF/AFL in 12/2013.  [name] Paroxysmal atrial flutter ([place])    a. s/p ablation of AF/AFL in 12/2013.  [name] Smoker    QUIT 10/13/13    Past Surgical History:  Procedure Laterality Date  . ABLATION  01-12-2014   PVI and CTI by Dr [name]  . ANAL FISSURE REPAIR  ~ 2010  . ATRIAL FIBRILLATION ABLATION N/A 01/12/2014   Procedure: ATRIAL FIBRILLATION ABLATION;  Surgeon: Coralyn Mark, MD;  Location: [facility];  Service: Cardiovascular;  Laterality: N/A;  . CARDIAC CATHETERIZATION  2007  . CORONARY ANGIOPLASTY WITH STENT PLACEMENT  1996   residual 50% LAD and RCA by cath in 2007  . CORONARY ANGIOPLASTY WITH STENT PLACEMENT  1995  . INGUINAL HERNIA REPAIR Right 1963  . LEFT HEART CATH AND CORONARY ANGIOGRAPHY N/A 10/11/2017   Procedure: LEFT HEART CATH AND CORONARY ANGIOGRAPHY;  Surgeon: [name], MD;  Location: [facility];  Service: Cardiovascular;  Laterality: N/A;  . TEE WITHOUT CARDIOVERSION N/A 01/11/2014   Procedure: TRANSESOPHAGEAL ECHOCARDIOGRAM (TEE);  Surgeon: [name], MD;  Location: [facility];  Service: Cardiovascular;  Laterality: N/A;  . TONSILLECTOMY AND ADENOIDECTOMY  1979    Current Medications: Current Meds  Medication Sig  .  acetaminophen (TYLENOL) 500 MG tablet Take 500 mg by mouth every 6 (six) hours as needed for moderate pain or headache.  . albuterol (VENTOLIN HFA) 108 (90 Base) MCG/ACT inhaler INHALE 2 PUFFS INTO THE LUNGS EVERY 2 HOURS AS NEEDED FOR WHEEZING OR SHORTNESS OF BREATH  . atorvastatin (LIPITOR) 40 MG tablet Take 1 tablet (40 mg total) by mouth daily at 6 PM.  . diltiazem (CARDIZEM CD) 360 MG 24 hr capsule TAKE 1 CAPSULE(360 MG) BY MOUTH DAILY  . empagliflozin (JARDIANCE) 10 MG TABS tablet Take 10 mg by mouth daily.  . fish oil-omega-3 fatty acids 1000 MG capsule Take 1 g by mouth 2 (two) times daily.   .  metFORMIN (GLUCOPHAGE-XR) 750 MG 24 hr tablet Take 1 tablet (750 mg total) by mouth 2 (two) times daily.  . nitroGLYCERIN (NITROSTAT) 0.4 MG SL tablet DISSOLVE 1 TABLET UNDER THE TONGUE EVERY 5 MINUTES FOR 3 DOSES AS NEEDED FOR CHEST PAIN(CALL 911 IF 3RD DOSE IS TAKEN)  . ONETOUCH DELICA LANCETS FINE MISC USE TO CHECK BLOOD SUGAR ONCE DAILY  . ONETOUCH VERIO test strip USE TO CHECK BLOOD SUGAR ONCE DAILY  . pregabalin (LYRICA) 100 MG capsule Take 1 capsule in the morning, 1 capsule at noon, and 2 capsules qhs  . sotalol (BETAPACE) 80 MG tablet TAKE 1 TABLET(80 MG) BY MOUTH TWICE DAILY  . tadalafil (CIALIS) 20 MG tablet Take 1 tablet (20 mg total) by mouth daily as needed for erectile dysfunction.  [name] INHUB 100-50 MCG/DOSE AEPB INHALE 1 PUFF INTO THE LUNGS TWICE DAILY  . XARELTO 20 MG TABS tablet TAKE 1 TABLET(20 MG) BY MOUTH DAILY WITH SUPPER     Allergies:   Cephalosporins, Penicillins, Thallium, Codeine, Gabapentin, and Eliquis [apixaban]   Social History   Socioeconomic History  . Marital status: Married    Spouse name: Not on file  . Number of children: Not on file  . Years of education: Not on file  . Highest education level: Not on file  Occupational History  . Not on file  Social Needs  . Financial resource strain: Not on file  . Food insecurity    Worry: Not on file    Inability: Not on file  . Transportation needs    Medical: Not on file    Non-medical: Not on file  Tobacco Use  . Smoking status: Current Every Day Smoker    Packs/day: 1.00    Years: 43.00    Pack years: 43.00    Types: Cigarettes  . Smokeless tobacco: Never Used  Substance and Sexual Activity  . Alcohol use: Yes    Alcohol/week: 0.0 standard drinks    Comment: 10/27/2013 "might have 1 beer/month"  . Drug use: No  . Sexual activity: Yes  Lifestyle  . Physical activity    Days per week: Not on file    Minutes per session: Not on file  . Stress: Not on file  Relationships  . Social  Herbalist on phone: Not on file    Gets together: Not on file    Attends religious service: Not on file    Active member of club or organization: Not on file    Attends meetings of clubs or organizations: Not on file    Relationship status: Not on file  Other Topics Concern  . Not on file  Social History Narrative   Works as an Programme researcher, broadcasting/film/video for [employer] History: The patient's family history  includes Heart attack in his father; Heart disease in an other family member; Heart disease (age of onset: 72) in his father; Heart disease (age of onset: 47) in his mother; Hyperlipidemia in an other family member; Hypertension in his mother. There is no history of Stroke.  ROS:   Please see the history of present illness.    He does have intermittent diarrhea.  He wonders if 1 of his medications is causing this.  All other systems reviewed and are negative.  EKGs/Labs/Other Studies Reviewed:    The following studies were reviewed today: No new imaging data  EKG:  EKG normal sinus rhythm with vertical axis and nonspecific T wave flattening  Recent Labs: 08/12/2018: ALT 27; BUN 15; Creatinine, Ser 0.87; Hemoglobin 15.8; Platelets 267.0; Potassium 4.4; Sodium 138  Recent Lipid Panel    Component Value Date/Time   CHOL 127 08/12/2018 1124   TRIG 374.0 (H) 08/12/2018 1124   HDL 34.40 (L) 08/12/2018 1124   CHOLHDL 4 08/12/2018 1124   VLDL 74.8 (H) 08/12/2018 1124   LDLCALC 5 08/15/2017 0419   LDLDIRECT 49.0 08/12/2018 1124    Physical Exam:    VS:  BP 114/74   Pulse 84   Ht '6\' 1"'  (1.854 m)   Wt 250 lb (113.4 kg)   SpO2 96%   BMI 32.98 kg/m     Wt Readings from Last 3 Encounters:  04/26/19 250 lb (113.4 kg)  08/12/18 245 lb 4.8 oz (111.3 kg)  04/12/18 247 lb 6.4 oz (112.2 kg)     GEN: Moderate obesity with weight gain since last office visit. No acute distress HEENT: Normal NECK: No JVD. LYMPHATICS: No lymphadenopathy CARDIAC:  RRR without murmur, gallop,  or edema. VASCULAR:  Normal Pulses. No bruits. RESPIRATORY:  Clear to auscultation without rales, wheezing or rhonchi  ABDOMEN: Soft, non-tender, non-distended, No pulsatile mass, MUSCULOSKELETAL: No deformity  SKIN: Warm and dry NEUROLOGIC:  Alert and oriented x 3 PSYCHIATRIC:  Normal affect   ASSESSMENT:    1. Coronary artery disease involving native coronary artery of native heart with angina pectoris ([place])   2. Essential hypertension   3. Other hyperlipidemia   4. Chronic anticoagulation   5. Obstructive sleep apnea   6. Type 2 diabetes mellitus with diabetic polyneuropathy, without long-term current use of insulin ([place])   7. Tobacco use disorder   8. Educated About Covid-19 Virus Infection    PLAN:    In order of problems listed above:  1. Secondary prevention discussed in detail as outlined below. 2. Blood pressure is below target.  Vania Rea is help to lower blood pressure. 3. LDL is in a good range but triglycerides are still significantly elevated.  Plan to discontinue omega-3 fatty acid and start VASCEPA 2 g p.o. twice daily.  Lipid panel in 2 months. 4. Continue Xarelto 5. CPAP is encouraged 6. Hemoglobin A1c will be done in 2 months along with a C met and CBC. 7. Not currently smoking. 8. Social, distancing, and mask wearing is encouraged.  Overall education and awareness concerning primary/secondary risk prevention was discussed in detail: LDL less than 70, hemoglobin A1c less than 7, blood pressure target less than 130/80 mmHg, >150 minutes of moderate aerobic activity per week, avoidance of smoking, weight control (via diet and exercise), and continued surveillance/management of/for obstructive sleep apnea.   Clinical follow-up with me in 1 year.  Laboratory work in 2 months.  Encourage weight loss, 150 minutes of moderate activity per week.   Medication  Adjustments/Labs and Tests Ordered: Current medicines are reviewed at length with the patient today.   Concerns regarding medicines are outlined above.  No orders of the defined types were placed in this encounter.  No orders of the defined types were placed in this encounter.   There are no Patient Instructions on file for this visit.   Signed, [name], MD  04/26/2019 4:36 PM    [facility]

## 2019-04-26 ENCOUNTER — Encounter: Payer: Self-pay | Admitting: Interventional Cardiology

## 2019-04-26 ENCOUNTER — Other Ambulatory Visit: Payer: Self-pay

## 2019-04-26 ENCOUNTER — Ambulatory Visit: Payer: 59 | Admitting: Interventional Cardiology

## 2019-04-26 VITALS — BP 114/74 | HR 84 | Ht 73.0 in | Wt 250.0 lb

## 2019-04-26 DIAGNOSIS — E1142 Type 2 diabetes mellitus with diabetic polyneuropathy: Secondary | ICD-10-CM

## 2019-04-26 DIAGNOSIS — Z7901 Long term (current) use of anticoagulants: Secondary | ICD-10-CM

## 2019-04-26 DIAGNOSIS — Z7189 Other specified counseling: Secondary | ICD-10-CM

## 2019-04-26 DIAGNOSIS — F17200 Nicotine dependence, unspecified, uncomplicated: Secondary | ICD-10-CM

## 2019-04-26 DIAGNOSIS — G4733 Obstructive sleep apnea (adult) (pediatric): Secondary | ICD-10-CM

## 2019-04-26 DIAGNOSIS — E7849 Other hyperlipidemia: Secondary | ICD-10-CM

## 2019-04-26 DIAGNOSIS — I10 Essential (primary) hypertension: Secondary | ICD-10-CM

## 2019-04-26 DIAGNOSIS — I25119 Atherosclerotic heart disease of native coronary artery with unspecified angina pectoris: Secondary | ICD-10-CM | POA: Diagnosis not present

## 2019-04-26 MED ORDER — VASCEPA 1 G PO CAPS: 2.0000 | ORAL_CAPSULE | Freq: Two times a day (BID) | ORAL | 11 refills | Status: DC

## 2019-04-26 NOTE — Patient Instructions (Signed)
Medication Instructions:  1) DISCONTINUE Omega 3 Fatty Acids 2) START Vascepa 2grams (2 capsules) twice daily  If you need a refill on your cardiac medications before your next appointment, please call your pharmacy.   Lab work: Your physician recommends that you return for lab work in: 2 months (Lipid, Liver, LPa, A!C, BMET).  You will need to be fasting for these labs (nothing to eat or drink after midnight except water and black coffee).  If you have labs (blood work) drawn today and your tests are completely normal, you will receive your results only by: [name] MyChart Message (if you have MyChart) OR . A paper copy in the mail If you have any lab test that is abnormal or we need to change your treatment, we will call you to review the results.  Testing/Procedures: None  Follow-Up: At [facility], you and your health needs are our priority.  As part of our continuing mission to provide you with exceptional heart care, we have created designated Provider Care Teams.  These Care Teams include your primary Cardiologist (physician) and Advanced Practice Providers (APPs -  Physician Assistants and Nurse Practitioners) who all work together to provide you with the care you need, when you need it. You will need a follow up appointment in 12 months.  Please call our office 2 months in advance to schedule this appointment.  You may see [name], MD or one of the following Advanced Practice Providers on your designated Care Team:   Truitt Merle, NP Cecilie Kicks, NP . Kathyrn Drown, NP  Any Other Special Instructions Will Be Listed Below (If Applicable).

## 2019-04-26 NOTE — Addendum Note (Signed)
Addended by: Loren Racer on: 04/26/2019 04:48 PM   Modules accepted: Orders

## 2019-05-01 ENCOUNTER — Other Ambulatory Visit: Payer: Self-pay | Admitting: Interventional Cardiology

## 2019-05-12 ENCOUNTER — Other Ambulatory Visit: Payer: Self-pay | Admitting: Interventional Cardiology

## 2019-05-12 MED ORDER — ATORVASTATIN CALCIUM 40 MG PO TABS: 40.0000 mg | ORAL_TABLET | Freq: Every day | ORAL | 3 refills | Status: DC

## 2019-05-19 ENCOUNTER — Ambulatory Visit (INDEPENDENT_AMBULATORY_CARE_PROVIDER_SITE_OTHER)
Admission: RE | Admit: 2019-05-19 | Discharge: 2019-05-19 | Disposition: A | Discharge status: Home / Self Care | Payer: 59 | Source: Ambulatory Visit | Attending: Acute Care | Admitting: Acute Care

## 2019-05-19 ENCOUNTER — Other Ambulatory Visit: Payer: Self-pay

## 2019-05-19 DIAGNOSIS — F17210 Nicotine dependence, cigarettes, uncomplicated: Secondary | ICD-10-CM | POA: Diagnosis not present

## 2019-05-19 DIAGNOSIS — Z122 Encounter for screening for malignant neoplasm of respiratory organs: Secondary | ICD-10-CM

## 2019-05-23 ENCOUNTER — Telehealth: Payer: Self-pay | Admitting: Acute Care

## 2019-05-23 ENCOUNTER — Other Ambulatory Visit: Payer: Self-pay | Admitting: Internal Medicine

## 2019-05-23 DIAGNOSIS — Z122 Encounter for screening for malignant neoplasm of respiratory organs: Secondary | ICD-10-CM

## 2019-05-23 DIAGNOSIS — F17210 Nicotine dependence, cigarettes, uncomplicated: Secondary | ICD-10-CM

## 2019-05-23 NOTE — Telephone Encounter (Signed)
burchette pt

## 2019-05-23 NOTE — Telephone Encounter (Signed)
Pt informed of CT results per Sarah Groce, NP.  PT verbalized understanding.  Copy sent to PCP.  Order placed for 1 yr f/u CT.  

## 2019-05-25 ENCOUNTER — Telehealth (INDEPENDENT_AMBULATORY_CARE_PROVIDER_SITE_OTHER): Payer: Self-pay | Admitting: Acute Care

## 2019-05-26 ENCOUNTER — Other Ambulatory Visit: Payer: Self-pay | Admitting: *Deleted

## 2019-05-26 DIAGNOSIS — I480 Paroxysmal atrial fibrillation: Secondary | ICD-10-CM

## 2019-05-26 MED ORDER — RIVAROXABAN 20 MG PO TABS: ORAL_TABLET | ORAL | 1 refills | Status: DC

## 2019-05-26 NOTE — Telephone Encounter (Signed)
Prescription refill request for Xarelto received.   Last office visit: Eric Palmer (04-26-2019) Weight: 113.4 kg Age: 63 y.o. Scr: 0.87 (08-12-2018) CrCl: 141 ml/min  Prescription refill sent.

## 2019-06-12 ENCOUNTER — Other Ambulatory Visit: Payer: Self-pay | Admitting: Family Medicine

## 2019-06-12 MED ORDER — ONETOUCH VERIO VI STRP: ORAL_STRIP | 0 refills | Status: DC

## 2019-06-12 NOTE — Telephone Encounter (Signed)
Please see below request  

## 2019-06-12 NOTE — Telephone Encounter (Signed)
Medication refill: ONETOUCH VERIO test strip [id number]  pregabalin (LYRICA) 100 MG capsule [id number] albuterol (VENTOLIN HFA) 108 (90 Base) MCG/ACT inhaler [id number]     Pharmacy:  [facility] [place], [address] AT [address] [phone] (Phone) [phone] (Fax)   Pt aware of turn around time

## 2019-06-12 NOTE — Telephone Encounter (Signed)
Requested medication (s) are due for refill today: yes  Requested medication (s) are on the active medication list: yes  Last refill:  03/13/2019  Future visit scheduled: no  Notes to clinic:  Refill cannot be delegated    Requested Prescriptions  Pending Prescriptions Disp Refills   pregabalin (LYRICA) 100 MG capsule 360 capsule 1    Sig: Take 1 capsule in the morning, 1 capsule at noon, and 2 capsules qhs     Not Delegated - Neurology:  Anticonvulsants - Controlled Failed - 06/12/2019 10:47 AM      Failed - This refill cannot be delegated      Passed - Valid encounter within last 12 months    Recent Outpatient Visits          3 months ago Metabolic syndrome   Therapist, music at [facility], Alinda Sierras, MD   10 months ago Essential hypertension   Therapist, music at [facility], Alinda Sierras, MD   2 years ago Acute recurrent pansinusitis   Therapist, music at [facility], [place], [place]   2 years ago Physical exam   Therapist, music at [facility], Alinda Sierras, MD   2 years ago [place] at [facility], [place] R, DO              albuterol (VENTOLIN HFA) 108 (90 Base) MCG/ACT inhaler 8.5 g 0     Pulmonology:  Beta Agonists Failed - 06/12/2019 10:47 AM      Failed - One inhaler should last at least one month. If the patient is requesting refills earlier, contact the patient to check for uncontrolled symptoms.      Passed - Valid encounter within last 12 months    Recent Outpatient Visits          3 months ago Metabolic syndrome   Therapist, music at [place], MD   10 months ago Essential hypertension   Therapist, music at [facility], Alinda Sierras, MD   2 years ago Acute recurrent pansinusitis   Therapist, music at [facility], [place], NP   2 years ago Physical exam   Therapist, music at [facility], Alinda Sierras, MD   2 years ago [place] at  [place] R, DO             Signed Prescriptions Disp Refills   glucose blood (ONETOUCH VERIO) test strip 100 each 0    Sig: USE TO [place]     There is no refill protocol information for this order

## 2019-06-16 MED ORDER — ALBUTEROL SULFATE HFA 108 (90 BASE) MCG/ACT IN AERS: 2.0000 | INHALATION_SPRAY | RESPIRATORY_TRACT | 0 refills | Status: DC | PRN

## 2019-06-17 NOTE — Telephone Encounter (Signed)
Refilled for 6 months back in July   Should not be due yet.   He is due for follow up labs.  Future order for labs has already been placed.

## 2019-06-21 ENCOUNTER — Other Ambulatory Visit: Payer: Self-pay | Admitting: Family Medicine

## 2019-06-27 LAB — HM DIABETES EYE EXAM

## 2019-06-29 ENCOUNTER — Other Ambulatory Visit: Payer: Self-pay

## 2019-06-29 ENCOUNTER — Encounter: Payer: Self-pay | Admitting: Family Medicine

## 2019-06-29 ENCOUNTER — Other Ambulatory Visit: Payer: 59 | Admitting: *Deleted

## 2019-06-29 DIAGNOSIS — I10 Essential (primary) hypertension: Secondary | ICD-10-CM

## 2019-06-29 DIAGNOSIS — I25119 Atherosclerotic heart disease of native coronary artery with unspecified angina pectoris: Secondary | ICD-10-CM

## 2019-06-29 DIAGNOSIS — E1142 Type 2 diabetes mellitus with diabetic polyneuropathy: Secondary | ICD-10-CM

## 2019-06-29 DIAGNOSIS — E7849 Other hyperlipidemia: Secondary | ICD-10-CM

## 2019-06-29 DIAGNOSIS — Z7901 Long term (current) use of anticoagulants: Secondary | ICD-10-CM

## 2019-06-30 ENCOUNTER — Other Ambulatory Visit: Payer: 59

## 2019-06-30 ENCOUNTER — Telehealth: Payer: Self-pay | Admitting: *Deleted

## 2019-06-30 LAB — LIPID PANEL
Chol/HDL Ratio: 3.7 ratio (ref 0.0–5.0)
Cholesterol, Total: 130 mg/dL (ref 100–199)
HDL: 35 mg/dL — ABNORMAL LOW (ref >39)
LDL Chol Calc (NIH): 35 mg/dL (ref 0–99)
Triglycerides: 419 mg/dL — ABNORMAL HIGH (ref 0–149)
VLDL Cholesterol Cal: 60 mg/dL — ABNORMAL HIGH (ref 5–40)

## 2019-06-30 LAB — BASIC METABOLIC PANEL
BUN/Creatinine Ratio: 12 (ref 10–24)
BUN: 12 mg/dL (ref 8–27)
CO2: 24 mmol/L (ref 20–29)
Calcium: 9.5 mg/dL (ref 8.6–10.2)
Chloride: 102 mmol/L (ref 96–106)
Creatinine, Ser: 0.98 mg/dL (ref 0.76–1.27)
GFR calc Af Amer: 95 mL/min/{1.73_m2} (ref >59)
GFR calc non Af Amer: 82 mL/min/{1.73_m2} (ref >59)
Glucose: 161 mg/dL — ABNORMAL HIGH (ref 65–99)
Potassium: 4.6 mmol/L (ref 3.5–5.2)
Sodium: 140 mmol/L (ref 134–144)

## 2019-06-30 LAB — HEPATIC FUNCTION PANEL
ALT: 37 IU/L (ref 0–44)
AST: 19 IU/L (ref 0–40)
Albumin: 4.4 g/dL (ref 3.8–4.8)
Alkaline Phosphatase: 71 IU/L (ref 39–117)
Bilirubin Total: 0.4 mg/dL (ref 0.0–1.2)
Bilirubin, Direct: 0.11 mg/dL (ref 0.00–0.40)
Total Protein: 6.9 g/dL (ref 6.0–8.5)

## 2019-06-30 LAB — CBC
Hematocrit: 46.4 % (ref 37.5–51.0)
Hemoglobin: 16.1 g/dL (ref 13.0–17.7)
MCH: 31.1 pg (ref 26.6–33.0)
MCHC: 34.7 g/dL (ref 31.5–35.7)
MCV: 90 fL (ref 79–97)
Platelets: 237 10*3/uL (ref 150–450)
RBC: 5.17 x10E6/uL (ref 4.14–5.80)
RDW: 12.7 % (ref 11.6–15.4)
WBC: 8.4 10*3/uL (ref 3.4–10.8)

## 2019-06-30 LAB — HEMOGLOBIN A1C
Est. average glucose Bld gHb Est-mCnc: 151 mg/dL
Hgb A1c MFr Bld: 6.9 % — ABNORMAL HIGH (ref 4.8–5.6)

## 2019-06-30 NOTE — Telephone Encounter (Signed)
Pt has been made aware of lab results by phone with verbal understanding. Pt states he stopped taking the Vascepa about 3 weeks ago due to nausea. Pt states he tried taking it at different times in the day, tried freezing it but nothing worked to keep the nausea away. I advised the pt let us d/w Dr. Tamala Julian about the [facility] and see if he has any new recommendations and we will call him back one day next week. Pt thanked me for the call. I have send a copy to PCP Dr. Elease Hashimoto. The patient has been notified of the result and verbalized understanding.  All questions (if any) were answered. Julaine Hua, [place] 06/30/2019 3:20 PM

## 2019-07-02 NOTE — Telephone Encounter (Signed)
Nothing new. Stay off.

## 2019-07-03 NOTE — Telephone Encounter (Signed)
Spoke with pt and made him aware of recommendations.  Pt verbalized understanding and was in agreement with this plan.  

## 2019-07-15 ENCOUNTER — Other Ambulatory Visit: Payer: Self-pay | Admitting: Family Medicine

## 2019-08-14 ENCOUNTER — Other Ambulatory Visit: Payer: Self-pay | Admitting: Family Medicine

## 2019-08-14 ENCOUNTER — Other Ambulatory Visit: Payer: Self-pay | Admitting: Interventional Cardiology

## 2019-08-16 ENCOUNTER — Other Ambulatory Visit: Payer: Self-pay | Admitting: Family Medicine

## 2019-08-31 ENCOUNTER — Other Ambulatory Visit: Payer: Self-pay

## 2019-08-31 MED ORDER — DILTIAZEM HCL ER COATED BEADS 360 MG PO CP24: ORAL_CAPSULE | ORAL | 2 refills | Status: DC

## 2019-09-03 ENCOUNTER — Other Ambulatory Visit: Payer: Self-pay | Admitting: Family Medicine

## 2019-09-11 ENCOUNTER — Other Ambulatory Visit: Payer: Self-pay | Admitting: Family Medicine

## 2019-09-19 ENCOUNTER — Other Ambulatory Visit: Payer: Self-pay | Admitting: Family Medicine

## 2019-09-20 ENCOUNTER — Other Ambulatory Visit: Payer: Self-pay | Admitting: Family Medicine

## 2019-09-26 ENCOUNTER — Telehealth: Payer: Self-pay

## 2019-09-26 NOTE — Telephone Encounter (Signed)
**Note De-Identified Dream Harman Obfuscation** I started a Vascepa PA through covermymeds. Key: [id number]

## 2019-09-29 NOTE — Telephone Encounter (Signed)
**Note De-Identified Chelsae Zanella Obfuscation** Per a message from covermymeds I did an appeal on this Vascepa PA.

## 2019-10-02 ENCOUNTER — Telehealth: Payer: Self-pay | Admitting: Interventional Cardiology

## 2019-10-02 NOTE — Telephone Encounter (Signed)
Spoke with pt and he knows when he is going in and out of Afib.  States he hasn't had any episodes in months.  Over the weekend had episodes that lasted longer than normal.  Had CP with these episodes of Afib.  Changed appt to in person with Robbie Lis, PA-C tomorrow.  Pt appreciative for call.

## 2019-10-02 NOTE — Telephone Encounter (Signed)
New Message   Patient sent a request via mychart to see Dr. Tamala Julian or someone on his team due to increase afib. I have scheduled him a virtual appt with Cecilie Kicks on 10/03/19.

## 2019-10-03 ENCOUNTER — Ambulatory Visit: Payer: BC Managed Care – PPO | Admitting: Physician Assistant

## 2019-10-03 ENCOUNTER — Encounter: Payer: Self-pay | Admitting: Physician Assistant

## 2019-10-03 ENCOUNTER — Other Ambulatory Visit: Payer: Self-pay

## 2019-10-03 ENCOUNTER — Telehealth: Payer: 59 | Admitting: Cardiology

## 2019-10-03 VITALS — BP 120/80 | HR 70 | Ht 73.0 in | Wt 248.6 lb

## 2019-10-03 DIAGNOSIS — I10 Essential (primary) hypertension: Secondary | ICD-10-CM | POA: Diagnosis not present

## 2019-10-03 DIAGNOSIS — I4891 Unspecified atrial fibrillation: Secondary | ICD-10-CM

## 2019-10-03 DIAGNOSIS — Z7901 Long term (current) use of anticoagulants: Secondary | ICD-10-CM

## 2019-10-03 DIAGNOSIS — I25119 Atherosclerotic heart disease of native coronary artery with unspecified angina pectoris: Secondary | ICD-10-CM | POA: Diagnosis not present

## 2019-10-03 DIAGNOSIS — I480 Paroxysmal atrial fibrillation: Secondary | ICD-10-CM | POA: Diagnosis not present

## 2019-10-03 DIAGNOSIS — G4733 Obstructive sleep apnea (adult) (pediatric): Secondary | ICD-10-CM

## 2019-10-03 NOTE — Patient Instructions (Signed)
Your physician recommends that you continue on your current medications as directed. Please refer to the Current Medication list given to you today.   Your physician recommends that you schedule a follow-up appointment in:  1-2 months with Dr Tamala Julian

## 2019-10-03 NOTE — Progress Notes (Signed)
Cardiology Office Note    Date:  10/03/2019   ID:  Eric Palmer, DOB 24-Nov-1955, MRN [id number]  PCP:  [name], MD  Cardiologist: Dr. Tamala Julian   Chief Complaint: Afib  History of Present Illness:   Eric Palmer is a 64 y.o. male CAD s/p RCA stent remotely, PAF, DM, HTN, obesity, HLD and OSA seen for afib.  Hx of CAD s/p remote right coronary stent in 1995, moderate mid LAD stenosis in 2007 by cath.  Last cath 09/2017 showed 50% in-stent restenosis, 50-70% pLAD (similar to cath in 2007), 30% 1st dig. Recommended medical therapy given no anterior apical ischemia on recent stress test. If LAD intervention becomes required, rotational or orbital atherectomy should be considered.   Last seen by Dr. Tamala Julian 04/2019.  [name] had a few episodes of atrial fibrillation in the past 3 to 4 days.  He can tell when he goes in and out of A. fib.  He feels shortness of breath, chest tightness and fatigue.  He felt like he was wheezing over the weekend which helped with albuterol inhaler.  Lungs are clear today.  Longest episode 30 minutes on Friday.  He is in sinus rhythm today and feels good.  No change in diet or caffeine intake.  He does yard work and goes up and down off a stair without chest discomfort or shortness of breath.  No recent exertional fatigue.  Denies orthopnea, PND, syncope, lower extremity edema or melena.  Compliant with his medication.   Past Medical History:  Diagnosis Date  . Anal fissure   . Chronic bronchitis ([place])    "get it q spring and fall" (10/27/2013)  . Coronary artery disease    a. s/p PCI in 1996. b. LHC 01/2006 showed 50% mid LAD, otherwise widely patent cors with minimal irregularities. c. Nuclear stress test in 04/2009 was reportedly low risk, no ischemia. // d. Nuclear stress test 2/19: EF 55, transient ST depression in recovery, no ischemia, inf defect (artifact vs scar)   . DM type 2, uncontrolled, with neuropathy ([place])   . ED (erectile  dysfunction)   . Essential hypertension   . Hx of echocardiogram    Echo (10/2013): Mild LVH, moderate focal basal hypertrophy of the septum, EF 50-55%, no RWMA  . Hyperlipidemia   . Internal hemorrhoids without mention of complication   . Obesity   . Obesity   . OSA on CPAP   . Paresthesia of both legs   . Paroxysmal atrial fibrillation ([name])    a. failed Tikosyn, Multaq. b. s/p ablation of AF/AFL in 12/2013.  [name] Paroxysmal atrial flutter ([name])    a. s/p ablation of AF/AFL in 12/2013.  [name] Smoker    QUIT 10/13/13    Past Surgical History:  Procedure Laterality Date  . ABLATION  01-12-2014   PVI and CTI by Dr [name]  . ANAL FISSURE REPAIR  ~ 2010  . ATRIAL FIBRILLATION ABLATION N/A 01/12/2014   Procedure: ATRIAL FIBRILLATION ABLATION;  Surgeon: Coralyn Mark, MD;  Location: [facility];  Service: Cardiovascular;  Laterality: N/A;  . CARDIAC CATHETERIZATION  2007  . CORONARY ANGIOPLASTY WITH STENT PLACEMENT  1996   residual 50% LAD and RCA by cath in 2007  . CORONARY ANGIOPLASTY WITH STENT PLACEMENT  1995  . INGUINAL HERNIA REPAIR Right 1963  . LEFT HEART CATH AND CORONARY ANGIOGRAPHY N/A 10/11/2017   Procedure: LEFT HEART CATH AND CORONARY ANGIOGRAPHY;  Surgeon: [name], MD;  Location: [facility];  Service: Cardiovascular;  Laterality: N/A;  . TEE WITHOUT CARDIOVERSION N/A 01/11/2014   Procedure: TRANSESOPHAGEAL ECHOCARDIOGRAM (TEE);  Surgeon: [name], MD;  Location: [facility];  Service: Cardiovascular;  Laterality: N/A;  . TONSILLECTOMY AND ADENOIDECTOMY  1979    Current Medications: Prior to Admission medications   Medication Sig Start Date End Date Taking? Authorizing Provider  acetaminophen (TYLENOL) 500 MG tablet Take 500 mg by mouth every 6 (six) hours as needed for moderate pain or headache.    [provider]  albuterol (VENTOLIN HFA) 108 (90 Base) MCG/ACT inhaler INHALE 2 PUFFS INTO THE LUNGS EVERY 2 HOURS AS NEEDED FOR WHEEZING OR SHORTNESS  OF BREATH 09/20/19   Burchette, Alinda Sierras, MD  atorvastatin (LIPITOR) 40 MG tablet Take 1 tablet (40 mg total) by mouth daily at 6 PM. 05/12/19   [name], MD  diltiazem (CARDIZEM CD) 360 MG 24 hr capsule TAKE 1 CAPSULE(360 MG) BY MOUTH DAILY 08/31/19   [name], MD  glucose blood (ONETOUCH VERIO) test strip USE TO CHECK BLOOD SUGAR ONCE DAILY 06/12/19   Burchette, Alinda Sierras, MD  Icosapent Ethyl (VASCEPA) 1 g CAPS Take 2 capsules (2 g total) by mouth 2 (two) times daily. 04/26/19   [name], MD  JARDIANCE 10 MG TABS tablet TAKE 1 TABLET BY MOUTH DAILY 09/04/19   Burchette, Alinda Sierras, MD  metFORMIN (GLUCOPHAGE-XR) 750 MG 24 hr tablet TAKE 1 TABLET BY MOUTH TWICE DAILY 09/19/19   Burchette, Alinda Sierras, MD  nitroGLYCERIN (NITROSTAT) 0.4 MG SL tablet DISSOLVE 1 TABLET UNDER THE TONGUE EVERY 5 MINUTES FOR 3 DOSES AS NEEDED FOR CHEST PAIN(CALL 911 IF 3RD DOSE IS TAKEN) 05/11/18   [name], MD  [facility] DELICA LANCETS FINE MISC USE TO CHECK BLOOD SUGAR ONCE DAILY 03/19/17   Burchette, Alinda Sierras, MD  pregabalin (LYRICA) 100 MG capsule Take 1 capsule in the morning, 1 capsule at noon, and 2 capsules qhs 03/13/19   Burchette, Alinda Sierras, MD  rivaroxaban (XARELTO) 20 MG TABS tablet TAKE 1 TABLET(20 MG) BY MOUTH DAILY WITH SUPPER 05/26/19   [name], MD  sotalol (BETAPACE) 80 MG tablet TAKE 1 TABLET(80 MG) BY MOUTH TWICE DAILY 05/02/19   [name], MD  tadalafil (CIALIS) 20 MG tablet TAKE 1 TABLET BY MOUTH DAILY AS NEEDED FOR ERECTILE DYSFUNCTION 08/14/19   [name], MD  [name] INHUB 100-50 MCG/DOSE AEPB INHALE 1 PUFF INTO THE LUNGS TWICE DAILY 06/22/19   Burchette, Alinda Sierras, MD    Allergies:   Cephalosporins, Penicillins, Thallium, Codeine, Gabapentin, and Eliquis [apixaban]   Social History   Socioeconomic History  . Marital status: Married    Spouse name: Not on file  . Number of children: Not on file  . Years of education: Not on file  . Highest education level: Not on file    Occupational History  . Not on file  Tobacco Use  . Smoking status: Current Every Day Smoker    Packs/day: 1.00    Years: 43.00    Pack years: 43.00    Types: Cigarettes  . Smokeless tobacco: Never Used  Substance and Sexual Activity  . Alcohol use: Yes    Alcohol/week: 0.0 standard drinks    Comment: 10/27/2013 "might have 1 beer/month"  . Drug use: No  . Sexual activity: Yes  Other Topics Concern  . Not on file  Social History Narrative   Works as an Programme researcher, broadcasting/film/video  for [employer]   Social Determinants of Health   Financial Resource Strain:   . Difficulty of Paying Living Expenses: Not on file  Food Insecurity:   . Worried About Charity fundraiser in the Last Year: Not on file  . Ran Out of Food in the Last Year: Not on file  Transportation Needs:   . Lack of Transportation (Medical): Not on file  . Lack of Transportation (Non-Medical): Not on file  Physical Activity:   . Days of Exercise per Week: Not on file  . Minutes of Exercise per Session: Not on file  Stress:   . Feeling of Stress : Not on file  Social Connections:   . Frequency of Communication with Friends and Family: Not on file  . Frequency of Social Gatherings with Friends and Family: Not on file  . Attends Religious Services: Not on file  . Active Member of Clubs or Organizations: Not on file  . Attends Archivist Meetings: Not on file  . Marital Status: Not on file     Family History:  The patient's family history includes Heart attack in his father; Heart disease in an other family member; Heart disease (age of onset: 35) in his father; Heart disease (age of onset: 68) in his mother; Hyperlipidemia in an other family member; Hypertension in his mother.   ROS:   Please see the history of present illness.    ROS All other systems reviewed and are negative.   PHYSICAL EXAM:   VS:  BP 120/80   Pulse 70   Ht 6\' 1"  (1.854 m)   Wt 248 lb 9.6 oz (112.8 kg)   SpO2 97%   BMI 32.80 kg/m    GEN:  Well nourished, well developed, in no acute distress  HEENT: normal  Neck: no JVD, carotid bruits, or masses Cardiac: RRR; no murmurs, rubs, or gallops,no edema  Respiratory:  clear to auscultation bilaterally, normal work of breathing GI: soft, nontender, nondistended, + BS MS: no deformity or atrophy  Skin: warm and dry, no rash Neuro:  Alert and Oriented x 3, Strength and sensation are intact Psych: euthymic mood, full affect  Wt Readings from Last 3 Encounters:  10/03/19 248 lb 9.6 oz (112.8 kg)  04/26/19 250 lb (113.4 kg)  08/12/18 245 lb 4.8 oz (111.3 kg)      Studies/Labs Reviewed:   EKG:  EKG is ordered today.  The ekg ordered today demonstrates sinus rhythm at rate of 70 bpm  Recent Labs: 06/29/2019: ALT 37; BUN 12; Creatinine, Ser 0.98; Hemoglobin 16.1; Platelets 237; Potassium 4.6; Sodium 140   Lipid Panel    Component Value Date/Time   CHOL 130 06/29/2019 0724   TRIG 419 (H) 06/29/2019 0724   HDL 35 (L) 06/29/2019 0724   CHOLHDL 3.7 06/29/2019 0724   CHOLHDL 4 08/12/2018 1124   VLDL 74.8 (H) 08/12/2018 1124   LDLCALC 35 06/29/2019 0724   LDLDIRECT 49.0 08/12/2018 1124    Additional studies/ records that were reviewed today include:   Echocardiogram: 07/2017 Study Conclusions   - Left ventricle: The cavity size was normal. Wall thickness was  normal. Systolic function was normal. The estimated ejection  fraction was in the range of 55% to 60%. Wall motion was normal;  there were no regional wall motion abnormalities. Left  ventricular diastolic function parameters were normal.   Impressions:   - Normal LV systolic function; no significant valvular abnormality.  Cardiac Catheterization: 09/2017 LEFT HEART CATH AND CORONARY  ANGIOGRAPHY  Conclusion   50% proximal RCA in-stent restenosis.  The patient is otherwise widely patent.  Left main is widely patent  LAD contains mid 50-70% narrowing, calcified, and similar to prior angiogram performed  in 2007.  Recent nuclear study did not demonstrate any evidence of anterior apical ischemia.  First diagonal contains 30% mid body narrowing.  Normal distribution with 3 moderate to large obtuse marginals.  Basal inferior wall hypokinesis.  Preserved overall LV function with EF 50-55%.  Elevated LVEDP, 18 mmHg.  RECOMMENDATIONS:   Continue medical therapy.  If LAD intervention becomes required, rotational or orbital atherectomy should be considered.       ASSESSMENT & PLAN:    1. Paroxysmal atrial fibrillation -Few episodes in the last 3 to 4 days.  This is his first episode in a long time.  No clear etiology of his recurrent symptoms.  Stress level and caffeine intake is same.  Questionable cold/bronchitis over the weekend as albuterol helped with shortness of breath.  His lungs are clear today.  No Covid exposure.  No fever, chills, cough or congestion. -Advised to continue Cardizem, sotalol and Xarelto.  No bleeding issue. -Reassurance given.  He will give [place] a call if recurrent symptoms.  Advised to take vital with next episode of A. Fib. Indication dehydration I think we are looking 2.  Chest tightness with history of CAD -Moderate LAD disease which is managed medically. -His chest tightness only occurs with elevated heart rate.  Suspect tachycardia induced angina. -He is active and has no exertional symptoms. -Continue statin -Not on aspirin due to need of anticoagulation  3.  Diabetes mellitus -On Metformin.  Followed by PCP.  4. HTN - BP stable on current medications  5. OSA - Compliant with CPAP   Medication Adjustments/Labs and Tests Ordered: Current medicines are reviewed at length with the patient today.  Concerns regarding medicines are outlined above.  Medication changes, Labs and Tests ordered today are listed in the Patient Instructions below. Patient Instructions  Your physician recommends that you continue on your current medications as directed.  Please refer to the Current Medication list given to you today.   Your physician recommends that you schedule a follow-up appointment in:  1-2 months with Dr Tamala Julian     Signed, [place], [place]  10/03/2019 11:51 AM    [address], [place], [place]  [address] Phone: [phone]; Fax: [phone]

## 2019-10-25 NOTE — Telephone Encounter (Signed)
I never got a determination so I have re submitted this Vascepa PA through covermymeds: Key: [id number]

## 2019-11-28 NOTE — Progress Notes (Signed)
Cardiology Office Note:    Date:  11/29/2019   ID:  Eric Palmer, DOB 28-Apr-1956, MRN [id number]  PCP:  [name], MD  Cardiologist:  [name], MD   Referring MD: [name], MD   Chief Complaint  Patient presents with  . Coronary Artery Disease  . Atrial Fibrillation    History of Present Illness:    Eric Palmer is a 64 y.o. male with a hx of CAD s/p RCA stent 1996, PAF, DM, HTN, obesity, HLD and OSA seen for afib.  He had vague complaints around the time that he saw V Bhagat, PA-C.  It was assumed that he was having atrial fibrillation based upon the patient's impression.  He tells me that he would have the sensation that his chest would be tight and that he was having an asthma attack.  There was some palpitations associated.  States that he will use albuterol inhaler with improved complaints.  Never thought about using sublingual nitroglycerin.  He also had concerned that the Cipro was causing an allergic reaction.  He discontinued that medication.  Over the last 2 months he has been doing well.  No chest discomfort.  No palpitations.  He has discontinued cigarette smoking now for greater than 6 months.  Past Medical History:  Diagnosis Date  . Anal fissure   . Chronic bronchitis ([place])    "get it q spring and fall" (10/27/2013)  . Coronary artery disease    a. s/p PCI in 1996. b. LHC 01/2006 showed 50% mid LAD, otherwise widely patent cors with minimal irregularities. c. Nuclear stress test in 04/2009 was reportedly low risk, no ischemia. // d. Nuclear stress test 2/19: EF 55, transient ST depression in recovery, no ischemia, inf defect (artifact vs scar)   . DM type 2, uncontrolled, with neuropathy ([place])   . ED (erectile dysfunction)   . Essential hypertension   . Hx of echocardiogram    Echo (10/2013): Mild LVH, moderate focal basal hypertrophy of the septum, EF 50-55%, no RWMA  . Hyperlipidemia   . Internal hemorrhoids without mention of  complication   . Obesity   . Obesity   . OSA on CPAP   . Paresthesia of both legs   . Paroxysmal atrial fibrillation ([name])    a. failed Tikosyn, Multaq. b. s/p ablation of AF/AFL in 12/2013.  [name] Paroxysmal atrial flutter ([place])    a. s/p ablation of AF/AFL in 12/2013.  [name] Smoker    QUIT 10/13/13    Past Surgical History:  Procedure Laterality Date  . ABLATION  01-12-2014   PVI and CTI by Dr [name]  . ANAL FISSURE REPAIR  ~ 2010  . ATRIAL FIBRILLATION ABLATION N/A 01/12/2014   Procedure: ATRIAL FIBRILLATION ABLATION;  Surgeon: Coralyn Mark, MD;  Location: [facility];  Service: Cardiovascular;  Laterality: N/A;  . CARDIAC CATHETERIZATION  2007  . CORONARY ANGIOPLASTY WITH STENT PLACEMENT  1996   residual 50% LAD and RCA by cath in 2007  . CORONARY ANGIOPLASTY WITH STENT PLACEMENT  1995  . INGUINAL HERNIA REPAIR Right 1963  . LEFT HEART CATH AND CORONARY ANGIOGRAPHY N/A 10/11/2017   Procedure: LEFT HEART CATH AND CORONARY ANGIOGRAPHY;  Surgeon: [name], MD;  Location: [facility];  Service: Cardiovascular;  Laterality: N/A;  . TEE WITHOUT CARDIOVERSION N/A 01/11/2014   Procedure: TRANSESOPHAGEAL ECHOCARDIOGRAM (TEE);  Surgeon: [name], MD;  Location: [facility];  Service: Cardiovascular;  Laterality:  N/A;  . TONSILLECTOMY AND ADENOIDECTOMY  1979    Current Medications: Current Meds  Medication Sig  . acetaminophen (TYLENOL) 500 MG tablet Take 500 mg by mouth every 6 (six) hours as needed for moderate pain or headache.  . albuterol (VENTOLIN HFA) 108 (90 Base) MCG/ACT inhaler INHALE 2 PUFFS INTO THE LUNGS EVERY 2 HOURS AS NEEDED FOR WHEEZING OR SHORTNESS OF BREATH  . atorvastatin (LIPITOR) 40 MG tablet Take 1 tablet (40 mg total) by mouth daily at 6 PM.  . diltiazem (CARDIZEM CD) 360 MG 24 hr capsule TAKE 1 CAPSULE(360 MG) BY MOUTH DAILY  . empagliflozin (JARDIANCE) 10 MG TABS tablet Take 10 mg by mouth daily.  [name] glucose blood (ONETOUCH VERIO) test strip USE TO  CHECK BLOOD SUGAR ONCE DAILY  . metFORMIN (GLUCOPHAGE-XR) 750 MG 24 hr tablet TAKE 1 TABLET BY MOUTH TWICE DAILY  . nitroGLYCERIN (NITROSTAT) 0.4 MG SL tablet DISSOLVE 1 TABLET UNDER THE TONGUE EVERY 5 MINUTES FOR 3 DOSES AS NEEDED FOR CHEST PAIN(CALL 911 IF 3RD DOSE IS TAKEN)  . ONETOUCH DELICA LANCETS FINE MISC USE TO CHECK BLOOD SUGAR ONCE DAILY  . pregabalin (LYRICA) 100 MG capsule Take 1 capsule in the morning, 1 capsule at noon, and 2 capsules qhs  . rivaroxaban (XARELTO) 20 MG TABS tablet TAKE 1 TABLET(20 MG) BY MOUTH DAILY WITH SUPPER  . sotalol (BETAPACE) 80 MG tablet TAKE 1 TABLET(80 MG) BY MOUTH TWICE DAILY  . tadalafil (CIALIS) 20 MG tablet TAKE 1 TABLET BY MOUTH DAILY AS NEEDED FOR ERECTILE DYSFUNCTION  . WIXELA INHUB 100-50 MCG/DOSE AEPB INHALE 1 PUFF INTO THE LUNGS TWICE DAILY     Allergies:   Cephalosporins, Penicillins, Thallium, Codeine, Gabapentin, and Eliquis [apixaban]   Social History   Socioeconomic History  . Marital status: Married    Spouse name: Not on file  . Number of children: Not on file  . Years of education: Not on file  . Highest education level: Not on file  Occupational History  . Not on file  Tobacco Use  . Smoking status: Current Every Day Smoker    Packs/day: 1.00    Years: 43.00    Pack years: 43.00    Types: Cigarettes  . Smokeless tobacco: Never Used  Substance and Sexual Activity  . Alcohol use: Yes    Alcohol/week: 0.0 standard drinks    Comment: 10/27/2013 "might have 1 beer/month"  . Drug use: No  . Sexual activity: Yes  Other Topics Concern  . Not on file  Social History Narrative   Works as an Programme researcher, broadcasting/film/video for [employer] Strain:   . Difficulty of Paying Living Expenses:   Food Insecurity:   . Worried About Charity fundraiser in the Last Year:   . Arboriculturist in the Last Year:   Transportation Needs:   . Film/video editor (Medical):   [name] Lack of Transportation  (Non-Medical):   Physical Activity:   . Days of Exercise per Week:   . Minutes of Exercise per Session:   Stress:   . Feeling of Stress :   Social Connections:   . Frequency of Communication with Friends and Family:   . Frequency of Social Gatherings with Friends and Family:   . Attends Religious Services:   . Active Member of Clubs or Organizations:   . Attends Archivist Meetings:   [name] Marital Status:      Family History:  The patient's family history includes Heart attack in his father; Heart disease in an other family member; Heart disease (age of onset: 32) in his father; Heart disease (age of onset: 30) in his mother; Hyperlipidemia in an other family member; Hypertension in his mother. There is no history of Stroke.  ROS:   Please see the history of present illness.    No new data all other systems reviewed and are negative.  EKGs/Labs/Other Studies Reviewed:    The following studies were reviewed today: No new data  EKG:  EKG performed October 04, 2019, demonstrating normal sinus rhythm with normal appearance.  Recent Labs: 06/29/2019: ALT 37; BUN 12; Creatinine, Ser 0.98; Hemoglobin 16.1; Platelets 237; Potassium 4.6; Sodium 140  Recent Lipid Panel    Component Value Date/Time   CHOL 130 06/29/2019 0724   TRIG 419 (H) 06/29/2019 0724   HDL 35 (L) 06/29/2019 0724   CHOLHDL 3.7 06/29/2019 0724   CHOLHDL 4 08/12/2018 1124   VLDL 74.8 (H) 08/12/2018 1124   LDLCALC 35 06/29/2019 0724   LDLDIRECT 49.0 08/12/2018 1124    Physical Exam:    VS:  BP 118/68   Pulse 72   Ht 6\' 1"  (1.854 m)   Wt 251 lb (113.9 kg)   SpO2 96%   BMI 33.12 kg/m     Wt Readings from Last 3 Encounters:  11/29/19 251 lb (113.9 kg)  10/03/19 248 lb 9.6 oz (112.8 kg)  04/26/19 250 lb (113.4 kg)     GEN: Moderate obesity. No acute distress HEENT: Normal NECK: No JVD. LYMPHATICS: No lymphadenopathy CARDIAC:  RRR without murmur, gallop, or edema. VASCULAR:  Normal Pulses. No  bruits. RESPIRATORY:  Clear to auscultation without rales, wheezing or rhonchi  ABDOMEN: Soft, non-tender, non-distended, No pulsatile mass, MUSCULOSKELETAL: No deformity  SKIN: Warm and dry NEUROLOGIC:  Alert and oriented x 3 PSYCHIATRIC:  Normal affect   ASSESSMENT:    1. Atrial fibrillation with RVR ([place])   2. Essential hypertension   3. Coronary artery disease involving native coronary artery of native heart with angina pectoris ([place])   4. Obstructive sleep apnea   5. Chronic anticoagulation   6. Other hyperlipidemia   7. Type 2 diabetes mellitus with diabetic polyneuropathy, without long-term current use of insulin ([place])   8. Tobacco use disorder    PLAN:    In order of problems listed above:  1. Clinically in normal sinus rhythm.  Continue sotalol. 2. Blood pressure is well controlled 3. Lipids, tobacco use, and blood pressure all in line.  He is not exercising.  Strongly encouraged that he improve this metric. 4. Encouraged to continue CPAP. 5. Continue rivaroxaban.  No bleeding. 6. LDL is less than 60.  Last blood work in November.  Continue high intensity Lipitor 40 mg/day. 7. Hemoglobin A1c 6.9 in November.  Continue Jardiance along with Metformin   Overall education and awareness concerning primary/secondary risk prevention was discussed in detail: LDL less than 70, hemoglobin A1c less than 7, blood pressure target less than 130/80 mmHg, >150 minutes of moderate aerobic activity per week, avoidance of smoking, weight control (via diet and exercise), and continued surveillance/management of/for obstructive sleep apnea.    Medication Adjustments/Labs and Tests Ordered: Current medicines are reviewed at length with the patient today.  Concerns regarding medicines are outlined above.  No orders of the defined types were placed in this encounter.  No orders of the defined types were placed in this encounter.   There are no  Patient Instructions on file for this visit.     Signed, [name], MD  11/29/2019 4:41 PM    [facility]

## 2019-11-29 ENCOUNTER — Encounter: Payer: Self-pay | Admitting: Interventional Cardiology

## 2019-11-29 ENCOUNTER — Ambulatory Visit: Payer: BC Managed Care – PPO | Admitting: Interventional Cardiology

## 2019-11-29 ENCOUNTER — Other Ambulatory Visit: Payer: Self-pay

## 2019-11-29 VITALS — BP 118/68 | HR 72 | Ht 73.0 in | Wt 251.0 lb

## 2019-11-29 DIAGNOSIS — I4891 Unspecified atrial fibrillation: Secondary | ICD-10-CM | POA: Diagnosis not present

## 2019-11-29 DIAGNOSIS — F17200 Nicotine dependence, unspecified, uncomplicated: Secondary | ICD-10-CM

## 2019-11-29 DIAGNOSIS — I10 Essential (primary) hypertension: Secondary | ICD-10-CM

## 2019-11-29 DIAGNOSIS — I25119 Atherosclerotic heart disease of native coronary artery with unspecified angina pectoris: Secondary | ICD-10-CM

## 2019-11-29 DIAGNOSIS — E1142 Type 2 diabetes mellitus with diabetic polyneuropathy: Secondary | ICD-10-CM

## 2019-11-29 DIAGNOSIS — E7849 Other hyperlipidemia: Secondary | ICD-10-CM

## 2019-11-29 DIAGNOSIS — G4733 Obstructive sleep apnea (adult) (pediatric): Secondary | ICD-10-CM

## 2019-11-29 DIAGNOSIS — Z7901 Long term (current) use of anticoagulants: Secondary | ICD-10-CM

## 2019-11-29 NOTE — Patient Instructions (Signed)
Medication Instructions:  Your physician recommends that you continue on your current medications as directed. Please refer to the Current Medication list given to you today.  *If you need a refill on your cardiac medications before your next appointment, please call your pharmacy*   Lab Work: None If you have labs (blood work) drawn today and your tests are completely normal, you will receive your results only by: . MyChart Message (if you have MyChart) OR . A paper copy in the mail If you have any lab test that is abnormal or we need to change your treatment, we will call you to review the results.   Testing/Procedures: None   Follow-Up: At [facility], you and your health needs are our priority.  As part of our continuing mission to provide you with exceptional heart care, we have created designated Provider Care Teams.  These Care Teams include your primary Cardiologist (physician) and Advanced Practice Providers (APPs -  Physician Assistants and Nurse Practitioners) who all work together to provide you with the care you need, when you need it.  We recommend signing up for the patient portal called "MyChart".  Sign up information is provided on this After Visit Summary.  MyChart is used to connect with patients for Virtual Visits (Telemedicine).  Patients are able to view lab/test results, encounter notes, upcoming appointments, etc.  Non-urgent messages can be sent to your provider as well.   To learn more about what you can do with MyChart, go to [email or web address].    Your next appointment:   9 month(s)  The format for your next appointment:   In Person  Provider:   You may see Henry W Smith III, MD or one of the following Advanced Practice Providers on your designated Care Team:    Lori Gerhardt, NP  Laura Ingold, NP  Jill McDaniel, NP    Other Instructions   

## 2019-12-04 ENCOUNTER — Other Ambulatory Visit: Payer: Self-pay | Admitting: Interventional Cardiology

## 2019-12-04 ENCOUNTER — Other Ambulatory Visit: Payer: Self-pay | Admitting: Family Medicine

## 2019-12-05 ENCOUNTER — Other Ambulatory Visit: Payer: Self-pay | Admitting: *Deleted

## 2019-12-05 DIAGNOSIS — I480 Paroxysmal atrial fibrillation: Secondary | ICD-10-CM

## 2019-12-05 MED ORDER — RIVAROXABAN 20 MG PO TABS: ORAL_TABLET | ORAL | 1 refills | Status: DC

## 2019-12-05 NOTE — Telephone Encounter (Signed)
Xarelto 20mg  refill request received. Pt is 64 years old, weight-113.9kg, Crea-0.98 on 06/29/2019, last seen by Dr. Tamala Julian on 11/29/2019, Diagnosis-Afib, CrCl-125.5ml/min; Dose is appropriate based on dosing criteria. Will send in refill to requested pharmacy.

## 2020-01-04 ENCOUNTER — Other Ambulatory Visit: Payer: Self-pay | Admitting: Family Medicine

## 2020-01-11 ENCOUNTER — Encounter: Payer: Self-pay | Admitting: Gastroenterology

## 2020-01-17 ENCOUNTER — Ambulatory Visit: Payer: BC Managed Care – PPO | Admitting: Podiatry

## 2020-01-17 ENCOUNTER — Ambulatory Visit: Payer: BC Managed Care – PPO

## 2020-01-17 ENCOUNTER — Other Ambulatory Visit: Payer: Self-pay

## 2020-01-17 ENCOUNTER — Encounter: Payer: Self-pay | Admitting: Podiatry

## 2020-01-17 VITALS — Temp 96.2°F

## 2020-01-17 DIAGNOSIS — E0841 Diabetes mellitus due to underlying condition with diabetic mononeuropathy: Secondary | ICD-10-CM

## 2020-01-17 DIAGNOSIS — M79671 Pain in right foot: Secondary | ICD-10-CM | POA: Diagnosis not present

## 2020-01-17 DIAGNOSIS — M79672 Pain in left foot: Secondary | ICD-10-CM

## 2020-01-17 DIAGNOSIS — G5762 Lesion of plantar nerve, left lower limb: Secondary | ICD-10-CM | POA: Diagnosis not present

## 2020-01-17 DIAGNOSIS — G5761 Lesion of plantar nerve, right lower limb: Secondary | ICD-10-CM

## 2020-01-17 NOTE — Progress Notes (Signed)
Subjective:   Patient ID: Eric Palmer, male   DOB: 64 y.o.   MRN: [id number]   HPI Patient presents stating he gets burning on his entire feet times years.  States it is constant and seems like it is gotten worse and he is tried Lyrica has tried gabapentin and can only take it at night.  He does have diabetes but his A1c is below 7 and he does have varicose veins.  Patient does smoke a pack of cigarettes per day and would like to be more active   Review of Systems  All other systems reviewed and are negative.       Objective:  Physical Exam Vitals and nursing note reviewed.  Constitutional:      Appearance: He is well-developed.  Pulmonary:     Effort: Pulmonary effort is normal.  Musculoskeletal:        General: Normal range of motion.  Skin:    General: Skin is warm.  Neurological:     Mental Status: He is alert.     Vascular status was found to be intact with significant diminishment of neurological sensation bilateral both sharp dull vibratory.  I did find muscle strength to be adequate without problems there and I did note that there is varicosities significant nature dorsal aspect both feet.  He has pain all over his feet but I was unable to ascertain a specific trigger point for pain it seems to be worse than other problems.  Patient has good digit perfusion well oriented x3     Assessment:  Appears to be some form of neuropathy with diabetes as possibilities for neuropathic or related to possible back issues or other pathology     Plan:  H&P x-rays reviewed condition discussed at great length.  At this point I did recommend nerve biopsies to try to understand if were dealing with a large fiber or small fiber pathology and I discussed high vitamin complex and possible antidepressant to help with the nerve pain.  We could consider physical therapy or possible neurogenic's depending on symptoms.  Today I went ahead and I did proximal block of each lower leg 15 cm  proximal to the lateral malleolus.  I then did sterile prep of the area and did nerve biopsies carefully getting out an excellent sample on both the right and left side and then placed them in medium to send to lab for evaluation of nerve fibers.  Applied sterile dressings and patient will be seen back 5 weeks or earlier if needed and gave instructions on how to treat the postoperative wounds  X-rays were negative for signs of arthritis or bone pathology associated with the pain the patient is experiencing

## 2020-02-04 ENCOUNTER — Other Ambulatory Visit: Payer: Self-pay | Admitting: Family Medicine

## 2020-02-06 ENCOUNTER — Telehealth: Payer: BC Managed Care – PPO | Admitting: Family Medicine

## 2020-02-21 ENCOUNTER — Encounter: Payer: Self-pay | Admitting: Gastroenterology

## 2020-02-21 ENCOUNTER — Telehealth: Payer: Self-pay

## 2020-02-21 ENCOUNTER — Ambulatory Visit: Payer: BC Managed Care – PPO | Admitting: Podiatry

## 2020-02-21 ENCOUNTER — Other Ambulatory Visit: Payer: Self-pay

## 2020-02-21 ENCOUNTER — Ambulatory Visit: Payer: BC Managed Care – PPO | Admitting: Gastroenterology

## 2020-02-21 ENCOUNTER — Encounter: Payer: Self-pay | Admitting: Podiatry

## 2020-02-21 VITALS — Temp 97.2°F

## 2020-02-21 VITALS — BP 110/64 | HR 76 | Ht 73.0 in | Wt 248.0 lb

## 2020-02-21 DIAGNOSIS — E0841 Diabetes mellitus due to underlying condition with diabetic mononeuropathy: Secondary | ICD-10-CM

## 2020-02-21 DIAGNOSIS — R197 Diarrhea, unspecified: Secondary | ICD-10-CM | POA: Diagnosis not present

## 2020-02-21 DIAGNOSIS — K219 Gastro-esophageal reflux disease without esophagitis: Secondary | ICD-10-CM

## 2020-02-21 DIAGNOSIS — M79671 Pain in right foot: Secondary | ICD-10-CM

## 2020-02-21 DIAGNOSIS — Z86010 Personal history of colonic polyps: Secondary | ICD-10-CM

## 2020-02-21 DIAGNOSIS — Z7901 Long term (current) use of anticoagulants: Secondary | ICD-10-CM

## 2020-02-21 DIAGNOSIS — M79672 Pain in left foot: Secondary | ICD-10-CM

## 2020-02-21 MED ORDER — SUTAB 1479-225-188 MG PO TABS: 1.0000 | ORAL_TABLET | ORAL | 0 refills | Status: DC

## 2020-02-21 MED ORDER — TRAMADOL HCL 50 MG PO TABS: 50.0000 mg | ORAL_TABLET | Freq: Three times a day (TID) | ORAL | 2 refills | Status: DC

## 2020-02-21 MED ORDER — PANTOPRAZOLE SODIUM 40 MG PO TBEC: 40.0000 mg | DELAYED_RELEASE_TABLET | Freq: Every day | ORAL | 11 refills | Status: DC

## 2020-02-21 NOTE — Progress Notes (Signed)
Subjective:   Patient ID: Eric Palmer, male   DOB: 64 y.o.   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  We discussed different coping mechanisms and the possibility for aggressive treatment from a spinal cord stimulator in the future but at this point he will continue his medications consider taking antidepressant and is started on compounding cream and we are referring to physical therapy for treatment of neuropathic condition with balance exercises possible neurogenic's.  Spent a great deal of time educating them on my results and what is going on with this

## 2020-02-21 NOTE — Telephone Encounter (Signed)
   Primary Cardiologist: [name], MD  Chart reviewed as part of pre-operative protocol coverage. Given past medical history and time since last visit, based on ACC/AHA guidelines, Eric Palmer would be at acceptable risk for the planned procedure without further cardiovascular testing.   Patient with diagnosis of afib on Xarelto for anticoagulation.    Procedure: endoscopy Date of procedure: 04/09/20  CHADS2-VASc score of 3 (HTN, DM, CAD).  CrCl >100 using adjusted body weight Platelet count 237K  Per office protocol, patient can hold Xarelto for 2 days prior to procedure as requested.   I will route this recommendation to the requesting party via Epic fax function and remove from pre-op pool.  Please call with questions.  Jossie Ng. Iker Nuttall NP-C    02/21/2020, 10:29 AM [place] [address] Office [phone] Fax [phone]

## 2020-02-21 NOTE — Telephone Encounter (Signed)
Patient with diagnosis of afib on Xarelto for anticoagulation.    Procedure: endoscopy Date of procedure: 04/09/20  CHADS2-VASc score of 3 (HTN, DM, CAD).  CrCl >100 using adjusted body weight Platelet count 237K  Per office protocol, patient can hold Xarelto for 2 days prior to procedure as requested.

## 2020-02-21 NOTE — Telephone Encounter (Signed)
[facility] Pre-operative Risk Assessment     Request for surgical clearance:     Endoscopy Procedure  What type of surgery is being performed?     EGD/Colonoscopy  When is this surgery scheduled?     04/09/20  What type of clearance is required ?   Pharmacy  Are there any medications that need to be held prior to surgery and how long? Xarelto x 2 days  Practice name and name of physician performing surgery?      [facility]  What is your office phone and fax number?      Phone- [phone]  [phone]  Anesthesia type (None, local, MAC, general) ?       MAC

## 2020-02-21 NOTE — Progress Notes (Signed)
History of Present Illness: This is a 64 year old male referred by Eric Post, MD for the evaluation of personal history of adenomatous colon polyps maintained on anticoagulation.  He has chronic diarrhea felt secondary to Metformin.  He notes that salads exacerbate diarrhea.  He often has urgent diarrhea following meals.  He notes daily reflux symptoms since beginning Jardiance.  Symptoms generally occur within 30 minutes after meal.  He takes Tums as needed.  He notes occasional small-volume rectal bleeding that he attributes to hemorrhoids. Denies weight loss, abdominal pain, constipation, change in stool caliber, melena, nausea, vomiting, dysphagia, chest pain.   Allergies  Allergen Reactions  . Cephalosporins Anaphylaxis  . Penicillins Anaphylaxis and Other (See Comments)    Has patient had a PCN reaction causing immediate rash, facial/tongue/throat swelling, SOB or lightheadedness with hypotension: Yes Has patient had a PCN reaction causing severe rash involving mucus membranes or skin necrosis: No Has patient had a PCN reaction that required hospitalization: No - went to doctors office Has patient had a PCN reaction occurring within the last 10 years: No If all of the above answers are "NO", then may proceed with Cephalosporin use.   . Thallium Itching and Rash  . Codeine Itching  . Gabapentin Other (See Comments)    "made me feel drugged"  . Eliquis [Apixaban] Diarrhea   Outpatient Medications Prior to Visit  Medication Sig Dispense Refill  . albuterol (VENTOLIN HFA) 108 (90 Base) MCG/ACT inhaler INHALE 2 PUFFS INTO THE LUNGS EVERY 2 HOURS AS NEEDED FOR WHEEZING OR SHORTNESS OF BREATH 8.5 g 0  . atorvastatin (LIPITOR) 40 MG tablet Take 1 tablet (40 mg total) by mouth daily at 6 PM. 90 tablet 3  . diltiazem (CARDIZEM CD) 360 MG 24 hr capsule TAKE 1 CAPSULE(360 MG) BY MOUTH DAILY 90 capsule 2  . empagliflozin (JARDIANCE) 10 MG TABS tablet Take 10 mg by mouth daily.    [name]  glucose blood (ONETOUCH VERIO) test strip USE TO CHECK BLOOD SUGAR ONCE DAILY 100 each 0  . metFORMIN (GLUCOPHAGE-XR) 750 MG 24 hr tablet Take 1 tablet (750 mg total) by mouth 2 (two) times daily. NEEDS OFFICE VISIT FOR FURTHER REFILLS 60 tablet 0  . nitroGLYCERIN (NITROSTAT) 0.4 MG SL tablet DISSOLVE 1 TABLET UNDER THE TONGUE EVERY 5 MINUTES FOR 3 DOSES AS NEEDED FOR CHEST PAIN(CALL 911 IF 3RD DOSE IS TAKEN) 25 tablet 6  . ONETOUCH DELICA LANCETS FINE MISC USE TO CHECK BLOOD SUGAR ONCE DAILY 100 each 0  . pregabalin (LYRICA) 100 MG capsule Take 1 capsule in the morning, 1 capsule at noon, and 2 capsules qhs (Patient taking differently: Take 1 capsule in the morning, 1 capsule at noon, and 2 capsules qhs *Pt is only taking 2 capsules at bedtime due to side effect (drowsiness). 01/17/2020 - JHM, LPN*) 195 capsule 1  . rivaroxaban (XARELTO) 20 MG TABS tablet TAKE 1 TABLET(20 MG) BY MOUTH DAILY WITH SUPPER 90 tablet 1  . sotalol (BETAPACE) 80 MG tablet TAKE 1 TABLET(80 MG) BY MOUTH TWICE DAILY 180 tablet 1  . WIXELA INHUB 100-50 MCG/DOSE AEPB INHALE 1 PUFF INTO THE LUNGS TWICE DAILY 60 each 1  . acetaminophen (TYLENOL) 500 MG tablet Take 500 mg by mouth every 6 (six) hours as needed for moderate pain or headache.    . tadalafil (CIALIS) 20 MG tablet TAKE 1 TABLET BY MOUTH DAILY AS NEEDED FOR ERECTILE DYSFUNCTION 10 tablet 3   No facility-administered medications prior to visit.  Past Medical History:  Diagnosis Date  . Anal fissure   . Chronic bronchitis ([place])    "get it q spring and fall" (10/27/2013)  . Coronary artery disease    a. s/p PCI in 1996. b. LHC 01/2006 showed 50% mid LAD, otherwise widely patent cors with minimal irregularities. c. Nuclear stress test in 04/2009 was reportedly low risk, no ischemia. // d. Nuclear stress test 2/19: EF 55, transient ST depression in recovery, no ischemia, inf defect (artifact vs scar)   . DM type 2, uncontrolled, with neuropathy ([place])   . ED (erectile  dysfunction)   . Essential hypertension   . Hx of echocardiogram    Echo (10/2013): Mild LVH, moderate focal basal hypertrophy of the septum, EF 50-55%, no RWMA  . Hyperlipidemia   . Internal hemorrhoids without mention of complication   . Obesity   . Obesity   . OSA on CPAP   . Paresthesia of both legs   . Paroxysmal atrial fibrillation ([name])    a. failed Tikosyn, Multaq. b. s/p ablation of AF/AFL in 12/2013.  [name] Paroxysmal atrial flutter ([place])    a. s/p ablation of AF/AFL in 12/2013.  [name] Smoker    QUIT 10/13/13  . Tubular adenoma of colon 2016   Past Surgical History:  Procedure Laterality Date  . ABLATION  01-12-2014   PVI and CTI by Dr Eric Palmer  . ANAL FISSURE REPAIR  ~ 2010  . ATRIAL FIBRILLATION ABLATION N/A 01/12/2014   Procedure: ATRIAL FIBRILLATION ABLATION;  Surgeon: Eric Mark, MD;  Location: [facility];  Service: Cardiovascular;  Laterality: N/A;  . CARDIAC CATHETERIZATION  2007  . CORONARY ANGIOPLASTY WITH STENT PLACEMENT  1996   residual 50% LAD and RCA by cath in 2007  . CORONARY ANGIOPLASTY WITH STENT PLACEMENT  1995  . INGUINAL HERNIA REPAIR Right 1963  . LEFT HEART CATH AND CORONARY ANGIOGRAPHY N/A 10/11/2017   Procedure: LEFT HEART CATH AND CORONARY ANGIOGRAPHY;  Surgeon: Eric Crome, MD;  Location: [facility];  Service: Cardiovascular;  Laterality: N/A;  . TEE WITHOUT CARDIOVERSION N/A 01/11/2014   Procedure: TRANSESOPHAGEAL ECHOCARDIOGRAM (TEE);  Surgeon: Eric Headings, MD;  Location: [facility];  Service: Cardiovascular;  Laterality: N/A;  . TONSILLECTOMY AND ADENOIDECTOMY  1979   Social History   Socioeconomic History  . Marital status: Married    Spouse name: Not on file  . Number of children: Not on file  . Years of education: Not on file  . Highest education level: Not on file  Occupational History  . Not on file  Tobacco Use  . Smoking status: Former Smoker    Packs/day: 1.00    Years: 43.00    Pack years: 43.00    Types:  Cigarettes    Quit date: 2020    Years since quitting: 1.4  . Smokeless tobacco: Never Used  Vaping Use  . Vaping Use: Never used  Substance and Sexual Activity  . Alcohol use: Yes    Alcohol/week: 0.0 standard drinks    Comment: 10/27/2013 "might have 1 beer/month"  . Drug use: No  . Sexual activity: Yes  Other Topics Concern  . Not on file  Social History Narrative   Works as an Programme researcher, broadcasting/film/video for [employer] Strain:   . Difficulty of Paying Living Expenses:   Food Insecurity:   . Worried About Charity fundraiser in the Last Year:   . Ran  Out of Food in the Last Year:   Transportation Needs:   . Lack of Transportation (Medical):   [name] Lack of Transportation (Non-Medical):   Physical Activity:   . Days of Exercise per Week:   . Minutes of Exercise per Session:   Stress:   . Feeling of Stress :   Social Connections:   . Frequency of Communication with Friends and Family:   . Frequency of Social Gatherings with Friends and Family:   . Attends Religious Services:   . Active Member of Clubs or Organizations:   . Attends Archivist Meetings:   [name] Marital Status:    Family History  Problem Relation Age of Onset  . Heart disease Mother 67       Arrhythmia  . Hypertension Mother   . Heart disease Father 59       died 28 CHF  . Heart attack Father   . Hyperlipidemia Other   . Heart disease Other   . Stroke Neg Hx   . Colon cancer Neg Hx   . Esophageal cancer Neg Hx   . Liver disease Neg Hx   . Pancreatic cancer Neg Hx   . Stomach cancer Neg Hx        Review of Systems: Pertinent positive and negative review of systems were noted in the above HPI section. All other review of systems were otherwise negative.   Physical Exam: General: Well developed, well nourished, no acute distress Head: Normocephalic and atraumatic Eyes:  sclerae anicteric, EOMI Ears: Normal auditory acuity Mouth: Not examined, mask on  during Covid-19 pandemic Neck: Supple, no masses or thyromegaly Lungs: Clear throughout to auscultation Heart: Regular rate and rhythm; no murmurs, rubs or bruits Abdomen: Soft, non tender and non distended. No masses, hepatosplenomegaly or hernias noted. Normal Bowel sounds Rectal: Deferred to colonoscopy Musculoskeletal: Symmetrical with no gross deformities  Skin: No lesions on visible extremities Pulses:  Normal pulses noted Extremities: No clubbing, cyanosis, edema or deformities noted Neurological: Alert oriented x 4, grossly nonfocal Cervical Nodes:  No significant cervical adenopathy Inguinal Nodes: No significant inguinal adenopathy Psychological:  Alert and cooperative. Normal mood and affect   Assessment and Recommendations:  1.  Personal history of adenomatous colon polyps due for surveillance colonoscopy.  Schedule colonoscopy. The risks (including bleeding, perforation, infection, missed lesions, medication reactions and possible hospitalization or surgery if complications occur), benefits, and alternatives to colonoscopy with possible biopsy and possible polypectomy were discussed with the patient and they consent to proceed.   2. GERD.  R/O esophagitis. Follow antireflux measures.  Begin pantoprazole 40 mg daily.  Tums as needed. Schedule EGD. The risks (including bleeding, perforation, infection, missed lesions, medication reactions and possible hospitalization or surgery if complications occur), benefits, and alternatives to endoscopy with possible biopsy and possible dilation were discussed with the patient and they consent to proceed.   3.  Diarrhea.  Likely secondary to Metformin.  Further evaluation at colonoscopy.  He is advised to discuss a 1 to 2-week hold of metformin with his PCP and if Metformin is the cause of diarrhea consider alternative medications.  4. A. fib.  Hold Xarelto 2 days before procedure - will instruct when and how to resume after procedure. Low but  real risk of cardiovascular event such as heart attack, stroke, embolism, thrombosis or ischemia/infarct of other organs off Xarelto explained and need to seek urgent help if this occurs. The patient consents to proceed. Will communicate by phone or EMR with patient's  prescribing provider to confirm that holding Xarelto is reasonable in this case.    cc: Eric Post, MD [address] [place],  [place] [address]

## 2020-02-21 NOTE — Telephone Encounter (Signed)
Informed patient to hold Xarelto 2 days prior to his procedure per Cardiology. Patient verbalized understanding.

## 2020-02-21 NOTE — Patient Instructions (Signed)
We have sent the following medications to your pharmacy for you to pick up at your convenience: pantoprazole.   Patient advised to avoid spicy, acidic, citrus, chocolate, mints, fruit and fruit juices.  Limit the intake of caffeine, alcohol and Soda.  Don't exercise too soon after eating.  Don't lie down within 3-4 hours of eating.  Elevate the head of your bed.  You have been scheduled for an endoscopy and colonoscopy. Please follow the written instructions given to you at your visit today. Please pick up your prep supplies at the pharmacy within the next 1-3 days. If you use inhalers (even only as needed), please bring them with you on the day of your procedure.  Thank you for choosing me and [facility].  Pricilla Riffle. Dagoberto Ligas., MD., Marval Regal

## 2020-03-04 ENCOUNTER — Telehealth: Payer: Self-pay | Admitting: Gastroenterology

## 2020-03-04 NOTE — Telephone Encounter (Signed)
Called patient's pharmacy [facility]) to see if they ran the [place] coupon code that comes with the prescription. Pharmacist had not and ran the coupon. The prescription is now $40. Called patient and left message for him to call our office back.

## 2020-03-05 NOTE — Telephone Encounter (Signed)
Informed patient that he can go by and pick up the prep for $40 at his pharmacy. Patient verbalized understanding.

## 2020-03-07 ENCOUNTER — Other Ambulatory Visit: Payer: Self-pay | Admitting: Family Medicine

## 2020-03-27 ENCOUNTER — Encounter: Payer: Self-pay | Admitting: Gastroenterology

## 2020-04-06 ENCOUNTER — Other Ambulatory Visit: Payer: Self-pay | Admitting: Family Medicine

## 2020-04-09 ENCOUNTER — Encounter: Payer: BC Managed Care – PPO | Admitting: Gastroenterology

## 2020-04-19 ENCOUNTER — Other Ambulatory Visit: Payer: Self-pay | Admitting: Family Medicine

## 2020-04-20 ENCOUNTER — Other Ambulatory Visit: Payer: Self-pay | Admitting: Family Medicine

## 2020-05-07 ENCOUNTER — Other Ambulatory Visit: Payer: Self-pay | Admitting: Family Medicine

## 2020-05-13 ENCOUNTER — Other Ambulatory Visit: Payer: Self-pay

## 2020-05-13 MED ORDER — ATORVASTATIN CALCIUM 40 MG PO TABS: 40.0000 mg | ORAL_TABLET | Freq: Every day | ORAL | 2 refills | Status: DC

## 2020-05-27 ENCOUNTER — Other Ambulatory Visit: Payer: Self-pay | Admitting: *Deleted

## 2020-05-27 DIAGNOSIS — F17210 Nicotine dependence, cigarettes, uncomplicated: Secondary | ICD-10-CM

## 2020-06-08 ENCOUNTER — Other Ambulatory Visit: Payer: Self-pay | Admitting: Interventional Cardiology

## 2020-06-08 ENCOUNTER — Other Ambulatory Visit: Payer: Self-pay | Admitting: Family Medicine

## 2020-06-10 ENCOUNTER — Ambulatory Visit
Admission: RE | Admit: 2020-06-10 | Discharge: 2020-06-10 | Disposition: A | Discharge status: Home / Self Care | Payer: BC Managed Care – PPO | Source: Ambulatory Visit | Attending: Acute Care | Admitting: Acute Care

## 2020-06-10 DIAGNOSIS — Z87891 Personal history of nicotine dependence: Secondary | ICD-10-CM | POA: Diagnosis not present

## 2020-06-10 DIAGNOSIS — J432 Centrilobular emphysema: Secondary | ICD-10-CM | POA: Diagnosis not present

## 2020-06-10 DIAGNOSIS — I700 Atherosclerosis of aorta: Secondary | ICD-10-CM | POA: Diagnosis not present

## 2020-06-10 DIAGNOSIS — I2510 Atherosclerotic heart disease of native coronary artery without angina pectoris: Secondary | ICD-10-CM | POA: Diagnosis not present

## 2020-06-10 DIAGNOSIS — F17210 Nicotine dependence, cigarettes, uncomplicated: Secondary | ICD-10-CM

## 2020-06-13 NOTE — Progress Notes (Signed)

## 2020-06-14 ENCOUNTER — Other Ambulatory Visit: Payer: Self-pay | Admitting: *Deleted

## 2020-06-14 DIAGNOSIS — Z87891 Personal history of nicotine dependence: Secondary | ICD-10-CM

## 2020-06-23 ENCOUNTER — Other Ambulatory Visit: Payer: Self-pay | Admitting: Family Medicine

## 2020-06-27 DIAGNOSIS — H2513 Age-related nuclear cataract, bilateral: Secondary | ICD-10-CM | POA: Diagnosis not present

## 2020-06-27 DIAGNOSIS — H25013 Cortical age-related cataract, bilateral: Secondary | ICD-10-CM | POA: Diagnosis not present

## 2020-06-27 DIAGNOSIS — H35033 Hypertensive retinopathy, bilateral: Secondary | ICD-10-CM | POA: Diagnosis not present

## 2020-06-27 DIAGNOSIS — E119 Type 2 diabetes mellitus without complications: Secondary | ICD-10-CM | POA: Diagnosis not present

## 2020-06-27 LAB — HM DIABETES EYE EXAM

## 2020-07-01 ENCOUNTER — Ambulatory Visit (INDEPENDENT_AMBULATORY_CARE_PROVIDER_SITE_OTHER): Payer: BC Managed Care – PPO | Admitting: Family Medicine

## 2020-07-01 ENCOUNTER — Encounter: Payer: Self-pay | Admitting: Family Medicine

## 2020-07-01 ENCOUNTER — Other Ambulatory Visit: Payer: Self-pay

## 2020-07-01 VITALS — BP 116/80 | HR 73 | Temp 98.3°F | Ht 73.0 in | Wt 245.3 lb

## 2020-07-01 DIAGNOSIS — Z23 Encounter for immunization: Secondary | ICD-10-CM | POA: Diagnosis not present

## 2020-07-01 DIAGNOSIS — E1142 Type 2 diabetes mellitus with diabetic polyneuropathy: Secondary | ICD-10-CM

## 2020-07-01 DIAGNOSIS — Z0000 Encounter for general adult medical examination without abnormal findings: Secondary | ICD-10-CM

## 2020-07-01 LAB — POCT GLYCOSYLATED HEMOGLOBIN (HGB A1C)
HbA1c POC (<> result, manual entry): 7 % (ref 4.0–5.6)
HbA1c, POC (controlled diabetic range): 7 % (ref 0.0–7.0)
HbA1c, POC (prediabetic range): 7 % — AB (ref 5.7–6.4)
Hemoglobin A1C: 7 % — AB (ref 4.0–5.6)

## 2020-07-01 MED ORDER — FLUTICASONE-SALMETEROL 100-50 MCG/DOSE IN AEPB
INHALATION_SPRAY | RESPIRATORY_TRACT | 11 refills | Status: DC
Start: 2020-07-01 — End: 2020-07-25

## 2020-07-01 MED ORDER — PANTOPRAZOLE SODIUM 40 MG PO TBEC
40.0000 mg | DELAYED_RELEASE_TABLET | Freq: Every day | ORAL | 3 refills | Status: DC
Start: 2020-07-01 — End: 2021-04-23

## 2020-07-01 MED ORDER — METFORMIN HCL ER 750 MG PO TB24
ORAL_TABLET | ORAL | 3 refills | Status: DC
Start: 2020-07-01 — End: 2021-07-07

## 2020-07-01 MED ORDER — ALBUTEROL SULFATE HFA 108 (90 BASE) MCG/ACT IN AERS: INHALATION_SPRAY | RESPIRATORY_TRACT | 1 refills | Status: DC

## 2020-07-01 MED ORDER — EMPAGLIFLOZIN 10 MG PO TABS
10.0000 mg | ORAL_TABLET | Freq: Every day | ORAL | 3 refills | Status: DC
Start: 2020-07-01 — End: 2021-04-18

## 2020-07-01 NOTE — Progress Notes (Signed)
Established Patient Office Visit  Subjective:  Patient ID: Eric Palmer, male    DOB: 1956/06/23  Age: 64 y.o. MRN: [id number]  CC:  Chief Complaint  Patient presents with  . Annual Exam    physical and need refill on meds    HPI Eric Palmer presents for physical exam.  He has multiple chronic problems including history of CAD, atrial fibrillation, hypertension, type 2 diabetes, COPD, obstructive sleep apnea, diabetic peripheral neuropathy, hyperlipidemia.  He needs refills of several regular medications today including Metformin, Protonix, Jardiance, albuterol, and Wixela inhaler  Health maintenance reviewed  -Flu vaccine needed at this time -Prior hepatitis C screen negative -Tetanus due 2023 -Eye exam up-to-date -Was due for repeat colonoscopy 4/21 but insurance would not cover for diagnostic colonoscopy at that time.  He has had prior polyps. -Needs urine microalbumin screen -Has had J&J Covid vaccine but does not have dates  Family history and past medical history reviewed and unchanged  Social history-he is married.  Smoked for years-quit 2020.  Took on a new job 2019 and his new job does require some travel.  Past Medical History:  Diagnosis Date  . Anal fissure   . Chronic bronchitis ([place])    "get it q spring and fall" (10/27/2013)  . Coronary artery disease    a. s/p PCI in 1996. b. LHC 01/2006 showed 50% mid LAD, otherwise widely patent cors with minimal irregularities. c. Nuclear stress test in 04/2009 was reportedly low risk, no ischemia. // d. Nuclear stress test 2/19: EF 55, transient ST depression in recovery, no ischemia, inf defect (artifact vs scar)   . DM type 2, uncontrolled, with neuropathy ([place])   . ED (erectile dysfunction)   . Essential hypertension   . Hx of echocardiogram    Echo (10/2013): Mild LVH, moderate focal basal hypertrophy of the septum, EF 50-55%, no RWMA  . Hyperlipidemia   . Internal hemorrhoids without mention of complication     . Obesity   . Obesity   . OSA on CPAP   . Paresthesia of both legs   . Paroxysmal atrial fibrillation ([name])    a. failed Tikosyn, Multaq. b. s/p ablation of AF/AFL in 12/2013.  [name] Paroxysmal atrial flutter ([place])    a. s/p ablation of AF/AFL in 12/2013.  [name] Smoker    QUIT 10/13/13  . Tubular adenoma of colon 2016    Past Surgical History:  Procedure Laterality Date  . ABLATION  01-12-2014   PVI and CTI by Dr [name]  . ANAL FISSURE REPAIR  ~ 2010  . ATRIAL FIBRILLATION ABLATION N/A 01/12/2014   Procedure: ATRIAL FIBRILLATION ABLATION;  Surgeon: Coralyn Mark, MD;  Location: [facility];  Service: Cardiovascular;  Laterality: N/A;  . CARDIAC CATHETERIZATION  2007  . CORONARY ANGIOPLASTY WITH STENT PLACEMENT  1996   residual 50% LAD and RCA by cath in 2007  . CORONARY ANGIOPLASTY WITH STENT PLACEMENT  1995  . INGUINAL HERNIA REPAIR Right 1963  . LEFT HEART CATH AND CORONARY ANGIOGRAPHY N/A 10/11/2017   Procedure: LEFT HEART CATH AND CORONARY ANGIOGRAPHY;  Surgeon: [name], MD;  Location: [facility];  Service: Cardiovascular;  Laterality: N/A;  . TEE WITHOUT CARDIOVERSION N/A 01/11/2014   Procedure: TRANSESOPHAGEAL ECHOCARDIOGRAM (TEE);  Surgeon: [name], MD;  Location: [facility];  Service: Cardiovascular;  Laterality: N/A;  . TONSILLECTOMY AND ADENOIDECTOMY  1979    Family History  Problem Relation Age of Onset  .  Heart disease Mother 36       Arrhythmia  . Hypertension Mother   . Heart disease Father 6       died 14 CHF  . Heart attack Father   . Hyperlipidemia Other   . Heart disease Other   . Stroke Neg Hx   . Colon cancer Neg Hx   . Esophageal cancer Neg Hx   . Liver disease Neg Hx   . Pancreatic cancer Neg Hx   . Stomach cancer Neg Hx     Social History   Socioeconomic History  . Marital status: Married    Spouse name: Not on file  . Number of children: Not on file  . Years of education: Not on file  . Highest education level: Not on file   Occupational History  . Not on file  Tobacco Use  . Smoking status: Former Smoker    Packs/day: 1.00    Years: 43.00    Pack years: 43.00    Types: Cigarettes    Quit date: 2020    Years since quitting: 1.8  . Smokeless tobacco: Never Used  Vaping Use  . Vaping Use: Never used  Substance and Sexual Activity  . Alcohol use: Yes    Alcohol/week: 0.0 standard drinks    Comment: 10/27/2013 "might have 1 beer/month"  . Drug use: No  . Sexual activity: Yes  Other Topics Concern  . Not on file  Social History Narrative   Works as an Programme researcher, broadcasting/film/video for [employer] Strain:   . Difficulty of Paying Living Expenses: Not on file  Food Insecurity:   . Worried About Charity fundraiser in the Last Year: Not on file  . Ran Out of Food in the Last Year: Not on file  Transportation Needs:   . Lack of Transportation (Medical): Not on file  . Lack of Transportation (Non-Medical): Not on file  Physical Activity:   . Days of Exercise per Week: Not on file  . Minutes of Exercise per Session: Not on file  Stress:   . Feeling of Stress : Not on file  Social Connections:   . Frequency of Communication with Friends and Family: Not on file  . Frequency of Social Gatherings with Friends and Family: Not on file  . Attends Religious Services: Not on file  . Active Member of Clubs or Organizations: Not on file  . Attends Archivist Meetings: Not on file  . Marital Status: Not on file  Intimate Partner Violence:   . Fear of Current or Ex-Partner: Not on file  . Emotionally Abused: Not on file  . Physically Abused: Not on file  . Sexually Abused: Not on file    Outpatient Medications Prior to Visit  Medication Sig Dispense Refill  . albuterol (VENTOLIN HFA) 108 (90 Base) MCG/ACT inhaler INHALE 2 PUFFS INTO THE LUNGS EVERY 2 HOURS AS NEEDED FOR WHEEZING OR SHORTNESS OF BREATH 8.5 g 0  . atorvastatin (LIPITOR) 40 MG tablet Take 1 tablet (40  mg total) by mouth daily at 6 PM. 90 tablet 2  . diltiazem (CARDIZEM CD) 360 MG 24 hr capsule TAKE 1 CAPSULE(360 MG) BY MOUTH DAILY 90 capsule 2  . empagliflozin (JARDIANCE) 10 MG TABS tablet Take 10 mg by mouth daily.    [name] glucose blood (ONETOUCH VERIO) test strip USE TO CHECK BLOOD SUGAR ONCE DAILY 100 each 0  . metFORMIN (GLUCOPHAGE-XR) 750 MG 24 hr  tablet TAKE 1 TABLET(750 MG) BY MOUTH TWICE DAILY 60 tablet 0  . nitroGLYCERIN (NITROSTAT) 0.4 MG SL tablet DISSOLVE 1 TABLET UNDER THE TONGUE EVERY 5 MINUTES FOR 3 DOSES AS NEEDED FOR CHEST PAIN(CALL 911 IF 3RD DOSE IS TAKEN) 25 tablet 6  . NON FORMULARY [facility]  Pain Cream -- Ketamine 5%; Baclofen 2%; Gabapentin 5%; Lidocaine 5%; Menthol 1% 200 gm with 3 refills  Was faxed to [facility] on 02/21/20    . ONETOUCH DELICA LANCETS FINE MISC USE TO CHECK BLOOD SUGAR ONCE DAILY 100 each 0  . pantoprazole (PROTONIX) 40 MG tablet Take 1 tablet (40 mg total) by mouth daily. 30 tablet 11  . pregabalin (LYRICA) 100 MG capsule Take 1 capsule in the morning, 1 capsule at noon, and 2 capsules qhs (Patient taking differently: Take 1 capsule in the morning, 1 capsule at noon, and 2 capsules qhs *Pt is only taking 2 capsules at bedtime due to side effect (drowsiness). 01/17/2020 - JHM, LPN*) 401 capsule 1  . rivaroxaban (XARELTO) 20 MG TABS tablet TAKE 1 TABLET(20 MG) BY MOUTH DAILY WITH SUPPER 90 tablet 1  . Sodium Sulfate-Mag Sulfate-KCl (SUTAB) [phone] MG TABS Take 1 kit by mouth as directed. BIN: [id number] PCN: CN GROUP: [id number] MEMBER ID: [id number];YSA AS CASH;NO PRIOR AUTHORIZATION 24 tablet 0  . sotalol (BETAPACE) 80 MG tablet Take 1 tablet (80 mg total) by mouth 2 (two) times daily. Please call our office and schedule your follow up for December 2021 - [phone] 180 tablet 0  . traMADol (ULTRAM) 50 MG tablet Take 1 tablet (50 mg total) by mouth 3 (three) times daily. 90 tablet 2  . WIXELA INHUB 100-50 MCG/DOSE AEPB INHALE 1  PUFF INTO THE LUNGS TWICE DAILY 60 each 1   No facility-administered medications prior to visit.    Allergies  Allergen Reactions  . Cephalosporins Anaphylaxis  . Penicillins Anaphylaxis and Other (See Comments)    Has patient had a PCN reaction causing immediate rash, facial/tongue/throat swelling, SOB or lightheadedness with hypotension: Yes Has patient had a PCN reaction causing severe rash involving mucus membranes or skin necrosis: No Has patient had a PCN reaction that required hospitalization: No - went to doctors office Has patient had a PCN reaction occurring within the last 10 years: No If all of the above answers are "NO", then may proceed with Cephalosporin use.   . Thallium Itching and Rash  . Codeine Itching  . Gabapentin Other (See Comments)    "made me feel drugged"  . Eliquis [Apixaban] Diarrhea    ROS Review of Systems  Constitutional: Negative for fatigue.  Eyes: Negative for visual disturbance.  Respiratory: Negative for cough, chest tightness and shortness of breath.   Cardiovascular: Negative for chest pain, palpitations and leg swelling.  Gastrointestinal: Negative for abdominal pain.  Endocrine: Negative for polydipsia and polyuria.  Genitourinary: Negative for dysuria.  Neurological: Negative for dizziness, syncope, weakness, light-headedness and headaches.  Hematological: Negative for adenopathy.      Objective:    Physical Exam Constitutional:      Appearance: He is well-developed.  HENT:     Right Ear: External ear normal.     Left Ear: External ear normal.  Eyes:     Pupils: Pupils are equal, round, and reactive to light.  Neck:     Thyroid: No thyromegaly.  Cardiovascular:     Rate and Rhythm: Normal rate and regular rhythm.  Pulmonary:     Effort: Pulmonary effort is  normal. No respiratory distress.     Breath sounds: Normal breath sounds. No wheezing or rales.  Abdominal:     Palpations: Abdomen is soft.     Tenderness: There is  no abdominal tenderness. There is no guarding or rebound.  Musculoskeletal:     Cervical back: Neck supple.     Right lower leg: No edema.     Left lower leg: No edema.  Skin:    Findings: No rash.  Neurological:     Mental Status: He is alert and oriented to person, place, and time.  Psychiatric:        Mood and Affect: Mood normal.        Thought Content: Thought content normal.     BP 116/80 (BP Location: Left Arm, Patient Position: Sitting, Cuff Size: Large)   Pulse 73   Temp 98.3 F (36.8 C) (Oral)   Ht '6\' 1"'  (1.854 m)   Wt 245 lb 4.8 oz (111.3 kg)   SpO2 95%   BMI 32.36 kg/m  Wt Readings from Last 3 Encounters:  07/01/20 245 lb 4.8 oz (111.3 kg)  02/21/20 248 lb (112.5 kg)  11/29/19 251 lb (113.9 kg)     Health Maintenance Due  Topic Date Due  . COVID-19 Vaccine (1) Never done  . URINE MICROALBUMIN  08/13/2019  . COLONOSCOPY  12/17/2019    There are no preventive care reminders to display for this patient.  Lab Results  Component Value Date   TSH 2.363 08/15/2017   Lab Results  Component Value Date   WBC 8.4 06/29/2019   HGB 16.1 06/29/2019   HCT 46.4 06/29/2019   MCV 90 06/29/2019   PLT 237 06/29/2019   Lab Results  Component Value Date   NA 140 06/29/2019   K 4.6 06/29/2019   CO2 24 06/29/2019   GLUCOSE 161 (H) 06/29/2019   BUN 12 06/29/2019   CREATININE 0.98 06/29/2019   BILITOT 0.4 06/29/2019   ALKPHOS 71 06/29/2019   AST 19 06/29/2019   ALT 37 06/29/2019   PROT 6.9 06/29/2019   ALBUMIN 4.4 06/29/2019   CALCIUM 9.5 06/29/2019   ANIONGAP 7 08/15/2017   GFR 94.63 08/12/2018   Lab Results  Component Value Date   CHOL 130 06/29/2019   Lab Results  Component Value Date   HDL 35 (L) 06/29/2019   Lab Results  Component Value Date   LDLCALC 35 06/29/2019   Lab Results  Component Value Date   TRIG 419 (H) 06/29/2019   Lab Results  Component Value Date   CHOLHDL 3.7 06/29/2019   Lab Results  Component Value Date   HGBA1C 7.0  (A) 07/01/2020   HGBA1C 7.0 07/01/2020   HGBA1C 7.0 (A) 07/01/2020   HGBA1C 7.0 07/01/2020      Assessment & Plan:   Physical exam.  He has multiple chronic problems as above.  We addressed several items as follows  -Flu vaccine given -Recommend Prevnar 13 at 23 followed by 1 more Pneumovax -Discussed Shingrix vaccine but he declines at this time -Obtain follow-up labs.  Include urine microalbumin screen. -Refill several medications including Metformin, Wixela's, Protonix, Jardiance, albuterol -Continue with yearly eye exams -He is due for follow-up colonoscopy but declines until he goes under Medicare -A1c today 7.0% which is stable  No orders of the defined types were placed in this encounter.   Follow-up: Return in about 6 months (around 12/29/2020).    Carolann Littler, MD

## 2020-07-01 NOTE — Patient Instructions (Signed)
Preventive Care 41-63 Years Old, Male Preventive care refers to lifestyle choices and visits with your health care provider that can promote health and wellness. This includes:  A yearly physical exam. This is also called an annual well check.  Regular dental and eye exams.  Immunizations.  Screening for certain conditions.  Healthy lifestyle choices, such as eating a healthy diet, getting regular exercise, not using drugs or products that contain nicotine and tobacco, and limiting alcohol use. What can I expect for my preventive care visit? Physical exam Your health care provider will check:  Height and weight. These may be used to calculate body mass index (BMI), which is a measurement that tells if you are at a healthy weight.  Heart rate and blood pressure.  Your skin for abnormal spots. Counseling Your health care provider may ask you questions about:  Alcohol, tobacco, and drug use.  Emotional well-being.  Home and relationship well-being.  Sexual activity.  Eating habits.  Work and work Statistician. What immunizations do I need?  Influenza (flu) vaccine  This is recommended every year. Tetanus, diphtheria, and pertussis (Tdap) vaccine  You may need a Td booster every 10 years. Varicella (chickenpox) vaccine  You may need this vaccine if you have not already been vaccinated. Zoster (shingles) vaccine  You may need this after age 64. Measles, mumps, and rubella (MMR) vaccine  You may need at least one dose of MMR if you were born in 1957 or later. You may also need a second dose. Pneumococcal conjugate (PCV13) vaccine  You may need this if you have certain conditions and were not previously vaccinated. Pneumococcal polysaccharide (PPSV23) vaccine  You may need one or two doses if you smoke cigarettes or if you have certain conditions. Meningococcal conjugate (MenACWY) vaccine  You may need this if you have certain conditions. Hepatitis A  vaccine  You may need this if you have certain conditions or if you travel or work in places where you may be exposed to hepatitis A. Hepatitis B vaccine  You may need this if you have certain conditions or if you travel or work in places where you may be exposed to hepatitis B. Haemophilus influenzae type b (Hib) vaccine  You may need this if you have certain risk factors. Human papillomavirus (HPV) vaccine  If recommended by your health care provider, you may need three doses over 6 months. You may receive vaccines as individual doses or as more than one vaccine together in one shot (combination vaccines). Talk with your health care provider about the risks and benefits of combination vaccines. What tests do I need? Blood tests  Lipid and cholesterol levels. These may be checked every 5 years, or more frequently if you are over 60 years old.  Hepatitis C test.  Hepatitis B test. Screening  Lung cancer screening. You may have this screening every year starting at age 43 if you have a 30-pack-year history of smoking and currently smoke or have quit within the past 15 years.  Prostate cancer screening. Recommendations will vary depending on your family history and other risks.  Colorectal cancer screening. All adults should have this screening starting at age 72 and continuing until age 2. Your health care provider may recommend screening at age 14 if you are at increased risk. You will have tests every 1-10 years, depending on your results and the type of screening test.  Diabetes screening. This is done by checking your blood sugar (glucose) after you have not eaten  for a while (fasting). You may have this done every 1-3 years.  Sexually transmitted disease (STD) testing. Follow these instructions at home: Eating and drinking  Eat a diet that includes fresh fruits and vegetables, whole grains, lean protein, and low-fat dairy products.  Take vitamin and mineral supplements as  recommended by your health care provider.  Do not drink alcohol if your health care provider tells you not to drink.  If you drink alcohol: ? Limit how much you have to 0-2 drinks a day. ? Be aware of how much alcohol is in your drink. In the [place] one drink equals one 12 oz bottle of beer (355 mL), one 5 oz glass of wine (148 mL), or one 1 oz glass of hard liquor (44 mL). Lifestyle  Take daily care of your teeth and gums.  Stay active. Exercise for at least 30 minutes on 5 or more days each week.  Do not use any products that contain nicotine or tobacco, such as cigarettes, e-cigarettes, and chewing tobacco. If you need help quitting, ask your health care provider.  If you are sexually active, practice safe sex. Use a condom or other form of protection to prevent STIs (sexually transmitted infections).  Talk with your health care provider about taking a low-dose aspirin every day starting at age 53. What's next?  Go to your health care provider once a year for a well check visit.  Ask your health care provider how often you should have your eyes and teeth checked.  Stay up to date on all vaccines. This information is not intended to replace advice given to you by your health care provider. Make sure you discuss any questions you have with your health care provider. Document Revised: 08/04/2018 Document Reviewed: 08/04/2018 Elsevier Patient Education  2020 [employer].

## 2020-07-02 LAB — CBC WITH DIFFERENTIAL/PLATELET
Absolute Monocytes: 855 cells/uL (ref 200–950)
Basophils Absolute: 69 cells/uL (ref 0–200)
Basophils Relative: 0.9 %
Eosinophils Absolute: 339 cells/uL (ref 15–500)
Eosinophils Relative: 4.4 %
HCT: 50.8 % — ABNORMAL HIGH (ref 38.5–50.0)
Hemoglobin: 17.5 g/dL — ABNORMAL HIGH (ref 13.2–17.1)
Lymphs Abs: 2094 cells/uL (ref 850–3900)
MCH: 30.6 pg (ref 27.0–33.0)
MCHC: 34.4 g/dL (ref 32.0–36.0)
MCV: 88.8 fL (ref 80.0–100.0)
MPV: 9.9 fL (ref 7.5–12.5)
Monocytes Relative: 11.1 %
Neutro Abs: 4343 cells/uL (ref 1500–7800)
Neutrophils Relative %: 56.4 %
Platelets: 267 10*3/uL (ref 140–400)
RBC: 5.72 10*6/uL (ref 4.20–5.80)
RDW: 12.4 % (ref 11.0–15.0)
Total Lymphocyte: 27.2 %
WBC: 7.7 10*3/uL (ref 3.8–10.8)

## 2020-07-02 LAB — HEPATIC FUNCTION PANEL
AG Ratio: 1.6 (calc) (ref 1.0–2.5)
ALT: 25 U/L (ref 9–46)
AST: 17 U/L (ref 10–35)
Albumin: 4.4 g/dL (ref 3.6–5.1)
Alkaline phosphatase (APISO): 60 U/L (ref 35–144)
Bilirubin, Direct: 0.1 mg/dL (ref 0.0–0.2)
Globulin: 2.8 g/dL (calc) (ref 1.9–3.7)
Indirect Bilirubin: 0.6 mg/dL (calc) (ref 0.2–1.2)
Total Bilirubin: 0.7 mg/dL (ref 0.2–1.2)
Total Protein: 7.2 g/dL (ref 6.1–8.1)

## 2020-07-02 LAB — BASIC METABOLIC PANEL
BUN: 18 mg/dL (ref 7–25)
CO2: 25 mmol/L (ref 20–32)
Calcium: 9.6 mg/dL (ref 8.6–10.3)
Chloride: 102 mmol/L (ref 98–110)
Creat: 1.23 mg/dL (ref 0.70–1.25)
Glucose, Bld: 164 mg/dL — ABNORMAL HIGH (ref 65–99)
Potassium: 4.4 mmol/L (ref 3.5–5.3)
Sodium: 139 mmol/L (ref 135–146)

## 2020-07-02 LAB — MICROALBUMIN / CREATININE URINE RATIO
Creatinine, Urine: 69 mg/dL (ref 20–320)
Microalb Creat Ratio: 26 mcg/mg creat (ref <30)
Microalb, Ur: 1.8 mg/dL

## 2020-07-02 LAB — LIPID PANEL
Cholesterol: 132 mg/dL (ref <200)
HDL: 36 mg/dL — ABNORMAL LOW (ref >=40)
Non-HDL Cholesterol (Calc): 96 mg/dL (calc) (ref <130)
Total CHOL/HDL Ratio: 3.7 (calc) (ref <5.0)
Triglycerides: 504 mg/dL — ABNORMAL HIGH (ref <150)

## 2020-07-02 LAB — PSA: PSA: 0.44 ng/mL (ref <=4.0)

## 2020-07-02 LAB — TSH: TSH: 1.13 mIU/L (ref 0.40–4.50)

## 2020-07-04 ENCOUNTER — Other Ambulatory Visit: Payer: Self-pay

## 2020-07-24 ENCOUNTER — Other Ambulatory Visit: Payer: Self-pay | Admitting: Family Medicine

## 2020-08-14 ENCOUNTER — Telehealth: Payer: Self-pay | Admitting: Interventional Cardiology

## 2020-08-14 MED ORDER — DILTIAZEM HCL ER COATED BEADS 360 MG PO CP24
ORAL_CAPSULE | ORAL | 1 refills | Status: DC
Start: 2020-08-14 — End: 2021-01-30

## 2020-08-14 NOTE — Telephone Encounter (Signed)
Pt's medication was sent to pt's pharmacy as requested. Confirmation received.  °

## 2020-08-14 NOTE — Telephone Encounter (Signed)
*  STAT* If patient is at the pharmacy, call can be transferred to refill team.   1. Which medications need to be refilled? (please list name of each medication and dose if known) diltiazem (CARDIZEM CD) 360 MG 24 hr capsule  2. Which pharmacy/location (including street and city if local pharmacy) is medication to be sent to? [id number] - [place], [place] - [address]  3. Do they need a 30 day or 90 day supply? 90    Patient only has one day left.

## 2020-09-01 ENCOUNTER — Other Ambulatory Visit: Payer: Self-pay | Admitting: Podiatry

## 2020-09-01 NOTE — Telephone Encounter (Signed)
Please advise 

## 2020-09-02 ENCOUNTER — Other Ambulatory Visit: Payer: Self-pay | Admitting: Podiatry

## 2020-09-02 MED ORDER — TRAMADOL HCL 50 MG PO TABS: 50.0000 mg | ORAL_TABLET | Freq: Three times a day (TID) | ORAL | 2 refills | Status: DC

## 2020-09-02 NOTE — Telephone Encounter (Signed)
Prescription is here for pickup

## 2020-09-10 ENCOUNTER — Other Ambulatory Visit: Payer: Self-pay | Admitting: Interventional Cardiology

## 2020-09-10 ENCOUNTER — Other Ambulatory Visit: Payer: Self-pay | Admitting: Family Medicine

## 2020-09-17 ENCOUNTER — Other Ambulatory Visit: Payer: Self-pay | Admitting: Podiatry

## 2020-09-19 NOTE — Progress Notes (Signed)
Cardiology Office Note:    Date:  09/20/2020   ID:  Eric Palmer, DOB 02-11-1956, MRN [id number]  PCP:  [name], MD  Cardiologist:  [name], MD   Referring MD: [name], MD   Chief Complaint  Patient presents with  . Coronary Artery Disease  . Atrial Fibrillation  . Hyperlipidemia  . Follow-up    Diabetes mellitus Family history of abdominal aortic aneurysm     History of Present Illness:    Eric Palmer is a 65 y.o. male with a hx of CAD s/p RCA stent 1996, PAF, DM, HTN, chronic anticoagulation, obesity, HLD, and OSA.  He is not having angina.  He is concerned about the expense of his insurance.  He stopped taking Vascepa because he thought it was causing diarrhea.  Continued having the symptoms even after stopping the medication.  He was placed on Protonix and the symptoms have resolved.  He is not having angina.  He has A. fib 2-3 times per month.  He feels that his heart is racing and irregular.  It causes him to feel weak.  He has a family history of ruptured abdominal aortic aneurysm, his mother.  Past Medical History:  Diagnosis Date  . Anal fissure   . Chronic bronchitis ([place])    "get it q spring and fall" (10/27/2013)  . Coronary artery disease    a. s/p PCI in 1996. b. LHC 01/2006 showed 50% mid LAD, otherwise widely patent cors with minimal irregularities. c. Nuclear stress test in 04/2009 was reportedly low risk, no ischemia. // d. Nuclear stress test 2/19: EF 55, transient ST depression in recovery, no ischemia, inf defect (artifact vs scar)   . DM type 2, uncontrolled, with neuropathy ([place])   . ED (erectile dysfunction)   . Essential hypertension   . Hx of echocardiogram    Echo (10/2013): Mild LVH, moderate focal basal hypertrophy of the septum, EF 50-55%, no RWMA  . Hyperlipidemia   . Internal hemorrhoids without mention of complication   . Obesity   . Obesity   . OSA on CPAP   . Paresthesia of both legs   .  Paroxysmal atrial fibrillation ([name])    a. failed Tikosyn, Multaq. b. s/p ablation of AF/AFL in 12/2013.  [name] Paroxysmal atrial flutter ([place])    a. s/p ablation of AF/AFL in 12/2013.  [name] Smoker    QUIT 10/13/13  . Tubular adenoma of colon 2016    Past Surgical History:  Procedure Laterality Date  . ABLATION  01-12-2014   PVI and CTI by Dr [name]  . ANAL FISSURE REPAIR  ~ 2010  . ATRIAL FIBRILLATION ABLATION N/A 01/12/2014   Procedure: ATRIAL FIBRILLATION ABLATION;  Surgeon: Coralyn Mark, MD;  Location: [facility];  Service: Cardiovascular;  Laterality: N/A;  . CARDIAC CATHETERIZATION  2007  . CORONARY ANGIOPLASTY WITH STENT PLACEMENT  1996   residual 50% LAD and RCA by cath in 2007  . CORONARY ANGIOPLASTY WITH STENT PLACEMENT  1995  . INGUINAL HERNIA REPAIR Right 1963  . LEFT HEART CATH AND CORONARY ANGIOGRAPHY N/A 10/11/2017   Procedure: LEFT HEART CATH AND CORONARY ANGIOGRAPHY;  Surgeon: [name], MD;  Location: [facility];  Service: Cardiovascular;  Laterality: N/A;  . TEE WITHOUT CARDIOVERSION N/A 01/11/2014   Procedure: TRANSESOPHAGEAL ECHOCARDIOGRAM (TEE);  Surgeon: [name], MD;  Location: [facility];  Service: Cardiovascular;  Laterality: N/A;  . TONSILLECTOMY AND ADENOIDECTOMY  1979    Current Medications: Current Meds  Medication Sig  . albuterol (VENTOLIN HFA) 108 (90 Base) MCG/ACT inhaler INHALE 2 PUFFS INTO THE LUNGS EVERY 2 HOURS AS NEEDED FOR WHEEZING OR SHORTNESS OF BREATH  . atorvastatin (LIPITOR) 40 MG tablet Take 1 tablet (40 mg total) by mouth daily at 6 PM.  . diltiazem (CARDIZEM CD) 360 MG 24 hr capsule TAKE 1 CAPSULE(360 MG) BY MOUTH DAILY  . empagliflozin (JARDIANCE) 10 MG TABS tablet Take 1 tablet (10 mg total) by mouth daily.  . Fluticasone-Salmeterol (ADVAIR DISKUS) 100-50 MCG/DOSE AEPB INHALE 1 PUFF INTO THE LUNGS TWICE DAILY  . glucose blood (ONETOUCH VERIO) test strip USE TO CHECK BLOOD SUGAR ONCE DAILY  . icosapent Ethyl (VASCEPA) 1  g capsule Take 2 capsules (2 g total) by mouth 2 (two) times daily.  . metFORMIN (GLUCOPHAGE-XR) 750 MG 24 hr tablet TAKE 1 TABLET(750 MG) BY MOUTH TWICE DAILY  . nitroGLYCERIN (NITROSTAT) 0.4 MG SL tablet DISSOLVE 1 TABLET UNDER THE TONGUE EVERY 5 MINUTES FOR 3 DOSES AS NEEDED FOR CHEST PAIN(CALL 911 IF 3RD DOSE IS TAKEN)  . NON FORMULARY [facility]  Pain Cream -- Ketamine 5%; Baclofen 2%; Gabapentin 5%; Lidocaine 5%; Menthol 1% 200 gm with 3 refills  Was faxed to [facility] on 02/21/20  . ONETOUCH DELICA LANCETS FINE MISC USE TO CHECK BLOOD SUGAR ONCE DAILY  . pantoprazole (PROTONIX) 40 MG tablet Take 1 tablet (40 mg total) by mouth daily.  . pregabalin (LYRICA) 100 MG capsule Take 1 capsule in the morning, 1 capsule at noon, and 2 capsules qhs (Patient taking differently: Take 100 mg by mouth daily.)  . rivaroxaban (XARELTO) 20 MG TABS tablet TAKE 1 TABLET(20 MG) BY MOUTH DAILY WITH SUPPER  . Sodium Sulfate-Mag Sulfate-KCl (SUTAB) [phone] MG TABS Take 1 kit by mouth as directed. BIN: [id number] PCN: CN GROUP: [id number] MEMBER ID: [id number];FBP AS CASH;NO PRIOR AUTHORIZATION  . sotalol (BETAPACE) 80 MG tablet Take 1 tablet (80 mg total) by mouth 2 (two) times daily.  . traMADol (ULTRAM) 50 MG tablet TAKE 1 TABLET BY MOUTH THREE TIMES DAILY  . traMADol (ULTRAM) 50 MG tablet Take 1 tablet (50 mg total) by mouth 3 (three) times daily.     Allergies:   Cephalosporins, Penicillins, Thallium, Codeine, Gabapentin, and Eliquis [apixaban]   Social History   Socioeconomic History  . Marital status: Married    Spouse name: Not on file  . Number of children: Not on file  . Years of education: Not on file  . Highest education level: Not on file  Occupational History  . Not on file  Tobacco Use  . Smoking status: Former Smoker    Packs/day: 1.00    Years: 43.00    Pack years: 43.00    Types: Cigarettes    Quit date: 2020    Years since quitting: 2.0  . Smokeless  tobacco: Never Used  Vaping Use  . Vaping Use: Never used  Substance and Sexual Activity  . Alcohol use: Yes    Alcohol/week: 0.0 standard drinks    Comment: 10/27/2013 "might have 1 beer/month"  . Drug use: No  . Sexual activity: Yes  Other Topics Concern  . Not on file  Social History Narrative   Works as an Programme researcher, broadcasting/film/video for [employer] Strain: Not on [employer] Insecurity: Not on file  Transportation Needs: Not on file  Physical Activity: Not on file  Stress: Not on file  Social Connections: Not on file     Family History: The patient's family history includes Heart attack in his father; Heart disease in an other family member; Heart disease (age of onset: 46) in his father; Heart disease (age of onset: 63) in his mother; Hyperlipidemia in an other family member; Hypertension in his mother. There is no history of Stroke, Colon cancer, Esophageal cancer, Liver disease, Pancreatic cancer, or Stomach cancer.  ROS:   Please see the history of present illness.    He has aortic atherosclerosis and three-vessel coronary calcification noted on a recent lung scan that is done yearly as surveillance for cancer.  He was reassured that this is compatible with the atherosclerosis burden that we are treating secondarily.  We will resume Vascepa and if he has recurrent diarrhea he will let us know.  Having some issues with affordability of his medications.  Insurance company wants him off Xarelto.  Most recent prescription was nearly $500.  He has had diarrhea off and on.  The Cipro was discontinued because it was felt to be causing diarrhea.  It continued after discontinuation.  Protonix and Metformin doses were adjusted.  The diarrhea went away.  He would like to be placed back on Vascepa since it was controlling a significant risk factor for recurrent vascular disease.  All other systems reviewed and are negative.  EKGs/Labs/Other Studies Reviewed:     The following studies were reviewed today: No new imaging data.  We have never screened for presence of abdominal aortic aneurysm.  EKG:  EKG normal sinus rhythm with normal appearance. Possible left atrial abnormality. Compared to February 2021, no changes occurred.  Recent Labs: 07/01/2020: ALT 25; BUN 18; Creat 1.23; Hemoglobin 17.5; Platelets 267; Potassium 4.4; Sodium 139; TSH 1.13  Recent Lipid Panel    Component Value Date/Time   CHOL 132 07/01/2020 1411   CHOL 130 06/29/2019 0724   TRIG 504 (H) 07/01/2020 1411   HDL 36 (L) 07/01/2020 1411   HDL 35 (L) 06/29/2019 0724   CHOLHDL 3.7 07/01/2020 1411   VLDL 74.8 (H) 08/12/2018 1124   [place]  07/01/2020 1411     Comment:     . LDL cholesterol not calculated. Triglyceride levels greater than 400 mg/dL invalidate calculated LDL results. . Reference range: <100 . Desirable range <100 mg/dL for primary prevention;   <70 mg/dL for patients with CHD or diabetic patients  with > or = 2 CHD risk factors. [name] LDL-C is now calculated using the Martin-Hopkins  calculation, which is a validated novel method providing  better accuracy than the Friedewald equation in the  estimation of LDL-C.  Cresenciano Genre et al. Annamaria Helling. 6659;935(70): 2061-2068  (http://education.Quest[employer].com/faq/FAQ164)    LDLDIRECT 49.0 08/12/2018 1124    Physical Exam:    VS:  BP 120/70 (BP Location: Left Arm, Patient Position: Sitting, Cuff Size: Normal)   Pulse 71   Ht '6\' 1"'  (1.854 m)   Wt 251 lb (113.9 kg)   SpO2 94%   BMI 33.12 kg/m     Wt Readings from Last 3 Encounters:  09/20/20 251 lb (113.9 kg)  07/01/20 245 lb 4.8 oz (111.3 kg)  02/21/20 248 lb (112.5 kg)     GEN: Obese. No acute distress HEENT: Normal NECK: No JVD. LYMPHATICS: No lymphadenopathy CARDIAC: No murmur. RRR no gallop, or edema. VASCULAR:  Normal Pulses. No bruits. RESPIRATORY:  Clear to auscultation without rales, wheezing or rhonchi  ABDOMEN: Soft, non-tender,  non-distended, No  pulsatile mass, MUSCULOSKELETAL: No deformity  SKIN: Warm and dry NEUROLOGIC:  Alert and oriented x 3 PSYCHIATRIC:  Normal affect   ASSESSMENT:    1. Coronary artery disease involving native coronary artery of native heart with angina pectoris ([place])   2. Atrial fibrillation with RVR ([place])   3. Essential hypertension   4. Family history of abdominal aortic aneurysm   5. Obstructive sleep apnea   6. Chronic anticoagulation   7. Other hyperlipidemia   8. Educated about COVID-19 virus infection    PLAN:    In order of problems listed above:  1. Start Vascepa 2 g twice daily.  Secondary prevention discussed. 2. Continue sotalol and anticoagulant therapy 3. Controlled.  Continue diltiazem 360 mg daily. 4. Abdominal duplex will be performed given his mother's history abdominal aortic aneurysm with rupture. 5. He is compliant with CPAP. 6. He is on Xarelto but we will likely need to switch to Eliquis because of insurance preference.  Once he gets within 2 weeks of exhausting his Xarelto supply, he will let us know and we will switch over to 5 mg twice daily of Eliquis.  He was reassured that the change in therapy was not decreased protection against stroke. 7. Continue high intensity statin therapy.  Most recent LDL was less than 70.  He is on Lipitor 40 mg/day. 8. He is vaccinated, boosted, and practicing mitigation.  Overall education and awareness concerning secondary risk prevention was discussed in detail: LDL less than 70, hemoglobin A1c less than 7, blood pressure target less than 130/80 mmHg, >150 minutes of moderate aerobic activity per week, avoidance of smoking, weight control (via diet and exercise), and continued surveillance/management of/for obstructive sleep apnea.   Follow-up 1 year.    Medication Adjustments/Labs and Tests Ordered: Current medicines are reviewed at length with the patient today.  Concerns regarding medicines are outlined above.   Orders Placed This Encounter  Procedures  . EKG 12-Lead  . VAS [place] AAA DUPLEX   Meds ordered this encounter  Medications  . icosapent Ethyl (VASCEPA) 1 g capsule    Sig: Take 2 capsules (2 g total) by mouth 2 (two) times daily.    Dispense:  120 capsule    Refill:  11    Patient Instructions  Medication Instructions:  1) START Vascepa 2g twice daily.  *If you need a refill on your cardiac medications before your next appointment, please call your pharmacy*   Lab Work: None If you have labs (blood work) drawn today and your tests are completely normal, you will receive your results only by: [name] MyChart Message (if you have MyChart) OR . A paper copy in the mail If you have any lab test that is abnormal or we need to change your treatment, we will call you to review the results.   Testing/Procedures: Your physician has requested that you have an abdominal aorta duplex. During this test, an ultrasound is used to evaluate the aorta. Allow 30 minutes for this exam. Do not eat after midnight the day before and avoid carbonated beverages   Follow-Up: At [facility], you and your health needs are our priority.  As part of our continuing mission to provide you with exceptional heart care, we have created designated Provider Care Teams.  These Care Teams include your primary Cardiologist (physician) and Advanced Practice Providers (APPs -  Physician Assistants and Nurse Practitioners) who all work together to provide you with the care you need, when you need it.  We  recommend signing up for the patient portal called "MyChart".  Sign up information is provided on this After Visit Summary.  MyChart is used to connect with patients for Virtual Visits (Telemedicine).  Patients are able to view lab/test results, encounter notes, upcoming appointments, etc.  Non-urgent messages can be sent to your provider as well.   To learn more about what you can do with MyChart, go to  [email or web address].    Your next appointment:   1 year(s)  The format for your next appointment:   In Person  Provider:   You may see [name], MD or one of the following Advanced Practice Providers on your designated Care Team:    Kathyrn Drown, NP    Other Instructions      Signed, [name], MD  09/20/2020 5:32 PM    [place]

## 2020-09-20 ENCOUNTER — Ambulatory Visit: Payer: BC Managed Care – PPO

## 2020-09-20 ENCOUNTER — Ambulatory Visit: Payer: BC Managed Care – PPO | Admitting: Interventional Cardiology

## 2020-09-20 ENCOUNTER — Encounter: Payer: Self-pay | Admitting: Interventional Cardiology

## 2020-09-20 ENCOUNTER — Other Ambulatory Visit: Payer: Self-pay

## 2020-09-20 ENCOUNTER — Ambulatory Visit: Payer: BC Managed Care – PPO | Admitting: Podiatry

## 2020-09-20 VITALS — BP 120/70 | HR 71 | Ht 73.0 in | Wt 251.0 lb

## 2020-09-20 DIAGNOSIS — I25119 Atherosclerotic heart disease of native coronary artery with unspecified angina pectoris: Secondary | ICD-10-CM

## 2020-09-20 DIAGNOSIS — M79672 Pain in left foot: Secondary | ICD-10-CM

## 2020-09-20 DIAGNOSIS — I4891 Unspecified atrial fibrillation: Secondary | ICD-10-CM

## 2020-09-20 DIAGNOSIS — M79671 Pain in right foot: Secondary | ICD-10-CM

## 2020-09-20 DIAGNOSIS — Z7901 Long term (current) use of anticoagulants: Secondary | ICD-10-CM

## 2020-09-20 DIAGNOSIS — Z8249 Family history of ischemic heart disease and other diseases of the circulatory system: Secondary | ICD-10-CM | POA: Diagnosis not present

## 2020-09-20 DIAGNOSIS — E0841 Diabetes mellitus due to underlying condition with diabetic mononeuropathy: Secondary | ICD-10-CM | POA: Diagnosis not present

## 2020-09-20 DIAGNOSIS — E7849 Other hyperlipidemia: Secondary | ICD-10-CM

## 2020-09-20 DIAGNOSIS — Z7189 Other specified counseling: Secondary | ICD-10-CM

## 2020-09-20 DIAGNOSIS — G4733 Obstructive sleep apnea (adult) (pediatric): Secondary | ICD-10-CM

## 2020-09-20 DIAGNOSIS — I10 Essential (primary) hypertension: Secondary | ICD-10-CM

## 2020-09-20 MED ORDER — ICOSAPENT ETHYL 1 G PO CAPS
2.0000 g | ORAL_CAPSULE | Freq: Two times a day (BID) | ORAL | 11 refills | Status: DC
Start: 2020-09-20 — End: 2021-10-04
  Filled 2021-06-04: qty 120, 30d supply, fill #0
  Filled 2021-06-11: qty 360, 90d supply, fill #0
  Filled 2021-09-04: qty 120, 30d supply, fill #1

## 2020-09-20 MED ORDER — TRAMADOL HCL 50 MG PO TABS: 50.0000 mg | ORAL_TABLET | Freq: Three times a day (TID) | ORAL | 2 refills | Status: DC

## 2020-09-20 NOTE — Patient Instructions (Signed)
Medication Instructions:  1) START Vascepa 2g twice daily.  *If you need a refill on your cardiac medications before your next appointment, please call your pharmacy*   Lab Work: None If you have labs (blood work) drawn today and your tests are completely normal, you will receive your results only by: [name] MyChart Message (if you have MyChart) OR . A paper copy in the mail If you have any lab test that is abnormal or we need to change your treatment, we will call you to review the results.   Testing/Procedures: Your physician has requested that you have an abdominal aorta duplex. During this test, an ultrasound is used to evaluate the aorta. Allow 30 minutes for this exam. Do not eat after midnight the day before and avoid carbonated beverages   Follow-Up: At [facility], you and your health needs are our priority.  As part of our continuing mission to provide you with exceptional heart care, we have created designated Provider Care Teams.  These Care Teams include your primary Cardiologist (physician) and Advanced Practice Providers (APPs -  Physician Assistants and Nurse Practitioners) who all work together to provide you with the care you need, when you need it.  We recommend signing up for the patient portal called "MyChart".  Sign up information is provided on this After Visit Summary.  MyChart is used to connect with patients for Virtual Visits (Telemedicine).  Patients are able to view lab/test results, encounter notes, upcoming appointments, etc.  Non-urgent messages can be sent to your provider as well.   To learn more about what you can do with MyChart, go to [email or web address].    Your next appointment:   1 year(s)  The format for your next appointment:   In Person  Provider:   You may see [name], MD or one of the following Advanced Practice Providers on your designated Care Team:    Kathyrn Drown, NP    Other Instructions

## 2020-09-22 NOTE — Progress Notes (Signed)
Subjective:   Patient ID: Eric Palmer, male   DOB: 65 y.o.   MRN: [id number]   HPI Patient presents stating that he needs more medications that they help to keep his condition under control and he does take Lyrica also.  Patient states that this is been helpful   ROS      Objective:  Physical Exam  No change neurovascular status was significant diminishment of neurological bike status.  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  Patient will be seen back for [place] to recheck is encouraged to call questions concerns

## 2020-09-24 ENCOUNTER — Other Ambulatory Visit: Payer: Self-pay

## 2020-09-24 ENCOUNTER — Telehealth: Payer: Self-pay

## 2020-09-24 MED ORDER — NONFORMULARY OR COMPOUNDED ITEM: Status: DC

## 2020-09-24 NOTE — Telephone Encounter (Signed)
**Note De-Identified Mateus Rewerts Obfuscation** I did a Vascepa PA through covermymeds and received this message: [name] (Key: [place]) Vascepa 1GM capsules  Form: [employer] control surveyor Form (CB) Determination: Favorable Prior authorization for Vascepa has been approved.  Message from plan: Effective from 09/24/2020 through 09/23/2021.  I have notified [place] of this approval.

## 2020-09-25 ENCOUNTER — Ambulatory Visit (HOSPITAL_COMMUNITY)
Admission: RE | Admit: 2020-09-25 | Discharge: 2020-09-25 | Disposition: A | Discharge status: Home / Self Care | Payer: BC Managed Care – PPO | Source: Ambulatory Visit | Attending: Cardiovascular Disease | Admitting: Cardiovascular Disease

## 2020-09-25 ENCOUNTER — Other Ambulatory Visit: Payer: Self-pay

## 2020-09-25 DIAGNOSIS — I10 Essential (primary) hypertension: Secondary | ICD-10-CM | POA: Diagnosis not present

## 2020-09-25 DIAGNOSIS — I25119 Atherosclerotic heart disease of native coronary artery with unspecified angina pectoris: Secondary | ICD-10-CM | POA: Insufficient documentation

## 2020-09-25 DIAGNOSIS — Z8249 Family history of ischemic heart disease and other diseases of the circulatory system: Secondary | ICD-10-CM | POA: Diagnosis not present

## 2020-09-30 ENCOUNTER — Other Ambulatory Visit: Payer: Self-pay | Admitting: Family Medicine

## 2020-09-30 DIAGNOSIS — D582 Other hemoglobinopathies: Secondary | ICD-10-CM

## 2020-09-30 NOTE — Progress Notes (Signed)
Per last lab results future order created for CBC per PCP/thx dmf

## 2020-10-03 ENCOUNTER — Other Ambulatory Visit: Payer: Self-pay

## 2020-10-03 ENCOUNTER — Other Ambulatory Visit (INDEPENDENT_AMBULATORY_CARE_PROVIDER_SITE_OTHER): Payer: BC Managed Care – PPO

## 2020-10-03 DIAGNOSIS — D582 Other hemoglobinopathies: Secondary | ICD-10-CM

## 2020-10-03 LAB — CBC WITH DIFFERENTIAL/PLATELET
Basophils Absolute: 0.1 10*3/uL (ref 0.0–0.1)
Basophils Relative: 0.9 % (ref 0.0–3.0)
Eosinophils Absolute: 0.4 10*3/uL (ref 0.0–0.7)
Eosinophils Relative: 4.8 % (ref 0.0–5.0)
HCT: 47.5 % (ref 39.0–52.0)
Hemoglobin: 16.1 g/dL (ref 13.0–17.0)
Lymphocytes Relative: 21.7 % (ref 12.0–46.0)
Lymphs Abs: 1.7 10*3/uL (ref 0.7–4.0)
MCHC: 34 g/dL (ref 30.0–36.0)
MCV: 89 fl (ref 78.0–100.0)
Monocytes Absolute: 0.9 10*3/uL (ref 0.1–1.0)
Monocytes Relative: 10.6 % (ref 3.0–12.0)
Neutro Abs: 5 10*3/uL (ref 1.4–7.7)
Neutrophils Relative %: 62 % (ref 43.0–77.0)
Platelets: 229 10*3/uL (ref 150.0–400.0)
RBC: 5.33 Mil/uL (ref 4.22–5.81)
RDW: 13.4 % (ref 11.5–15.5)
WBC: 8.1 10*3/uL (ref 4.0–10.5)

## 2020-10-04 NOTE — Progress Notes (Signed)
Mychart message sent: Hgb back down to normal range.

## 2020-11-06 MED ORDER — NITROGLYCERIN 0.4 MG SL SUBL
SUBLINGUAL_TABLET | SUBLINGUAL | 7 refills | Status: DC
  Filled 2021-06-04: qty 25, 30d supply, fill #0

## 2020-12-01 ENCOUNTER — Other Ambulatory Visit: Payer: Self-pay | Admitting: Interventional Cardiology

## 2020-12-02 ENCOUNTER — Other Ambulatory Visit: Payer: Self-pay | Admitting: Pharmacist

## 2020-12-02 DIAGNOSIS — I480 Paroxysmal atrial fibrillation: Secondary | ICD-10-CM

## 2020-12-02 MED ORDER — RIVAROXABAN 20 MG PO TABS: ORAL_TABLET | ORAL | 1 refills | Status: DC

## 2020-12-02 NOTE — Telephone Encounter (Signed)
Prescription refill request for Xarelto received.  Indication:afib Last office visit:09/20/20 Weight:113.9kg Age:65 Scr:1.23 CrCl:99

## 2020-12-28 ENCOUNTER — Other Ambulatory Visit: Payer: Self-pay | Admitting: Interventional Cardiology

## 2021-01-01 ENCOUNTER — Other Ambulatory Visit: Payer: Self-pay | Admitting: Family Medicine

## 2021-01-29 ENCOUNTER — Other Ambulatory Visit: Payer: Self-pay | Admitting: Interventional Cardiology

## 2021-02-25 ENCOUNTER — Encounter: Payer: Self-pay | Admitting: Family Medicine

## 2021-02-25 ENCOUNTER — Telehealth (INDEPENDENT_AMBULATORY_CARE_PROVIDER_SITE_OTHER): Payer: BC Managed Care – PPO | Admitting: Family Medicine

## 2021-02-25 VITALS — Wt 240.0 lb

## 2021-02-25 DIAGNOSIS — U071 COVID-19: Secondary | ICD-10-CM

## 2021-02-25 MED ORDER — MOLNUPIRAVIR EUA 200MG CAPSULE: 4.0000 | ORAL_CAPSULE | Freq: Two times a day (BID) | ORAL | 0 refills | Status: AC

## 2021-02-25 NOTE — Progress Notes (Signed)
Patient ID: Eric Palmer, male   DOB: [birth year], 65 y.o.   MRN: [id number] This visit type was conducted due to national recommendations for restrictions regarding the COVID-19 pandemic in an effort to limit this patient's exposure and mitigate transmission in our community.   Virtual Visit via Video Note  I connected with Eric Palmer on 02/25/21 at  1:15 PM EDT by a video enabled telemedicine application and verified that I am speaking with the correct person using two identifiers.  Location patient: home Location provider:work or home office Persons participating in the virtual visit: patient, provider  I discussed the limitations of evaluation and management by telemedicine and the availability of in person appointments. The patient expressed understanding and agreed to proceed.   HPI: Eric Palmer has COVID-19 infection.  He had onset rather acutely early this morning of chills, low-grade fever, nasal congestion, dry cough, body aches.  He does have multiple risk factors including history of obesity, type 2 diabetes, hypertension, CAD, obstructive sleep apnea.  Mild dyspnea with activity but not at rest.  Has been using albuterol inhaler regularly.  Also takes Advair.  He had [employer] vaccine 7/21.  No booster vaccine.  Denies any nausea, vomiting, diarrhea.  Does not have home pulse oximeter.   ROS: See pertinent positives and negatives per HPI.  Past Medical History:  Diagnosis Date   Anal fissure    Chronic bronchitis ([place])    "get it q spring and fall" (10/27/2013)   Coronary artery disease    a. s/p PCI in 1996. b. LHC 01/2006 showed 50% mid LAD, otherwise widely patent cors with minimal irregularities. c. Nuclear stress test in 04/2009 was reportedly low risk, no ischemia. // d. Nuclear stress test 2/19: EF 55, transient ST depression in recovery, no ischemia, inf defect (artifact vs scar)    DM type 2, uncontrolled, with neuropathy [facility])    ED (erectile dysfunction)     Essential hypertension    Hx of echocardiogram    Echo (10/2013): Mild LVH, moderate focal basal hypertrophy of the septum, EF 50-55%, no RWMA   Hyperlipidemia    Internal hemorrhoids without mention of complication    Obesity    Obesity    OSA on CPAP    Paresthesia of both legs    Paroxysmal atrial fibrillation ([place])    a. failed Tikosyn, Multaq. b. s/p ablation of AF/AFL in 12/2013.   Paroxysmal atrial flutter ([place])    a. s/p ablation of AF/AFL in 12/2013.   Smoker    QUIT 10/13/13   Tubular adenoma of colon 2016    Past Surgical History:  Procedure Laterality Date   ABLATION  01-12-2014   PVI and CTI by Dr [name]   ANAL FISSURE REPAIR  ~ 2010   [place] N/A 01/12/2014   Procedure: ATRIAL FIBRILLATION ABLATION;  Surgeon: Coralyn Mark, MD;  Location: [facility] CATH LAB;  Service: Cardiovascular;  Laterality: N/A;   CARDIAC CATHETERIZATION  2007   CORONARY ANGIOPLASTY WITH STENT PLACEMENT  1996   residual 50% LAD and RCA by cath in 2007   [place] Right 1963   LEFT HEART CATH AND CORONARY ANGIOGRAPHY N/A 10/11/2017   Procedure: LEFT HEART CATH AND CORONARY ANGIOGRAPHY;  Surgeon: [name], MD;  Location: [facility] CV LAB;  Service: Cardiovascular;  Laterality: N/A;   TEE WITHOUT CARDIOVERSION N/A 01/11/2014   Procedure: TRANSESOPHAGEAL ECHOCARDIOGRAM (TEE);  Surgeon: Arnette Norris  Deboraha Sprang, MD;  [facility]: [facility]OS[facility];  Service: Cardiovascular;  Laterality: N/A;   TONSILLECTOMY AND ADENOIDECTOMY  1979    Family History  Problem Relation Age of Onset   Heart disease Mother 59       Arrhythmia   Hypertension Mother    Heart disease Father 77       died 90 CHF   Heart attack Father    Hyperlipidemia Other    Heart disease Other    Stroke Neg Hx    Colon cancer Neg Hx    Esophageal cancer Neg Hx    Liver disease Neg Hx    Pancreatic cancer Neg Hx    Stomach cancer Neg Hx     SOCIAL HX: Quit  smoking 2020   Current Outpatient Medications:    albuterol (VENTOLIN HFA) 108 (90 Base) MCG/ACT inhaler, INHALE 2 PUFFS INTO THE LUNGS EVERY 2 HOURS AS NEEDED FOR WHEEZING OR SHORTNESS OF BREATH, Disp: 8.5 g, Rfl: 1   atorvastatin (LIPITOR) 40 MG tablet, TAKE 1 TABLET(40 MG) BY MOUTH DAILY AT 6 PM, Disp: 90 tablet, Rfl: 2   diltiazem (CARDIZEM CD) 360 MG 24 hr capsule, TAKE 1 CAPSULE(360 MG) BY MOUTH DAILY, Disp: 90 capsule, Rfl: 2   empagliflozin (JARDIANCE) 10 MG TABS tablet, Take 1 tablet (10 mg total) by mouth daily., Disp: 90 tablet, Rfl: 3   Fluticasone-Salmeterol (ADVAIR DISKUS) 100-50 MCG/DOSE AEPB, INHALE 1 PUFF INTO THE LUNGS TWICE DAILY, Disp: 60 each, Rfl: 11   glucose blood (ONETOUCH VERIO) test strip, USE TO CHECK BLOOD SUGAR ONCE DAILY, Disp: 100 each, Rfl: 0   icosapent Ethyl (VASCEPA) 1 g capsule, Take 2 capsules (2 g total) by mouth 2 (two) times daily., Disp: 120 capsule, Rfl: 11   metFORMIN (GLUCOPHAGE-XR) 750 MG 24 hr tablet, TAKE 1 TABLET(750 MG) BY MOUTH TWICE DAILY, Disp: 180 tablet, Rfl: 3   molnupiravir EUA 200 mg CAPS, Take 4 capsules (800 mg total) by mouth 2 (two) times daily for 5 days., Disp: 40 capsule, Rfl: 0   nitroGLYCERIN (NITROSTAT) 0.4 MG SL tablet, DISSOLVE 1 TABLET UNDER THE TONGUE EVERY 5 MINUTES FOR 3 DOSES AS NEEDED FOR CHEST PAIN(CALL 911 IF 3RD DOSE IS TAKEN), Disp: 25 tablet, Rfl: 7   NON FORMULARY, [facility]  Pain Cream -- Ketamine 5%; Baclofen 2%; Gabapentin 5%; Lidocaine 5%; Menthol 1% 200 gm with 3 refills  Was faxed to [facility] on 02/21/20, Disp: , Rfl:    NONFORMULARY OR COMPOUNDED ITEM, Pain cream : ketamine 5%, baclofen 2%, gabapentin 5%, lidocaine 5%, menthol 1% Order faxed to [facility], Disp: , Rfl:    ONETOUCH DELICA LANCETS FINE MISC, USE TO CHECK BLOOD SUGAR ONCE DAILY, Disp: 100 each, Rfl: 0   pantoprazole (PROTONIX) 40 MG tablet, Take 1 tablet (40 mg total) by mouth daily., Disp: 90 tablet, Rfl: 3    pregabalin (LYRICA) 100 MG capsule, Take 1 capsule in the morning, 1 capsule at noon, and 2 capsules qhs (Patient taking differently: Take 100 mg by mouth daily.), Disp: 360 capsule, Rfl: 1   rivaroxaban (XARELTO) 20 MG TABS tablet, TAKE 1 TABLET(20 MG) BY MOUTH DAILY WITH SUPPER, Disp: 90 tablet, Rfl: 1   sotalol (BETAPACE) 80 MG tablet, TAKE 1 TABLET(80 MG) BY MOUTH TWICE DAILY, Disp: 180 tablet, Rfl: 2   traMADol (ULTRAM) 50 MG tablet, TAKE 1 TABLET BY MOUTH THREE TIMES DAILY, Disp: 90 tablet, Rfl: 0  EXAM:  VITALS per patient if applicable:  GENERAL: alert,  oriented, appears well and in no acute distress  HEENT: atraumatic, conjunttiva clear, no obvious abnormalities on inspection of external nose and ears  NECK: normal movements of the head and neck  LUNGS: on inspection no signs of respiratory distress, breathing rate appears normal, no obvious gross SOB, gasping or wheezing  CV: no obvious cyanosis  MS: moves all visible extremities without noticeable abnormality  PSYCH/NEURO: pleasant and cooperative, no obvious depression or anxiety, speech and thought processing grossly intact  ASSESSMENT AND PLAN:  Discussed the following assessment and plan:  COVID-19-patient currently in no respiratory distress.  He does have multiple risk factors as above and had 1 vaccine but no booster.  Because of issues above does have high risk for complication though currently stable  -We discussed possible antiviral therapies.  He has not had any recent blood work or renal profile.  We elected to go and start Molnupiravir 200 mg 4 capsules twice daily for 5 days -Plenty fluids and rest -He is encouraged to consider getting home pulse oximeter to monitor oxygen and be in touch if this is dropping down to 90% or lower -He is aware of CDC isolation recommendations     I discussed the assessment and treatment plan with the patient. The patient was provided an opportunity to ask questions and all  were answered. The patient agreed with the plan and demonstrated an understanding of the instructions.   The patient was advised to call back or seek an in-person evaluation if the symptoms worsen or if the condition fails to improve as anticipated.     Carolann Littler, MD

## 2021-03-18 DIAGNOSIS — M25512 Pain in left shoulder: Secondary | ICD-10-CM | POA: Diagnosis not present

## 2021-04-18 ENCOUNTER — Other Ambulatory Visit: Payer: Self-pay | Admitting: Family Medicine

## 2021-04-23 ENCOUNTER — Telehealth: Payer: Self-pay

## 2021-04-23 ENCOUNTER — Ambulatory Visit: Payer: BC Managed Care – PPO | Admitting: Gastroenterology

## 2021-04-23 ENCOUNTER — Encounter: Payer: Self-pay | Admitting: Gastroenterology

## 2021-04-23 DIAGNOSIS — Z86010 Personal history of colonic polyps: Secondary | ICD-10-CM | POA: Insufficient documentation

## 2021-04-23 DIAGNOSIS — Z860101 Personal history of adenomatous and serrated colon polyps: Secondary | ICD-10-CM | POA: Insufficient documentation

## 2021-04-23 DIAGNOSIS — Z7901 Long term (current) use of anticoagulants: Secondary | ICD-10-CM

## 2021-04-23 MED ORDER — PLENVU 140 G PO SOLR: 1.0000 | ORAL | 0 refills | Status: DC

## 2021-04-23 NOTE — Telephone Encounter (Signed)
Patient with diagnosis of afib on Xarelto for anticoagulation.    Procedure: colonoscopy Date of procedure: 07/07/21  CHA2DS2-VASc Score = 3  This indicates a 3.2% annual risk of stroke. The patient's score is based upon: CHF History: No HTN History: Yes Diabetes History: Yes Stroke History: No Vascular Disease History: Yes Age Score: 0 Gender Score: 0   CrCl 55m/min using adjusted body weight Platelet count 229K  Per office protocol, patient can hold Xarelto for 1 day prior to procedure as requested.

## 2021-04-23 NOTE — Patient Instructions (Addendum)
If you are age 64 or younger, your body mass index should be between 19-25. Your Body mass index is 31.8 kg/m. If this is out of the aformentioned range listed, please consider follow up with your Primary Care Provider.   __________________________________________________________  The [facility] providers would like to encourage you to use [facility] to communicate with providers for non-urgent requests or questions.  Due to long hold times on the telephone, sending your provider a message by [facility] may be a faster and more efficient way to get a response.  Please allow 48 business hours for a response.  Please remember that this is for non-urgent requests.   You have been scheduled for a colonoscopy. Please follow written instructions given to you at your visit today.  Please pick up your prep supplies at the pharmacy within the next 1-3 days. If you use inhalers (even only as needed), please bring them with you on the day of your procedure.   You will be contacted by our office prior to your procedure for directions on holding your Xarelto.  If you do not hear from our office 1 week prior to your scheduled procedure, please call [phone] to discuss.   _  _   ORAL DIABETIC MEDICATION INSTRUCTIONS (Jardiance and Metformin)  The day before your procedure:  Take your diabetic pill as you do normally  The day of your procedure:  Do not take your diabetic pill   We will check your blood sugar levels during the admission process and again in Recovery before discharging you home.  Due to recent changes in healthcare laws, you may see the results of your imaging and laboratory studies on MyChart before your provider has had a chance to review them.  We understand that in some cases there may be results that are confusing or concerning to you. Not all laboratory results come back in the same time frame and the provider may be waiting for multiple results in order to interpret others.  Please  give [place] 48 hours in order for your provider to thoroughly review all the results before contacting the office for clarification of your results.   Thank you for entrusting me with your care and choosing [facility].  Alonza Bogus, PA-C

## 2021-04-23 NOTE — Telephone Encounter (Signed)
[facility] Pre-operative Risk Assessment     Request for surgical clearance:     Endoscopy Procedure  What type of surgery is being performed?     Colonoscopy  When is this surgery scheduled?     07-07-2021  What type of clearance is required ?   Pharmacy  Are there any medications that need to be held prior to surgery and how long? Xarelto x 1 day  Practice name and name of physician performing surgery?      [facility]  What is your office phone and fax number?      Phone- [phone]  [phone]  Anesthesia type (None, local, MAC, general)        MAC

## 2021-04-23 NOTE — Progress Notes (Signed)
Reviewed and agree with management plan.  Jorah Hua T. Nerida Boivin, MD FACG 

## 2021-04-23 NOTE — Progress Notes (Signed)
04/23/2021 LAMOINE ALCINDOR [id number] Apr 12, 1956   HISTORY OF PRESENT ILLNESS: This is a 65 year old male is a patient Dr. [name].  He is here today to discuss and schedule colonoscopy.  His last colonoscopy was in April 2016 at which time he was found to have a single 6 mm polyp that was removed and was a tubular adenoma on pathology.  He also had grade 1 internal hemorrhoids and moderate diverticulosis.  Repeat was recommended at a 5-year interval.  He says that he was scheduled to have his colonoscopy last year, but his insurance would not cover it as it is considered a diagnostic procedure.  He says that he is doing well, moving his bowels regularly.  No rectal bleeding.  He is on Xarelto for history of atrial fibrillation, but says he has not had an episode of A. fib in quite some time.  Follows with Dr. Daneen Schick and was last seen by him in January of this year.   Past Medical History:  Diagnosis Date   Anal fissure    Chronic bronchitis ([place])    "get it q spring and fall" (10/27/2013)   Coronary artery disease    a. s/p PCI in 1996. b. LHC 01/2006 showed 50% mid LAD, otherwise widely patent cors with minimal irregularities. c. Nuclear stress test in 04/2009 was reportedly low risk, no ischemia. // d. Nuclear stress test 2/19: EF 55, transient ST depression in recovery, no ischemia, inf defect (artifact vs scar)    DM type 2, uncontrolled, with neuropathy [facility])    ED (erectile dysfunction)    Essential hypertension    Hx of echocardiogram    Echo (10/2013): Mild LVH, moderate focal basal hypertrophy of the septum, EF 50-55%, no RWMA   Hyperlipidemia    Internal hemorrhoids without mention of complication    Obesity    Obesity    OSA on CPAP    Paresthesia of both legs    Paroxysmal atrial fibrillation ([place])    a. failed Tikosyn, Multaq. b. s/p ablation of AF/AFL in 12/2013.   Paroxysmal atrial flutter ([place])    a. s/p ablation of AF/AFL in 12/2013.   Smoker    QUIT 10/13/13    Tubular adenoma of colon 2016   Past Surgical History:  Procedure Laterality Date   ABLATION  01-12-2014   PVI and CTI by Dr [name]   ANAL FISSURE REPAIR  ~ 2010   [place] N/A 01/12/2014   Procedure: ATRIAL FIBRILLATION ABLATION;  Surgeon: Coralyn Mark, MD;  Location: [facility] CATH LAB;  Service: Cardiovascular;  Laterality: N/A;   CARDIAC CATHETERIZATION  2007   CORONARY ANGIOPLASTY WITH STENT PLACEMENT  1996   residual 50% LAD and RCA by cath in 2007   [place] Right 1963   LEFT HEART CATH AND CORONARY ANGIOGRAPHY N/A 10/11/2017   Procedure: LEFT HEART CATH AND CORONARY ANGIOGRAPHY;  Surgeon: [name], MD;  Location: [facility] CV LAB;  Service: Cardiovascular;  Laterality: N/A;   TEE WITHOUT CARDIOVERSION N/A 01/11/2014   Procedure: TRANSESOPHAGEAL ECHOCARDIOGRAM (TEE);  Surgeon: [name], MD;  Location: [facility];  Service: Cardiovascular;  Laterality: N/A;   [place]    reports that he quit smoking about 2 years ago. His smoking use included cigarettes. He has a 43.00 pack-year smoking history. He has never used smokeless tobacco. He reports current alcohol use. He reports  that he does not use drugs. family history includes Heart attack in his father; Heart disease in an other family member; Heart disease (age of onset: 82) in his father; Heart disease (age of onset: 34) in his mother; Hyperlipidemia in an other family member; Hypertension in his mother. Allergies  Allergen Reactions   Cephalosporins Anaphylaxis   Penicillins Anaphylaxis and Other (See Comments)    Has patient had a PCN reaction causing immediate rash, facial/tongue/throat swelling, SOB or lightheadedness with hypotension: Yes Has patient had a PCN reaction causing severe rash involving mucus membranes or skin necrosis: No Has patient had a PCN reaction that required hospitalization: No - went to  doctors office Has patient had a PCN reaction occurring within the last 10 years: No If all of the above answers are "NO", then may proceed with Cephalosporin use.    Thallium Itching and Rash   Codeine Itching   Gabapentin Other (See Comments)    "made me feel drugged"   Eliquis [Apixaban] Diarrhea      Outpatient Encounter Medications as of 04/23/2021  Medication Sig   albuterol (VENTOLIN HFA) 108 (90 Base) MCG/ACT inhaler INHALE 2 PUFFS INTO THE LUNGS EVERY 2 HOURS AS NEEDED FOR WHEEZING OR SHORTNESS OF BREATH   atorvastatin (LIPITOR) 40 MG tablet TAKE 1 TABLET(40 MG) BY MOUTH DAILY AT 6 PM   diltiazem (CARDIZEM CD) 360 MG 24 hr capsule TAKE 1 CAPSULE(360 MG) BY MOUTH DAILY   Fluticasone-Salmeterol (ADVAIR DISKUS) 100-50 MCG/DOSE AEPB INHALE 1 PUFF INTO THE LUNGS TWICE DAILY   glucose blood (ONETOUCH VERIO) test strip USE TO CHECK BLOOD SUGAR ONCE DAILY   icosapent Ethyl (VASCEPA) 1 g capsule Take 2 capsules (2 g total) by mouth 2 (two) times daily.   JARDIANCE 10 MG TABS tablet TAKE 1 TABLET(10 MG) BY MOUTH DAILY   metFORMIN (GLUCOPHAGE-XR) 750 MG 24 hr tablet TAKE 1 TABLET(750 MG) BY MOUTH TWICE DAILY   nitroGLYCERIN (NITROSTAT) 0.4 MG SL tablet DISSOLVE 1 TABLET UNDER THE TONGUE EVERY 5 MINUTES FOR 3 DOSES AS NEEDED FOR CHEST PAIN(CALL 911 IF 3RD DOSE IS TAKEN)   NONFORMULARY OR COMPOUNDED ITEM Pain cream : ketamine 5%, baclofen 2%, gabapentin 5%, lidocaine 5%, menthol 1% Order faxed to [place] USE TO CHECK BLOOD SUGAR ONCE DAILY   pregabalin (LYRICA) 100 MG capsule Take 1 capsule in the morning, 1 capsule at noon, and 2 capsules qhs (Patient taking differently: Take 100 mg by mouth daily.)   rivaroxaban (XARELTO) 20 MG TABS tablet TAKE 1 TABLET(20 MG) BY MOUTH DAILY WITH SUPPER   sotalol (BETAPACE) 80 MG tablet TAKE 1 TABLET(80 MG) BY MOUTH TWICE DAILY   traMADol (ULTRAM) 50 MG tablet TAKE 1 TABLET BY MOUTH THREE TIMES DAILY    [DISCONTINUED] NON FORMULARY [facility]  Pain Cream -- Ketamine 5%; Baclofen 2%; Gabapentin 5%; Lidocaine 5%; Menthol 1% 200 gm with 3 refills  Was faxed to [facility] on 02/21/20   [DISCONTINUED] pantoprazole (PROTONIX) 40 MG tablet Take 1 tablet (40 mg total) by mouth daily.   No facility-administered encounter medications on file as of 04/23/2021.     REVIEW OF SYSTEMS  : All other systems reviewed and negative except where noted in the History of Present Illness.   PHYSICAL EXAM: BP 140/90   Pulse 70   Ht '6\' 1"'$  (1.854 m)   Wt 241 lb (109.3 kg)   SpO2 99%   BMI 31.80 kg/m  General: Well developed  white male in no acute distress Head: Normocephalic and atraumatic Eyes:  Sclerae anicteric, conjunctiva pink. Ears: Normal auditory acuity Lungs: Clear throughout to auscultation; no W/R/R. Heart: Regular rate and rhythm; no M/R/G. Abdomen: Soft, non-distended.  BS present.  Non-tender. Rectal:  Will be done at the time of colonoscopy. Musculoskeletal: Symmetrical with no gross deformities  Skin: No lesions on visible extremities Extremities: No edema  Neurological: Alert oriented x 4, grossly non-focal Psychological:  Alert and cooperative. Normal mood and affect  ASSESSMENT AND PLAN: *Personal history of colon polyps:  Last colonoscopy 11/2014 showed one 6 mm tubular adenoma that was removed.  Will plan for colonoscopy with Dr. Fuller Plan. *Chronic anticoagulation with Xarelto:  Will hold Xarelto for 1 day prior to endoscopic procedures - will instruct when and how to resume after procedure. Benefits and risks of procedure explained including risks of bleeding, perforation, infection, missed lesions, reactions to medications and possible need for hospitalization and surgery for complications. Additional rare but real risk of stroke or other vascular clotting events off Xarelto also explained and need to seek urgent help if any signs of these problems occur. Will  communicate by phone or EMR with patient's prescribing provider, Dr. Tamala Julian, to confirm that holding Xarelto is reasonable in this case.     CC:  [name], MD

## 2021-04-24 NOTE — Telephone Encounter (Signed)
Patient advised that he has been given clearance to hold Xarelto 1 day prior to colonoscopy scheduled for 07-07-2021.  Patient advised to take last dose of Xarelto on 07-05-2021, and he will be advised when to restart Xarelto by Dr Fuller Plan after the procedure.  Patient agreed to plan and verbalized understanding.  No further questions.

## 2021-04-24 NOTE — Telephone Encounter (Signed)
    Patient Name: KASHE TOOTHMAN  DOB: 1956-02-10 MRN: [id number]  Primary Cardiologist: [name], MD  Chart reviewed as part of pre-operative protocol coverage. Patient has colonoscopy scheduled for 07/07/2021 and we were asked to give our recommendations for holding Xarelto. Per Pharmacy and office protocol: Patient can hold Xarelto for 1 day prior to procedure as requested. Please restart this as soon as able following colonoscopy.   I will route this recommendation to the requesting party via Epic fax function and remove from pre-op pool.  Please call with questions.  Darreld Mclean, PA-C 04/24/2021, 9:33 AM

## 2021-06-03 ENCOUNTER — Other Ambulatory Visit: Payer: Self-pay

## 2021-06-04 ENCOUNTER — Ambulatory Visit (INDEPENDENT_AMBULATORY_CARE_PROVIDER_SITE_OTHER): Payer: BC Managed Care – PPO | Admitting: Family Medicine

## 2021-06-04 ENCOUNTER — Other Ambulatory Visit (HOSPITAL_BASED_OUTPATIENT_CLINIC_OR_DEPARTMENT_OTHER): Payer: Self-pay

## 2021-06-04 VITALS — BP 130/72 | HR 70 | Temp 97.9°F | Wt 241.3 lb

## 2021-06-04 DIAGNOSIS — Z79899 Other long term (current) drug therapy: Secondary | ICD-10-CM

## 2021-06-04 DIAGNOSIS — Z23 Encounter for immunization: Secondary | ICD-10-CM | POA: Diagnosis not present

## 2021-06-04 DIAGNOSIS — E7849 Other hyperlipidemia: Secondary | ICD-10-CM | POA: Diagnosis not present

## 2021-06-04 DIAGNOSIS — I10 Essential (primary) hypertension: Secondary | ICD-10-CM | POA: Diagnosis not present

## 2021-06-04 DIAGNOSIS — E1142 Type 2 diabetes mellitus with diabetic polyneuropathy: Secondary | ICD-10-CM

## 2021-06-04 LAB — BASIC METABOLIC PANEL
BUN: 12 mg/dL (ref 6–23)
CO2: 30 mEq/L (ref 19–32)
Calcium: 9.4 mg/dL (ref 8.4–10.5)
Chloride: 99 mEq/L (ref 96–112)
Creatinine, Ser: 1.06 mg/dL (ref 0.40–1.50)
GFR: 74.22 mL/min (ref >60.00)
Glucose, Bld: 149 mg/dL — ABNORMAL HIGH (ref 70–99)
Potassium: 4.4 mEq/L (ref 3.5–5.1)
Sodium: 138 mEq/L (ref 135–145)

## 2021-06-04 LAB — HEPATIC FUNCTION PANEL
ALT: 30 U/L (ref 0–53)
AST: 20 U/L (ref 0–37)
Albumin: 4.3 g/dL (ref 3.5–5.2)
Alkaline Phosphatase: 58 U/L (ref 39–117)
Bilirubin, Direct: 0.1 mg/dL (ref 0.0–0.3)
Total Bilirubin: 0.8 mg/dL (ref 0.2–1.2)
Total Protein: 6.8 g/dL (ref 6.0–8.3)

## 2021-06-04 LAB — LIPID PANEL
Cholesterol: 109 mg/dL (ref 0–200)
HDL: 36.4 mg/dL — ABNORMAL LOW (ref >39.00)
NonHDL: 72.61
Total CHOL/HDL Ratio: 3
Triglycerides: 262 mg/dL — ABNORMAL HIGH (ref 0.0–149.0)
VLDL: 52.4 mg/dL — ABNORMAL HIGH (ref 0.0–40.0)

## 2021-06-04 LAB — CBC WITH DIFFERENTIAL/PLATELET
Basophils Absolute: 0.1 10*3/uL (ref 0.0–0.1)
Basophils Relative: 1.3 % (ref 0.0–3.0)
Eosinophils Absolute: 0.5 10*3/uL (ref 0.0–0.7)
Eosinophils Relative: 6.9 % — ABNORMAL HIGH (ref 0.0–5.0)
HCT: 47.1 % (ref 39.0–52.0)
Hemoglobin: 15.9 g/dL (ref 13.0–17.0)
Lymphocytes Relative: 29.6 % (ref 12.0–46.0)
Lymphs Abs: 1.9 10*3/uL (ref 0.7–4.0)
MCHC: 33.7 g/dL (ref 30.0–36.0)
MCV: 89.3 fl (ref 78.0–100.0)
Monocytes Absolute: 0.6 10*3/uL (ref 0.1–1.0)
Monocytes Relative: 9.1 % (ref 3.0–12.0)
Neutro Abs: 3.5 10*3/uL (ref 1.4–7.7)
Neutrophils Relative %: 53.1 % (ref 43.0–77.0)
Platelets: 238 10*3/uL (ref 150.0–400.0)
RBC: 5.28 Mil/uL (ref 4.22–5.81)
RDW: 13.6 % (ref 11.5–15.5)
WBC: 6.6 10*3/uL (ref 4.0–10.5)

## 2021-06-04 LAB — LDL CHOLESTEROL, DIRECT: Direct LDL: 46 mg/dL

## 2021-06-04 LAB — HEMOGLOBIN A1C: Hgb A1c MFr Bld: 7.5 % — ABNORMAL HIGH (ref 4.6–6.5)

## 2021-06-04 MED ORDER — FLUTICASONE-SALMETEROL 100-50 MCG/ACT IN AEPB
INHALATION_SPRAY | RESPIRATORY_TRACT | 11 refills | Status: DC
  Filled 2021-06-04: qty 60, 30d supply, fill #0

## 2021-06-04 MED ORDER — ATORVASTATIN CALCIUM 40 MG PO TABS
ORAL_TABLET | ORAL | 2 refills | Status: DC
  Filled 2021-06-04 – 2021-07-30 (×2): qty 90, 90d supply, fill #0
  Filled 2021-10-30: qty 90, 90d supply, fill #1

## 2021-06-04 MED FILL — Empagliflozin Tab 10 MG: ORAL | 90 days supply | Qty: 90 | Fill #0 | Status: CN

## 2021-06-04 MED FILL — Sotalol HCl Tab 80 MG: ORAL | 90 days supply | Qty: 180 | Fill #0 | Status: AC

## 2021-06-04 MED FILL — Atorvastatin Calcium Tab 40 MG (Base Equivalent): ORAL | 90 days supply | Qty: 90 | Fill #0 | Status: CN

## 2021-06-04 NOTE — Progress Notes (Signed)
Established Patient Office Visit  Subjective:  Patient ID: Eric Palmer, male    DOB: Jan 08, 1956  Age: 65 y.o. MRN: [id number]  CC:  Chief Complaint  Patient presents with   Follow-up    Follow up labs, fasting    HPI Eric Palmer presents for medical follow-up.  Needs some follow-up labs today.  Also needs flu vaccine.  He has history of paroxysmal atrial fibs/flutter, hypertension, obstructive sleep apnea, type 2 diabetes with peripheral neuropathy, dyslipidemia, metabolic syndrome.  He is on chronic anticoagulation with Xarelto.  Does not monitor sugars regularly.  Last A1c was almost a year ago it was 7.0.  He and his wife recently joined a gym and is exercising more and has lost some weight and feels well overall.  No recent chest pains.  He has diabetic eye exam set up.  Compliant with medications.  Ongoing neuropathy symptoms involving the feet and lower legs.  He takes Lyrica for that.  Past Medical History:  Diagnosis Date   Anal fissure    Chronic bronchitis ([place])    "get it q spring and fall" (10/27/2013)   Coronary artery disease    a. s/p PCI in 1996. b. LHC 01/2006 showed 50% mid LAD, otherwise widely patent cors with minimal irregularities. c. Nuclear stress test in 04/2009 was reportedly low risk, no ischemia. // d. Nuclear stress test 2/19: EF 55, transient ST depression in recovery, no ischemia, inf defect (artifact vs scar)    DM type 2, uncontrolled, with neuropathy [facility])    ED (erectile dysfunction)    Essential hypertension    Hx of echocardiogram    Echo (10/2013): Mild LVH, moderate focal basal hypertrophy of the septum, EF 50-55%, no RWMA   Hyperlipidemia    Internal hemorrhoids without mention of complication    Obesity    Obesity    OSA on CPAP    Paresthesia of both legs    Paroxysmal atrial fibrillation ([place])    a. failed Tikosyn, Multaq. b. s/p ablation of AF/AFL in 12/2013.   Paroxysmal atrial flutter ([place])    a. s/p ablation of AF/AFL in  12/2013.   Smoker    QUIT 10/13/13   Tubular adenoma of colon 2016    Past Surgical History:  Procedure Laterality Date   ABLATION  01-12-2014   PVI and CTI by Dr [name]   ANAL FISSURE REPAIR  ~ 2010   [place] N/A 01/12/2014   Procedure: ATRIAL FIBRILLATION ABLATION;  Surgeon: Coralyn Mark, MD;  Location: [place] CATH LAB;  Service: Cardiovascular;  Laterality: N/A;   CARDIAC CATHETERIZATION  2007   CORONARY ANGIOPLASTY WITH STENT PLACEMENT  1996   residual 50% LAD and RCA by cath in 2007   [place] Right 1963   LEFT HEART CATH AND CORONARY ANGIOGRAPHY N/A 10/11/2017   Procedure: LEFT HEART CATH AND CORONARY ANGIOGRAPHY;  Surgeon: [name], MD;  Location: [place] CV LAB;  Service: Cardiovascular;  Laterality: N/A;   TEE WITHOUT CARDIOVERSION N/A 01/11/2014   Procedure: TRANSESOPHAGEAL ECHOCARDIOGRAM (TEE);  Surgeon: [name], MD;  Location: [facility] ENDOSCOPY;  Service: Cardiovascular;  Laterality: N/A;   TONSILLECTOMY AND ADENOIDECTOMY  1979    Family History  Problem Relation Age of Onset   Heart disease Mother 59       Arrhythmia   Hypertension Mother    Heart disease Father 26  died 61 CHF   Heart attack Father    Hyperlipidemia Other    Heart disease Other    Stroke Neg Hx    Colon cancer Neg Hx    Esophageal cancer Neg Hx    Liver disease Neg Hx    Pancreatic cancer Neg Hx    Stomach cancer Neg Hx     Social History   Socioeconomic History   Marital status: Married    Spouse name: Not on file   Number of children: Not on file   Years of education: Not on file   Highest education level: Not on file  Occupational History   Not on file  Tobacco Use   Smoking status: Former    Packs/day: 1.00    Years: 43.00    Pack years: 43.00    Types: Cigarettes    Quit date: 2020    Years since quitting: 2.7   Smokeless tobacco: Never  Vaping Use   Vaping Use: Never used   Substance and Sexual Activity   Alcohol use: Yes    Alcohol/week: 0.0 standard drinks    Comment: 10/27/2013 "might have 1 beer/month"   Drug use: No   Sexual activity: Yes  Other Topics Concern   Not on file  Social History Narrative   Works as an Programme researcher, broadcasting/film/video for [place] Strain: Not on Art therapist Insecurity: Not on file  Transportation Needs: Not on file  Physical Activity: Not on file  Stress: Not on file  Social Connections: Not on file  Intimate Partner Violence: Not on file    Outpatient Medications Prior to Visit  Medication Sig Dispense Refill   albuterol (VENTOLIN HFA) 108 (90 Base) MCG/ACT inhaler INHALE 2 PUFFS INTO THE LUNGS EVERY 2 HOURS AS NEEDED FOR WHEEZING OR SHORTNESS OF BREATH 8.5 g 1   atorvastatin (LIPITOR) 40 MG tablet TAKE 1 TABLET(40 MG) BY MOUTH DAILY AT 6 PM 90 tablet 2   diltiazem (CARDIZEM CD) 360 MG 24 hr capsule TAKE 1 CAPSULE(360 MG) BY MOUTH DAILY 90 capsule 2   Fluticasone-Salmeterol (ADVAIR DISKUS) 100-50 MCG/DOSE AEPB INHALE 1 PUFF INTO THE LUNGS TWICE DAILY 60 each 11   glucose blood (ONETOUCH VERIO) test strip USE TO CHECK BLOOD SUGAR ONCE DAILY 100 each 0   icosapent Ethyl (VASCEPA) 1 g capsule Take 2 capsules (2 g total) by mouth 2 (two) times daily. 120 capsule 11   JARDIANCE 10 MG TABS tablet TAKE 1 TABLET(10 MG) BY MOUTH DAILY 90 tablet 3   metFORMIN (GLUCOPHAGE-XR) 750 MG 24 hr tablet TAKE 1 TABLET(750 MG) BY MOUTH TWICE DAILY 180 tablet 3   nitroGLYCERIN (NITROSTAT) 0.4 MG SL tablet DISSOLVE 1 TABLET UNDER THE TONGUE EVERY 5 MINUTES FOR 3 DOSES AS NEEDED FOR CHEST PAIN(CALL 911 IF 3RD DOSE IS TAKEN) 25 tablet 7   NONFORMULARY OR COMPOUNDED ITEM Pain cream : ketamine 5%, baclofen 2%, gabapentin 5%, lidocaine 5%, menthol 1% Order faxed to [facility] FINE MISC USE TO CHECK BLOOD SUGAR ONCE DAILY 100 each 0   PEG-KCl-NaCl-NaSulf-Na Asc-C (PLENVU) 140 g  SOLR Take 1 kit by mouth as directed. Use coupon: BIN: [id number] PNC: CNRX Group: [id number] ID: [id number] 1 each 0   pregabalin (LYRICA) 100 MG capsule Take 1 capsule in the morning, 1 capsule at noon, and 2 capsules qhs (Patient taking differently: Take 100 mg by mouth daily.) 360 capsule 1  rivaroxaban (XARELTO) 20 MG TABS tablet TAKE 1 TABLET(20 MG) BY MOUTH DAILY WITH SUPPER 90 tablet 1   sotalol (BETAPACE) 80 MG tablet TAKE 1 TABLET(80 MG) BY MOUTH TWICE DAILY 180 tablet 2   traMADol (ULTRAM) 50 MG tablet TAKE 1 TABLET BY MOUTH THREE TIMES DAILY 90 tablet 0   No facility-administered medications prior to visit.    Allergies  Allergen Reactions   Cephalosporins Anaphylaxis   Penicillins Anaphylaxis and Other (See Comments)    Has patient had a PCN reaction causing immediate rash, facial/tongue/throat swelling, SOB or lightheadedness with hypotension: Yes Has patient had a PCN reaction causing severe rash involving mucus membranes or skin necrosis: No Has patient had a PCN reaction that required hospitalization: No - went to doctors office Has patient had a PCN reaction occurring within the last 10 years: No If all of the above answers are "NO", then may proceed with Cephalosporin use.    Thallium Itching and Rash   Codeine Itching   Gabapentin Other (See Comments)    "made me feel drugged"   Eliquis [Apixaban] Diarrhea    ROS Review of Systems  Constitutional:  Negative for fatigue and unexpected weight change.  Eyes:  Negative for visual disturbance.  Respiratory:  Negative for cough, chest tightness and shortness of breath.   Cardiovascular:  Negative for chest pain, palpitations and leg swelling.  Neurological:  Negative for dizziness, syncope, weakness, light-headedness and headaches.     Objective:    Physical Exam Constitutional:      Appearance: He is well-developed.  HENT:     Right Ear: External ear normal.     Left Ear: External ear normal.  Eyes:      Pupils: Pupils are equal, round, and reactive to light.  Neck:     Thyroid: No thyromegaly.  Cardiovascular:     Rate and Rhythm: Normal rate and regular rhythm.  Pulmonary:     Effort: Pulmonary effort is normal. No respiratory distress.     Breath sounds: Normal breath sounds. No wheezing or rales.  Musculoskeletal:     Cervical back: Neck supple.  Skin:    Comments: has some hemosiderin deposits in the feet and lower legs bilaterally.  Feet are warm to touch.  Good capillary refill.  No calluses or skin lesions.  He does have some mild impairment with sensory function with monofilament but not totally impaired to monofilament  Neurological:     Mental Status: He is alert and oriented to person, place, and time.    BP 130/72 (BP Location: Left Arm, Patient Position: Sitting, Cuff Size: Normal)   Pulse 70   Temp 97.9 F (36.6 C) (Oral)   Wt 241 lb 4.8 oz (109.5 kg)   SpO2 97%   BMI 31.84 kg/m  Wt Readings from Last 3 Encounters:  06/04/21 241 lb 4.8 oz (109.5 kg)  04/23/21 241 lb (109.3 kg)  02/25/21 240 lb (108.9 kg)     Health Maintenance Due  Topic Date Due   Zoster Vaccines- Shingrix (1 of 2) Never done   COVID-19 Vaccine (2 - Booster for Janssen series) 04/18/2020   INFLUENZA VACCINE  03/24/2021   URINE MICROALBUMIN  07/01/2021    There are no preventive care reminders to display for this patient.  Lab Results  Component Value Date   TSH 1.13 07/01/2020   Lab Results  Component Value Date   WBC 8.1 10/03/2020   HGB 16.1 10/03/2020   HCT 47.5 10/03/2020   MCV  89.0 10/03/2020   PLT 229.0 10/03/2020   Lab Results  Component Value Date   NA 139 07/01/2020   K 4.4 07/01/2020   CO2 25 07/01/2020   GLUCOSE 164 (H) 07/01/2020   BUN 18 07/01/2020   CREATININE 1.23 07/01/2020   BILITOT 0.7 07/01/2020   ALKPHOS 71 06/29/2019   AST 17 07/01/2020   ALT 25 07/01/2020   PROT 7.2 07/01/2020   ALBUMIN 4.4 06/29/2019   CALCIUM 9.6 07/01/2020   ANIONGAP 7  08/15/2017   GFR 94.63 08/12/2018   Lab Results  Component Value Date   CHOL 132 07/01/2020   Lab Results  Component Value Date   HDL 36 (L) 07/01/2020   Lab Results  Component Value Date   [employer]  07/01/2020     Comment:     . LDL cholesterol not calculated. Triglyceride levels greater than 400 mg/dL invalidate calculated LDL results. . Reference range: <100 . Desirable range <100 mg/dL for primary prevention;   <70 mg/dL for patients with CHD or diabetic patients  with > or = 2 CHD risk factors. [name] LDL-C is now calculated using the Martin-Hopkins  calculation, which is a validated novel method providing  better accuracy than the Friedewald equation in the  estimation of LDL-C.  Cresenciano Genre et al. Annamaria Helling. 7253;664(40): 2061-2068  (http://education.[employer].com/faq/FAQ164)    Lab Results  Component Value Date   TRIG 504 (H) 07/01/2020   Lab Results  Component Value Date   CHOLHDL 3.7 07/01/2020   Lab Results  Component Value Date   HGBA1C 7.0 (A) 07/01/2020   HGBA1C 7.0 07/01/2020   HGBA1C 7.0 (A) 07/01/2020   HGBA1C 7.0 07/01/2020      Assessment & Plan:   Problem List Items Addressed This Visit       Unprioritized   Essential hypertension - Primary   Relevant Orders   Basic metabolic panel   Hyperlipidemia   Relevant Orders   Lipid panel   Hepatic function panel   Type 2 diabetes mellitus ([place])   Relevant Orders   Hemoglobin A1c   Other Visit Diagnoses     High risk medication use       Relevant Orders   CBC with Differential/Platelet     -Flu vaccine given -Check labs as above -Continue healthy lifestyle habits with regular exercise -Recommend 42-monthfollow-up unless indicated sooner by labs  No orders of the defined types were placed in this encounter.   Follow-up: Return in about 6 months (around 12/03/2021).    BCarolann Littler MD

## 2021-06-04 NOTE — Addendum Note (Signed)
Addended by: Amanda Cockayne on: 06/04/2021 07:42 AM   Modules accepted: Orders

## 2021-06-05 ENCOUNTER — Other Ambulatory Visit: Payer: Self-pay

## 2021-06-05 ENCOUNTER — Other Ambulatory Visit (HOSPITAL_BASED_OUTPATIENT_CLINIC_OR_DEPARTMENT_OTHER): Payer: Self-pay

## 2021-06-05 MED ORDER — EMPAGLIFLOZIN 25 MG PO TABS
25.0000 mg | ORAL_TABLET | Freq: Every day | ORAL | 1 refills | Status: DC
  Filled 2021-06-05: qty 90, 90d supply, fill #0
  Filled 2021-09-04: qty 30, 30d supply, fill #1
  Filled 2021-10-20 – 2021-10-30 (×2): qty 30, 30d supply, fill #2
  Filled 2021-11-27: qty 21, 21d supply, fill #3

## 2021-06-11 ENCOUNTER — Other Ambulatory Visit (HOSPITAL_BASED_OUTPATIENT_CLINIC_OR_DEPARTMENT_OTHER): Payer: Self-pay

## 2021-06-11 ENCOUNTER — Other Ambulatory Visit: Payer: Self-pay

## 2021-06-11 ENCOUNTER — Encounter: Payer: Self-pay | Admitting: Podiatry

## 2021-06-11 ENCOUNTER — Encounter: Payer: Self-pay | Admitting: Family Medicine

## 2021-06-11 DIAGNOSIS — I480 Paroxysmal atrial fibrillation: Secondary | ICD-10-CM

## 2021-06-11 MED ORDER — PREGABALIN 100 MG PO CAPS
ORAL_CAPSULE | ORAL | 1 refills | Status: DC
  Filled 2021-06-11: qty 360, 90d supply, fill #0

## 2021-06-11 MED ORDER — RIVAROXABAN 20 MG PO TABS
ORAL_TABLET | ORAL | 1 refills | Status: DC
  Filled 2021-06-11: qty 90, 90d supply, fill #0
  Filled 2021-09-08: qty 90, 90d supply, fill #1

## 2021-06-11 NOTE — Telephone Encounter (Signed)
Last filled 03/13/2019 Last OV 06/04/2021  Ok to fill?

## 2021-06-11 NOTE — Telephone Encounter (Signed)
Patient has not been seen since 1/22,please schedule for a f/u appointment for medication refill.

## 2021-06-11 NOTE — Telephone Encounter (Signed)
Pt last saw Dr Tamala Julian 09/20/20, last labs 06/04/21 Creat 1.06, age 65, weight 109.5kg, CrCl 109.04, based on CrCl pt is on appropriate dosage of Xarelto 20mg  QD for afib.  Will refill rx.

## 2021-06-12 ENCOUNTER — Other Ambulatory Visit (HOSPITAL_BASED_OUTPATIENT_CLINIC_OR_DEPARTMENT_OTHER): Payer: Self-pay

## 2021-06-16 ENCOUNTER — Other Ambulatory Visit (HOSPITAL_BASED_OUTPATIENT_CLINIC_OR_DEPARTMENT_OTHER): Payer: Self-pay

## 2021-06-16 ENCOUNTER — Other Ambulatory Visit: Payer: Self-pay

## 2021-06-16 ENCOUNTER — Ambulatory Visit: Payer: BC Managed Care – PPO | Admitting: Podiatry

## 2021-06-16 DIAGNOSIS — E1140 Type 2 diabetes mellitus with diabetic neuropathy, unspecified: Secondary | ICD-10-CM | POA: Diagnosis not present

## 2021-06-16 DIAGNOSIS — E1149 Type 2 diabetes mellitus with other diabetic neurological complication: Secondary | ICD-10-CM

## 2021-06-16 DIAGNOSIS — E0841 Diabetes mellitus due to underlying condition with diabetic mononeuropathy: Secondary | ICD-10-CM

## 2021-06-16 MED ORDER — TRAMADOL HCL 50 MG PO TABS: 50.0000 mg | ORAL_TABLET | Freq: Three times a day (TID) | ORAL | 2 refills | Status: DC

## 2021-06-16 MED ORDER — TRAMADOL HCL 50 MG PO TABS
ORAL_TABLET | ORAL | 2 refills | Status: DC
  Filled 2021-06-16: qty 90, 30d supply, fill #0

## 2021-06-17 ENCOUNTER — Other Ambulatory Visit: Payer: Self-pay | Admitting: Internal Medicine

## 2021-06-17 DIAGNOSIS — I480 Paroxysmal atrial fibrillation: Secondary | ICD-10-CM

## 2021-06-17 NOTE — Progress Notes (Signed)
Subjective:   Patient ID: Eric Palmer, male   DOB: 65 y.o.   MRN: [id number]   HPI Patient presents stating he is still getting a lot of pain in both his feet and states that it has been more debilitating and that he has been taking Lyrica and has to take tramadol and that the pain is burning and worse at night but it is becoming more of an issue with long-term history of diabetes under reasonably good control now but periods of not good control   ROS      Objective:  Physical Exam  Significant diminishment of neurological Sharp dull vibratory with multiple signs of neuropathic-like condition secondary to long-term health with long-term diabetes with failure to respond to aggressive Lyrica and tramadol treatment     Assessment:  Chronic condition that is worsening and is not responding to medication as well as it used to with concerns about tramadol usage     Plan:  H&P reviewed condition with patient at great length.  I do think we could consider Qutenza for this patient to try to reduce his symptoms and hopefully reduce his dependence on tramadol.  Reviewed this with him and we will proceed on this course to try to help improve his condition

## 2021-06-17 NOTE — Telephone Encounter (Signed)
Xarelto refill was sent on 06/11/21 to [facility] at [place]; called pt to verify if we need to send to [facility], He stated he is no longer using [facility] and has picked up the refill at [facility]. Will deny this one. Also, asked pt if I could remove [facility] from list & he stated he did advised it is still there, so removed at this time.

## 2021-06-26 ENCOUNTER — Encounter: Payer: Self-pay | Admitting: Gastroenterology

## 2021-06-30 DIAGNOSIS — E119 Type 2 diabetes mellitus without complications: Secondary | ICD-10-CM | POA: Diagnosis not present

## 2021-06-30 DIAGNOSIS — H02831 Dermatochalasis of right upper eyelid: Secondary | ICD-10-CM | POA: Diagnosis not present

## 2021-06-30 DIAGNOSIS — H2513 Age-related nuclear cataract, bilateral: Secondary | ICD-10-CM | POA: Diagnosis not present

## 2021-06-30 DIAGNOSIS — H02834 Dermatochalasis of left upper eyelid: Secondary | ICD-10-CM | POA: Diagnosis not present

## 2021-07-01 ENCOUNTER — Encounter: Payer: Self-pay | Admitting: Family Medicine

## 2021-07-01 LAB — HM DIABETES EYE EXAM

## 2021-07-03 ENCOUNTER — Encounter: Payer: Self-pay | Admitting: Podiatry

## 2021-07-03 ENCOUNTER — Other Ambulatory Visit: Payer: Self-pay

## 2021-07-03 ENCOUNTER — Ambulatory Visit: Payer: BC Managed Care – PPO | Admitting: Podiatry

## 2021-07-03 DIAGNOSIS — E1149 Type 2 diabetes mellitus with other diabetic neurological complication: Secondary | ICD-10-CM

## 2021-07-03 DIAGNOSIS — E1140 Type 2 diabetes mellitus with diabetic neuropathy, unspecified: Secondary | ICD-10-CM

## 2021-07-03 NOTE — Progress Notes (Signed)
Subjective:   Patient ID: Eric Palmer, male   DOB: 65 y.o.   MRN: [id number]   HPI Patient presents with chronic diabetic neuropathy that has made life miserable and creates a lot of burning shooting pain   ROS      Objective:  Physical Exam  Neurovascular status found to be diminished as far as sharp dull vibratory DTR reflexes with chronic pain plantar both the     Assessment:  Chronic diabetic neuropathy bilateral feet failure to respond to numerous conservative treatments     Plan:  H&P reviewed conditions and at this point both feet were marked for the areas of neuropathic disease which encompasses the entire bottom of the feet and Qutenza was applied to his feet which took approximate 30-minute process and he spent 30 minutes allowing the absorption of the material and left the office in satisfactory condition

## 2021-07-04 ENCOUNTER — Encounter: Payer: Self-pay | Admitting: Family Medicine

## 2021-07-04 ENCOUNTER — Encounter: Payer: Self-pay | Admitting: Cardiology

## 2021-07-04 ENCOUNTER — Other Ambulatory Visit (HOSPITAL_BASED_OUTPATIENT_CLINIC_OR_DEPARTMENT_OTHER): Payer: Self-pay

## 2021-07-04 ENCOUNTER — Ambulatory Visit: Payer: BC Managed Care – PPO | Admitting: Cardiology

## 2021-07-04 ENCOUNTER — Other Ambulatory Visit: Payer: Self-pay

## 2021-07-04 VITALS — BP 126/70 | HR 72 | Ht 73.0 in | Wt 239.4 lb

## 2021-07-04 DIAGNOSIS — G4733 Obstructive sleep apnea (adult) (pediatric): Secondary | ICD-10-CM | POA: Diagnosis not present

## 2021-07-04 DIAGNOSIS — I4892 Unspecified atrial flutter: Secondary | ICD-10-CM | POA: Diagnosis not present

## 2021-07-04 DIAGNOSIS — I10 Essential (primary) hypertension: Secondary | ICD-10-CM

## 2021-07-04 DIAGNOSIS — Z87891 Personal history of nicotine dependence: Secondary | ICD-10-CM

## 2021-07-04 DIAGNOSIS — F17210 Nicotine dependence, cigarettes, uncomplicated: Secondary | ICD-10-CM

## 2021-07-04 MED ORDER — SALINE SPRAY 0.65 % NA SOLN: 2.0000 | Freq: Two times a day (BID) | NASAL | 0 refills | Status: DC

## 2021-07-04 NOTE — Patient Instructions (Addendum)
Dr. Radford Pax recommends that you start using Biotene mouthwash and get a humidifier for you bedroom.   Medication Instructions:  Your physician has recommended you make the following change in your medication:  1) START using nasal saline spray - two sprays in each nostril twice daily.   *If you need a refill on your cardiac medications before your next appointment, please call your pharmacy*   Follow-Up: At [facility], you and your health needs are our priority.  As part of our continuing mission to provide you with exceptional heart care, we have created designated Provider Care Teams.  These Care Teams include your primary Cardiologist (physician) and Advanced Practice Providers (APPs -  Physician Assistants and Nurse Practitioners) who all work together to provide you with the care you need, when you need it.  Your next appointment:   1 year(s)  The format for your next appointment:   In Person  Provider:   Fransico Him, MD

## 2021-07-04 NOTE — Addendum Note (Signed)
Addended by: Antonieta Iba on: 07/04/2021 01:23 PM   Modules accepted: Orders

## 2021-07-04 NOTE — Progress Notes (Signed)
Cardiology Note    Date:  07/04/2021   ID:  [name], DOB 1956/01/11, MRN [id number]  PCP:  [name], MD  Cardiologist:  Fransico Him, MD   Chief Complaint  Patient presents with   Sleep Apnea   Atrial Fibrillation   Hypertension     History of Present Illness:  Eric Palmer is a 65 y.o. male who is being seen today for the evaluation of OSA at the request of Burchette, Alinda Sierras, MD.  Eric Palmer is a 65 y.o. male with a hx of PAF/flutter, HTN, OSA, DM 2 with peripheral neuropathy, HLD and metabolic syndrome.  He is on chronic anticoagulation with Xarelto.  AF CAD status post PCI 1996.  He is followed by Dr. Tamala Julian for his coronary disease.  He is now here to reestablish care for his sleep apnea.  He had been doing well with his CPAP but over the past few months he has had some issues.  He has been very busy traveling and with the time change he has had problems sleeping.  He also has had problems with getting caught up in his tubing.  He recently started exercising but not until the evening around the same time he had problems getting to sleep. He is walking 2 miles and playing pickle ball.   He gets up early to start working on his computer in the am instead of exercising.  He tolerates the mask and feels the pressure is adequate. He feels rested in the am and gets up at 4am and feels rested.  He does not get sleepy during the day.  He has had some significant problems with mouth and nasal dryness or nasal congestion.  He does not think that he snores.     Past Medical History:  Diagnosis Date   Anal fissure    Chronic bronchitis ([place])    "get it q spring and fall" (10/27/2013)   Coronary artery disease    a. s/p PCI in 1996. b. LHC 01/2006 showed 50% mid LAD, otherwise widely patent cors with minimal irregularities. c. Nuclear stress test in 04/2009 was reportedly low risk, no ischemia. // d. Nuclear stress test 2/19: EF 55, transient ST depression in  recovery, no ischemia, inf defect (artifact vs scar)    DM type 2, uncontrolled, with neuropathy    ED (erectile dysfunction)    Essential hypertension    Hx of echocardiogram    Echo (10/2013): Mild LVH, moderate focal basal hypertrophy of the septum, EF 50-55%, no RWMA   Hyperlipidemia    Internal hemorrhoids without mention of complication    Obesity    Obesity    OSA on CPAP    Paresthesia of both legs    Paroxysmal atrial fibrillation ([place])    a. failed Tikosyn, Multaq. b. s/p ablation of AF/AFL in 12/2013.   Paroxysmal atrial flutter ([place])    a. s/p ablation of AF/AFL in 12/2013.   Smoker    QUIT 10/13/13   Tubular adenoma of colon 2016    Past Surgical History:  Procedure Laterality Date   ABLATION  01-12-2014   PVI and CTI by Dr [name]   ANAL FISSURE REPAIR  ~ 2010   [place] N/A 01/12/2014   Procedure: ATRIAL FIBRILLATION ABLATION;  Surgeon: Coralyn Mark, MD;  Location: [facility] CATH [facility];  Service: Cardiovascular;  Laterality: N/A;   CARDIAC CATHETERIZATION  2007   CORONARY ANGIOPLASTY WITH STENT PLACEMENT  1996   residual 50% LAD and RCA by cath in 2007   [place] Right 1963   LEFT HEART CATH AND CORONARY ANGIOGRAPHY N/A 10/11/2017   Procedure: LEFT HEART CATH AND CORONARY ANGIOGRAPHY;  Surgeon: [name], MD;  Location: [facility] CV LAB;  Service: Cardiovascular;  Laterality: N/A;   TEE WITHOUT CARDIOVERSION N/A 01/11/2014   Procedure: TRANSESOPHAGEAL ECHOCARDIOGRAM (TEE);  Surgeon: [name], MD;  Location: [facility];  Service: Cardiovascular;  Laterality: N/A;   TONSILLECTOMY AND ADENOIDECTOMY  1979    Current Medications: Current Meds  Medication Sig   albuterol (VENTOLIN HFA) 108 (90 Base) MCG/ACT inhaler INHALE 2 PUFFS INTO THE LUNGS EVERY 2 HOURS AS NEEDED FOR WHEEZING OR SHORTNESS OF BREATH   atorvastatin (LIPITOR) 40 MG tablet Take 1 tablet by mouth daily at 6 pm    diltiazem (CARDIZEM CD) 360 MG 24 hr capsule TAKE 1 CAPSULE(360 MG) BY MOUTH DAILY   empagliflozin (JARDIANCE) 25 MG TABS tablet Take 1 tablet (25 mg total) by mouth daily.   Fluticasone-Salmeterol (ADVAIR DISKUS) 100-50 MCG/DOSE AEPB INHALE 1 PUFF INTO THE LUNGS TWICE DAILY   glucose blood (ONETOUCH VERIO) test strip USE TO CHECK BLOOD SUGAR ONCE DAILY   icosapent Ethyl (VASCEPA) 1 g capsule Take 2 capsules (2 g total) by mouth 2 (two) times daily.   metFORMIN (GLUCOPHAGE-XR) 750 MG 24 hr tablet TAKE 1 TABLET(750 MG) BY MOUTH TWICE DAILY   nitroGLYCERIN (NITROSTAT) 0.4 MG SL tablet DISSOLVE 1 TABLET UNDER THE TONGUE EVERY 5 MINUTES FOR 3 DOSES AS NEEDED FOR CHEST PAIN(CALL 911 IF 3RD DOSE IS TAKEN)   NONFORMULARY OR COMPOUNDED ITEM Pain cream : ketamine 5%, baclofen 2%, gabapentin 5%, lidocaine 5%, menthol 1% Order faxed to [facility] USE TO CHECK BLOOD SUGAR ONCE DAILY   PEG-KCl-NaCl-NaSulf-Na Asc-C (PLENVU) 140 g SOLR Take 1 kit by mouth as directed. Use coupon: BIN: 675449 PNC: CNRX Group: [id number] ID: [id number]   pregabalin (LYRICA) 100 MG capsule Take 1 capsule in the morning, 1 capsule at noon, and 2 capsules nightly at bedtime   rivaroxaban (XARELTO) 20 MG TABS tablet TAKE 1 TABLET(20 MG) BY MOUTH DAILY WITH SUPPER   sotalol (BETAPACE) 80 MG tablet TAKE 1 TABLET(80 MG) BY MOUTH TWICE DAILY   traMADol (ULTRAM) 50 MG tablet Take 1 tablet (50 mg total) by mouth 3 (three) times daily.    Allergies:   Cephalosporins, Penicillins, Thallium, Codeine, Gabapentin, and Eliquis [apixaban]   Social History   Socioeconomic History   Marital status: Married    Spouse name: Not on file   Number of children: Not on file   Years of education: Not on file   Highest education level: Not on file  Occupational History   Not on file  Tobacco Use   Smoking status: Former    Packs/day: 1.00    Years: 43.00    Pack years: 43.00    Types: Cigarettes     Quit date: 2020    Years since quitting: 2.8   Smokeless tobacco: Never  Vaping Use   Vaping Use: Never used  Substance and Sexual Activity   Alcohol use: Yes    Alcohol/week: 0.0 standard drinks    Comment: 10/27/2013 "might have 1 beer/month"   Drug use: No   Sexual activity: Yes  Other Topics Concern   Not on file  Social History Narrative  Works as an Programme researcher, broadcasting/film/video for [employer] Strain: Not on [employer] Insecurity: Not on file  Transportation Needs: Not on file  Physical Activity: Not on file  Stress: Not on file  Social Connections: Not on file     Family History:  The patient's family history includes Heart attack in his father; Heart disease in an other family member; Heart disease (age of onset: 48) in his father; Heart disease (age of onset: 54) in his mother; Hyperlipidemia in an other family member; Hypertension in his mother.   ROS:   Please see the history of present illness.    ROS All other systems reviewed and are negative.  No flowsheet data found.     PHYSICAL EXAM:   VS:  BP 126/70   Pulse 72   Ht 6' 1" (1.854 m)   Wt 239 lb 6.4 oz (108.6 kg)   SpO2 96%   BMI 31.59 kg/m    GEN: Well nourished, well developed, in no acute distress  HEENT: normal  Neck: no JVD, carotid bruits, or masses Cardiac: RRR; no murmurs, rubs, or gallops,no edema.  Intact distal pulses bilaterally.  Respiratory:  clear to auscultation bilaterally, normal work of breathing GI: soft, nontender, nondistended, + BS MS: no deformity or atrophy  Skin: warm and dry, no rash Neuro:  Alert and Oriented x 3, Strength and sensation are intact Psych: euthymic mood, full affect  Wt Readings from Last 3 Encounters:  07/04/21 239 lb 6.4 oz (108.6 kg)  06/04/21 241 lb 4.8 oz (109.5 kg)  04/23/21 241 lb (109.3 kg)      Studies/Labs Reviewed:   EKG:  EKG is not ordered today.    Recent Labs: 06/04/2021: ALT 30; BUN 12;  Creatinine, Ser 1.06; Hemoglobin 15.9; Platelets 238.0; Potassium 4.4; Sodium 138   Lipid Panel    Component Value Date/Time   CHOL 109 06/04/2021 0742   CHOL 130 06/29/2019 0724   TRIG 262.0 (H) 06/04/2021 0742   HDL 36.40 (L) 06/04/2021 0742   HDL 35 (L) 06/29/2019 0724   CHOLHDL 3 06/04/2021 0742   VLDL 52.4 (H) 06/04/2021 0742   LDLCALC  07/01/2020 1411     Comment:     . LDL cholesterol not calculated. Triglyceride levels greater than 400 mg/dL invalidate calculated LDL results. . Reference range: <100 . Desirable range <100 mg/dL for primary prevention;   <70 mg/dL for patients with CHD or diabetic patients  with > or = 2 CHD risk factors. [name] LDL-C is now calculated using the Martin-Hopkins  calculation, which is a validated novel method providing  better accuracy than the Friedewald equation in the  estimation of LDL-C.  Cresenciano Genre et al. Annamaria Helling. 9702;637(85): 2061-2068  (http://education.QuestDiagnostics.com/faq/FAQ164)    LDLDIRECT 46.0 06/04/2021 0742     CHA2DS2-VASc Score = 3   This indicates a 3.2% annual risk of stroke. The patient's score is based upon: CHF History: 0 HTN History: 1 Diabetes History: 1 Stroke History: 0 Vascular Disease History: 1 Age Score: 0 Gender Score: 0   Additional studies/ records that were reviewed today include:  Prior OV notes and echo    ASSESSMENT:    1. Obstructive sleep apnea   2. Paroxysmal atrial flutter ([name])   3. Essential hypertension      PLAN:  In order of problems listed above:  OSA - The patient is tolerating PAP therapy well without any problems. The PAP download performed by  his DME was personally reviewed and interpreted by me today and showed an AHI of 2.5/hr on auto BiPAP cm H2O with 77% compliance in using more than 4 hours nightly.  The patient has been using and benefiting from PAP use and will continue to benefit from therapy.   -I encouraged him to get a humidifier for his room to help with  his device running out of water -encouraged him to use nasal saline spray 2 sprays each nostril BID and biotene mouthwash    2.  Paroxysmal atrial fibrillation/flutter -He failed Tikosyn and Multaq -Status post a flutter/fibrillation ablation 2015 -He has not had any recurrence of atrial fibrillation or flutter. -He will continue prescription drug management with sotalol 80 mg twice daily, Cardizem CD3 160 mg daily and Xarelto 20 mg daily with as needed refills  3.  Hypertension -BP is well controlled on exam today -Continue prescription drug management with Cardizem CD 360 mg daily with as needed refills  Time Spent: 20 minutes total time of encounter, including 15 minutes spent in face-to-face patient care on the date of this encounter. This time includes coordination of care and counseling regarding above mentioned problem list. Remainder of non-face-to-face time involved reviewing chart documents/testing relevant to the patient encounter and documentation in the medical record. I have independently reviewed documentation from referring provider  Medication Adjustments/Labs and Tests Ordered: Current medicines are reviewed at length with the patient today.  Concerns regarding medicines are outlined above.  Medication changes, Labs and Tests ordered today are listed in the Patient Instructions below.  There are no Patient Instructions on file for this visit.   Signed, Fransico Him, MD  07/04/2021 12:59 PM    [address], [place], [place]  [address] Phone: [phone]; Fax: [phone]

## 2021-07-07 ENCOUNTER — Encounter: Payer: BC Managed Care – PPO | Admitting: Gastroenterology

## 2021-07-07 ENCOUNTER — Other Ambulatory Visit (HOSPITAL_BASED_OUTPATIENT_CLINIC_OR_DEPARTMENT_OTHER): Payer: Self-pay

## 2021-07-07 MED ORDER — METFORMIN HCL ER 750 MG PO TB24
ORAL_TABLET | ORAL | 3 refills | Status: DC
  Filled 2021-07-07: qty 180, 90d supply, fill #0
  Filled 2021-10-04: qty 180, 90d supply, fill #1
  Filled 2022-01-04: qty 180, 90d supply, fill #2
  Filled 2022-04-04: qty 180, 90d supply, fill #3

## 2021-07-15 DIAGNOSIS — L918 Other hypertrophic disorders of the skin: Secondary | ICD-10-CM | POA: Diagnosis not present

## 2021-07-15 DIAGNOSIS — L570 Actinic keratosis: Secondary | ICD-10-CM | POA: Diagnosis not present

## 2021-07-15 DIAGNOSIS — Z789 Other specified health status: Secondary | ICD-10-CM | POA: Diagnosis not present

## 2021-07-15 DIAGNOSIS — L820 Inflamed seborrheic keratosis: Secondary | ICD-10-CM | POA: Diagnosis not present

## 2021-07-15 DIAGNOSIS — L538 Other specified erythematous conditions: Secondary | ICD-10-CM | POA: Diagnosis not present

## 2021-07-28 ENCOUNTER — Ambulatory Visit
Admission: RE | Admit: 2021-07-28 | Discharge: 2021-07-28 | Disposition: A | Discharge status: Home / Self Care | Payer: Self-pay | Source: Ambulatory Visit | Attending: Acute Care | Admitting: Acute Care

## 2021-07-28 DIAGNOSIS — F17210 Nicotine dependence, cigarettes, uncomplicated: Secondary | ICD-10-CM

## 2021-07-28 DIAGNOSIS — Z87891 Personal history of nicotine dependence: Secondary | ICD-10-CM

## 2021-07-30 ENCOUNTER — Other Ambulatory Visit (HOSPITAL_BASED_OUTPATIENT_CLINIC_OR_DEPARTMENT_OTHER): Payer: Self-pay

## 2021-07-30 ENCOUNTER — Other Ambulatory Visit: Payer: Self-pay | Admitting: Podiatry

## 2021-07-30 MED FILL — Diltiazem HCl Coated Beads Cap ER 24HR 360 MG: ORAL | 90 days supply | Qty: 90 | Fill #0 | Status: AC

## 2021-07-31 ENCOUNTER — Other Ambulatory Visit (HOSPITAL_BASED_OUTPATIENT_CLINIC_OR_DEPARTMENT_OTHER): Payer: Self-pay

## 2021-08-01 ENCOUNTER — Other Ambulatory Visit (HOSPITAL_BASED_OUTPATIENT_CLINIC_OR_DEPARTMENT_OTHER): Payer: Self-pay

## 2021-08-06 ENCOUNTER — Other Ambulatory Visit (HOSPITAL_BASED_OUTPATIENT_CLINIC_OR_DEPARTMENT_OTHER): Payer: Self-pay

## 2021-08-06 ENCOUNTER — Other Ambulatory Visit: Payer: Self-pay | Admitting: Family Medicine

## 2021-08-06 MED ORDER — ALBUTEROL SULFATE HFA 108 (90 BASE) MCG/ACT IN AERS
INHALATION_SPRAY | RESPIRATORY_TRACT | 1 refills | Status: DC
  Filled 2021-08-06: qty 8.5, 17d supply, fill #0
  Filled 2021-09-04: qty 8.5, 17d supply, fill #1

## 2021-08-06 MED FILL — Fluticasone-Salmeterol Aer Powder BA 100-50 MCG/ACT: RESPIRATORY_TRACT | 30 days supply | Qty: 60 | Fill #0 | Status: AC

## 2021-08-07 ENCOUNTER — Other Ambulatory Visit (HOSPITAL_BASED_OUTPATIENT_CLINIC_OR_DEPARTMENT_OTHER): Payer: Self-pay

## 2021-08-08 ENCOUNTER — Other Ambulatory Visit (HOSPITAL_BASED_OUTPATIENT_CLINIC_OR_DEPARTMENT_OTHER): Payer: Self-pay

## 2021-08-13 ENCOUNTER — Encounter: Payer: Self-pay | Admitting: Orthopaedic Surgery

## 2021-08-13 ENCOUNTER — Ambulatory Visit: Payer: BC Managed Care – PPO | Admitting: Orthopaedic Surgery

## 2021-08-13 ENCOUNTER — Other Ambulatory Visit: Payer: Self-pay | Admitting: Acute Care

## 2021-08-13 ENCOUNTER — Ambulatory Visit: Payer: BC Managed Care – PPO

## 2021-08-13 ENCOUNTER — Other Ambulatory Visit: Payer: Self-pay

## 2021-08-13 DIAGNOSIS — Z87891 Personal history of nicotine dependence: Secondary | ICD-10-CM

## 2021-08-13 DIAGNOSIS — M25562 Pain in left knee: Secondary | ICD-10-CM | POA: Diagnosis not present

## 2021-08-13 NOTE — Progress Notes (Signed)
Office Visit Note   Patient: Eric Palmer           Date of Birth: Sep 12, 1955           MRN: [id number] Visit Date: 08/13/2021              Requested by: [name], MD [place],  [place] [address] PCP: [name], MD   Assessment & Plan: Visit Diagnoses:  1. Acute pain of left knee     Plan: Impression is left knee lateral meniscus tear.  Today, we discussed various treatment options to include intra-articular cortisone injection for which she would like to proceed.  If his symptoms have not improved over the next several weeks he will let us know we will order an MRI to assess for structural abnormalities.  Otherwise, follow-up with [place] as needed.  Follow-Up Instructions: Return if symptoms worsen or fail to improve.   Orders:  Orders Placed This Encounter  Procedures   Large Joint Inj: L knee   XR KNEE 3 VIEW LEFT   No orders of the defined types were placed in this encounter.     Procedures: Large Joint Inj: L knee on 08/13/2021 9:04 AM Indications: pain Details: 22 G needle, anterolateral approach Medications: 2 mL lidocaine 1 %; 2 mL bupivacaine 0.25 %; 40 mg methylPREDNISolone acetate 40 MG/ML     Clinical Data: No additional findings.   Subjective: Chief Complaint  Patient presents with   Left Knee - New Patient (Initial Visit)    HPI patient is a very pleasant 65 year old gentleman who comes in today with left knee pain for the past week.  He was playing pickle ball when he pivoted and felt a sharp shooting pain to the lateral aspect.  He notes that today his pain is slightly improved.  He denies any previous pain to the left knee.  The pain he has is to the lateral aspect and is worse with any twisting motions.  He denies any swelling or mechanical symptoms.  No instability.  Review of Systems as detailed in HPI.  All other reviewed and are negative.   Objective: Vital Signs: There were no vitals taken for this  visit.  Physical Exam well-developed well-nourished gentleman in no acute distress.  Alert and oriented x3.  Ortho Exam left knee exam shows a small effusion.  Range of motion 0 to 125 degrees.  No joint line tenderness.  Mild patellofemoral crepitus.  Stable to valgus varus stress.  Is neurovascular intact distally.  Specialty Comments:  No specialty comments available.  Imaging: No results found.   PMFS History: Patient Active Problem List   Diagnosis Date Noted   Hx of adenomatous colonic polyps 04/23/2021   Erectile dysfunction 04/12/2018   Diabetic neuropathy ([place]) 10/23/2015   Tinnitus of both ears 10/23/2015   Coronary artery disease involving native coronary artery of native heart with angina pectoris ([place]) 06/26/2015   Essential hypertension    Paroxysmal atrial flutter ([place])    Hyperlipidemia    Right carotid bruit 02/26/2015   Chronic anticoagulation 02/25/2015   Rectal bleeding 10/04/2014   Obesity (BMI 30-39.9) 07/10/2013   Atrial fibrillation with RVR ([place]) 06/03/2012   COPD (chronic obstructive pulmonary disease) ([place]) 06/03/2012   Type 2 diabetes mellitus ([place]) 06/03/2012   Tobacco use disorder 93/26/7124   Metabolic syndrome 58/04/9832   Obstructive sleep apnea 11/08/2008   ALLERGIC RHINITIS 11/08/2008   Asthma 11/08/2008   Past Medical History:  Diagnosis Date   Anal fissure    Chronic bronchitis ([place])    "get it q spring and fall" (10/27/2013)   Coronary artery disease    a. s/p PCI in 1996. b. LHC 01/2006 showed 50% mid LAD, otherwise widely patent cors with minimal irregularities. c. Nuclear stress test in 04/2009 was reportedly low risk, no ischemia. // d. Nuclear stress test 2/19: EF 55, transient ST depression in recovery, no ischemia, inf defect (artifact vs scar)    DM type 2, uncontrolled, with neuropathy    ED (erectile dysfunction)    Essential hypertension    Hx of echocardiogram    Echo (10/2013): Mild LVH, moderate focal basal hypertrophy of  the septum, EF 50-55%, no RWMA   Hyperlipidemia    Internal hemorrhoids without mention of complication    Obesity    Obesity    OSA on CPAP    Paresthesia of both legs    Paroxysmal atrial fibrillation ([place])    a. failed Tikosyn, Multaq. b. s/p ablation of AF/AFL in 12/2013.   Paroxysmal atrial flutter ([place])    a. s/p ablation of AF/AFL in 12/2013.   Smoker    QUIT 10/13/13   Tubular adenoma of colon 2016    Family History  Problem Relation Age of Onset   Heart disease Mother 32       Arrhythmia   Hypertension Mother    Heart disease Father 60       died 52 CHF   Heart attack Father    Hyperlipidemia Other    Heart disease Other    Stroke Neg Hx    Colon cancer Neg Hx    Esophageal cancer Neg Hx    Liver disease Neg Hx    Pancreatic cancer Neg Hx    Stomach cancer Neg Hx     Past Surgical History:  Procedure Laterality Date   ABLATION  01-12-2014   PVI and CTI by Dr [name]   ANAL FISSURE REPAIR  ~ 2010   ATRIAL FIBRILLATION ABLATION N/A 01/12/2014   Procedure: ATRIAL FIBRILLATION ABLATION;  Surgeon: Coralyn Mark, MD;  Location: [facility] CATH LAB;  Service: Cardiovascular;  Laterality: N/A;   CARDIAC CATHETERIZATION  2007   CORONARY ANGIOPLASTY WITH STENT PLACEMENT  1996   residual 50% LAD and RCA by cath in 2007   [place] Right 1963   LEFT HEART CATH AND CORONARY ANGIOGRAPHY N/A 10/11/2017   Procedure: LEFT HEART CATH AND CORONARY ANGIOGRAPHY;  Surgeon: [name], MD;  Location: [facility] CV LAB;  Service: Cardiovascular;  Laterality: N/A;   TEE WITHOUT CARDIOVERSION N/A 01/11/2014   Procedure: TRANSESOPHAGEAL ECHOCARDIOGRAM (TEE);  Surgeon: [name], MD;  Location: [facility];  Service: Cardiovascular;  Laterality: N/A;   [place]   Social History   Occupational History   Not on file  Tobacco Use   Smoking status: Former    Packs/day: 1.00    Years: 43.00     Pack years: 43.00    Types: Cigarettes    Quit date: 2020    Years since quitting: 2.9   Smokeless tobacco: Never  Vaping Use   Vaping Use: Never used  Substance and Sexual Activity   Alcohol use: Yes    Alcohol/week: 0.0 standard drinks    Comment: 10/27/2013 "might have 1 beer/month"   Drug use: No   Sexual activity: Yes

## 2021-08-14 MED ORDER — LIDOCAINE HCL 1 % IJ SOLN
2.0000 mL | INTRAMUSCULAR | Status: AC | PRN
Start: 2021-08-13 — End: 2021-08-13
  Administered 2021-08-13: 09:00:00 2 mL

## 2021-08-14 MED ORDER — BUPIVACAINE HCL 0.25 % IJ SOLN
2.0000 mL | INTRAMUSCULAR | Status: AC | PRN
Start: 2021-08-13 — End: 2021-08-13
  Administered 2021-08-13: 09:00:00 2 mL via INTRA_ARTICULAR

## 2021-08-14 MED ORDER — METHYLPREDNISOLONE ACETATE 40 MG/ML IJ SUSP
40.0000 mg | INTRAMUSCULAR | Status: AC | PRN
Start: 2021-08-13 — End: 2021-08-13
  Administered 2021-08-13: 09:00:00 40 mg via INTRA_ARTICULAR

## 2021-08-25 ENCOUNTER — Other Ambulatory Visit (HOSPITAL_BASED_OUTPATIENT_CLINIC_OR_DEPARTMENT_OTHER): Payer: Self-pay

## 2021-09-04 ENCOUNTER — Other Ambulatory Visit: Payer: Self-pay | Admitting: Interventional Cardiology

## 2021-09-04 ENCOUNTER — Other Ambulatory Visit (HOSPITAL_BASED_OUTPATIENT_CLINIC_OR_DEPARTMENT_OTHER): Payer: Self-pay

## 2021-09-04 MED ORDER — SOTALOL HCL 80 MG PO TABS
ORAL_TABLET | ORAL | 2 refills | Status: DC
Start: 2021-09-04 — End: 2022-06-04
  Filled 2021-09-04: qty 180, 90d supply, fill #0
  Filled 2021-12-04: qty 180, 90d supply, fill #1
  Filled 2022-03-02: qty 180, 90d supply, fill #2

## 2021-09-04 MED FILL — Fluticasone-Salmeterol Aer Powder BA 100-50 MCG/ACT: RESPIRATORY_TRACT | 30 days supply | Qty: 60 | Fill #1 | Status: AC

## 2021-09-05 ENCOUNTER — Other Ambulatory Visit (HOSPITAL_BASED_OUTPATIENT_CLINIC_OR_DEPARTMENT_OTHER): Payer: Self-pay

## 2021-09-08 ENCOUNTER — Other Ambulatory Visit (HOSPITAL_BASED_OUTPATIENT_CLINIC_OR_DEPARTMENT_OTHER): Payer: Self-pay

## 2021-09-16 ENCOUNTER — Other Ambulatory Visit (HOSPITAL_BASED_OUTPATIENT_CLINIC_OR_DEPARTMENT_OTHER): Payer: Self-pay

## 2021-09-18 ENCOUNTER — Other Ambulatory Visit (HOSPITAL_BASED_OUTPATIENT_CLINIC_OR_DEPARTMENT_OTHER): Payer: Self-pay

## 2021-09-22 ENCOUNTER — Other Ambulatory Visit: Payer: Self-pay | Admitting: Family Medicine

## 2021-09-22 ENCOUNTER — Other Ambulatory Visit (HOSPITAL_BASED_OUTPATIENT_CLINIC_OR_DEPARTMENT_OTHER): Payer: Self-pay

## 2021-09-23 ENCOUNTER — Other Ambulatory Visit (HOSPITAL_BASED_OUTPATIENT_CLINIC_OR_DEPARTMENT_OTHER): Payer: Self-pay

## 2021-09-23 ENCOUNTER — Other Ambulatory Visit: Payer: Self-pay | Admitting: Family Medicine

## 2021-09-23 MED ORDER — ALBUTEROL SULFATE HFA 108 (90 BASE) MCG/ACT IN AERS
INHALATION_SPRAY | RESPIRATORY_TRACT | 1 refills | Status: DC
  Filled 2021-09-23: qty 8.5, 17d supply, fill #0
  Filled 2021-10-20: qty 8.5, 17d supply, fill #1

## 2021-10-03 ENCOUNTER — Ambulatory Visit: Payer: BC Managed Care – PPO | Admitting: Podiatry

## 2021-10-04 ENCOUNTER — Other Ambulatory Visit: Payer: Self-pay | Admitting: Interventional Cardiology

## 2021-10-06 ENCOUNTER — Other Ambulatory Visit (HOSPITAL_BASED_OUTPATIENT_CLINIC_OR_DEPARTMENT_OTHER): Payer: Self-pay

## 2021-10-06 ENCOUNTER — Other Ambulatory Visit: Payer: Self-pay | Admitting: Interventional Cardiology

## 2021-10-06 MED ORDER — ICOSAPENT ETHYL 1 G PO CAPS
2.0000 g | ORAL_CAPSULE | Freq: Two times a day (BID) | ORAL | 1 refills | Status: DC
  Filled 2021-10-06 – 2021-10-08 (×2): qty 120, 30d supply, fill #0
  Filled 2021-11-10: qty 120, 30d supply, fill #1

## 2021-10-07 ENCOUNTER — Other Ambulatory Visit (HOSPITAL_BASED_OUTPATIENT_CLINIC_OR_DEPARTMENT_OTHER): Payer: Self-pay

## 2021-10-08 ENCOUNTER — Telehealth: Payer: Self-pay

## 2021-10-08 ENCOUNTER — Other Ambulatory Visit (HOSPITAL_BASED_OUTPATIENT_CLINIC_OR_DEPARTMENT_OTHER): Payer: Self-pay

## 2021-10-08 NOTE — Telephone Encounter (Signed)
**Note De-Identified Nekeya Briski Obfuscation** [name] (Key: [id number]) Rx #: [id number] Vascepa 1GM capsules  Form: [employer] Electronic Prior Authorization Form [phone] NCPDP) Prior authorization for Vascepa has been approved. PA Case: [id number], Status: Approved, Coverage Starts on: 10/08/2021 12:00 AM, Coverage Ends on: 10/08/2022 12:00 AM.   I have notified Caryl Pina [facility] at [facility] approval.

## 2021-10-08 NOTE — Telephone Encounter (Signed)
**Note De-Identified Eric Palmer Obfuscation** Vascepa PA started through covermymeds. Key: [id number]

## 2021-10-09 ENCOUNTER — Ambulatory Visit: Payer: 59 | Admitting: Podiatry

## 2021-10-09 ENCOUNTER — Other Ambulatory Visit: Payer: Self-pay

## 2021-10-09 ENCOUNTER — Other Ambulatory Visit (HOSPITAL_BASED_OUTPATIENT_CLINIC_OR_DEPARTMENT_OTHER): Payer: Self-pay

## 2021-10-09 DIAGNOSIS — M79671 Pain in right foot: Secondary | ICD-10-CM

## 2021-10-09 DIAGNOSIS — E1140 Type 2 diabetes mellitus with diabetic neuropathy, unspecified: Secondary | ICD-10-CM

## 2021-10-09 DIAGNOSIS — E1149 Type 2 diabetes mellitus with other diabetic neurological complication: Secondary | ICD-10-CM

## 2021-10-09 DIAGNOSIS — M79672 Pain in left foot: Secondary | ICD-10-CM

## 2021-10-09 MED ORDER — TRAMADOL HCL 50 MG PO TABS
ORAL_TABLET | ORAL | 2 refills | Status: DC
  Filled 2021-10-09: qty 90, 30d supply, fill #0

## 2021-10-09 MED ORDER — TRAMADOL HCL 50 MG PO TABS: 50.0000 mg | ORAL_TABLET | Freq: Three times a day (TID) | ORAL | 2 refills | Status: DC

## 2021-10-10 ENCOUNTER — Other Ambulatory Visit (HOSPITAL_BASED_OUTPATIENT_CLINIC_OR_DEPARTMENT_OTHER): Payer: Self-pay

## 2021-10-10 NOTE — Progress Notes (Signed)
Subjective:   Patient ID: Eric Palmer, male   DOB: 66 y.o.   MRN: [id number]   HPI Patient presents stating the [place] seem to make a little bit of difference but I cannot do it due to my insurance and I am doing a lot of swimming and exercising and my feet seem okay at this time   ROS      Objective:  Physical Exam  Neurovascular status has not changed with patient having long-term history of neuropathy secondary to diabetes that he is now keeping under better control.  Patient is doing a better job of exercising uses medication sparingly and seems to be on a good track     Assessment:  Overall I do think patient is doing as well as possible with a neuropathic condition that he has and is doing all the right things that possibly can be done     Plan:  H&P spent a great deal time going over with him diabetic neuropathy continued activities other ideas that he may do and that at 1 point in future he may have to look at spinal implant if symptoms were to progress.  At this point I am satisfied where things stand and patient is also and will be seen back as needed

## 2021-10-20 ENCOUNTER — Other Ambulatory Visit (HOSPITAL_BASED_OUTPATIENT_CLINIC_OR_DEPARTMENT_OTHER): Payer: Self-pay

## 2021-10-20 MED FILL — Fluticasone-Salmeterol Aer Powder BA 100-50 MCG/ACT: RESPIRATORY_TRACT | 30 days supply | Qty: 60 | Fill #2 | Status: AC

## 2021-10-26 ENCOUNTER — Encounter: Payer: Self-pay | Admitting: Podiatry

## 2021-10-27 ENCOUNTER — Telehealth: Payer: Self-pay | Admitting: *Deleted

## 2021-10-27 MED ORDER — NONFORMULARY OR COMPOUNDED ITEM: Status: DC

## 2021-10-27 NOTE — Telephone Encounter (Signed)
Faxed prescribed the  ?

## 2021-10-27 NOTE — Telephone Encounter (Signed)
Go ahead and refill this with [employer] Easy for you to do

## 2021-10-30 ENCOUNTER — Other Ambulatory Visit (HOSPITAL_BASED_OUTPATIENT_CLINIC_OR_DEPARTMENT_OTHER): Payer: Self-pay

## 2021-10-30 ENCOUNTER — Other Ambulatory Visit: Payer: Self-pay | Admitting: Interventional Cardiology

## 2021-10-30 MED ORDER — DILTIAZEM HCL ER COATED BEADS 360 MG PO CP24
ORAL_CAPSULE | ORAL | 0 refills | Status: DC
  Filled 2021-10-30: qty 90, 90d supply, fill #0

## 2021-10-31 ENCOUNTER — Other Ambulatory Visit (HOSPITAL_BASED_OUTPATIENT_CLINIC_OR_DEPARTMENT_OTHER): Payer: Self-pay

## 2021-11-10 ENCOUNTER — Other Ambulatory Visit (HOSPITAL_BASED_OUTPATIENT_CLINIC_OR_DEPARTMENT_OTHER): Payer: Self-pay

## 2021-11-11 NOTE — Progress Notes (Signed)
?Cardiology Office Note:   ? ?Date:  11/12/2021  ? ?ID:  Eric Palmer, DOB 1955/12/17, MRN [id number] ? ?PCP:  [name], MD  ?Cardiologist:  [name], MD  ? ?Referring MD: [name], MD  ? ?Chief Complaint  ?Patient presents with  ? Coronary Artery Disease  ? Atrial Fibrillation  ? Hypertension  ? Hyperlipidemia  ? ? ?History of Present Illness:   ? ?DEMAR Palmer is a 66 y.o. male with a hx of CAD s/p RCA stent 1996, PAF, DM, HTN, chronic anticoagulation, obesity, HLD, and OSA. ? ?Eric Palmer is doing well.  He has not had angina or any cardiac issues since being seen a year ago.  He does not recall any recent episodes of palpitations that would suggest atrial fibrillation.  No neurological complaints.  He denies angina.  No bleeding on anticoagulation therapy. ? ?He denies claudication. ? ?Past Medical History:  ?Diagnosis Date  ? Anal fissure   ? Chronic bronchitis ([place])   ? "get it q spring and fall" (10/27/2013)  ? Coronary artery disease   ? a. s/p PCI in 1996. b. LHC 01/2006 showed 50% mid LAD, otherwise widely patent cors with minimal irregularities. c. Nuclear stress test in 04/2009 was reportedly low risk, no ischemia. // d. Nuclear stress test 2/19: EF 55, transient ST depression in recovery, no ischemia, inf defect (artifact vs scar)   ? DM type 2, uncontrolled, with neuropathy   ? ED (erectile dysfunction)   ? Essential hypertension   ? Hx of echocardiogram   ? Echo (10/2013): Mild LVH, moderate focal basal hypertrophy of the septum, EF 50-55%, no RWMA  ? Hyperlipidemia   ? Internal hemorrhoids without mention of complication   ? Obesity   ? Obesity   ? OSA on CPAP   ? Paresthesia of both legs   ? Paroxysmal atrial fibrillation ([name])   ? a. failed Tikosyn, Multaq. b. s/p ablation of AF/AFL in 12/2013.  ? Paroxysmal atrial flutter ([place])   ? a. s/p ablation of AF/AFL in 12/2013.  ? Smoker   ? QUIT 10/13/13  ? Tubular adenoma of colon 2016  ? ? ?Past Surgical History:  ?Procedure  Laterality Date  ? ABLATION  01-12-2014  ? PVI and CTI by Dr [name]  ? ANAL FISSURE REPAIR  ~ 2010  ? ATRIAL FIBRILLATION ABLATION N/A 01/12/2014  ? Procedure: ATRIAL FIBRILLATION ABLATION;  Surgeon: Coralyn Mark, MD;  Location: [facility];  Service: Cardiovascular;  Laterality: N/A;  ? CARDIAC CATHETERIZATION  2007  ? CORONARY ANGIOPLASTY WITH STENT PLACEMENT  1996  ? residual 50% LAD and RCA by cath in 2007  ? CORONARY ANGIOPLASTY WITH STENT PLACEMENT  1995  ? INGUINAL HERNIA REPAIR Right 1963  ? LEFT HEART CATH AND CORONARY ANGIOGRAPHY N/A 10/11/2017  ? Procedure: LEFT HEART CATH AND CORONARY ANGIOGRAPHY;  Surgeon: [name], MD;  Location: [facility];  Service: Cardiovascular;  Laterality: N/A;  ? TEE WITHOUT CARDIOVERSION N/A 01/11/2014  ? Procedure: TRANSESOPHAGEAL ECHOCARDIOGRAM (TEE);  Surgeon: [name], MD;  Location: [facility];  Service: Cardiovascular;  Laterality: N/A;  ? Eric Palmer  ? ? ?Current Medications: ?Current Meds  ?Medication Sig  ? albuterol (VENTOLIN HFA) 108 (90 Base) MCG/ACT inhaler Use 2 puffs every 4-6 hours as needed for cough/wheeze  ? atorvastatin (LIPITOR) 40 MG tablet Take 1 tablet by mouth daily at 6 pm  ? diltiazem (CARDIZEM CD) 360  MG 24 hr capsule TAKE 1 CAPSULE(360 MG) BY MOUTH DAILY  ? empagliflozin (JARDIANCE) 25 MG TABS tablet Take 1 tablet (25 mg total) by mouth daily.  ? fluticasone-salmeterol (ADVAIR DISKUS) 100-50 MCG/ACT AEPB Inhale 1 puff into the lungs twice daily  ? icosapent Ethyl (VASCEPA) 1 g capsule Take 2 capsules (2 g total) by mouth 2 (two) times daily.  ? metFORMIN (GLUCOPHAGE-XR) 750 MG 24 hr tablet Take 1 tablet (750 mg) by mouth twice daily.  ? nitroGLYCERIN (NITROSTAT) 0.4 MG SL tablet DISSOLVE 1 TABLET UNDER THE TONGUE EVERY 5 MINUTES FOR 3 DOSES AS NEEDED FOR CHEST PAIN(CALL 911 IF 3RD DOSE IS TAKEN)  ? NONFORMULARY OR COMPOUNDED ITEM Pain cream : ketamine 5%, baclofen 2%, gabapentin 5%, lidocaine 5%,  menthol 1% ?Order faxed to [facility]  ? rivaroxaban (XARELTO) 20 MG TABS tablet TAKE 1 TABLET(20 MG) BY MOUTH DAILY WITH SUPPER  ? sotalol (BETAPACE) 80 MG tablet TAKE 1 TABLET(80 MG) BY MOUTH TWICE DAILY  ? tadalafil (CIALIS) 20 MG tablet Take 1 tablet (20 mg total) by mouth daily as needed for erectile dysfunction.  ? traMADol (ULTRAM) 50 MG tablet Take 1 tablet (50 mg total) by mouth 3 (three) times daily.  ?  ? ?Allergies:   Cephalosporins, Penicillins, Thallium, Codeine, Gabapentin, and Eliquis [apixaban]  ? ?Social History  ? ?Socioeconomic History  ? Marital status: Married  ?  Spouse name: Not on file  ? Number of children: Not on file  ? Years of education: Not on file  ? Highest education level: Not on file  ?Occupational History  ? Not on file  ?Tobacco Use  ? Smoking status: Former  ?  Packs/day: 1.00  ?  Years: 43.00  ?  Pack years: 43.00  ?  Types: Cigarettes  ?  Quit date: 2020  ?  Years since quitting: 3.2  ? Smokeless tobacco: Never  ?Vaping Use  ? Vaping Use: Never used  ?Substance and Sexual Activity  ? Alcohol use: Yes  ?  Alcohol/week: 0.0 standard drinks  ?  Comment: 10/27/2013 "might have 1 beer/month"  ? Drug use: No  ? Sexual activity: Yes  ?Other Topics Concern  ? Not on file  ?Social History Narrative  ? Works as an Programme researcher, broadcasting/film/video for [employer]  ? ?Social Determinants of Health  ? ?Financial Resource Strain: Not on file  ?Food Insecurity: Not on file  ?Transportation Needs: Not on file  ?Physical Activity: Not on file  ?Stress: Not on file  ?Social Connections: Not on file  ?  ? ?Family History: ?The patient's family history includes Heart attack in his father; Heart disease in an other family member; Heart disease (age of onset: 44) in his father; Heart disease (age of onset: 23) in his mother; Hyperlipidemia in an other family member; Hypertension in his mother. There is no history of Stroke, Colon cancer, Esophageal cancer, Liver disease, Pancreatic cancer, or Stomach cancer. ? ?ROS:    ?Please see the history of present illness.    ?He is troubled by neuropathy.  Causes discomfort in his feet.  He has varicose veins on his legs.  All other systems reviewed and are negative. ? ?EKGs/Labs/Other Studies Reviewed:   ? ?The following studies were reviewed today: ? ?AAA SCAN 09/25/2020: ?Summary:  ?Abdominal Aorta: No evidence of an abdominal aortic aneurysm was  ?visualized. The largest aortic measurement is 2.3 cm. No previous exam  ?available for comparison.  ?IVC/Iliac: There is no evidence of thrombus involving the IVC.  ? ?  EKG:  EKG normal sinus rhythm, normal EKG appearance. ? ?Recent Labs: ?06/04/2021: ALT 30; BUN 12; Creatinine, Ser 1.06; Hemoglobin 15.9; Platelets 238.0; Potassium 4.4; Sodium 138  ?Recent Lipid Panel ?   ?Component Value Date/Time  ? CHOL 109 06/04/2021 0742  ? CHOL 130 06/29/2019 0724  ? TRIG 262.0 (H) 06/04/2021 0742  ? HDL 36.40 (L) 06/04/2021 0742  ? HDL 35 (L) 06/29/2019 0724  ? CHOLHDL 3 06/04/2021 0742  ? VLDL 52.4 (H) 06/04/2021 0742  ? [place]  07/01/2020 1411  ?   Comment:  ?   . ?LDL cholesterol not calculated. Triglyceride levels ?greater than 400 mg/dL invalidate calculated LDL results. ?. ?Reference range: <100 ?[name] ?Desirable range <100 mg/dL for primary prevention;   ?<70 mg/dL for patients with CHD or diabetic patients  ?with > or = 2 CHD risk factors. ?. ?LDL-C is now calculated using the Martin-Hopkins  ?calculation, which is a validated novel method providing  ?better accuracy than the Friedewald equation in the  ?estimation of LDL-C.  ?Cresenciano Genre et al. Annamaria Helling. 7591;638(46): 2061-2068  ?(http://education.Quest[employer].com/faq/FAQ164) ?  ? LDLDIRECT 46.0 06/04/2021 0742  ? ? ?Physical Exam:   ? ?VS:  BP 138/78   Pulse 73   Ht '6\' 1"'$  (1.854 m)   Wt 238 lb 3.2 oz (108 kg)   SpO2 97%   BMI 31.43 kg/m?    ? ?Wt Readings from Last 3 Encounters:  ?11/12/21 238 lb 3.2 oz (108 kg)  ?07/04/21 239 lb 6.4 oz (108.6 kg)  ?06/04/21 241 lb 4.8 oz (109.5 kg)  ?  ? ?GEN:  Overweight but decreasing.. No acute distress ?HEENT: Normal ?NECK: No JVD. ?LYMPHATICS: No lymphadenopathy ?CARDIAC: No murmur. RRR no gallop, or edema. ?VASCULAR:  Normal Pulses. No bruits. ?RESPIRATORY:  Clear

## 2021-11-12 ENCOUNTER — Other Ambulatory Visit (HOSPITAL_BASED_OUTPATIENT_CLINIC_OR_DEPARTMENT_OTHER): Payer: Self-pay

## 2021-11-12 ENCOUNTER — Ambulatory Visit: Payer: 59 | Admitting: Interventional Cardiology

## 2021-11-12 ENCOUNTER — Encounter: Payer: Self-pay | Admitting: Interventional Cardiology

## 2021-11-12 ENCOUNTER — Other Ambulatory Visit: Payer: Self-pay

## 2021-11-12 VITALS — BP 138/78 | HR 73 | Ht 73.0 in | Wt 238.2 lb

## 2021-11-12 DIAGNOSIS — G4733 Obstructive sleep apnea (adult) (pediatric): Secondary | ICD-10-CM | POA: Diagnosis not present

## 2021-11-12 DIAGNOSIS — Z8249 Family history of ischemic heart disease and other diseases of the circulatory system: Secondary | ICD-10-CM

## 2021-11-12 DIAGNOSIS — I4892 Unspecified atrial flutter: Secondary | ICD-10-CM

## 2021-11-12 DIAGNOSIS — I25119 Atherosclerotic heart disease of native coronary artery with unspecified angina pectoris: Secondary | ICD-10-CM

## 2021-11-12 DIAGNOSIS — E1142 Type 2 diabetes mellitus with diabetic polyneuropathy: Secondary | ICD-10-CM

## 2021-11-12 DIAGNOSIS — Z7901 Long term (current) use of anticoagulants: Secondary | ICD-10-CM

## 2021-11-12 DIAGNOSIS — I10 Essential (primary) hypertension: Secondary | ICD-10-CM

## 2021-11-12 DIAGNOSIS — E7849 Other hyperlipidemia: Secondary | ICD-10-CM

## 2021-11-12 MED ORDER — TADALAFIL 20 MG PO TABS
20.0000 mg | ORAL_TABLET | Freq: Every day | ORAL | 3 refills | Status: DC | PRN
  Filled 2021-11-12: qty 10, 30d supply, fill #0
  Filled 2022-10-24: qty 10, 30d supply, fill #1

## 2021-11-12 NOTE — Patient Instructions (Signed)
Medication Instructions:  ?1) You may take Cialis '20mg'$  once daily as needed ? ?*If you need a refill on your cardiac medications before your next appointment, please call your pharmacy* ? ? ?Lab Work: ?None ?If you have labs (blood work) drawn today and your tests are completely normal, you will receive your results only by: ?MyChart Message (if you have MyChart) OR ?A paper copy in the mail ?If you have any lab test that is abnormal or we need to change your treatment, we will call you to review the results. ? ? ?Testing/Procedures: ?None ? ? ?Follow-Up: ?At [facility], you and your health needs are our priority.  As part of our continuing mission to provide you with exceptional heart care, we have created designated Provider Care Teams.  These Care Teams include your primary Cardiologist (physician) and Advanced Practice Providers (APPs -  Physician Assistants and Nurse Practitioners) who all work together to provide you with the care you need, when you need it. ? ?We recommend signing up for the patient portal called "MyChart".  Sign up information is provided on this After Visit Summary.  MyChart is used to connect with patients for Virtual Visits (Telemedicine).  Patients are able to view lab/test results, encounter notes, upcoming appointments, etc.  Non-urgent messages can be sent to your provider as well.   ?To learn more about what you can do with MyChart, go to [email or web address].   ? ?Your next appointment:   ?1 year(s) ? ?The format for your next appointment:   ?In Person ? ?Provider:   ?[name], MD  ? ? ?Other Instructions ?  ?

## 2021-11-17 ENCOUNTER — Other Ambulatory Visit (HOSPITAL_BASED_OUTPATIENT_CLINIC_OR_DEPARTMENT_OTHER): Payer: Self-pay

## 2021-11-27 ENCOUNTER — Other Ambulatory Visit (HOSPITAL_BASED_OUTPATIENT_CLINIC_OR_DEPARTMENT_OTHER): Payer: Self-pay

## 2021-12-01 ENCOUNTER — Other Ambulatory Visit (HOSPITAL_BASED_OUTPATIENT_CLINIC_OR_DEPARTMENT_OTHER): Payer: Self-pay

## 2021-12-04 ENCOUNTER — Other Ambulatory Visit (HOSPITAL_BASED_OUTPATIENT_CLINIC_OR_DEPARTMENT_OTHER): Payer: Self-pay

## 2021-12-04 ENCOUNTER — Encounter (HOSPITAL_BASED_OUTPATIENT_CLINIC_OR_DEPARTMENT_OTHER): Payer: Self-pay

## 2021-12-05 ENCOUNTER — Other Ambulatory Visit (HOSPITAL_BASED_OUTPATIENT_CLINIC_OR_DEPARTMENT_OTHER): Payer: Self-pay

## 2021-12-09 ENCOUNTER — Other Ambulatory Visit: Payer: Self-pay | Admitting: Interventional Cardiology

## 2021-12-09 ENCOUNTER — Other Ambulatory Visit (HOSPITAL_BASED_OUTPATIENT_CLINIC_OR_DEPARTMENT_OTHER): Payer: Self-pay

## 2021-12-09 DIAGNOSIS — I480 Paroxysmal atrial fibrillation: Secondary | ICD-10-CM

## 2021-12-10 ENCOUNTER — Other Ambulatory Visit (HOSPITAL_BASED_OUTPATIENT_CLINIC_OR_DEPARTMENT_OTHER): Payer: Self-pay

## 2021-12-10 MED ORDER — RIVAROXABAN 20 MG PO TABS
ORAL_TABLET | ORAL | 1 refills | Status: DC
  Filled 2021-12-10: qty 10, 10d supply, fill #0
  Filled 2021-12-22: qty 30, 30d supply, fill #1
  Filled 2022-01-17: qty 30, 30d supply, fill #2
  Filled 2022-02-15: qty 30, 30d supply, fill #3
  Filled 2022-03-15: qty 30, 30d supply, fill #4
  Filled 2022-04-11: qty 30, 30d supply, fill #5
  Filled 2022-05-22: qty 20, 20d supply, fill #6

## 2021-12-10 MED ORDER — ICOSAPENT ETHYL 1 G PO CAPS
2.0000 g | ORAL_CAPSULE | Freq: Two times a day (BID) | ORAL | 3 refills | Status: DC
  Filled 2021-12-10: qty 40, 10d supply, fill #0
  Filled 2021-12-22: qty 120, 30d supply, fill #1
  Filled 2022-01-17: qty 120, 30d supply, fill #2
  Filled 2022-02-17: qty 120, 30d supply, fill #3
  Filled 2022-03-15: qty 120, 30d supply, fill #4
  Filled 2022-04-17: qty 120, 30d supply, fill #5
  Filled 2022-05-22: qty 120, 30d supply, fill #6
  Filled 2022-06-17: qty 120, 30d supply, fill #7
  Filled 2022-07-20: qty 120, 30d supply, fill #8
  Filled 2022-08-22 (×2): qty 120, 30d supply, fill #9
  Filled 2022-09-16: qty 120, 30d supply, fill #10
  Filled 2022-10-13: qty 120, 30d supply, fill #11

## 2021-12-10 NOTE — Telephone Encounter (Signed)
Prescription refill request for Xarelto received.  ?Indication: Atrial Fib ?Last office visit: 11/12/21 Linard Millers MD ?Weight: 108kg ?Age: 66 ?Scr: 1.06 on 06/04/21 ?CrCl: 107.55 ? ?Based on above findings Xarelto '20mg'$  daily is the appropriate dose.  Refill approved. ? ?

## 2021-12-18 ENCOUNTER — Other Ambulatory Visit (HOSPITAL_BASED_OUTPATIENT_CLINIC_OR_DEPARTMENT_OTHER): Payer: Self-pay

## 2021-12-18 MED FILL — Fluticasone-Salmeterol Aer Powder BA 100-50 MCG/ACT: RESPIRATORY_TRACT | 30 days supply | Qty: 60 | Fill #3 | Status: AC

## 2021-12-22 ENCOUNTER — Other Ambulatory Visit: Payer: Self-pay | Admitting: Family Medicine

## 2021-12-22 ENCOUNTER — Other Ambulatory Visit: Payer: Self-pay | Admitting: Podiatry

## 2021-12-22 ENCOUNTER — Other Ambulatory Visit (HOSPITAL_BASED_OUTPATIENT_CLINIC_OR_DEPARTMENT_OTHER): Payer: Self-pay

## 2021-12-22 DIAGNOSIS — E1142 Type 2 diabetes mellitus with diabetic polyneuropathy: Secondary | ICD-10-CM

## 2021-12-22 MED ORDER — EMPAGLIFLOZIN 25 MG PO TABS
25.0000 mg | ORAL_TABLET | Freq: Every day | ORAL | 1 refills | Status: DC
  Filled 2021-12-22: qty 30, 30d supply, fill #0
  Filled 2022-01-17: qty 30, 30d supply, fill #1
  Filled 2022-02-15: qty 30, 30d supply, fill #2
  Filled 2022-03-15: qty 30, 30d supply, fill #3
  Filled 2022-04-11: qty 30, 30d supply, fill #4
  Filled 2022-05-15: qty 30, 30d supply, fill #5

## 2021-12-23 ENCOUNTER — Other Ambulatory Visit (HOSPITAL_BASED_OUTPATIENT_CLINIC_OR_DEPARTMENT_OTHER): Payer: Self-pay

## 2021-12-24 ENCOUNTER — Other Ambulatory Visit (HOSPITAL_BASED_OUTPATIENT_CLINIC_OR_DEPARTMENT_OTHER): Payer: Self-pay

## 2021-12-25 ENCOUNTER — Other Ambulatory Visit: Payer: Self-pay | Admitting: Podiatry

## 2021-12-25 ENCOUNTER — Other Ambulatory Visit (HOSPITAL_BASED_OUTPATIENT_CLINIC_OR_DEPARTMENT_OTHER): Payer: Self-pay

## 2021-12-25 ENCOUNTER — Telehealth: Payer: Self-pay | Admitting: *Deleted

## 2021-12-25 MED ORDER — TRAMADOL HCL 50 MG PO TABS
50.0000 mg | ORAL_TABLET | Freq: Four times a day (QID) | ORAL | 0 refills | Status: AC
  Filled 2021-12-25: qty 28, 7d supply, fill #0

## 2021-12-25 NOTE — Telephone Encounter (Signed)
Patient is requesting a refill of his Tramadol-50 mg,please advise. ?

## 2021-12-25 NOTE — Telephone Encounter (Signed)
Patient notified

## 2021-12-25 NOTE — Telephone Encounter (Signed)
done

## 2021-12-26 ENCOUNTER — Other Ambulatory Visit (HOSPITAL_BASED_OUTPATIENT_CLINIC_OR_DEPARTMENT_OTHER): Payer: Self-pay

## 2021-12-29 ENCOUNTER — Other Ambulatory Visit (HOSPITAL_BASED_OUTPATIENT_CLINIC_OR_DEPARTMENT_OTHER): Payer: Self-pay

## 2021-12-31 ENCOUNTER — Encounter: Payer: Self-pay | Admitting: Family Medicine

## 2021-12-31 ENCOUNTER — Ambulatory Visit (INDEPENDENT_AMBULATORY_CARE_PROVIDER_SITE_OTHER): Payer: Medicare Other | Admitting: Family Medicine

## 2021-12-31 VITALS — BP 126/72 | HR 65 | Temp 97.5°F | Ht 73.62 in | Wt 235.5 lb

## 2021-12-31 DIAGNOSIS — Z0000 Encounter for general adult medical examination without abnormal findings: Secondary | ICD-10-CM

## 2021-12-31 DIAGNOSIS — Z1211 Encounter for screening for malignant neoplasm of colon: Secondary | ICD-10-CM | POA: Diagnosis not present

## 2021-12-31 LAB — CBC WITH DIFFERENTIAL/PLATELET
Basophils Absolute: 0.1 10*3/uL (ref 0.0–0.1)
Basophils Relative: 0.9 % (ref 0.0–3.0)
Eosinophils Absolute: 0.4 10*3/uL (ref 0.0–0.7)
Eosinophils Relative: 5.9 % — ABNORMAL HIGH (ref 0.0–5.0)
HCT: 48.2 % (ref 39.0–52.0)
Hemoglobin: 16.2 g/dL (ref 13.0–17.0)
Lymphocytes Relative: 24.6 % (ref 12.0–46.0)
Lymphs Abs: 1.8 10*3/uL (ref 0.7–4.0)
MCHC: 33.6 g/dL (ref 30.0–36.0)
MCV: 90.7 fl (ref 78.0–100.0)
Monocytes Absolute: 0.7 10*3/uL (ref 0.1–1.0)
Monocytes Relative: 9.6 % (ref 3.0–12.0)
Neutro Abs: 4.4 10*3/uL (ref 1.4–7.7)
Neutrophils Relative %: 59 % (ref 43.0–77.0)
Platelets: 244 10*3/uL (ref 150.0–400.0)
RBC: 5.31 Mil/uL (ref 4.22–5.81)
RDW: 12.8 % (ref 11.5–15.5)
WBC: 7.5 10*3/uL (ref 4.0–10.5)

## 2021-12-31 LAB — LIPID PANEL
Cholesterol: 112 mg/dL (ref 0–200)
HDL: 37.1 mg/dL — ABNORMAL LOW (ref >39.00)
NonHDL: 75.19
Total CHOL/HDL Ratio: 3
Triglycerides: 235 mg/dL — ABNORMAL HIGH (ref 0.0–149.0)
VLDL: 47 mg/dL — ABNORMAL HIGH (ref 0.0–40.0)

## 2021-12-31 LAB — BASIC METABOLIC PANEL
BUN: 13 mg/dL (ref 6–23)
CO2: 28 mEq/L (ref 19–32)
Calcium: 9.1 mg/dL (ref 8.4–10.5)
Chloride: 102 mEq/L (ref 96–112)
Creatinine, Ser: 0.96 mg/dL (ref 0.40–1.50)
GFR: 83.25 mL/min (ref >60.00)
Glucose, Bld: 122 mg/dL — ABNORMAL HIGH (ref 70–99)
Potassium: 4.2 mEq/L (ref 3.5–5.1)
Sodium: 139 mEq/L (ref 135–145)

## 2021-12-31 LAB — MICROALBUMIN / CREATININE URINE RATIO
Creatinine,U: 98.3 mg/dL
Microalb Creat Ratio: 3.6 mg/g (ref 0.0–30.0)
Microalb, Ur: 3.5 mg/dL — ABNORMAL HIGH (ref 0.0–1.9)

## 2021-12-31 LAB — HEPATIC FUNCTION PANEL
ALT: 26 U/L (ref 0–53)
AST: 18 U/L (ref 0–37)
Albumin: 4.6 g/dL (ref 3.5–5.2)
Alkaline Phosphatase: 71 U/L (ref 39–117)
Bilirubin, Direct: 0.1 mg/dL (ref 0.0–0.3)
Total Bilirubin: 0.7 mg/dL (ref 0.2–1.2)
Total Protein: 7.2 g/dL (ref 6.0–8.3)

## 2021-12-31 LAB — HEMOGLOBIN A1C: Hgb A1c MFr Bld: 7.3 % — ABNORMAL HIGH (ref 4.6–6.5)

## 2021-12-31 LAB — PSA, MEDICARE: PSA: 0.59 ng/ml (ref 0.10–4.00)

## 2021-12-31 LAB — LDL CHOLESTEROL, DIRECT: Direct LDL: 50 mg/dL

## 2021-12-31 LAB — TSH: TSH: 1.4 u[IU]/mL (ref 0.35–5.50)

## 2021-12-31 NOTE — Patient Instructions (Signed)
Consider Prevnar 20 vaccine after turning 66 ? ?Consider Shingrix. ? ?I will contact GI regarding colonoscopy.   ?

## 2021-12-31 NOTE — Progress Notes (Signed)
? ?Established Patient Office Visit ? ?Subjective   ?Patient ID: Eric Palmer, male    DOB: 07-18-1956  Age: 66 y.o. MRN: [id number] ? ?Chief Complaint  ?Patient presents with  ? Medicare Wellness  ? ? ?HPI ? ? ?Eric Palmer is here for physical exam.  Just recently went on Medicare.  Turns 66 in a few days.  His medical history is significant for hypertension, history of CAD, atrial fibrillation, obstructive sleep apnea, type 2 diabetes with diabetic neuropathy, hyperlipidemia.  Longstanding history of smoking but quit a couple years ago.  He is followed regularly by cardiology.  Also sees dermatology. ? ?One of his biggest complaints is that he has had ongoing issues with peripheral neuropathy pains.  He is followed by podiatry.  Had side effects with Lyrica with chest tightness.  Currently treated with tramadol which seems to help.  He states his mother had similar problems with neuropathy. ? ?Health maintenance reviewed ? ?-Prior hepatitis C screen negative ?-Eye exam up-to-date ?-Tetanus due this year ?-Colonoscopy due now ?-Had previous Zostavax but no Shingrix.  He declines Shingrix at this time.  Will be due for Prevnar 20 at age 46 ? ?Family history-father apparently died of congestive heart failure complications age 55.  His mother also had coronary disease but was in her 20s when she passed away.  He also has a brother with history of heart disease.  His mother had type 2 diabetes and hypertension. ? ?Social history-married for 37 years.  He has 2 sons.  No grandchildren.  30+ pack year history of smoking but quit 2 years ago.  No regular alcohol use.  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 ?Past Medical History:  ?Diagnosis Date  ? Anal fissure   ? Chronic bronchitis ([place])   ? "get it q spring and fall" (10/27/2013)  ? Coronary artery disease   ? a. s/p PCI in 1996. b. LHC 01/2006 showed 50% mid LAD, otherwise widely patent cors with minimal irregularities. c. Nuclear stress test  in 04/2009 was reportedly low risk, no ischemia. // d. Nuclear stress test 2/19: EF 55, transient ST depression in recovery, no ischemia, inf defect (artifact vs scar)   ? DM type 2, uncontrolled, with neuropathy   ? ED (erectile dysfunction)   ? Essential hypertension   ? Hx of echocardiogram   ? Echo (10/2013): Mild LVH, moderate focal basal hypertrophy of the septum, EF 50-55%, no RWMA  ? Hyperlipidemia   ? Internal hemorrhoids without mention of complication   ? Obesity   ? Obesity   ? OSA on CPAP   ? Paresthesia of both legs   ? Paroxysmal atrial fibrillation ([name])   ? a. failed Tikosyn, Multaq. b. s/p ablation of AF/AFL in 12/2013.  ? Paroxysmal atrial flutter ([place])   ? a. s/p ablation of AF/AFL in 12/2013.  ? Smoker   ? QUIT 10/13/13  ? Tubular adenoma of colon 2016  ? ?Past Surgical History:  ?Procedure Laterality Date  ? ABLATION  01-12-2014  ? PVI and CTI by Dr [name]  ? ANAL FISSURE REPAIR  ~ 2010  ? ATRIAL FIBRILLATION ABLATION N/A 01/12/2014  ? Procedure: ATRIAL FIBRILLATION ABLATION;  Surgeon: Coralyn Mark, MD;  Location: [facility];  Service: Cardiovascular;  Laterality: N/A;  ? CARDIAC CATHETERIZATION  2007  ? CORONARY ANGIOPLASTY WITH STENT PLACEMENT  1996  ? residual 50% LAD and RCA by cath in 2007  ? CORONARY ANGIOPLASTY WITH STENT  PLACEMENT  1995  ? INGUINAL HERNIA REPAIR Right 1963  ? LEFT HEART CATH AND CORONARY ANGIOGRAPHY N/A 10/11/2017  ? Procedure: LEFT HEART CATH AND CORONARY ANGIOGRAPHY;  Surgeon: [name], MD;  Location: [facility];  Service: Cardiovascular;  Laterality: N/A;  ? TEE WITHOUT CARDIOVERSION N/A 01/11/2014  ? Procedure: TRANSESOPHAGEAL ECHOCARDIOGRAM (TEE);  Surgeon: [name], MD;  Location: [facility];  Service: Cardiovascular;  Laterality: N/A;  ? [place]  ? ? reports that he quit smoking about 3 years ago. His smoking use included cigarettes. He has a 43.00 pack-year smoking history. He has never used smokeless tobacco. He  reports current alcohol use. He reports that he does not use drugs. ?family history includes Diabetes in his mother; Heart attack in his father; Heart disease in his brother and another family member; Heart disease (age of onset: 53) in his father; Heart disease (age of onset: 38) in his mother; Hyperlipidemia in an other family member; Hypertension in his mother. ?Allergies  ?Allergen Reactions  ? Cephalosporins Anaphylaxis  ? Penicillins Anaphylaxis and Other (See Comments)  ?  Has patient had a PCN reaction causing immediate rash, facial/tongue/throat swelling, SOB or lightheadedness with hypotension: Yes ?Has patient had a PCN reaction causing severe rash involving mucus membranes or skin necrosis: No ?Has patient had a PCN reaction that required hospitalization: No - went to doctors office ?Has patient had a PCN reaction occurring within the last 10 years: No ?If all of the above answers are "NO", then may proceed with Cephalosporin use. ?  ? Thallium Itching and Rash  ? Codeine Itching  ? Gabapentin Other (See Comments)  ?  "made me feel drugged"  ? Eliquis [Apixaban] Diarrhea  ? ? ?Review of Systems  ?Constitutional:  Negative for chills, fever, malaise/fatigue and weight loss.  ?HENT:  Negative for hearing loss.   ?Eyes:  Negative for blurred vision and double vision.  ?Respiratory:  Negative for cough and shortness of breath.   ?Cardiovascular:  Negative for chest pain, palpitations and leg swelling.  ?Gastrointestinal:  Negative for abdominal pain, blood in stool, constipation and diarrhea.  ?Genitourinary:  Negative for dysuria.  ?Skin:  Negative for rash.  ?Neurological:  Negative for dizziness, speech change, seizures, loss of consciousness and headaches.  ?Psychiatric/Behavioral:  Negative for depression.   ? ?  ?Objective:  ?  ? ?BP 126/72 (BP Location: Left Arm, Patient Position: Sitting, Cuff Size: Normal)   Pulse 65   Temp (!) 97.5 ?F (36.4 ?C) (Oral)   Ht 6' 1.62" (1.87 m)   Wt 235 lb 8 oz  (106.8 kg)   SpO2 97%   BMI 30.55 kg/m?  ?Wt Readings from Last 3 Encounters:  ?12/31/21 235 lb 8 oz (106.8 kg)  ?11/12/21 238 lb 3.2 oz (108 kg)  ?07/04/21 239 lb 6.4 oz (108.6 kg)  ? ?  ? ?Physical Exam ?Constitutional:   ?   General: He is not in acute distress. ?   Appearance: He is well-developed.  ?HENT:  ?   Head: Normocephalic and atraumatic.  ?   Right Ear: External ear normal.  ?   Left Ear: External ear normal.  ?Eyes:  ?   Conjunctiva/sclera: Conjunctivae normal.  ?   Pupils: Pupils are equal, round, and reactive to light.  ?Neck:  ?   Thyroid: No thyromegaly.  ?Cardiovascular:  ?   Rate and Rhythm: Normal rate and regular rhythm.  ?   Heart sounds: Normal  heart sounds. No murmur heard. ?Pulmonary:  ?   Effort: No respiratory distress.  ?   Breath sounds: No wheezing or rales.  ?Abdominal:  ?   General: Bowel sounds are normal. There is no distension.  ?   Palpations: Abdomen is soft. There is no mass.  ?   Tenderness: There is no abdominal tenderness. There is no guarding or rebound.  ?Musculoskeletal:  ?   Cervical back: Normal range of motion and neck supple.  ?   Right lower leg: No edema.  ?   Left lower leg: No edema.  ?   Comments: He has significant varicosities especially left lower extremity  ?Lymphadenopathy:  ?   Cervical: No cervical adenopathy.  ?Skin: ?   Findings: No rash.  ?   Comments: No foot lesions.  He does have some impairment with monofilament great toe volar surface otherwise monofilament intact.  ?Neurological:  ?   Mental Status: He is alert and oriented to person, place, and time.  ?   Cranial Nerves: No cranial nerve deficit.  ?Psychiatric:     ?   Mood and Affect: Mood normal.  ? ? ? ?No results found for any visits on 12/31/21. ? ?Last CBC ?Lab Results  ?Component Value Date  ? WBC 6.6 06/04/2021  ? HGB 15.9 06/04/2021  ? HCT 47.1 06/04/2021  ? MCV 89.3 06/04/2021  ? MCH 30.6 07/01/2020  ? RDW 13.6 06/04/2021  ? PLT 238.0 06/04/2021  ? ?Last metabolic panel ?Lab Results   ?Component Value Date  ? GLUCOSE 149 (H) 06/04/2021  ? NA 138 06/04/2021  ? K 4.4 06/04/2021  ? CL 99 06/04/2021  ? CO2 30 06/04/2021  ? BUN 12 06/04/2021  ? CREATININE 1.06 06/04/2021  ? GFRNONAA 82 06/29/2019

## 2022-01-02 ENCOUNTER — Other Ambulatory Visit: Payer: Self-pay

## 2022-01-02 ENCOUNTER — Other Ambulatory Visit (HOSPITAL_BASED_OUTPATIENT_CLINIC_OR_DEPARTMENT_OTHER): Payer: Self-pay

## 2022-01-05 ENCOUNTER — Encounter (HOSPITAL_BASED_OUTPATIENT_CLINIC_OR_DEPARTMENT_OTHER): Payer: Self-pay

## 2022-01-05 ENCOUNTER — Other Ambulatory Visit (HOSPITAL_BASED_OUTPATIENT_CLINIC_OR_DEPARTMENT_OTHER): Payer: Self-pay

## 2022-01-05 MED ORDER — ATORVASTATIN CALCIUM 40 MG PO TABS
ORAL_TABLET | ORAL | 3 refills | Status: DC
  Filled 2022-01-05: qty 90, 90d supply, fill #0
  Filled 2022-04-29: qty 90, 90d supply, fill #1
  Filled 2022-07-31: qty 90, 90d supply, fill #2
  Filled 2022-10-24: qty 90, 90d supply, fill #3

## 2022-01-06 ENCOUNTER — Other Ambulatory Visit (HOSPITAL_BASED_OUTPATIENT_CLINIC_OR_DEPARTMENT_OTHER): Payer: Self-pay

## 2022-01-08 ENCOUNTER — Other Ambulatory Visit (HOSPITAL_BASED_OUTPATIENT_CLINIC_OR_DEPARTMENT_OTHER): Payer: Self-pay

## 2022-01-13 ENCOUNTER — Other Ambulatory Visit: Payer: Self-pay | Admitting: Podiatry

## 2022-01-13 ENCOUNTER — Other Ambulatory Visit (HOSPITAL_BASED_OUTPATIENT_CLINIC_OR_DEPARTMENT_OTHER): Payer: Self-pay

## 2022-01-13 NOTE — Telephone Encounter (Signed)
Please advise 

## 2022-01-14 NOTE — Telephone Encounter (Signed)
Would like to hold off at this time

## 2022-01-15 ENCOUNTER — Encounter (HOSPITAL_BASED_OUTPATIENT_CLINIC_OR_DEPARTMENT_OTHER): Payer: Self-pay

## 2022-01-15 ENCOUNTER — Other Ambulatory Visit (HOSPITAL_BASED_OUTPATIENT_CLINIC_OR_DEPARTMENT_OTHER): Payer: Self-pay

## 2022-01-15 NOTE — Telephone Encounter (Signed)
He should get thru his family doc at this point

## 2022-01-20 ENCOUNTER — Encounter (HOSPITAL_BASED_OUTPATIENT_CLINIC_OR_DEPARTMENT_OTHER): Payer: Self-pay

## 2022-01-20 ENCOUNTER — Encounter (HOSPITAL_BASED_OUTPATIENT_CLINIC_OR_DEPARTMENT_OTHER): Payer: Self-pay | Admitting: Pharmacist

## 2022-01-20 ENCOUNTER — Other Ambulatory Visit (HOSPITAL_BASED_OUTPATIENT_CLINIC_OR_DEPARTMENT_OTHER): Payer: Self-pay

## 2022-01-21 ENCOUNTER — Other Ambulatory Visit (HOSPITAL_BASED_OUTPATIENT_CLINIC_OR_DEPARTMENT_OTHER): Payer: Self-pay

## 2022-01-22 NOTE — Telephone Encounter (Signed)
Patient has been notified of the medication refusal.

## 2022-01-26 ENCOUNTER — Ambulatory Visit: Payer: Self-pay | Admitting: Podiatry

## 2022-01-31 ENCOUNTER — Other Ambulatory Visit: Payer: Self-pay | Admitting: Interventional Cardiology

## 2022-02-02 ENCOUNTER — Other Ambulatory Visit (HOSPITAL_BASED_OUTPATIENT_CLINIC_OR_DEPARTMENT_OTHER): Payer: Self-pay

## 2022-02-03 ENCOUNTER — Other Ambulatory Visit (HOSPITAL_BASED_OUTPATIENT_CLINIC_OR_DEPARTMENT_OTHER): Payer: Self-pay

## 2022-02-03 MED ORDER — DILTIAZEM HCL ER COATED BEADS 360 MG PO CP24
ORAL_CAPSULE | ORAL | 2 refills | Status: DC
  Filled 2022-02-03: qty 90, 90d supply, fill #0
  Filled 2022-04-29: qty 90, 90d supply, fill #1
  Filled 2022-07-31: qty 90, 90d supply, fill #2

## 2022-02-09 ENCOUNTER — Other Ambulatory Visit (HOSPITAL_BASED_OUTPATIENT_CLINIC_OR_DEPARTMENT_OTHER): Payer: Self-pay

## 2022-02-09 ENCOUNTER — Other Ambulatory Visit: Payer: Self-pay | Admitting: Family Medicine

## 2022-02-09 MED ORDER — ALBUTEROL SULFATE HFA 108 (90 BASE) MCG/ACT IN AERS
INHALATION_SPRAY | RESPIRATORY_TRACT | 1 refills | Status: DC
  Filled 2022-02-09: qty 6.7, 17d supply, fill #0
  Filled 2022-03-11: qty 6.7, 17d supply, fill #1

## 2022-02-10 ENCOUNTER — Other Ambulatory Visit (HOSPITAL_BASED_OUTPATIENT_CLINIC_OR_DEPARTMENT_OTHER): Payer: Self-pay

## 2022-02-11 ENCOUNTER — Encounter: Payer: Self-pay | Admitting: Family Medicine

## 2022-02-11 ENCOUNTER — Other Ambulatory Visit (HOSPITAL_BASED_OUTPATIENT_CLINIC_OR_DEPARTMENT_OTHER): Payer: Self-pay

## 2022-02-11 ENCOUNTER — Telehealth (INDEPENDENT_AMBULATORY_CARE_PROVIDER_SITE_OTHER): Payer: Medicare Other | Admitting: Family Medicine

## 2022-02-11 VITALS — Ht 73.0 in | Wt 228.4 lb

## 2022-02-11 DIAGNOSIS — J302 Other seasonal allergic rhinitis: Secondary | ICD-10-CM | POA: Diagnosis not present

## 2022-02-11 MED ORDER — PREDNISONE 10 MG PO TABS
ORAL_TABLET | ORAL | 0 refills | Status: DC
  Filled 2022-02-11: qty 15, 5d supply, fill #0

## 2022-02-11 MED ORDER — AZELASTINE HCL 0.1 % NA SOLN
2.0000 | Freq: Two times a day (BID) | NASAL | 11 refills | Status: DC
  Filled 2022-02-11: qty 30, 50d supply, fill #0
  Filled 2022-04-29: qty 30, 50d supply, fill #1
  Filled 2022-07-20: qty 30, 50d supply, fill #2
  Filled 2022-11-11 – 2022-11-19 (×2): qty 30, 50d supply, fill #3

## 2022-02-11 NOTE — Progress Notes (Signed)
Patient ID: Eric Palmer, male   DOB: 01/02/1956, 66 y.o.   MRN: [id number]   Virtual Visit via Video Note  I connected with Eric Palmer on 02/11/22 at  8:30 AM EDT by a video enabled telemedicine application and verified that I am speaking with the correct person using two identifiers.  Location patient: home Location provider:work or home office Persons participating in the virtual visit: patient, provider  I discussed the limitations of evaluation and management by telemedicine and the availability of in person appointments. The patient expressed understanding and agreed to proceed.   HPI:  Eric Palmer has history of seasonal allergies.  He states he has had some allergy symptoms for weeks but especially past week and a half or so.  Frequent rhinorrhea and sneezing.  No fever.  No body aches.  No purulent secretions.  He has tried multiple things including Zyrtec, Allegra, and Xyzal without improvement.  Has previously tried nasal steroids without much success.  Frequent sneezing.  His main concern is he is flying out today to do work in [place] for about 3 days.  He does have history of type 2 diabetes.  Also has history of COPD and is on Advair regularly.  Had some occasional wheezing.  Uses albuterol rescue inhaler as needed   ROS: See pertinent positives and negatives per HPI.  Past Medical History:  Diagnosis Date   Anal fissure    Chronic bronchitis ([place])    "get it q spring and fall" (10/27/2013)   Coronary artery disease    a. s/p PCI in 1996. b. LHC 01/2006 showed 50% mid LAD, otherwise widely patent cors with minimal irregularities. c. Nuclear stress test in 04/2009 was reportedly low risk, no ischemia. // d. Nuclear stress test 2/19: EF 55, transient ST depression in recovery, no ischemia, inf defect (artifact vs scar)    DM type 2, uncontrolled, with neuropathy    ED (erectile dysfunction)    Essential hypertension    Hx of echocardiogram    Echo (10/2013): Mild LVH, moderate  focal basal hypertrophy of the septum, EF 50-55%, no RWMA   Hyperlipidemia    Internal hemorrhoids without mention of complication    Obesity    Obesity    OSA on CPAP    Paresthesia of both legs    Paroxysmal atrial fibrillation ([place])    a. failed Tikosyn, Multaq. b. s/p ablation of AF/AFL in 12/2013.   Paroxysmal atrial flutter ([place])    a. s/p ablation of AF/AFL in 12/2013.   Smoker    QUIT 10/13/13   Tubular adenoma of colon 2016    Past Surgical History:  Procedure Laterality Date   ABLATION  01-12-2014   PVI and CTI by Dr [name]   ANAL FISSURE REPAIR  ~ 2010   [place] N/A 01/12/2014   Procedure: ATRIAL FIBRILLATION ABLATION;  Surgeon: Coralyn Mark, MD;  Location: [facility]ATH [facility];  Service: Cardiovascular;  Laterality: N/A;   CARDIAC CATHETERIZATION  2007   CORONARY ANGIOPLASTY WITH STENT PLACEMENT  1996   residual 50% LAD and RCA by cath in 2007   [place] Right 1963   LEFT HEART CATH AND CORONARY ANGIOGRAPHY N/A 10/11/2017   Procedure: LEFT HEART CATH AND CORONARY ANGIOGRAPHY;  Surgeon: [name], MD;  Location: [facility];  Service: Cardiovascular;  Laterality: N/A;   TEE WITHOUT CARDIOVERSION N/A 01/11/2014   Procedure: TRANSESOPHAGEAL ECHOCARDIOGRAM (TEE);  Surgeon: [name], MD;  Location: [facility] ENDOSCOPY;  Service: Cardiovascular;  Laterality: N/A;   TONSILLECTOMY AND ADENOIDECTOMY  1979    Family History  Problem Relation Age of Onset   Diabetes Mother    Heart disease Mother 70       Arrhythmia   Hypertension Mother    Heart disease Father 77       died 52 CHF   Heart attack Father    Heart disease Brother    Hyperlipidemia Other    Heart disease Other    Stroke Neg Hx    Colon cancer Neg Hx    Esophageal cancer Neg Hx    Liver disease Neg Hx    Pancreatic cancer Neg Hx    Stomach cancer Neg Hx     SOCIAL HX: Quit smoking 2020   Current Outpatient  Medications:    albuterol (VENTOLIN HFA) 108 (90 Base) MCG/ACT inhaler, Use 2 puffs every 4-6 hours as needed for cough/wheeze, Disp: 6.7 g, Rfl: 1   atorvastatin (LIPITOR) 40 MG tablet, Take 1 tablet by mouth daily at 6 pm, Disp: 90 tablet, Rfl: 3   diltiazem (CARDIZEM CD) 360 MG 24 hr capsule, TAKE 1 CAPSULE(360 MG) BY MOUTH DAILY, Disp: 90 capsule, Rfl: 2   empagliflozin (JARDIANCE) 25 MG TABS tablet, Take 1 tablet (25 mg total) by mouth daily., Disp: 90 tablet, Rfl: 1   fluticasone-salmeterol (ADVAIR DISKUS) 100-50 MCG/ACT AEPB, Inhale 1 puff into the lungs twice daily, Disp: 60 each, Rfl: 11   glucose blood (ONETOUCH VERIO) test strip, USE TO CHECK BLOOD SUGAR ONCE DAILY, Disp: 100 each, Rfl: 0   icosapent Ethyl (VASCEPA) 1 g capsule, Take 2 capsules (2 g total) by mouth 2 (two) times daily., Disp: 360 capsule, Rfl: 3   metFORMIN (GLUCOPHAGE-XR) 750 MG 24 hr tablet, Take 1 tablet (750 mg) by mouth twice daily., Disp: 180 tablet, Rfl: 3   nitroGLYCERIN (NITROSTAT) 0.4 MG SL tablet, DISSOLVE 1 TABLET UNDER THE TONGUE EVERY 5 MINUTES FOR 3 DOSES AS NEEDED FOR CHEST PAIN(CALL 911 IF 3RD DOSE IS TAKEN), Disp: 25 tablet, Rfl: 7   NONFORMULARY OR COMPOUNDED ITEM, Pain cream : ketamine 5%, baclofen 2%, gabapentin 5%, lidocaine 5%, menthol 1% Order faxed to [facility], Disp: , Rfl:    ONETOUCH DELICA LANCETS FINE MISC, USE TO CHECK BLOOD SUGAR ONCE DAILY, Disp: 100 each, Rfl: 0   rivaroxaban (XARELTO) 20 MG TABS tablet, TAKE 1 TABLET(20 MG) BY MOUTH DAILY WITH SUPPER, Disp: 90 tablet, Rfl: 1   sotalol (BETAPACE) 80 MG tablet, TAKE 1 TABLET(80 MG) BY MOUTH TWICE DAILY, Disp: 180 tablet, Rfl: 2   tadalafil (CIALIS) 20 MG tablet, Take 1 tablet (20 mg total) by mouth daily as needed for erectile dysfunction., Disp: 10 tablet, Rfl: 3   azelastine (ASTELIN) 0.1 % nasal spray, Place 2 sprays into both nostrils 2 (two) times daily. Use in each nostril as directed, Disp: 30 mL, Rfl: 11   predniSONE  (DELTASONE) 10 MG tablet, Take 3 tablets by mouth once daily for 5 days, Disp: 15 tablet, Rfl: 0  EXAM:  VITALS per patient if applicable:  GENERAL: alert, oriented, appears well and in no acute distress  HEENT: atraumatic, conjunttiva clear, no obvious abnormalities on inspection of external nose and ears  NECK: normal movements of the head and neck  LUNGS: on inspection no signs of respiratory distress, breathing rate appears normal, no obvious gross SOB, gasping or wheezing  CV: no obvious cyanosis  MS: moves all visible extremities without noticeable abnormality  PSYCH/NEURO: pleasant and cooperative, no obvious depression or anxiety, speech and thought processing grossly intact  ASSESSMENT AND PLAN:  Discussed the following assessment and plan:  Seasonal allergic rhinitis, unspecified trigger  -Recommend he consider over-the-counter nasal steroid such as Nasacort AQ -Continue once daily over-the-counter antihistamine -Astelin nasal 1 to 2 sprays per nostril twice daily as needed -We wrote for limited amount of prednisone 10 mg take 3 tablets once daily for 5 days if above not adequately suppressing allergy symptoms.  He is aware this may exacerbate his blood sugars and is encouraged to monitor closely.    I discussed the assessment and treatment plan with the patient. The patient was provided an opportunity to ask questions and all were answered. The patient agreed with the plan and demonstrated an understanding of the instructions.   The patient was advised to call back or seek an in-person evaluation if the symptoms worsen or if the condition fails to improve as anticipated.     Carolann Littler, MD

## 2022-02-16 ENCOUNTER — Other Ambulatory Visit (HOSPITAL_BASED_OUTPATIENT_CLINIC_OR_DEPARTMENT_OTHER): Payer: Self-pay

## 2022-02-17 ENCOUNTER — Other Ambulatory Visit (HOSPITAL_BASED_OUTPATIENT_CLINIC_OR_DEPARTMENT_OTHER): Payer: Self-pay

## 2022-02-19 ENCOUNTER — Ambulatory Visit: Payer: Medicare Other | Admitting: Gastroenterology

## 2022-02-19 ENCOUNTER — Telehealth: Payer: Self-pay

## 2022-02-19 ENCOUNTER — Encounter: Payer: Self-pay | Admitting: Gastroenterology

## 2022-02-19 VITALS — BP 120/60 | HR 72 | Ht 73.0 in | Wt 235.8 lb

## 2022-02-19 DIAGNOSIS — Z7901 Long term (current) use of anticoagulants: Secondary | ICD-10-CM | POA: Diagnosis not present

## 2022-02-19 DIAGNOSIS — Z86010 Personal history of colonic polyps: Secondary | ICD-10-CM | POA: Diagnosis not present

## 2022-02-19 DIAGNOSIS — K648 Other hemorrhoids: Secondary | ICD-10-CM | POA: Diagnosis not present

## 2022-02-19 NOTE — Patient Instructions (Signed)
You have been scheduled for a colonoscopy. Please follow written instructions given to you at your visit today.  Please pick up your prep supplies at the pharmacy within the next 1-3 days. If you use inhalers (even only as needed), please bring them with you on the day of your procedure.  The [facility] providers would like to encourage you to use MYCHART to communicate with providers for non-urgent requests or questions.  Due to long hold times on the telephone, sending your provider a message by MYCHART may be a faster and more efficient way to get a response.  Please allow 48 business hours for a response.  Please remember that this is for non-urgent requests.   Due to recent changes in healthcare laws, you may see the results of your imaging and laboratory studies on MyChart before your provider has had a chance to review them.  We understand that in some cases there may be results that are confusing or concerning to you. Not all laboratory results come back in the same time frame and the provider may be waiting for multiple results in order to interpret others.  Please give us 48 hours in order for your provider to thoroughly review all the results before contacting the office for clarification of your results.   Thank you for choosing me and [facility].  Malcolm T. Stark, Jr., MD., FACG  

## 2022-02-19 NOTE — Progress Notes (Signed)
Assessment     Personal history of adenomatous colon polyps Internal hemorrhoids leading to intermittent small volume hematochezia Loose stools secondary to metformin Afib maintained on Xarelto   Recommendations    Schedule surveillance colonoscopy. The risks (including bleeding, perforation, infection, missed lesions, medication reactions and possible hospitalization or surgery if complications occur), benefits, and alternatives to colonoscopy with possible biopsy and possible polypectomy were discussed with the patient and they consent to proceed.   Preparation H suppositories daily as needed Hold Xarelto 2 days before procedure - will instruct when and how to resume after procedure. Low but real risk of cardiovascular event such as heart attack, stroke, embolism, thrombosis or ischemia/infarct of other organs off Xarelto explained and need to seek urgent help if this occurs. The patient consents to proceed. Will communicate by phone or EMR with patient's prescribing provider to confirm that holding Xarelto is reasonable in this case.     HPI    This is a 66 year old male here for a personal history of adenomatous colon polyps and intermittent small-volume rectal bleeding.  He relates frequent loose stools that he attributes to metformin and he takes an over-the-counter medication to control loose stools although he cannot recall the name of the medication.  He has infrequent episodes of small amounts of bright red blood with a bowel movement.  He feels occasional anal swelling that he attributes to hemorrhoids.  He is due for surveillance colonoscopy.  For paroxysmal atrial fibrillation he is maintained on Xarelto. Denies weight loss, abdominal pain, constipation, change in stool caliber, melena, nausea, vomiting, dysphagia, reflux symptoms, chest pain.   Colonoscopy April 2016 1. Sessile polyp in the transverse colon; polypectomy performed with a cold snare: TA 2. Moderate  diverticulosis in the sigmoid colon and ascending colon 3. Grade l internal hemorrhoids   Labs / Imaging       Latest Ref Rng & Units 12/31/2021   11:11 AM 06/04/2021    7:42 AM 07/01/2020    2:11 PM  Hepatic Function  Total Protein 6.0 - 8.3 g/dL 7.2  6.8  7.2   Albumin 3.5 - 5.2 g/dL 4.6  4.3    AST 0 - 37 U/L _0 ALT 0 - 53 U/L _1 Alk Phosphatase 39 - 117 U/L 71  58    Total Bilirubin 0.2 - 1.2 mg/dL 0.7  0.8  0.7   Bilirubin, Direct 0.0 - 0.3 mg/dL 0.1  0.1  0.1        Latest Ref Rng & Units 12/31/2021   11:11 AM 06/04/2021    7:42 AM 10/03/2020    8:00 AM  CBC  WBC 4.0 - 10.5 K/uL 7.5  6.6  8.1   Hemoglobin 13.0 - 17.0 g/dL 16.2  15.9  16.1   Hematocrit 39.0 - 52.0 % 48.2  47.1  47.5   Platelets 150.0 - 400.0 K/uL 244.0  238.0  229.0    Current Medications, Allergies, Past Medical History, Past Surgical History, Family History and Social History were reviewed in [employer] record.   Physical Exam: General: Well developed, well nourished, no acute distress Head: Normocephalic and atraumatic Eyes: Sclerae anicteric, EOMI Ears: Normal auditory acuity Mouth: Not examined Lungs: Clear throughout to auscultation Heart: Regular rate and rhythm; no murmurs, rubs or bruits Abdomen: Soft, non tender and non distended. No masses, hepatosplenomegaly or hernias noted. Normal Bowel sounds Rectal:  Deferred to  colonoscopy  Musculoskeletal: Symmetrical with no gross deformities  Pulses:  Normal pulses noted Extremities: No clubbing, cyanosis, edema or deformities noted Neurological: Alert oriented x 4, grossly nonfocal Psychological:  Alert and cooperative. Normal mood and affect   Davyon Fisch T. Fuller Plan, MD 02/19/2022, 8:34 AM

## 2022-02-19 NOTE — Telephone Encounter (Signed)
[facility] Pre-operative Risk Assessment     Request for surgical clearance:     Endoscopy Procedure  What type of surgery is being performed?     colonoscopy  When is this surgery scheduled?     03/09/22  What type of clearance is required ?   Pharmacy  Are there any medications that need to be held prior to surgery and how long? Xarelto  Practice name and name of physician performing surgery?      [facility]  What is your office phone and fax number?      Phone- [phone]  [phone]  Anesthesia type (None, local, MAC, general) ?       MAC

## 2022-02-25 NOTE — Telephone Encounter (Signed)
Informed patient he can hold his Xarelto 2 days prior to his procedure per Cardiology.

## 2022-02-25 NOTE — Telephone Encounter (Signed)
   Patient Name: Eric Palmer  DOB: 07-12-1956 MRN: [id number]  Primary Cardiologist: [name], MD  Clinical pharmacists have reviewed past medical history, labs, and medications as part of preoperative protocol coverage.    The following recommendations have been made:   Patient with diagnosis of afib on Xarelto for anticoagulation.     Procedure: colonoscopy Date of procedure: 03/09/22   CHA2DS2-VASc Score = 4  This indicates a 4.8% annual risk of stroke. The patient's score is based upon: CHF History: 0 HTN History: 1 Diabetes History: 1 Stroke History: 0 Vascular Disease History: 1 Age Score: 1 Gender Score: 0   CrCl 24m/min using adjusted body weight Platelet count 244K   Per office protocol, patient can hold Xarelto for 1-2 days prior to procedure.    I will route this recommendation to the requesting party via epic fax function and remove from pre-op pool.  Please call with any questions.  ELenna Sciara NP 02/25/2022, 11:37 AM

## 2022-02-25 NOTE — Telephone Encounter (Signed)
Patient with diagnosis of afib on Xarelto for anticoagulation.    Procedure: colonoscopy Date of procedure: 03/09/22  CHA2DS2-VASc Score = 4  This indicates a 4.8% annual risk of stroke. The patient's score is based upon: CHF History: 0 HTN History: 1 Diabetes History: 1 Stroke History: 0 Vascular Disease History: 1 Age Score: 1 Gender Score: 0   CrCl 20m/min using adjusted body weight Platelet count 244K  Per office protocol, patient can hold Xarelto for 1-2 days prior to procedure.    **This guidance is not considered finalized until pre-operative APP has relayed final recommendations.**

## 2022-02-26 ENCOUNTER — Other Ambulatory Visit (HOSPITAL_BASED_OUTPATIENT_CLINIC_OR_DEPARTMENT_OTHER): Payer: Self-pay

## 2022-02-26 ENCOUNTER — Ambulatory Visit: Payer: Medicare Other | Admitting: Family Medicine

## 2022-03-02 ENCOUNTER — Encounter: Payer: Self-pay | Admitting: Gastroenterology

## 2022-03-03 ENCOUNTER — Other Ambulatory Visit (HOSPITAL_BASED_OUTPATIENT_CLINIC_OR_DEPARTMENT_OTHER): Payer: Self-pay

## 2022-03-09 ENCOUNTER — Ambulatory Visit (AMBULATORY_SURGERY_CENTER): Payer: Medicare Other | Admitting: Gastroenterology

## 2022-03-09 ENCOUNTER — Encounter: Payer: Self-pay | Admitting: Gastroenterology

## 2022-03-09 VITALS — BP 99/48 | HR 64 | Temp 98.6°F | Resp 13 | Ht 73.0 in | Wt 235.0 lb

## 2022-03-09 DIAGNOSIS — Z09 Encounter for follow-up examination after completed treatment for conditions other than malignant neoplasm: Secondary | ICD-10-CM

## 2022-03-09 DIAGNOSIS — Z86010 Personal history of colonic polyps: Secondary | ICD-10-CM | POA: Diagnosis not present

## 2022-03-09 MED ORDER — SODIUM CHLORIDE 0.9 % IV SOLN: 500.0000 mL | Freq: Once | INTRAVENOUS | Status: DC

## 2022-03-09 NOTE — Op Note (Signed)
[facility] Patient Name: Eric Palmer Procedure Date: 03/09/2022 7:20 AM MRN: [id number] Endoscopist: Ladene Artist , MD Age: 66 Referring MD:  Date of Birth: Jan 05, 1956 Gender: Male Account #: [id number] Procedure:                Colonoscopy Indications:              Surveillance: Personal history of adenomatous                            polyps on last colonoscopy > 5 years ago Medicines:                Monitored Anesthesia Care Procedure:                Pre-Anesthesia Assessment:                           - Prior to the procedure, a History and Physical                            was performed, and patient medications and                            allergies were reviewed. The patient's tolerance of                            previous anesthesia was also reviewed. The risks                            and benefits of the procedure and the sedation                            options and risks were discussed with the patient.                            All questions were answered, and informed consent                            was obtained. Prior Anticoagulants: The patient has                            taken Xarelto (rivaroxaban), last dose was 2 days                            prior to procedure. ASA Grade Assessment: II - A                            patient with mild systemic disease. After reviewing                            the risks and benefits, the patient was deemed in                            satisfactory condition to undergo the procedure.  After obtaining informed consent, the colonoscope                            was passed under direct vision. Throughout the                            procedure, the patient's blood pressure, pulse, and                            oxygen saturations were monitored continuously. The                            CF [id number] #[id number] was introduced through the anus                            and  advanced to the the cecum, identified by                            appendiceal orifice and ileocecal valve. The                            ileocecal valve, appendiceal orifice, and rectum                            were photographed. The quality of the bowel                            preparation was good. The colonoscopy was performed                            without difficulty. The patient tolerated the                            procedure well. Scope In: 8:01:59 AM Scope Out: 8:18:50 AM Scope Withdrawal Time: 0 hours 12 minutes 24 seconds  Total Procedure Duration: 0 hours 16 minutes 51 seconds  Findings:                 The perianal and digital rectal examinations were                            normal.                           Scattered medium-mouthed diverticula were found in                            the right colon. There was no evidence of                            diverticular bleeding.                           Multiple medium-mouthed diverticula were found in  the left colon. There was evidence of diverticular                            spasm. There was no evidence of diverticular                            bleeding.                           Internal hemorrhoids were found during                            retroflexion. The hemorrhoids were moderate and                            Grade I (internal hemorrhoids that do not prolapse).                           The exam was otherwise without abnormality on                            direct and retroflexion views. Complications:            No immediate complications. Estimated blood loss:                            None. Estimated Blood Loss:     Estimated blood loss: none. Impression:               - Mild diverticulosis in the right colon. There was                            no evidence of diverticular bleeding.                           - Moderate diverticulosis in the left colon. There                             was evidence of diverticular spasm. There was no                            evidence of diverticular bleeding.                           - Internal hemorrhoids.                           - The examination was otherwise normal on direct                            and retroflexion views.                           - No specimens collected. Recommendation:           - Repeat colonoscopy in 10 years for surveillance.                           -  Patient has a contact number available for                            emergencies. The signs and symptoms of potential                            delayed complications were discussed with the                            patient. Return to normal activities tomorrow.                            Written discharge instructions were provided to the                            patient.                           - High fiber diet.                           - Continue present medications.                           - Consider hemorrhoid banding if rectal bleeding                            persists.                           - Resume Xarelto (rivaroxaban) at prior dose                            tomorrow. Refer to managing physician for further                            adjustment of therapy. Ladene Artist, MD 03/09/2022 8:23:11 AM This report has been signed electronically.

## 2022-03-09 NOTE — Progress Notes (Signed)
Vs by CW in adm  Pt's states no medical or surgical changes since previsit or office visit.     

## 2022-03-09 NOTE — Progress Notes (Signed)
See 02/19/2022 H&P, no change

## 2022-03-09 NOTE — Progress Notes (Signed)
Vss nad trans to pacu °

## 2022-03-09 NOTE — Patient Instructions (Signed)
HANDOUTS ON DIVERTICULOSIS AND HEMORRHOIDS GIVEN.  HIGH FIBER DIET.  CONSIDER HEMORRHOID BANDING IF RECTAL BLEEDING PERSISTS.   RESUME XARELTO ( RIVAROXABAN) AT PRIOR DOES TOMORROW. REFER TO MANAGING PHYSICIAN FOR FURTHER ADJUSTMENT OF THERAPY.     YOU HAD AN ENDOSCOPIC PROCEDURE TODAY AT [facility]:   Refer to the procedure report that was given to you for any specific questions about what was found during the examination.  If the procedure report does not answer your questions, please call your gastroenterologist to clarify.  If you requested that your care partner not be given the details of your procedure findings, then the procedure report has been included in a sealed envelope for you to review at your convenience later.  YOU SHOULD EXPECT: Some feelings of bloating in the abdomen. Passage of more gas than usual.  Walking can help get rid of the air that was put into your GI tract during the procedure and reduce the bloating. If you had a lower endoscopy (such as a colonoscopy or flexible sigmoidoscopy) you may notice spotting of blood in your stool or on the toilet paper. If you underwent a bowel prep for your procedure, you may not have a normal bowel movement for a few days.  Please Note:  You might notice some irritation and congestion in your nose or some drainage.  This is from the oxygen used during your procedure.  There is no need for concern and it should clear up in a day or so.  SYMPTOMS TO REPORT IMMEDIATELY:  Following lower endoscopy (colonoscopy or flexible sigmoidoscopy):  Excessive amounts of blood in the stool  Significant tenderness or worsening of abdominal pains  Swelling of the abdomen that is new, acute  Fever of 100F or higher    For urgent or emergent issues, a gastroenterologist can be reached at any hour by calling [phone]. Do not use MyChart messaging for urgent concerns.    DIET:  We do recommend a small meal at first, but  then you may proceed to your regular diet.  Drink plenty of fluids but you should avoid alcoholic beverages for 24 hours.  ACTIVITY:  You should plan to take it easy for the rest of today and you should NOT DRIVE or use heavy machinery until tomorrow (because of the sedation medicines used during the test).    FOLLOW UP: Our staff will call the number listed on your records the next business day following your procedure.  We will call around 7:15- 8:00 am to check on you and address any questions or concerns that you may have regarding the information given to you following your procedure. If we do not reach you, we will leave a message.  If you develop any symptoms (ie: fever, flu-like symptoms, shortness of breath, cough etc.) before then, please call [phone].  If you test positive for Covid 19 in the 2 weeks post procedure, please call and report this information to [place].    If any biopsies were taken you will be contacted by phone or by letter within the next 1-3 weeks.  Please call us at [phone] if you have not heard about the biopsies in 3 weeks.    SIGNATURES/CONFIDENTIALITY: You and/or your care partner have signed paperwork which will be entered into your electronic medical record.  These signatures attest to the fact that that the information above on your After Visit Summary has been reviewed and is understood.  Full responsibility of the confidentiality of  this discharge information lies with you and/or your care-partner.

## 2022-03-10 ENCOUNTER — Telehealth: Payer: Self-pay

## 2022-03-10 NOTE — Telephone Encounter (Signed)
  Follow up Call-     03/09/2022    7:16 AM  Call back number  Post procedure Call Back phone  # [phone]  Permission to leave phone message Yes     Patient questions:  Do you have a fever, pain , or abdominal swelling? No. Pain Score  0 *  Have you tolerated food without any problems? Yes.    Have you been able to return to your normal activities? Yes.    Do you have any questions about your discharge instructions: Diet   No. Medications  No. Follow up visit  No.  Do you have questions or concerns about your Care? No.  Actions: * If pain score is 4 or above: No action needed, pain <4.

## 2022-03-11 ENCOUNTER — Other Ambulatory Visit (HOSPITAL_BASED_OUTPATIENT_CLINIC_OR_DEPARTMENT_OTHER): Payer: Self-pay

## 2022-03-11 ENCOUNTER — Other Ambulatory Visit: Payer: Self-pay | Admitting: Family Medicine

## 2022-03-11 MED FILL — Fluticasone-Salmeterol Aer Powder BA 100-50 MCG/ACT: RESPIRATORY_TRACT | 30 days supply | Qty: 60 | Fill #4 | Status: AC

## 2022-03-12 ENCOUNTER — Other Ambulatory Visit (HOSPITAL_BASED_OUTPATIENT_CLINIC_OR_DEPARTMENT_OTHER): Payer: Self-pay

## 2022-03-12 MED ORDER — ONETOUCH VERIO VI STRP
ORAL_STRIP | 0 refills | Status: DC
  Filled 2022-03-12: qty 50, 50d supply, fill #0
  Filled 2022-04-29: qty 50, 50d supply, fill #1

## 2022-03-16 ENCOUNTER — Other Ambulatory Visit (HOSPITAL_BASED_OUTPATIENT_CLINIC_OR_DEPARTMENT_OTHER): Payer: Self-pay

## 2022-04-01 ENCOUNTER — Other Ambulatory Visit: Payer: Self-pay | Admitting: Family Medicine

## 2022-04-01 ENCOUNTER — Other Ambulatory Visit (HOSPITAL_BASED_OUTPATIENT_CLINIC_OR_DEPARTMENT_OTHER): Payer: Self-pay

## 2022-04-01 MED ORDER — ALBUTEROL SULFATE HFA 108 (90 BASE) MCG/ACT IN AERS
INHALATION_SPRAY | RESPIRATORY_TRACT | 1 refills | Status: DC
  Filled 2022-04-01: qty 6.7, 17d supply, fill #0
  Filled 2022-04-29: qty 6.7, 17d supply, fill #1

## 2022-04-06 ENCOUNTER — Ambulatory Visit (INDEPENDENT_AMBULATORY_CARE_PROVIDER_SITE_OTHER): Payer: Medicare Other | Admitting: Family Medicine

## 2022-04-06 ENCOUNTER — Encounter: Payer: Self-pay | Admitting: Family Medicine

## 2022-04-06 ENCOUNTER — Other Ambulatory Visit (HOSPITAL_BASED_OUTPATIENT_CLINIC_OR_DEPARTMENT_OTHER): Payer: Self-pay

## 2022-04-06 VITALS — BP 124/70 | HR 78 | Temp 98.0°F | Ht 73.0 in | Wt 231.7 lb

## 2022-04-06 DIAGNOSIS — E1142 Type 2 diabetes mellitus with diabetic polyneuropathy: Secondary | ICD-10-CM | POA: Diagnosis not present

## 2022-04-06 DIAGNOSIS — E0841 Diabetes mellitus due to underlying condition with diabetic mononeuropathy: Secondary | ICD-10-CM | POA: Diagnosis not present

## 2022-04-06 LAB — POCT GLYCOSYLATED HEMOGLOBIN (HGB A1C): Hemoglobin A1C: 7.6 % — AB (ref 4.0–5.6)

## 2022-04-06 MED FILL — Fluticasone-Salmeterol Aer Powder BA 100-50 MCG/ACT: RESPIRATORY_TRACT | 30 days supply | Qty: 60 | Fill #5 | Status: AC

## 2022-04-06 NOTE — Progress Notes (Signed)
Established Patient Office Visit  Subjective   Patient ID: Eric Palmer, male    DOB: [birth year]  Age: 66 y.o. MRN: [id number]  Chief Complaint  Patient presents with   Follow-up    HPI   Eric Palmer is seen regarding his type 2 diabetes.  He takes Jardiance 25 mg daily and metformin extended release 750 mg twice daily.  He has lost a few pounds since last visit and feels like he is trying to be more diligent with diet.  He is frustrated that his A1c is gone from 7.3% to 7.6%.  He does eat some fruit with things like bananas daily but has scaled back considerably with beverage related sources of glucose.  He is not yet ready to consider GLP-1 or other medication options.  He has ongoing problems with neuropathic pain in both feet.  Was intolerant of gabapentin and Lyrica.  Podiatrist has prescribed tramadol which is helped some.  Has night pain is worse.  He has history of atrial fibrillation, CAD, hypertension, obstructive sleep apnea, type 2 diabetes, lower extremity diabetic neuropathy  Past Medical History:  Diagnosis Date   Allergy    Anal fissure    Chronic bronchitis ([place])    "get it q spring and fall" (10/27/2013)   Coronary artery disease    a. s/p PCI in 1996. b. [place] 01/2006 showed 50% mid LAD, otherwise widely patent cors with minimal irregularities. c. Nuclear stress test in 04/2009 was reportedly low risk, no ischemia. // d. Nuclear stress test 2/19: EF 55, transient ST depression in recovery, no ischemia, inf defect (artifact vs scar)    DM type 2, uncontrolled, with neuropathy    ED (erectile dysfunction)    Essential hypertension    Hx of echocardiogram    Echo (10/2013): Mild LVH, moderate focal basal hypertrophy of the septum, EF 50-55%, no RWMA   Hyperlipidemia    Internal hemorrhoids without mention of complication    Obesity    Obesity    OSA on CPAP    Paresthesia of both legs    Paroxysmal atrial fibrillation ([place])    a. failed Tikosyn, Multaq. b. s/p ablation  of AF/AFL in 12/2013.   Paroxysmal atrial flutter ([place])    a. s/p ablation of AF/AFL in 12/2013.   Sleep apnea    Smoker    QUIT 10/13/13   Tubular adenoma of colon 2016   Past Surgical History:  Procedure Laterality Date   ABLATION  01-12-2014   PVI and CTI by Dr [name]   ANAL FISSURE REPAIR  ~ 2010   [place] N/A 01/12/2014   Procedure: ATRIAL FIBRILLATION ABLATION;  Surgeon: Coralyn Mark, MD;  Location: [facility] CATH [facility];  Service: Cardiovascular;  Laterality: N/A;   CARDIAC CATHETERIZATION  2007   CORONARY ANGIOPLASTY WITH STENT PLACEMENT  1996   residual 50% LAD and RCA by cath in 2007   [place] Right 1963   LEFT HEART CATH AND CORONARY ANGIOGRAPHY N/A 10/11/2017   Procedure: LEFT HEART CATH AND CORONARY ANGIOGRAPHY;  Surgeon: [name], MD;  Location: [facility];  Service: Cardiovascular;  Laterality: N/A;   TEE WITHOUT CARDIOVERSION N/A 01/11/2014   Procedure: TRANSESOPHAGEAL ECHOCARDIOGRAM (TEE);  Surgeon: [name], MD;  Location: [facility];  Service: Cardiovascular;  Laterality: N/A;   [place]    reports that he quit smoking about 3 years ago. His  smoking use included cigarettes. He has a 43.00 pack-year smoking history. He has never used smokeless tobacco. He reports current alcohol use. He reports that he does not use drugs. family history includes Diabetes in his mother; Heart attack in his father; Heart disease in his brother and another family member; Heart disease (age of onset: 77) in his father; Heart disease (age of onset: 41) in his mother; Hyperlipidemia in an other family member; Hypertension in his mother. Allergies  Allergen Reactions   Cephalosporins Anaphylaxis   Penicillins Anaphylaxis and Other (See Comments)    Has patient had a PCN reaction causing immediate rash, facial/tongue/throat swelling, SOB or lightheadedness with  hypotension: Yes Has patient had a PCN reaction causing severe rash involving mucus membranes or skin necrosis: No Has patient had a PCN reaction that required hospitalization: No - went to doctors office Has patient had a PCN reaction occurring within the last 10 years: No If all of the above answers are "NO", then may proceed with Cephalosporin use.    Thallium Itching and Rash   Codeine Itching   Gabapentin Other (See Comments)    "made me feel drugged"   Eliquis [Apixaban] Diarrhea    Review of Systems  Constitutional:  Negative for malaise/fatigue.  Eyes:  Negative for blurred vision.  Respiratory:  Negative for shortness of breath.   Cardiovascular:  Negative for chest pain.  Neurological:  Negative for dizziness, weakness and headaches.      Objective:     BP 124/70 (BP Location: Left Arm, Patient Position: Sitting, Cuff Size: Normal)   Pulse 78   Temp 98 F (36.7 C) (Oral)   Ht '6\' 1"'$  (1.854 m)   Wt 231 lb 11.2 oz (105.1 kg)   SpO2 99%   BMI 30.57 kg/m    Physical Exam Constitutional:      Appearance: He is well-developed.  Eyes:     Pupils: Pupils are equal, round, and reactive to light.  Neck:     Thyroid: No thyromegaly.  Cardiovascular:     Rate and Rhythm: Normal rate and regular rhythm.  Pulmonary:     Effort: Pulmonary effort is normal. No respiratory distress.     Breath sounds: Normal breath sounds. No wheezing or rales.  Musculoskeletal:     Cervical back: Neck supple.     Right lower leg: No edema.     Left lower leg: No edema.  Neurological:     Mental Status: He is alert and oriented to person, place, and time.      Results for orders placed or performed in visit on 04/06/22  POCT glycosylated hemoglobin (Hb A1C)  Result Value Ref Range   Hemoglobin A1C 7.6 (A) 4.0 - 5.6 %   HbA1c POC (<> result, manual entry)     HbA1c, POC (prediabetic range)     HbA1c, POC (controlled diabetic range)        The ASCVD Risk score (Arnett DK, et  al., 2019) failed to calculate for the following reasons:   The valid total cholesterol range is 130 to 320 mg/dL    Assessment & Plan:   #1 type 2 diabetes.  Slightly worsening control with A1c 7.6%.  He is aware of goal less than 7.  We discussed options including adding GLP-1 medication.  He declines.  He prefers to give this 3 more months of lifestyle modification.  If not improving at that point consider GLP-1 such as Trulicity or Ozempic.  No known contraindications.  #2  diabetic peripheral neuropathy especially involving feet.  Quite bothersome at times.  Previous intolerance to gabapentin and Lyrica.  Does get some benefit from tramadol and will remain on that for nighttime symptoms   Return in about 3 months (around 07/07/2022).    Carolann Littler, MD

## 2022-04-06 NOTE — Patient Instructions (Signed)
A1C 7.6%  We need to consider GLP-1 medication if not improving at follow up.

## 2022-04-13 ENCOUNTER — Other Ambulatory Visit (HOSPITAL_BASED_OUTPATIENT_CLINIC_OR_DEPARTMENT_OTHER): Payer: Self-pay

## 2022-04-17 ENCOUNTER — Other Ambulatory Visit (HOSPITAL_BASED_OUTPATIENT_CLINIC_OR_DEPARTMENT_OTHER): Payer: Self-pay

## 2022-04-30 ENCOUNTER — Other Ambulatory Visit (HOSPITAL_BASED_OUTPATIENT_CLINIC_OR_DEPARTMENT_OTHER): Payer: Self-pay

## 2022-05-15 ENCOUNTER — Other Ambulatory Visit (HOSPITAL_BASED_OUTPATIENT_CLINIC_OR_DEPARTMENT_OTHER): Payer: Self-pay

## 2022-05-22 ENCOUNTER — Other Ambulatory Visit: Payer: Self-pay | Admitting: Interventional Cardiology

## 2022-05-22 ENCOUNTER — Other Ambulatory Visit (HOSPITAL_BASED_OUTPATIENT_CLINIC_OR_DEPARTMENT_OTHER): Payer: Self-pay

## 2022-05-22 DIAGNOSIS — I480 Paroxysmal atrial fibrillation: Secondary | ICD-10-CM

## 2022-05-22 MED ORDER — RIVAROXABAN 20 MG PO TABS
20.0000 mg | ORAL_TABLET | Freq: Every day | ORAL | 1 refills | Status: DC
  Filled 2022-05-22: qty 30, 30d supply, fill #0
  Filled 2022-06-17: qty 30, 30d supply, fill #1
  Filled 2022-07-20: qty 30, 30d supply, fill #2
  Filled 2022-08-22: qty 30, 30d supply, fill #3
  Filled 2022-08-22: qty 90, 90d supply, fill #3

## 2022-05-22 NOTE — Telephone Encounter (Signed)
Prescription refill request for Xarelto received.  Indication:Afib Last office visit:3/23 Weight:105.1 kg Age:66 Scr:0.9 CrCl:121.64 ml/min  Prescription refilled

## 2022-06-04 ENCOUNTER — Other Ambulatory Visit: Payer: Self-pay | Admitting: Cardiology

## 2022-06-05 ENCOUNTER — Other Ambulatory Visit (HOSPITAL_BASED_OUTPATIENT_CLINIC_OR_DEPARTMENT_OTHER): Payer: Self-pay

## 2022-06-05 MED ORDER — SOTALOL HCL 80 MG PO TABS
80.0000 mg | ORAL_TABLET | Freq: Two times a day (BID) | ORAL | 1 refills | Status: DC
  Filled 2022-06-05: qty 180, 90d supply, fill #0
  Filled 2022-09-02: qty 180, 90d supply, fill #1

## 2022-06-17 ENCOUNTER — Other Ambulatory Visit (HOSPITAL_BASED_OUTPATIENT_CLINIC_OR_DEPARTMENT_OTHER): Payer: Self-pay

## 2022-06-17 ENCOUNTER — Other Ambulatory Visit: Payer: Self-pay | Admitting: Family Medicine

## 2022-06-17 DIAGNOSIS — E1142 Type 2 diabetes mellitus with diabetic polyneuropathy: Secondary | ICD-10-CM

## 2022-06-17 MED ORDER — EMPAGLIFLOZIN 25 MG PO TABS
25.0000 mg | ORAL_TABLET | Freq: Every day | ORAL | 1 refills | Status: DC
  Filled 2022-06-17: qty 30, 30d supply, fill #0
  Filled 2022-07-20: qty 30, 30d supply, fill #1
  Filled 2022-08-21: qty 90, 90d supply, fill #2
  Filled 2022-08-21: qty 30, 30d supply, fill #2
  Filled 2022-11-12 – 2022-11-19 (×2): qty 30, 30d supply, fill #3

## 2022-06-17 MED ORDER — ALBUTEROL SULFATE HFA 108 (90 BASE) MCG/ACT IN AERS
INHALATION_SPRAY | RESPIRATORY_TRACT | 1 refills | Status: DC
  Filled 2022-06-17: qty 6.7, 17d supply, fill #0
  Filled 2022-07-20: qty 6.7, 17d supply, fill #1

## 2022-06-30 ENCOUNTER — Other Ambulatory Visit: Payer: Self-pay | Admitting: Family Medicine

## 2022-07-01 ENCOUNTER — Other Ambulatory Visit (HOSPITAL_BASED_OUTPATIENT_CLINIC_OR_DEPARTMENT_OTHER): Payer: Self-pay

## 2022-07-01 MED ORDER — METFORMIN HCL ER 750 MG PO TB24
ORAL_TABLET | ORAL | 1 refills | Status: DC
  Filled 2022-07-01: qty 180, 90d supply, fill #0
  Filled 2022-09-28: qty 180, 90d supply, fill #1

## 2022-07-02 ENCOUNTER — Other Ambulatory Visit (HOSPITAL_BASED_OUTPATIENT_CLINIC_OR_DEPARTMENT_OTHER): Payer: Self-pay

## 2022-07-06 DIAGNOSIS — H04123 Dry eye syndrome of bilateral lacrimal glands: Secondary | ICD-10-CM | POA: Diagnosis not present

## 2022-07-06 DIAGNOSIS — E119 Type 2 diabetes mellitus without complications: Secondary | ICD-10-CM | POA: Diagnosis not present

## 2022-07-06 DIAGNOSIS — H35363 Drusen (degenerative) of macula, bilateral: Secondary | ICD-10-CM | POA: Diagnosis not present

## 2022-07-06 DIAGNOSIS — H2513 Age-related nuclear cataract, bilateral: Secondary | ICD-10-CM | POA: Diagnosis not present

## 2022-07-06 LAB — HM DIABETES EYE EXAM

## 2022-07-07 ENCOUNTER — Ambulatory Visit (INDEPENDENT_AMBULATORY_CARE_PROVIDER_SITE_OTHER): Payer: Medicare Other | Admitting: Family Medicine

## 2022-07-07 ENCOUNTER — Encounter: Payer: Self-pay | Admitting: Family Medicine

## 2022-07-07 VITALS — BP 104/68 | HR 71 | Temp 97.8°F | Wt 228.6 lb

## 2022-07-07 DIAGNOSIS — Z23 Encounter for immunization: Secondary | ICD-10-CM | POA: Diagnosis not present

## 2022-07-07 DIAGNOSIS — Z0000 Encounter for general adult medical examination without abnormal findings: Secondary | ICD-10-CM

## 2022-07-07 DIAGNOSIS — E1142 Type 2 diabetes mellitus with diabetic polyneuropathy: Secondary | ICD-10-CM

## 2022-07-07 LAB — POCT GLYCOSYLATED HEMOGLOBIN (HGB A1C): Hemoglobin A1C: 7 % — AB (ref 4.0–5.6)

## 2022-07-07 NOTE — Patient Instructions (Addendum)
Keep up the good work!  A1C has improved from 7.6 to 7.0%  Let's plan on 6 month follow up. [place]

## 2022-07-07 NOTE — Progress Notes (Signed)
Established Patient Office Visit  Subjective   Patient ID: GWENDOLYN NISHI, male    DOB: 1955-09-27  Age: 66 y.o. MRN: [id number]  Chief Complaint  Patient presents with   Annual Exam    AWV initial    HPI   Pt here for "Welcome to Medicare" initial exam and diabetes follow-up.  He currently takes Jardiance and extended release metformin for his diabetes.  Last A1c was 7.6%.  He has managed to lose some weight and has been more diligent regarding sugars and starches.  A1c improved today to 7.0.  He just had eye exam yesterday.  1.  Risk factors based on Past Medical , Social, and Family history reviewed and as indicated above with no changes  Past Medical History:  Diagnosis Date   Allergy    Anal fissure    Chronic bronchitis ([place])    "get it q spring and fall" (10/27/2013)   Coronary artery disease    a. s/p PCI in 1996. b. [place] 01/2006 showed 50% mid LAD, otherwise widely patent cors with minimal irregularities. c. Nuclear stress test in 04/2009 was reportedly low risk, no ischemia. // d. Nuclear stress test 2/19: EF 55, transient ST depression in recovery, no ischemia, inf defect (artifact vs scar)    DM type 2, uncontrolled, with neuropathy    ED (erectile dysfunction)    Essential hypertension    Hx of echocardiogram    Echo (10/2013): Mild LVH, moderate focal basal hypertrophy of the septum, EF 50-55%, no RWMA   Hyperlipidemia    Internal hemorrhoids without mention of complication    Obesity    Obesity    OSA on CPAP    Paresthesia of both legs    Paroxysmal atrial fibrillation ([place])    a. failed Tikosyn, Multaq. b. s/p ablation of AF/AFL in 12/2013.   Paroxysmal atrial flutter ([place])    a. s/p ablation of AF/AFL in 12/2013.   Sleep apnea    Smoker    QUIT 10/13/13   Tubular adenoma of colon 2016     2.  Limitations in physical activities None.  No recent falls. Good balance.  Does have some chronic peripheral neuropathy.  3.  Depression/mood No active depression or  anxiety issues.  PHQ-2=0  4.  Hearing No deficits.  Frequent tinnitus.    5.  ADLs independent in all.  6.  Cognitive function (orientation to time and place, language, writing, speech,memory) no short or long term memory issues.  Language and judgement intact.  No concerns.   7.  Home Safety no issues  discussed risk factors for falls.    8.  Height, weight, and visual acuity.all stable.  Wt Readings from Last 3 Encounters:  07/07/22 228 lb 9.6 oz (103.7 kg)  04/06/22 231 lb 11.2 oz (105.1 kg)  03/09/22 235 lb (106.6 kg)   Saw ophthalmologist yesterday.   No concerns. Visual acuity normal yesterday.    9.  Counseling discussed .  Counseled regarding age and gender appropriate preventative screenings and immunizations.  10. Recommendation of preventive services.  Recommend influenza vaccine and Prevnar 20.  Also discussed possible Shingrix.  He has had Zostavax previously.  11. Labs based on risk factors-A1c  12. Care Plan-as below  13. Other Providers-Dr. Herbert Deaner, ophthalmology Dr. Tamala Julian, cardiology  14. Written schedule of screening/prevention services given to patient.  Health Maintenance  Topic Date Due   Medicare Annual Wellness (AWV)  Never done   Zoster Vaccines- Shingrix (1 of 2)  Never done   COVID-19 Vaccine (2 - [facility] risk series) 03/21/2020   TETANUS/TDAP  01/31/2022   Lung Cancer Screening  07/28/2022   Diabetic kidney evaluation - GFR measurement  01/01/2023   Diabetic kidney evaluation - Urine ACR  01/01/2023   FOOT EXAM  01/01/2023   HEMOGLOBIN A1C  01/05/2023   OPHTHALMOLOGY EXAM  07/07/2023   COLONOSCOPY (Pts 45-97yr Insurance coverage will need to be confirmed)  03/09/2032   Pneumonia Vaccine 65 Years old  Completed   INFLUENZA VACCINE  Completed   Hepatitis C Screening  Completed   HIV Screening  Completed   HPV VACCINES  Aged Out    15.  Advanced Directives-  has Living Will and Medical Power of AS[place]astian  Review of Systems   Constitutional:  Negative for malaise/fatigue.  Eyes:  Negative for blurred vision.  Respiratory:  Negative for shortness of breath.   Cardiovascular:  Negative for chest pain.  Gastrointestinal:  Negative for abdominal pain.  Neurological:  Negative for dizziness, weakness and headaches.      Objective:     There were no vitals taken for this visit. BP Readings from Last 3 Encounters:  07/07/22 104/68  04/06/22 124/70  03/09/22 (!) 99/48   Wt Readings from Last 3 Encounters:  07/07/22 228 lb 9.6 oz (103.7 kg)  04/06/22 231 lb 11.2 oz (105.1 kg)  03/09/22 235 lb (106.6 kg)      Physical Exam Vitals reviewed.  Constitutional:      Appearance: He is well-developed.  HENT:     Right Ear: External ear normal.     Left Ear: External ear normal.  Eyes:     Pupils: Pupils are equal, round, and reactive to light.  Neck:     Thyroid: No thyromegaly.  Cardiovascular:     Rate and Rhythm: Normal rate and regular rhythm.  Pulmonary:     Effort: Pulmonary effort is normal. No respiratory distress.     Breath sounds: Normal breath sounds. No wheezing or rales.  Musculoskeletal:     Cervical back: Neck supple.     Right lower leg: No edema.     Left lower leg: No edema.  Neurological:     Mental Status: He is alert and oriented to person, place, and time.      No results found for any visits on 07/07/22.    The ASCVD Risk score (Arnett DK, et al., 2019) failed to calculate for the following reasons:   The valid total cholesterol range is 130 to 320 mg/dL    Assessment & Plan:   #1 Welcome to Medicare physical exam.  We discussed multiple preventative issues as follows  -Just had full vision screen yesterday with ophthalmology -Flu vaccine given -Prevnar 20 given -Discussed Shingrix vaccine which he declines at this time. -Discussed fall prevention -We elected not to get EKG since he had one last March and sees cardiology regularly.  He has history of atrial  fibrillation but appears to be in sinus rhythm at this time. -Continue regular aerobic exercise with goal of minimum 150 minutes of moderate activity per week. -Continue weight loss efforts  #2 type 2 diabetes improving with A1c today 7.0%.  Continue current dose of extended release metformin and Jardiance.  We still have the option of GLP-1 medication but at this point may not need with his recent success with weight loss and improving numbers. -Set up 639-monthollow-up and recheck urine microalbumin then along with other labs   No  follow-ups on file.    Carolann Littler, MD

## 2022-07-20 ENCOUNTER — Other Ambulatory Visit: Payer: Self-pay | Admitting: Family Medicine

## 2022-07-20 ENCOUNTER — Other Ambulatory Visit (HOSPITAL_BASED_OUTPATIENT_CLINIC_OR_DEPARTMENT_OTHER): Payer: Self-pay

## 2022-07-20 MED FILL — Fluticasone-Salmeterol Aer Powder BA 100-50 MCG/ACT: RESPIRATORY_TRACT | 30 days supply | Qty: 60 | Fill #6 | Status: AC

## 2022-07-21 ENCOUNTER — Other Ambulatory Visit (HOSPITAL_BASED_OUTPATIENT_CLINIC_OR_DEPARTMENT_OTHER): Payer: Self-pay

## 2022-07-21 MED ORDER — ONETOUCH VERIO VI STRP
1.0000 | ORAL_STRIP | Freq: Every day | 0 refills | Status: DC
  Filled 2022-07-21: qty 50, 50d supply, fill #0

## 2022-07-23 ENCOUNTER — Other Ambulatory Visit (HOSPITAL_BASED_OUTPATIENT_CLINIC_OR_DEPARTMENT_OTHER): Payer: Self-pay

## 2022-07-27 ENCOUNTER — Other Ambulatory Visit: Payer: Self-pay | Admitting: Acute Care

## 2022-07-27 DIAGNOSIS — Z87891 Personal history of nicotine dependence: Secondary | ICD-10-CM

## 2022-07-28 ENCOUNTER — Other Ambulatory Visit: Payer: Medicare Other

## 2022-07-31 ENCOUNTER — Other Ambulatory Visit (HOSPITAL_BASED_OUTPATIENT_CLINIC_OR_DEPARTMENT_OTHER): Payer: Self-pay

## 2022-07-31 ENCOUNTER — Other Ambulatory Visit: Payer: Self-pay | Admitting: Family Medicine

## 2022-07-31 ENCOUNTER — Other Ambulatory Visit: Payer: Self-pay

## 2022-07-31 MED ORDER — ALBUTEROL SULFATE HFA 108 (90 BASE) MCG/ACT IN AERS
2.0000 | INHALATION_SPRAY | RESPIRATORY_TRACT | 1 refills | Status: DC | PRN
  Filled 2022-07-31 – 2022-08-03 (×2): qty 6.7, 17d supply, fill #0
  Filled 2023-03-04: qty 6.7, 17d supply, fill #1

## 2022-08-03 ENCOUNTER — Other Ambulatory Visit (HOSPITAL_BASED_OUTPATIENT_CLINIC_OR_DEPARTMENT_OTHER): Payer: Self-pay

## 2022-08-15 ENCOUNTER — Encounter (HOSPITAL_COMMUNITY): Payer: Self-pay | Admitting: Emergency Medicine

## 2022-08-15 ENCOUNTER — Ambulatory Visit (INDEPENDENT_AMBULATORY_CARE_PROVIDER_SITE_OTHER): Payer: Medicare Other

## 2022-08-15 ENCOUNTER — Telehealth (HOSPITAL_COMMUNITY): Payer: Self-pay | Admitting: Emergency Medicine

## 2022-08-15 ENCOUNTER — Ambulatory Visit (HOSPITAL_COMMUNITY)
Admission: EM | Admit: 2022-08-15 | Discharge: 2022-08-15 | Disposition: A | Discharge status: Home / Self Care | Payer: Medicare Other | Attending: Emergency Medicine | Admitting: Emergency Medicine

## 2022-08-15 DIAGNOSIS — M7752 Other enthesopathy of left foot: Secondary | ICD-10-CM | POA: Diagnosis not present

## 2022-08-15 DIAGNOSIS — M25572 Pain in left ankle and joints of left foot: Secondary | ICD-10-CM | POA: Diagnosis not present

## 2022-08-15 DIAGNOSIS — M79605 Pain in left leg: Secondary | ICD-10-CM | POA: Diagnosis not present

## 2022-08-15 MED ORDER — TRAMADOL HCL 50 MG PO TABS: 50.0000 mg | ORAL_TABLET | Freq: Two times a day (BID) | ORAL | 0 refills | Status: DC | PRN

## 2022-08-15 MED ORDER — TRAMADOL HCL 50 MG PO TABS
50.0000 mg | ORAL_TABLET | Freq: Two times a day (BID) | ORAL | 0 refills | Status: DC | PRN
  Filled 2022-08-15: qty 8, 4d supply, fill #0

## 2022-08-15 NOTE — Telephone Encounter (Signed)
Pt requesting medication to be sent to a different pharmacy.

## 2022-08-15 NOTE — Discharge Instructions (Addendum)
Follow RICE instructions as attached Follow-up with PCP Continue to take OTC Tylenol as needed for pain Return or go to ED if you develop any new or worsening of his symptoms

## 2022-08-15 NOTE — ED Triage Notes (Signed)
Pt reports lower left leg pain and swelling. States he woke up this morning a noticed a "knot" near the left ankle. States the area is exteremly painful. Denies any obvious injuries. Took 1000 mg of Tylenol this am with no relief.

## 2022-08-15 NOTE — ED Provider Notes (Addendum)
Eric Palmer   [id number] 08/15/22 Arrival Time: 26  Chief Complaint  Patient presents with   Leg Pain     SUBJECTIVE: History from: patient.   Eric Palmer is a 66 y.o. male presented to the urgent care with a  complaint of left lower leg pain and swelling around the lateral part of the ankle that started this morning.  Denies any precipitating event or injury.  He states the pain is worse with range of motion.  Denies similar symptoms in the past.  Has tried OTC medication with mild relief.  Denies chills, fever, nausea, vomiting diarrhea, and trauma..   ROS: As per HPI.  All other pertinent ROS negative.      Past Medical History:  Diagnosis Date   Allergy    Anal fissure    Chronic bronchitis ([place])    "get it q spring and fall" (10/27/2013)   Coronary artery disease    a. s/p PCI in 1996. b. LHC 01/2006 showed 50% mid LAD, otherwise widely patent cors with minimal irregularities. c. Nuclear stress test in 04/2009 was reportedly low risk, no ischemia. // d. Nuclear stress test 2/19: EF 55, transient ST depression in recovery, no ischemia, inf defect (artifact vs scar)    DM type 2, uncontrolled, with neuropathy    ED (erectile dysfunction)    Essential hypertension    Hx of echocardiogram    Echo (10/2013): Mild LVH, moderate focal basal hypertrophy of the septum, EF 50-55%, no RWMA   Hyperlipidemia    Internal hemorrhoids without mention of complication    Obesity    Obesity    OSA on CPAP    Paresthesia of both legs    Paroxysmal atrial fibrillation ([place])    a. failed Tikosyn, Multaq. b. s/p ablation of AF/AFL in 12/2013.   Paroxysmal atrial flutter ([place])    a. s/p ablation of AF/AFL in 12/2013.   Sleep apnea    Smoker    QUIT 10/13/13   Tubular adenoma of colon 2016   Past Surgical History:  Procedure Laterality Date   ABLATION  01-12-2014   PVI and CTI by Dr [name]   ANAL FISSURE REPAIR  ~ 2010   [place] N/A 01/12/2014    Procedure: ATRIAL FIBRILLATION ABLATION;  Surgeon: Coralyn Mark, MD;  Location: [facility] CATH [facility];  Service: Cardiovascular;  Laterality: N/A;   CARDIAC CATHETERIZATION  2007   CORONARY ANGIOPLASTY WITH STENT PLACEMENT  1996   residual 50% LAD and RCA by cath in 2007   [place] Right 1963   LEFT HEART CATH AND CORONARY ANGIOGRAPHY N/A 10/11/2017   Procedure: LEFT HEART CATH AND CORONARY ANGIOGRAPHY;  Surgeon: [name], MD;  Location: [facility] CV LAB;  Service: Cardiovascular;  Laterality: N/A;   TEE WITHOUT CARDIOVERSION N/A 01/11/2014   Procedure: TRANSESOPHAGEAL ECHOCARDIOGRAM (TEE);  Surgeon: [name], MD;  Location: [facility];  Service: Cardiovascular;  Laterality: N/A;   TONSILLECTOMY AND ADENOIDECTOMY  1979   Allergies  Allergen Reactions   Cephalosporins Anaphylaxis   Penicillins Anaphylaxis and Other (See Comments)    Has patient had a PCN reaction causing immediate rash, facial/tongue/throat swelling, SOB or lightheadedness with hypotension: Yes Has patient had a PCN reaction causing severe rash involving mucus membranes or skin necrosis: No Has patient had a PCN reaction that required hospitalization: No - went to doctors office Has patient had a PCN reaction occurring  within the last 10 years: No If all of the above answers are "NO", then may proceed with Cephalosporin use.    Thallium Itching and Rash   Codeine Itching   Gabapentin Other (See Comments)    "made me feel drugged"   Eliquis [Apixaban] Diarrhea   No current facility-administered medications on file prior to encounter.   Current Outpatient Medications on File Prior to Encounter  Medication Sig Dispense Refill   albuterol (VENTOLIN HFA) 108 (90 Base) MCG/ACT inhaler Inhale 2 puffs into the lungs every 4-6 hours as needed for cough/wheeze. 6.7 g 1   atorvastatin (LIPITOR) 40 MG tablet Take 1 tablet by mouth daily at 6 pm 90 tablet 3    azelastine (ASTELIN) 0.1 % nasal spray Place 2 sprays into both nostrils 2 (two) times daily. Use in each nostril as directed 30 mL 11   diltiazem (CARDIZEM CD) 360 MG 24 hr capsule TAKE 1 CAPSULE(360 MG) BY MOUTH DAILY 90 capsule 2   empagliflozin (JARDIANCE) 25 MG TABS tablet Take 1 tablet (25 mg total) by mouth daily. 90 tablet 1   fluticasone-salmeterol (ADVAIR DISKUS) 100-50 MCG/ACT AEPB Inhale 1 puff into the lungs twice daily 60 each 11   glucose blood (ONETOUCH VERIO) test strip Test blood sugar once daily 100 each 0   icosapent Ethyl (VASCEPA) 1 g capsule Take 2 capsules (2 g total) by mouth 2 (two) times daily. 360 capsule 3   metFORMIN (GLUCOPHAGE-XR) 750 MG 24 hr tablet Take 1 tablet (750 mg) by mouth twice daily. 180 tablet 1   nitroGLYCERIN (NITROSTAT) 0.4 MG SL tablet DISSOLVE 1 TABLET UNDER THE TONGUE EVERY 5 MINUTES FOR 3 DOSES AS NEEDED FOR CHEST PAIN(CALL 911 IF 3RD DOSE IS TAKEN) 25 tablet 7   NONFORMULARY OR COMPOUNDED ITEM Pain cream : ketamine 5%, baclofen 2%, gabapentin 5%, lidocaine 5%, menthol 1% Order faxed to [facility] USE TO CHECK BLOOD SUGAR ONCE DAILY 100 each 0   rivaroxaban (XARELTO) 20 MG TABS tablet Take 1 tablet (20 mg total) by mouth daily with supper. 90 tablet 1   sotalol (BETAPACE) 80 MG tablet Take 1 tablet (80 mg total) by mouth 2 (two) times daily. 180 tablet 1   tadalafil (CIALIS) 20 MG tablet Take 1 tablet (20 mg total) by mouth daily as needed for erectile dysfunction. 10 tablet 3   Social History   Socioeconomic History   Marital status: Married    Spouse name: Not on file   Number of children: Not on file   Years of education: Not on file   Highest education level: Not on file  Occupational History   Not on file  Tobacco Use   Smoking status: Former    Packs/day: 1.00    Years: 43.00    Total pack years: 43.00    Types: Cigarettes    Quit date: 2020    Years since quitting: 3.9   Smokeless  tobacco: Never  Vaping Use   Vaping Use: Never used  Substance and Sexual Activity   Alcohol use: Yes    Alcohol/week: 0.0 standard drinks of alcohol    Comment: 10/27/2013 "might have 1 beer/month"   Drug use: No   Sexual activity: Yes  Other Topics Concern   Not on file  Social History Narrative   Works as an Programme researcher, broadcasting/film/video for [employer] Strain: Low Risk  (04/02/2022)   Overall  Financial Resource Strain (CARDIA)    Difficulty of Paying Living Expenses: Not hard at all  Food Insecurity: No Food Insecurity (04/02/2022)   Hunger Vital Sign    Worried About Running Out of Food in the Last Year: Never true    Ran Out of Food in the Last Year: Never true  Transportation Needs: No Transportation Needs (04/02/2022)   PRAPARE - Hydrologist (Medical): No    Lack of Transportation (Non-Medical): No  Physical Activity: Sufficiently Active (04/02/2022)   Exercise Vital Sign    Days of Exercise per Week: 3 days    Minutes of Exercise per Session: 60 min  Stress: No Stress Concern Present (04/02/2022)   [place]    Feeling of Stress : Not at all  Social Connections: Unknown (04/02/2022)   Social Connection and Isolation Panel [NHANES]    Frequency of Communication with Friends and Family: More than three times a week    Frequency of Social Gatherings with Friends and Family: More than three times a week    Attends Religious Services: Patient refused    Marine scientist or Organizations: No    Attends Music therapist: Not on file    Marital Status: Married  Human resources officer Violence: Not on file   Family History  Problem Relation Age of Onset   Diabetes Mother    Heart disease Mother 59       Arrhythmia   Hypertension Mother    Heart disease Father 54       died 53 CHF   Heart attack Father    Heart disease Brother     Hyperlipidemia Other    Heart disease Other    Stroke Neg Hx    Colon cancer Neg Hx    Esophageal cancer Neg Hx    Liver disease Neg Hx    Pancreatic cancer Neg Hx    Stomach cancer Neg Hx     OBJECTIVE:  Vitals:   08/15/22 1222  BP: 134/78  Pulse: 71  Resp: 18  Temp: 97.6 F (36.4 C)  TempSrc: Oral  SpO2: 94%     Physical Exam Vitals and nursing note reviewed.  Constitutional:      General: He is not in acute distress.    Appearance: Normal appearance. He is normal weight. He is not ill-appearing, toxic-appearing or diaphoretic.  Cardiovascular:     Rate and Rhythm: Normal rate and regular rhythm.     Pulses: Normal pulses.     Heart sounds: Normal heart sounds. No murmur heard.    No friction rub. No gallop.  Pulmonary:     Effort: Pulmonary effort is normal. No respiratory distress.     Breath sounds: Normal breath sounds. No stridor. No wheezing, rhonchi or rales.  Chest:     Chest wall: No tenderness.  Musculoskeletal:     Right ankle: Normal.     Left ankle: Swelling present. Tenderness present.     Comments:  the left ankle is without any obvious deformity when compared to the right ankle.  Swelling is present on the lateral malleolus area of the left ankle.  Tenderness on range of motion.  Neurovascular status intact.  Neurological:     Mental Status: He is alert and oriented to person, place, and time.      LABS:  No results found for this or any previous visit (from the past 24 hour(s)).  RADIOLOGY  DG Ankle Complete Left  Result Date: 08/15/2022 CLINICAL DATA:  Ankle pain. Patient reports lower left leg pain and swelling. Patient states he woke up this morning and noticed a knot near the left ankle. Patient denies any recent injury. EXAM: LEFT ANKLE COMPLETE - 3+ VIEW COMPARISON:  None Available. FINDINGS: There is no evidence of acute fracture, dislocation, or joint effusion. Tiny ossification just inferior to the medial malleolus is favored to  represent sequela of prior injury. Mild soft tissue swelling is noted over the lateral aspect of the ankle and medial aspect of the visualized distal left lower leg. A few tiny calcifications are noted in the subcutaneous soft tissue just lateral to the distal fibular shaft, seen on AP and oblique views only, favored to represent dystrophic soft tissue calcifications. IMPRESSION: 1. No acute osseous abnormality of the left ankle. 2. Soft tissue swelling noted at the lateral aspect of the ankle and within the medial soft tissues of the visualized portions of the distal left lower leg. Electronically Signed   By: Ileana Roup M.D.   On: 08/15/2022 13:41     The left ankle X-ray is negative acute fracture or dislocation.  I have reviewed the x-ray myself and the radiologist interpretation.  I am in agreement with the radiologist interpretation.   ASSESSMENT & PLAN:  1. Left ankle tendinitis     No orders of the defined types were placed in this encounter.   Discharge instructions  Follow RICE instructions as attached Follow-up with PCP Tramadol as prescribed for severe pain Continue to take OTC Tylenol as needed for pain Return or go to ED if you develop any new or worsening of his symptoms  Reviewed expectations re: course of current medical issues. Questions answered. Outlined signs and symptoms indicating need for more acute intervention. Patient verbalized understanding. After Visit Summary given.   PDMP reviewed during this encounter.          Emerson Monte, FNP 08/15/22 1352    Emerson Monte, FNP 08/15/22 1357

## 2022-08-17 ENCOUNTER — Other Ambulatory Visit (HOSPITAL_BASED_OUTPATIENT_CLINIC_OR_DEPARTMENT_OTHER): Payer: Self-pay

## 2022-08-18 ENCOUNTER — Ambulatory Visit (INDEPENDENT_AMBULATORY_CARE_PROVIDER_SITE_OTHER): Payer: Medicare Other | Admitting: Family Medicine

## 2022-08-18 ENCOUNTER — Encounter: Payer: Self-pay | Admitting: Family Medicine

## 2022-08-18 ENCOUNTER — Telehealth: Payer: Self-pay

## 2022-08-18 ENCOUNTER — Other Ambulatory Visit (HOSPITAL_BASED_OUTPATIENT_CLINIC_OR_DEPARTMENT_OTHER): Payer: Self-pay

## 2022-08-18 ENCOUNTER — Ambulatory Visit (INDEPENDENT_AMBULATORY_CARE_PROVIDER_SITE_OTHER): Payer: Medicare Other

## 2022-08-18 VITALS — BP 118/62 | HR 75 | Temp 97.9°F | Ht 73.0 in | Wt 232.7 lb

## 2022-08-18 DIAGNOSIS — M79605 Pain in left leg: Secondary | ICD-10-CM

## 2022-08-18 DIAGNOSIS — I879 Disorder of vein, unspecified: Secondary | ICD-10-CM | POA: Diagnosis not present

## 2022-08-18 MED ORDER — TRAMADOL HCL 50 MG PO TABS: 50.0000 mg | ORAL_TABLET | Freq: Four times a day (QID) | ORAL | 0 refills | Status: DC | PRN

## 2022-08-18 MED ORDER — TRAMADOL HCL 50 MG PO TABS
50.0000 mg | ORAL_TABLET | Freq: Four times a day (QID) | ORAL | 0 refills | Status: AC | PRN
  Filled 2022-08-18: qty 20, 5d supply, fill #0

## 2022-08-18 NOTE — Telephone Encounter (Signed)
No DVT, does have a piece of calcium adjacent to the area of pain, not sure if that is causing his pain.

## 2022-08-18 NOTE — Progress Notes (Signed)
Established Patient Office Visit  Subjective   Patient ID: Eric Palmer, male    DOB: 1956/03/02  Age: 66 y.o. MRN: [id number]  Chief Complaint  Patient presents with   Leg Pain    Patient complains of leg pain, X3 days, Patient denis any known injury, Tried Tramadol with little relief    Edema    Patient complains of lower left swelling, x3 days     HPI   Eric Palmer is seen with left mostly lateral leg and ankle pain for past 3 days.  Denies any injury.  Onset was around Friday.  He woke up with some pain.  Does not recall any sort of ankle injury.  He noted a bit of swelling locally-lateral leg.  He has some chronic venous changes with varicose veins left lower extremity greater than right.  He feels like his entire left lower extremity is somewhat more swollen than usual.  He has history of atrial fibrillation on chronic blood thinner with Xarelto.  His other medical problems include history of hypertension, obstructive sleep apnea, COPD, type 2 diabetes, chronic diabetic peripheral neuropathy.  Denies any recent claudication type symptoms.  No history of gout.  He went to urgent care this past Saturday.  X-ray was done which showed no acute bony abnormalities.  He was given limited tramadol which has helped.  Cannot take nonsteroidals because of his Xarelto  Past Medical History:  Diagnosis Date   Allergy    Anal fissure    Chronic bronchitis ([place])    "get it q spring and fall" (10/27/2013)   Coronary artery disease    a. s/p PCI in 1996. b. LHC 01/2006 showed 50% mid LAD, otherwise widely patent cors with minimal irregularities. c. Nuclear stress test in 04/2009 was reportedly low risk, no ischemia. // d. Nuclear stress test 2/19: EF 55, transient ST depression in recovery, no ischemia, inf defect (artifact vs scar)    DM type 2, uncontrolled, with neuropathy    ED (erectile dysfunction)    Essential hypertension    Hx of echocardiogram    Echo (10/2013): Mild LVH, moderate focal  basal hypertrophy of the septum, EF 50-55%, no RWMA   Hyperlipidemia    Internal hemorrhoids without mention of complication    Obesity    Obesity    OSA on CPAP    Paresthesia of both legs    Paroxysmal atrial fibrillation ([place])    a. failed Tikosyn, Multaq. b. s/p ablation of AF/AFL in 12/2013.   Paroxysmal atrial flutter ([place])    a. s/p ablation of AF/AFL in 12/2013.   Sleep apnea    Smoker    QUIT 10/13/13   Tubular adenoma of colon 2016   Past Surgical History:  Procedure Laterality Date   ABLATION  01-12-2014   PVI and CTI by Dr [name]   ANAL FISSURE REPAIR  ~ 2010   [place] N/A 01/12/2014   Procedure: ATRIAL FIBRILLATION ABLATION;  Surgeon: Coralyn Mark, MD;  Location: [facility] CATH LAB;  Service: Cardiovascular;  Laterality: N/A;   CARDIAC CATHETERIZATION  2007   CORONARY ANGIOPLASTY WITH STENT PLACEMENT  1996   residual 50% LAD and RCA by cath in 2007   [place] Right 1963   LEFT HEART CATH AND CORONARY ANGIOGRAPHY N/A 10/11/2017   Procedure: LEFT HEART CATH AND CORONARY ANGIOGRAPHY;  Surgeon: [name], MD;  Location: [facility] CV LAB;  Service:  Cardiovascular;  Laterality: N/A;   TEE WITHOUT CARDIOVERSION N/A 01/11/2014   Procedure: TRANSESOPHAGEAL ECHOCARDIOGRAM (TEE);  Surgeon: [name], MD;  Location: [facility];  Service: Cardiovascular;  Laterality: N/A;   [place]    reports that he quit smoking about 3 years ago. His smoking use included cigarettes. He has a 43.00 pack-year smoking history. He has never used smokeless tobacco. He reports current alcohol use. He reports that he does not use drugs. family history includes Diabetes in his mother; Heart attack in his father; Heart disease in his brother and another family member; Heart disease (age of onset: 29) in his father; Heart disease (age of onset: 52) in his mother; Hyperlipidemia in an other  family member; Hypertension in his mother. Allergies  Allergen Reactions   Cephalosporins Anaphylaxis   Penicillins Anaphylaxis and Other (See Comments)    Has patient had a PCN reaction causing immediate rash, facial/tongue/throat swelling, SOB or lightheadedness with hypotension: Yes Has patient had a PCN reaction causing severe rash involving mucus membranes or skin necrosis: No Has patient had a PCN reaction that required hospitalization: No - went to doctors office Has patient had a PCN reaction occurring within the last 10 years: No If all of the above answers are "NO", then may proceed with Cephalosporin use.    Thallium Itching and Rash   Codeine Itching   Gabapentin Other (See Comments)    "made me feel drugged"   Eliquis [Apixaban] Diarrhea    Review of Systems  Constitutional:  Negative for chills and fever.  Respiratory:  Negative for cough, hemoptysis and shortness of breath.   Cardiovascular:  Negative for chest pain.      Objective:     BP 118/62 (BP Location: Left Arm, Patient Position: Sitting, Cuff Size: Normal)   Pulse 75   Temp 97.9 F (36.6 C) (Oral)   Ht '6\' 1"'$  (1.854 m)   Wt 232 lb 11.2 oz (105.6 kg)   SpO2 96%   BMI 30.70 kg/m    Physical Exam Vitals reviewed.  Constitutional:      Appearance: Normal appearance.  Cardiovascular:     Rate and Rhythm: Normal rate and regular rhythm.  Pulmonary:     Effort: Pulmonary effort is normal.     Breath sounds: Normal breath sounds.  Musculoskeletal:     Comments: Localized swelling left lower extremity laterally about 5 to 6 cm above the lateral malleolus.  He does have multiple varicosities left lower extremity.  Left lower extremity is notably increased in circumference over the right.  No pitting edema.  Ankle is full range of motion with no localized bony tenderness.  No Achilles tenderness.  Full range of motion with dorsiflexion plantarflexion.  He has significant hyperpigmentation both feet  consistent with venous stasis but good capillary refill both feet are warm to touch.  Neurological:     Mental Status: He is alert.      No results found for any visits on 08/18/22.    The ASCVD Risk score (Arnett DK, et al., 2019) failed to calculate for the following reasons:   The valid total cholesterol range is 130 to 320 mg/dL    Assessment & Plan:   Left lateral leg pain.  He does have increased size left lower extremity compared to the right and known chronic varicosities left lower extremity greater than right.  Likelihood of DVT would seem unlikely especially given location of pain and the fact he is on  Xarelto.  He is noticing though significant increase in left lower extremity compared to the right over the past few days acutely in the absence of injury  -Set up venous Doppler left lower extremity -Avoid nonsteroidals with his chronic Xarelto -Refill tramadol for as needed use #20 every 6 hours as needed for severe pain -Consider sports medicine referral if pain persist   No follow-ups on file.    Carolann Littler, MD

## 2022-08-18 NOTE — Telephone Encounter (Signed)
Noted  Betzalel Umbarger W Kash Davie MD [facility]  

## 2022-08-20 ENCOUNTER — Encounter: Payer: Self-pay | Admitting: Family Medicine

## 2022-08-20 DIAGNOSIS — M79605 Pain in left leg: Secondary | ICD-10-CM

## 2022-08-21 ENCOUNTER — Other Ambulatory Visit (HOSPITAL_BASED_OUTPATIENT_CLINIC_OR_DEPARTMENT_OTHER): Payer: Self-pay

## 2022-08-21 ENCOUNTER — Other Ambulatory Visit: Payer: Self-pay

## 2022-08-21 MED ORDER — CLINDAMYCIN HCL 300 MG PO CAPS
300.0000 mg | ORAL_CAPSULE | Freq: Four times a day (QID) | ORAL | 0 refills | Status: DC
  Filled 2022-08-21: qty 28, 7d supply, fill #0

## 2022-08-21 NOTE — Addendum Note (Signed)
Addended by: Rodrigo Ran on: 08/21/2022 08:48 AM   Modules accepted: Orders

## 2022-08-22 ENCOUNTER — Other Ambulatory Visit (HOSPITAL_BASED_OUTPATIENT_CLINIC_OR_DEPARTMENT_OTHER): Payer: Self-pay

## 2022-08-25 ENCOUNTER — Ambulatory Visit
Admission: RE | Admit: 2022-08-25 | Discharge: 2022-08-25 | Disposition: A | Discharge status: Home / Self Care | Payer: BLUE CROSS/BLUE SHIELD | Source: Ambulatory Visit | Attending: Acute Care | Admitting: Acute Care

## 2022-08-25 DIAGNOSIS — Z87891 Personal history of nicotine dependence: Secondary | ICD-10-CM

## 2022-08-26 ENCOUNTER — Other Ambulatory Visit (HOSPITAL_BASED_OUTPATIENT_CLINIC_OR_DEPARTMENT_OTHER): Payer: Self-pay

## 2022-08-26 ENCOUNTER — Ambulatory Visit: Payer: BLUE CROSS/BLUE SHIELD | Admitting: Family Medicine

## 2022-08-26 ENCOUNTER — Ambulatory Visit: Payer: Self-pay

## 2022-08-26 VITALS — BP 118/62 | HR 75 | Ht 73.0 in | Wt 232.0 lb

## 2022-08-26 DIAGNOSIS — M25572 Pain in left ankle and joints of left foot: Secondary | ICD-10-CM | POA: Diagnosis not present

## 2022-08-26 DIAGNOSIS — G8929 Other chronic pain: Secondary | ICD-10-CM | POA: Diagnosis not present

## 2022-08-26 DIAGNOSIS — M7672 Peroneal tendinitis, left leg: Secondary | ICD-10-CM

## 2022-08-26 MED ORDER — CLINDAMYCIN HCL 300 MG PO CAPS
300.0000 mg | ORAL_CAPSULE | Freq: Four times a day (QID) | ORAL | 0 refills | Status: AC
  Filled 2022-08-26: qty 28, 7d supply, fill #0

## 2022-08-26 NOTE — Progress Notes (Signed)
I, Eric Palmer, LAT, ATC acting as a scribe for Eric Leader, MD.  Subjective:    CC: L ankle pain  HPI: Pt is a 67 y/o male c/o L ankle pain ongoing since just before Christmas. No MOI. Pt locates pain to the lateral aspect of his left ankle.  He developed pain and swelling just before Christmas.  He was seen in urgent care and a vascular ultrasound was arranged performed on December 26.  He did not have a blood clot at that time but did have some calcification seen just superficial to the peroneal tendons at that lateral ankle near the location of pain.  He was subsequently seen by his primary care provider on December 26 and thought to have cellulitis and treated with clindamycin which worked remarkably well.  The clindamycin will end in 3 days on Friday the fifth.  Additionally he is taking some tramadol for pain control which helps quite a bit as well.  No fevers or chills.   Dx testing: 08/18/22 L LE vasc US 08/15/22 L ankle XR  08/13/21 L knee XR  Pertinent review of Systems: No fevers or chills.  Relevant historical information: Positive for varicose veins.  Takes Xarelto for atrial fibrillation.  Positive for diabetes.   Objective:    General: Well Developed, well nourished, and in no acute distress.   MSK: Left ankle and lower leg: Varicose veins are present.  Some mild edema present lower leg.  Tender palpation lateral ankle.  Small nodule present at area of tenderness at the lateral ankle.  Normal ankle motion and strength are intact.  Pulses cap refill and sensation are intact distally.  Lab and Radiology Results  Diagnostic Limited MSK Ultrasound of: Left ankle lateral Hypoechoic fluid tracking and subcutaneous soft tissue consistent with edema or cellulitis. Calcifications present superficial to peroneal tendon at lateral ankle consistent to appearance on x-ray.  Peroneal tendons themselves are normal-appearing Impression: Subcutaneous edema and calcification  superficial to peroneal tendon.   EXAM: LEFT ANKLE COMPLETE - 3+ VIEW   COMPARISON:  None Available.   FINDINGS: There is no evidence of acute fracture, dislocation, or joint effusion. Tiny ossification just inferior to the medial malleolus is favored to represent sequela of prior injury. Mild soft tissue swelling is noted over the lateral aspect of the ankle and medial aspect of the visualized distal left lower leg. A few tiny calcifications are noted in the subcutaneous soft tissue just lateral to the distal fibular shaft, seen on AP and oblique views only, favored to represent dystrophic soft tissue calcifications.   IMPRESSION: 1. No acute osseous abnormality of the left ankle. 2. Soft tissue swelling noted at the lateral aspect of the ankle and within the medial soft tissues of the visualized portions of the distal left lower leg.     Electronically Signed   By: Ileana Roup M.D.   On: 08/15/2022 13:41 I, Eric Palmer, personally (independently) visualized and performed the interpretation of the images attached in this note.   Impression and Recommendations:    Assessment and Plan: 67 y.o. male with left ankle pain.  Patient had pain and swelling around 25 December which responded remarkably well to clindamycin.  This would indicate that a lot of his pain was due to cellulitis.  Clindamycin is scheduled to end on Friday, 5 January.  I am worried that his symptoms may return over the weekend and have refilled his clindamycin that he can start taking again if his pain should  return over the weekend.  In the meantime he does appear to have a calcific calcification superficial to the peroneal tendon that could be causing some pain as well.  Will treat with physical therapy and compression and Voltaren gel.  Plan to check back in 1 month.[name]  PDMP not reviewed this encounter. Orders Placed This Encounter  Procedures   [place] LIMITED JOINT SPACE STRUCTURES LOW LEFT(NO LINKED CHARGES)     Order Specific Question:   Reason for Exam (SYMPTOM  OR DIAGNOSIS REQUIRED)    Answer:   left ankle pain    Order Specific Question:   Preferred imaging location?    Answer:   [facility]   Ambulatory referral to Physical Therapy    Referral Priority:   Routine    Referral Type:   Physical Medicine    Referral Reason:   Specialty Services Required    Requested Specialty:   Physical Therapy    Number of Visits Requested:   1   Meds ordered this encounter  Medications   clindamycin (CLEOCIN) 300 MG capsule    Sig: Take 1 capsule (300 mg total) by mouth every 6 (six) hours for 7 days.    Dispense:  28 capsule    Refill:  0    Discussed warning signs or symptoms. Please see discharge instructions. Patient expresses understanding.   The above documentation has been reviewed and is accurate and complete Eric Palmer, M.D.

## 2022-08-26 NOTE — Patient Instructions (Signed)
Thank you for coming in today.   Please use Voltaren gel (Generic Diclofenac Gel) up to 4x daily for pain as needed.  This is available over-the-counter as both the name brand Voltaren gel and the generic diclofenac gel.   Use compression stocking.   Recheck in 1 month.   Attend PT.   Let me know if the [facility] location cannot get to you in a timely manner.   You have the backup clindamycin antibiotic to take if this goes bad when you stop this weekend.

## 2022-08-27 ENCOUNTER — Other Ambulatory Visit: Payer: Self-pay | Admitting: Acute Care

## 2022-08-27 DIAGNOSIS — Z122 Encounter for screening for malignant neoplasm of respiratory organs: Secondary | ICD-10-CM

## 2022-08-27 DIAGNOSIS — Z87891 Personal history of nicotine dependence: Secondary | ICD-10-CM

## 2022-09-14 ENCOUNTER — Other Ambulatory Visit (HOSPITAL_BASED_OUTPATIENT_CLINIC_OR_DEPARTMENT_OTHER): Payer: Self-pay

## 2022-09-16 ENCOUNTER — Other Ambulatory Visit (HOSPITAL_BASED_OUTPATIENT_CLINIC_OR_DEPARTMENT_OTHER): Payer: Self-pay

## 2022-09-16 ENCOUNTER — Other Ambulatory Visit: Payer: Self-pay | Admitting: Family Medicine

## 2022-09-16 ENCOUNTER — Other Ambulatory Visit: Payer: Self-pay

## 2022-09-16 MED ORDER — FLUTICASONE-SALMETEROL 100-50 MCG/ACT IN AEPB
1.0000 | INHALATION_SPRAY | Freq: Two times a day (BID) | RESPIRATORY_TRACT | 11 refills | Status: DC
  Filled 2022-09-16: qty 60, 30d supply, fill #0
  Filled 2022-10-12: qty 60, 30d supply, fill #1
  Filled 2022-11-11 – 2022-11-19 (×2): qty 60, 30d supply, fill #2
  Filled 2023-06-19: qty 60, 30d supply, fill #3
  Filled 2023-07-16: qty 60, 30d supply, fill #4
  Filled 2023-08-17: qty 60, 30d supply, fill #5

## 2022-09-17 ENCOUNTER — Other Ambulatory Visit (HOSPITAL_BASED_OUTPATIENT_CLINIC_OR_DEPARTMENT_OTHER): Payer: Self-pay

## 2022-09-22 ENCOUNTER — Encounter: Payer: Self-pay | Admitting: Cardiology

## 2022-09-22 ENCOUNTER — Ambulatory Visit: Payer: Medicare Other | Attending: Cardiology | Admitting: Cardiology

## 2022-09-22 ENCOUNTER — Telehealth: Payer: Self-pay | Admitting: *Deleted

## 2022-09-22 VITALS — BP 114/70 | HR 69 | Ht 73.0 in | Wt 230.6 lb

## 2022-09-22 DIAGNOSIS — I10 Essential (primary) hypertension: Secondary | ICD-10-CM

## 2022-09-22 DIAGNOSIS — G4733 Obstructive sleep apnea (adult) (pediatric): Secondary | ICD-10-CM | POA: Diagnosis not present

## 2022-09-22 NOTE — Telephone Encounter (Signed)
Pt was seen in the office today by Dr. Radford Pax who ordered the pt an [facility] sleep study. Pt has been given app to put on his phone as his phone was in his car. Pt is agreeable to signed waiver and not open the box until he has been called with the PIN#.

## 2022-09-22 NOTE — Addendum Note (Signed)
Addended by: Bernestine Amass on: 09/22/2022 11:30 AM   Modules accepted: Orders

## 2022-09-22 NOTE — Progress Notes (Signed)
Cardiology Note    Date:  09/22/2022   ID:  Eric Palmer, DOB Feb 07, 1956, MRN [id number]  PCP:  [name], MD  Cardiologist:  Fransico Him, MD   Chief Complaint  Patient presents with   Hypertension   Sleep Apnea     History of Present Illness:  Eric Palmer is a 67 y.o. male  with a hx of PAF/flutter, HTN, OSA, DM 2 with peripheral neuropathy, HLD, ASCAD s/p PCI in 8144 and metabolic syndrome.  He is on chronic anticoagulation with Xarelto.    He essentially has stopped using his device since the end of October.  He lost 35lb of weight and has felt that the pressure on the PAP device is now too much. He exercises 2-3 times weekly.  He says that he does not think that he is snoring.  He feels rested when he wakes up but still gets sleepy some after lunch.   Past Medical History:  Diagnosis Date   Allergy    Anal fissure    Chronic bronchitis ([place])    "get it q spring and fall" (10/27/2013)   Coronary artery disease    a. s/p PCI in 1996. b. LHC 01/2006 showed 50% mid LAD, otherwise widely patent cors with minimal irregularities. c. Nuclear stress test in 04/2009 was reportedly low risk, no ischemia. // d. Nuclear stress test 2/19: EF 55, transient ST depression in recovery, no ischemia, inf defect (artifact vs scar)    DM type 2, uncontrolled, with neuropathy    ED (erectile dysfunction)    Essential hypertension    Hx of echocardiogram    Echo (10/2013): Mild LVH, moderate focal basal hypertrophy of the septum, EF 50-55%, no RWMA   Hyperlipidemia    Internal hemorrhoids without mention of complication    Obesity    Obesity    OSA on CPAP    Paresthesia of both legs    Paroxysmal atrial fibrillation ([place])    a. failed Tikosyn, Multaq. b. s/p ablation of AF/AFL in 12/2013.   Paroxysmal atrial flutter ([place])    a. s/p ablation of AF/AFL in 12/2013.   Sleep apnea    Smoker    QUIT 10/13/13   Tubular adenoma of colon 2016    Past Surgical History:  Procedure  Laterality Date   ABLATION  01-12-2014   PVI and CTI by Dr [name]   ANAL FISSURE REPAIR  ~ 2010   [place] N/A 01/12/2014   Procedure: ATRIAL FIBRILLATION ABLATION;  Surgeon: Coralyn Mark, MD;  Location: [facility] CATH LAB;  Service: Cardiovascular;  Laterality: N/A;   CARDIAC CATHETERIZATION  2007   CORONARY ANGIOPLASTY WITH STENT PLACEMENT  1996   residual 50% LAD and RCA by cath in 2007   [place] Right 1963   LEFT HEART CATH AND CORONARY ANGIOGRAPHY N/A 10/11/2017   Procedure: LEFT HEART CATH AND CORONARY ANGIOGRAPHY;  Surgeon: [name], MD;  Location: [facility] CV LAB;  Service: Cardiovascular;  Laterality: N/A;   TEE WITHOUT CARDIOVERSION N/A 01/11/2014   Procedure: TRANSESOPHAGEAL ECHOCARDIOGRAM (TEE);  Surgeon: [name], MD;  Location: [facility] ENDOSCOPY;  Service: Cardiovascular;  Laterality: N/A;   TONSILLECTOMY AND ADENOIDECTOMY  1979    Current Medications: Current Meds  Medication Sig   albuterol (VENTOLIN HFA) 108 (90 Base) MCG/ACT inhaler Inhale 2 puffs into the lungs every 4-6 hours as needed for cough/wheeze.  atorvastatin (LIPITOR) 40 MG tablet Take 1 tablet by mouth daily at 6 pm   azelastine (ASTELIN) 0.1 % nasal spray Place 2 sprays into both nostrils 2 (two) times daily. Use in each nostril as directed   diltiazem (CARDIZEM CD) 360 MG 24 hr capsule TAKE 1 CAPSULE(360 MG) BY MOUTH DAILY   empagliflozin (JARDIANCE) 25 MG TABS tablet Take 1 tablet (25 mg total) by mouth daily.   fluticasone-salmeterol (ADVAIR DISKUS) 100-50 MCG/ACT AEPB Inhale 1 puff into the lungs 2 (two) times daily.   glucose blood (ONETOUCH VERIO) test strip Test blood sugar once daily   icosapent Ethyl (VASCEPA) 1 g capsule Take 2 capsules (2 g total) by mouth 2 (two) times daily.   metFORMIN (GLUCOPHAGE-XR) 750 MG 24 hr tablet Take 1 tablet (750 mg) by mouth twice daily.   nitroGLYCERIN (NITROSTAT) 0.4 MG SL  tablet DISSOLVE 1 TABLET UNDER THE TONGUE EVERY 5 MINUTES FOR 3 DOSES AS NEEDED FOR CHEST PAIN(CALL 911 IF 3RD DOSE IS TAKEN)   ONETOUCH DELICA LANCETS FINE MISC USE TO CHECK BLOOD SUGAR ONCE DAILY   rivaroxaban (XARELTO) 20 MG TABS tablet Take 1 tablet (20 mg total) by mouth daily with supper.   sotalol (BETAPACE) 80 MG tablet Take 1 tablet (80 mg total) by mouth 2 (two) times daily.   tadalafil (CIALIS) 20 MG tablet Take 1 tablet (20 mg total) by mouth daily as needed for erectile dysfunction.    Allergies:   Cephalosporins, Penicillins, Thallium, Codeine, Gabapentin, and Eliquis [apixaban]   Social History   Socioeconomic History   Marital status: Married    Spouse name: Not on file   Number of children: Not on file   Years of education: Not on file   Highest education level: Not on file  Occupational History   Not on file  Tobacco Use   Smoking status: Former    Packs/day: 1.00    Years: 43.00    Total pack years: 43.00    Types: Cigarettes    Quit date: 2020    Years since quitting: 4.0   Smokeless tobacco: Never  Vaping Use   Vaping Use: Never used  Substance and Sexual Activity   Alcohol use: Yes    Alcohol/week: 0.0 standard drinks of alcohol    Comment: 10/27/2013 "might have 1 beer/month"   Drug use: No   Sexual activity: Yes  Other Topics Concern   Not on file  Social History Narrative   Works as an Programme researcher, broadcasting/film/video for [employer] Strain: Low Risk  (04/02/2022)   Overall Financial Resource Strain (CARDIA)    Difficulty of Paying Living Expenses: Not hard at all  Food Insecurity: No Food Insecurity (04/02/2022)   Hunger Vital Sign    Worried About Running Out of Food in the Last Year: Never true    Ran Out of Food in the Last Year: Never true  Transportation Needs: No Transportation Needs (04/02/2022)   PRAPARE - Hydrologist (Medical): No    Lack of Transportation (Non-Medical): No   Physical Activity: Sufficiently Active (04/02/2022)   Exercise Vital Sign    Days of Exercise per Week: 3 days    Minutes of Exercise per Session: 60 min  Stress: No Stress Concern Present (04/02/2022)   [place]    Feeling of Stress : Not at all  Social Connections: Unknown (04/02/2022)   Social Connection  and Isolation Panel [NHANES]    Frequency of Communication with Friends and Family: More than three times a week    Frequency of Social Gatherings with Friends and Family: More than three times a week    Attends Religious Services: Patient refused    Marine scientist or Organizations: No    Attends Music therapist: Not on file    Marital Status: Married     Family History:  The patient's family history includes Diabetes in his mother; Heart attack in his father; Heart disease in his brother and another family member; Heart disease (age of onset: 96) in his father; Heart disease (age of onset: 61) in his mother; Hyperlipidemia in an other family member; Hypertension in his mother.   ROS:   Please see the history of present illness.    ROS All other systems reviewed and are negative.      No data to display             PHYSICAL EXAM:   VS:  BP 114/70   Pulse 69   Ht '6\' 1"'$  (1.854 m)   Wt 230 lb 9.6 oz (104.6 kg)   SpO2 97%   BMI 30.42 kg/m    GEN: Well nourished, well developed in no acute distress HEENT: Normal NECK: No JVD; No carotid bruits LYMPHATICS: No lymphadenopathy CARDIAC:RRR, no murmurs, rubs, gallops RESPIRATORY:  Clear to auscultation without rales, wheezing or rhonchi  ABDOMEN: Soft, non-tender, non-distended MUSCULOSKELETAL:  No edema; No deformity  SKIN: Warm and dry NEUROLOGIC:  Alert and oriented x 3 PSYCHIATRIC:  Normal affect  Wt Readings from Last 3 Encounters:  09/22/22 230 lb 9.6 oz (104.6 kg)  08/26/22 232 lb (105.2 kg)  08/18/22 232 lb 11.2 oz (105.6  kg)      Studies/Labs Reviewed:   EKG:  EKG is not ordered today.    Recent Labs: 12/31/2021: ALT 26; BUN 13; Creatinine, Ser 0.96; Hemoglobin 16.2; Platelets 244.0; Potassium 4.2; Sodium 139; TSH 1.40   Lipid Panel    Component Value Date/Time   CHOL 112 12/31/2021 1111   CHOL 130 06/29/2019 0724   TRIG 235.0 (H) 12/31/2021 1111   HDL 37.10 (L) 12/31/2021 1111   HDL 35 (L) 06/29/2019 0724   CHOLHDL 3 12/31/2021 1111   VLDL 47.0 (H) 12/31/2021 1111   LDLCALC  07/01/2020 1411     Comment:     . LDL cholesterol not calculated. Triglyceride levels greater than 400 mg/dL invalidate calculated LDL results. . Reference range: <100 . Desirable range <100 mg/dL for primary prevention;   <70 mg/dL for patients with CHD or diabetic patients  with > or = 2 CHD risk factors. [name] LDL-C is now calculated using the Martin-Hopkins  calculation, which is a validated novel method providing  better accuracy than the Friedewald equation in the  estimation of LDL-C.  Cresenciano Genre et al. Annamaria Helling. 1610;960(45): 2061-2068  (http://education.[facility].com/faq/FAQ164)    LDLDIRECT 50.0 12/31/2021 1111     CHA2DS2-VASc Score = 4   This indicates a 4.8% annual risk of stroke. The patient's score is based upon: CHF History: 0 HTN History: 1 Diabetes History: 1 Stroke History: 0 Vascular Disease History: 1 Age Score: 1 Gender Score: 0   Additional studies/ records that were reviewed today include:  Prior OV notes and echo    ASSESSMENT:    1. Obstructive sleep apnea   2. Essential hypertension      PLAN:  In order of problems listed  above:  OSA  -he has lost over 35lbs and stopped using his device in Oct 23 -he has not had any daytime sleepiness and feels rested when he gets up in the am off CPAP and does not snore -I have recommended that we check a HST without his PAP and see if he still has residual apnea -if he still has OSA but is mild then will change his BiPAP to  CPAP    Hypertension -BP is adequately controlled on today -Continue prescription drug manage meant with Cardizem CD 360 mg daily with as needed refills  Time Spent: 20 minutes total time of encounter, including 15 minutes spent in face-to-face patient care on the date of this encounter. This time includes coordination of care and counseling regarding above mentioned problem list. Remainder of non-face-to-face time involved reviewing chart documents/testing relevant to the patient encounter and documentation in the medical record. I have independently reviewed documentation from referring provider  Medication Adjustments/Labs and Tests Ordered: Current medicines are reviewed at length with the patient today.  Concerns regarding medicines are outlined above.  Medication changes, Labs and Tests ordered today are listed in the Patient Instructions below.  There are no Patient Instructions on file for this visit.   Signed, Fransico Him, MD  09/22/2022 11:25 AM    [address], [place], [place]  [address] Phone: [phone]; Fax: [phone]

## 2022-09-22 NOTE — Patient Instructions (Signed)
Medication Instructions:  Your physician recommends that you continue on your current medications as directed. Please refer to the Current Medication list given to you today.  *If you need a refill on your cardiac medications before your next appointment, please call your pharmacy*  Testing/Procedures: Your physician has recommended that you have a sleep study - while you are not using BiPAP. This test records several body functions during sleep, including: brain activity, eye movement, oxygen and carbon dioxide blood levels, heart rate and rhythm, breathing rate and rhythm, the flow of air through your mouth and nose, snoring, body muscle movements, and chest and belly movement.  Follow-Up: At [facility], you and your health needs are our priority.  As part of our continuing mission to provide you with exceptional heart care, we have created designated Provider Care Teams.  These Care Teams include your primary Cardiologist (physician) and Advanced Practice Providers (APPs -  Physician Assistants and Nurse Practitioners) who all work together to provide you with the care you need, when you need it.  Your next appointment:   1 year(s)  Provider:   Fransico Him, MD

## 2022-09-30 ENCOUNTER — Ambulatory Visit: Payer: BLUE CROSS/BLUE SHIELD | Admitting: Family Medicine

## 2022-10-01 NOTE — Telephone Encounter (Signed)
Prior Authorization for [facility] sent to [facility] via web portal. Tracking Number .  do not require Pre-Authorization by [employer]

## 2022-10-02 NOTE — Telephone Encounter (Signed)
I s/w the pt and he has been given the PIN# [id number]. Pt is traveling right now and will do the study somewhere between tonight and early next week.   Called and made the patient aware that he may proceed with the [facility] Sleep Study. PIN # provided to the patient. Patient made aware that he will be contacted after the test has been read with the results and any recommendations. Patient verbalized understanding and thanked me for the call.

## 2022-10-05 ENCOUNTER — Telehealth: Payer: Self-pay

## 2022-10-05 NOTE — Telephone Encounter (Signed)
**Note De-Identified Tyrel Lex Obfuscation** I started a Vascepa PA through covermymeds. Key: [id number]

## 2022-10-12 NOTE — Telephone Encounter (Signed)
**Note De-Identified Sharion Grieves Obfuscation** Tracie Badertscher (Key: [id number]) Outcome: Approved on February 12 Effective from 10/05/2022 through 10/06/2023. Drug Vascepa 1GM capsules ePA cloud logo Form [employer]  Medicare Part D Electronic Request Form (CB)  I have notified [place] (Ph: [phone]) of this approval.

## 2022-10-13 ENCOUNTER — Other Ambulatory Visit (HOSPITAL_BASED_OUTPATIENT_CLINIC_OR_DEPARTMENT_OTHER): Payer: Self-pay

## 2022-10-13 ENCOUNTER — Encounter (INDEPENDENT_AMBULATORY_CARE_PROVIDER_SITE_OTHER): Payer: Medicare Other | Admitting: Cardiology

## 2022-10-13 DIAGNOSIS — G4733 Obstructive sleep apnea (adult) (pediatric): Secondary | ICD-10-CM

## 2022-10-14 ENCOUNTER — Ambulatory Visit: Payer: Medicare Other | Attending: Cardiology

## 2022-10-14 DIAGNOSIS — I10 Essential (primary) hypertension: Secondary | ICD-10-CM

## 2022-10-14 DIAGNOSIS — G4733 Obstructive sleep apnea (adult) (pediatric): Secondary | ICD-10-CM

## 2022-10-14 NOTE — Procedures (Signed)
       SLEEP STUDY REPORT Patient Information Study Date: 10/13/2022 Patient Name: Eric Palmer Patient ID: [id number] Birth Date: 10-01-1955 Age: 67 Gender: Male BMI: 30.4 (W=229 lb, H=6' 1'') Stopbang: 6 Referring Physician: Fransico Him, MD  TEST DESCRIPTION: Home sleep apnea testing was completed using the WatchPat, a Type 1 device, utilizing peripheral arterial tonometry (PAT), chest movement, actigraphy, pulse oximetry, pulse rate, body position and snore. AHI was calculated with apnea and hypopnea using valid sleep time as the denominator. RDI includes apneas, hypopneas, and RERAs. The data acquired and the scoring of sleep and all associated events were performed in accordance with the recommended standards and specifications as outlined in the AASM Manual for the Scoring of Sleep and Associated Events 2.2.0 (2015).   FINDINGS: 1.  No evidence of Obstructive Sleep Apnea with AHI 1.9/hr.  2.  No Central Sleep Apnea. 3.  Oxygen desaturations as low as 81%. 4.  Mild to moderate snoring was present. O2 sats were < 88% for 0.7 minutes. 5.  Total sleep time was 6 hrs and 22 min. 6.  29.3% of total sleep time was spent in REM sleep.  7.  Normal sleep onset latency at 16 min.  8.  Prolonged REM sleep onset latency at 132 min.  9.  Total awakenings were 9.   DIAGNOSIS:  Normal study with no significant sleep disordered breathing.  RECOMMENDATIONS:   1. Normal study with no significant sleep disordered breathing.  2.  Healthy sleep recommendations include:  adequate nightly sleep (normal 7-9 hrs/night), avoidance of caffeine after noon and alcohol near bedtime, and maintaining a sleep environment that is cool, dark and quiet.  3.  Weight loss for overweight patients is recommended.    4.  Snoring recommendations include:  weight loss where appropriate, side sleeping, and avoidance of alcohol before bed.  5.  Operation of motor vehicle or dangerous equipment must be avoided  when feeling drowsy, excessively sleepy, or mentally fatigued.    6.  An ENT consultation which may be useful for specific causes of and possible treatment of bothersome snoring.   7. Weight loss may be of benefit in reducing the severity of snoring.   Signature:   Fransico Him, MD; [facility]; [place], [place] Board of Sleep Medicine Electronically Signed: 10/14/2022

## 2022-10-16 ENCOUNTER — Telehealth: Payer: Self-pay | Admitting: *Deleted

## 2022-10-16 IMAGING — CT CT CHEST LUNG CANCER SCREENING LOW DOSE W/O CM
2 of 5 series · 14 of 40 positions shown, 17 images · non-contrast
Comparison: Low-dose lung cancer screening chest CT 05/19/2019.

CLINICAL DATA: 66-year-old male former smoker (quit July 2019)
with 46 pack-year history of smoking. Lung cancer screening
examination.

EXAM:
CT CHEST WITHOUT CONTRAST LOW-DOSE FOR LUNG CANCER SCREENING
TECHNIQUE: Multidetector CT imaging of the chest was performed following the
standard protocol without IV contrast.

[Series 4: lung 1.00 br44 cor · coronal · 0.68mm/px · 3 of 372 slices shown]
[im 75/372  lung]
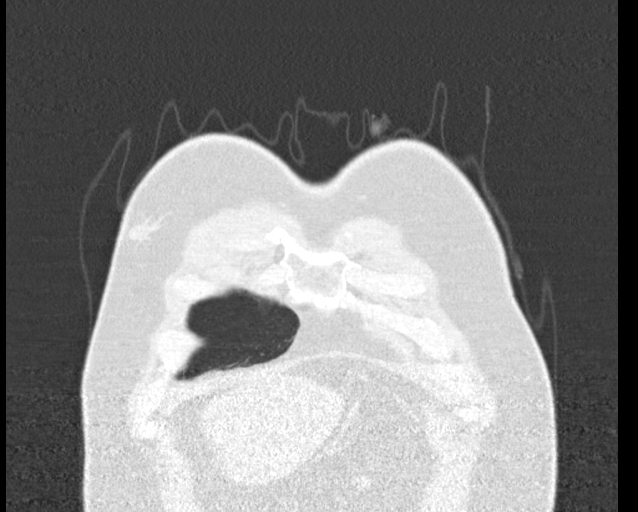
[im 149/372  lung]
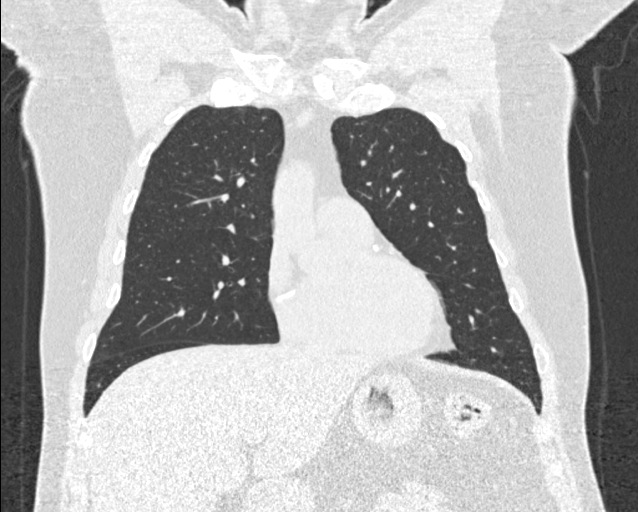
[im 223/372  lung]
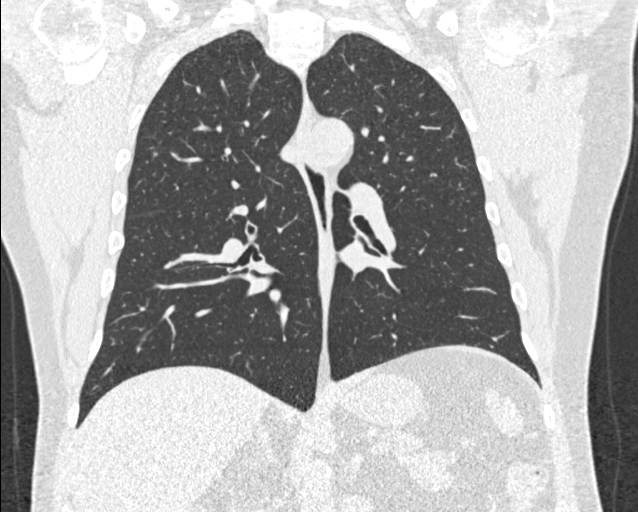

[Series 9: lung 1.00 br60 axial · axial · 0.78mm/px · z∈[-1008,-696]mm · 11 of 346 slices shown, 14 images]
[im 17/346  mediastinal]
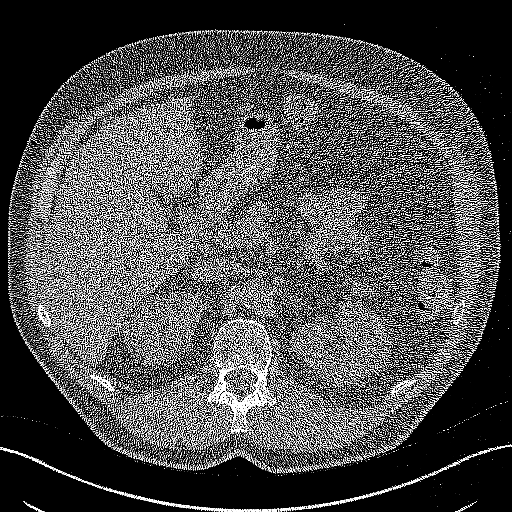
[im 17/346  lung]
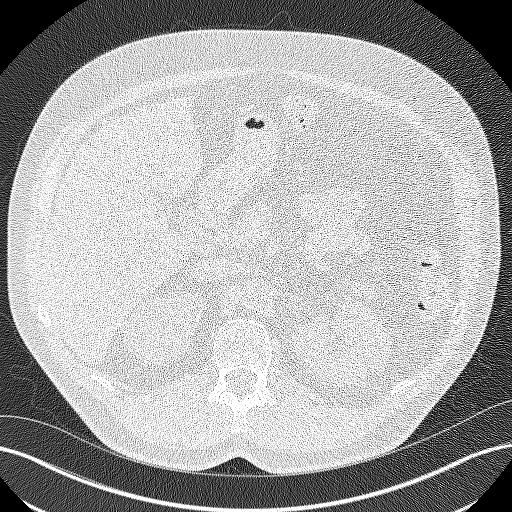
[im 50/346  lung]
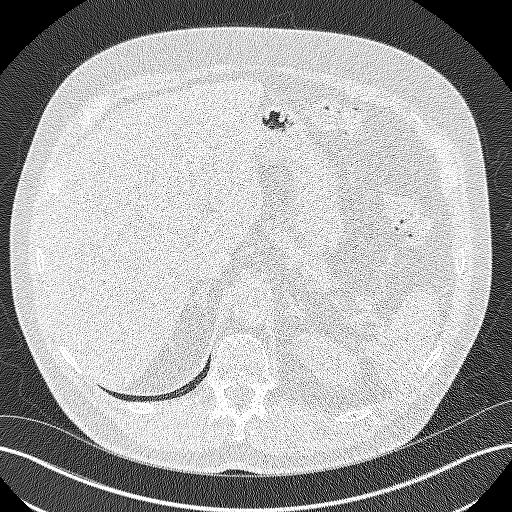
[im 83/346  lung]
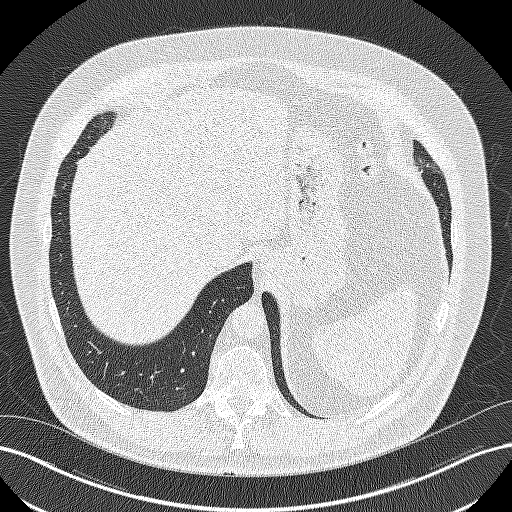
[im 115/346  lung]
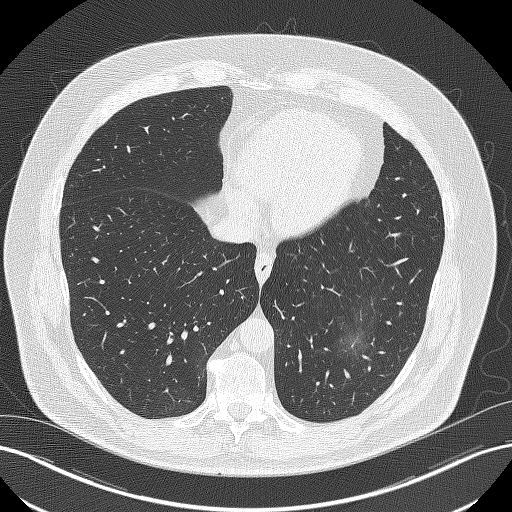
[im 148/346  mediastinal]
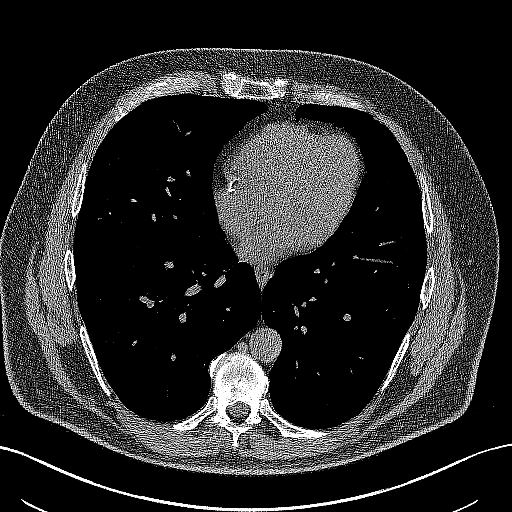
[im 148/346  lung]
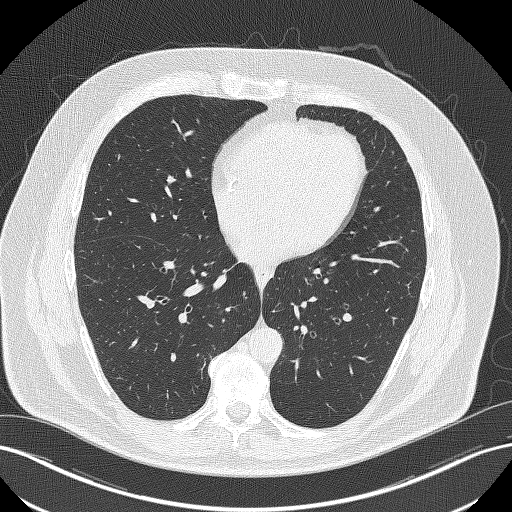
[im 181/346  lung]
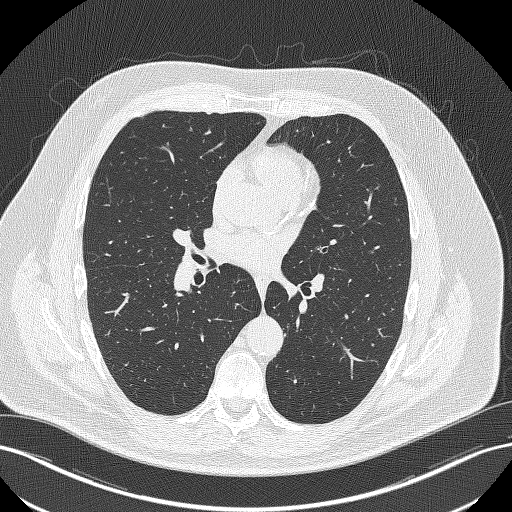
[im 198/346  lung]
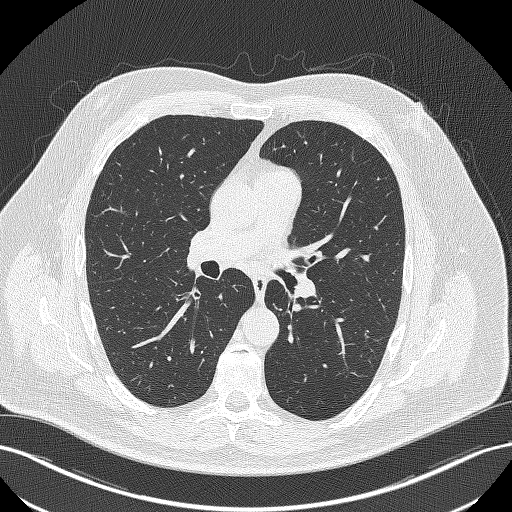
[im 230/346  lung]
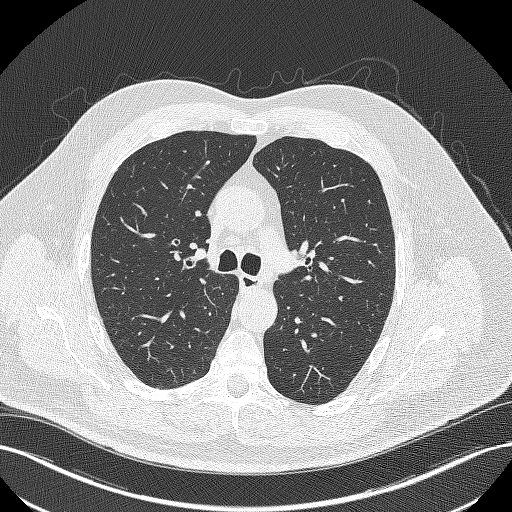
[im 263/346  mediastinal]
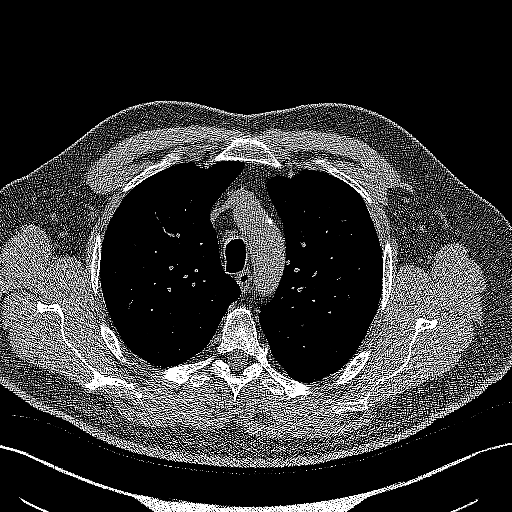
[im 263/346  lung]
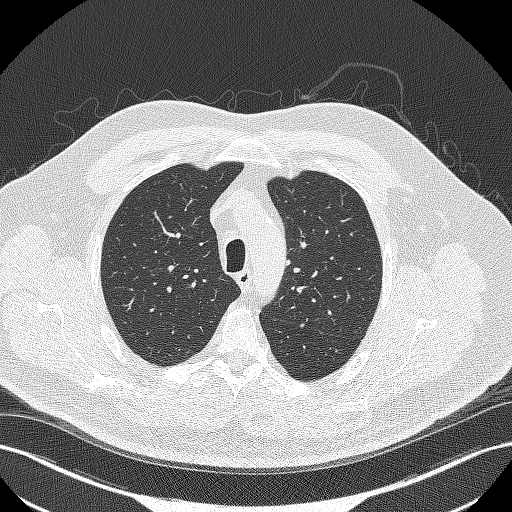
[im 296/346  lung]
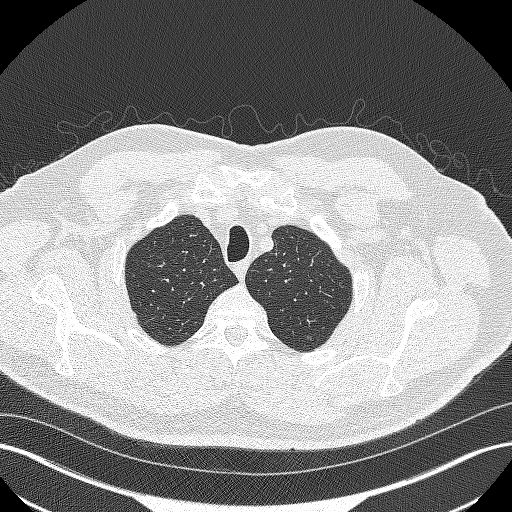
[im 329/346  lung]
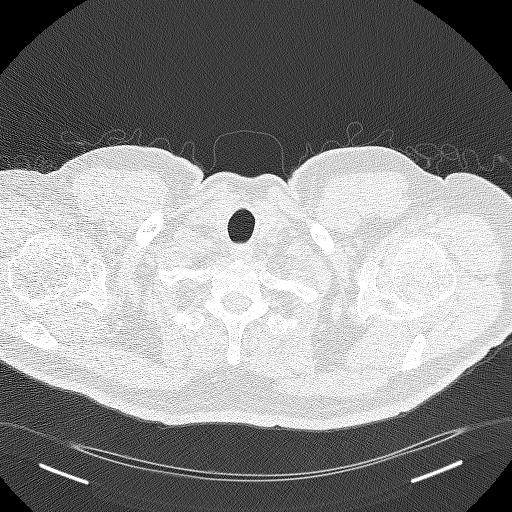

[14 of 40 positions shown; findings below may reference images not displayed]

FINDINGS: Cardiovascular: Heart size is normal. There is no significant
pericardial fluid, thickening or pericardial calcification. There is
aortic atherosclerosis, as well as atherosclerosis of the great
vessels of the mediastinum and the coronary arteries, including
calcified atherosclerotic plaque in the left main, left anterior
descending, left circumflex and right coronary arteries.

Mediastinum/Nodes: No pathologically enlarged mediastinal or hilar
lymph nodes. Please note that accurate exclusion of hilar adenopathy
is limited on noncontrast CT scans. Esophagus is unremarkable in
appearance. No axillary lymphadenopathy.

Lungs/Pleura: Tiny pulmonary nodules are noted in the lungs, largest
of which is in the medial segment of the right middle lobe (axial
image 228 of series 3), with a volume derived mean diameter of
mm. No other larger more suspicious appearing pulmonary nodules or
masses are noted. No acute consolidative airspace disease. No
pleural effusions. Mild diffuse bronchial wall thickening with very
mild centrilobular and paraseptal emphysema.

Upper Abdomen: Aortic atherosclerosis.

Musculoskeletal: There are no aggressive appearing lytic or blastic
lesions noted in the visualized portions of the skeleton.
IMPRESSION: 1. Lung-RADS 2S, benign appearance or behavior. Continue annual
screening with low-dose chest CT without contrast in 12 months.
2. The "S" modifier above refers to potentially clinically
significant non lung cancer related findings. Specifically, there is
aortic atherosclerosis, in addition to left main and 3 vessel
coronary artery disease. Please note that although the presence of
coronary artery calcium documents the presence of coronary artery
disease, the severity of this disease and any potential stenosis
cannot be assessed on this non-gated CT examination. Assessment for
potential risk factor modification, dietary therapy or pharmacologic
therapy may be warranted, if clinically indicated.
3. Mild diffuse bronchial wall thickening with very mild
centrilobular and paraseptal emphysema; imaging findings suggestive
of underlying COPD.

Aortic Atherosclerosis (OL55J-4G6.6) and Emphysema (OL55J-GB4.T).

## 2022-10-16 NOTE — Telephone Encounter (Signed)
The patient has been notified of the result and verbalized understanding.  All questions (if any) were answered. Eric Palmer, [place] 10/16/2022 12:00 PM    Patient is agreeable to results

## 2022-10-16 NOTE — Telephone Encounter (Signed)
-----   Message from Lauralee Evener, [place] sent at 10/16/2022  8:54 AM EST -----  ----- Message ----- From: Sueanne Margarita, MD Sent: 10/14/2022   1:22 PM EST To: [facility] Sleep Studies  No OSA - please verify this study was done without using CPAP. If he did not use his CPAP for this study then he can stop using CPAP because his OSA has resolved with weight loss

## 2022-10-21 DIAGNOSIS — K080 Exfoliation of teeth due to systemic causes: Secondary | ICD-10-CM | POA: Diagnosis not present

## 2022-10-24 ENCOUNTER — Other Ambulatory Visit (HOSPITAL_BASED_OUTPATIENT_CLINIC_OR_DEPARTMENT_OTHER): Payer: Self-pay

## 2022-10-31 ENCOUNTER — Other Ambulatory Visit: Payer: Self-pay | Admitting: Family Medicine

## 2022-10-31 ENCOUNTER — Other Ambulatory Visit (HOSPITAL_BASED_OUTPATIENT_CLINIC_OR_DEPARTMENT_OTHER): Payer: Self-pay

## 2022-11-01 ENCOUNTER — Encounter: Payer: Self-pay | Admitting: Cardiovascular Disease

## 2022-11-02 ENCOUNTER — Other Ambulatory Visit: Payer: Self-pay | Admitting: Family Medicine

## 2022-11-02 ENCOUNTER — Other Ambulatory Visit (HOSPITAL_BASED_OUTPATIENT_CLINIC_OR_DEPARTMENT_OTHER): Payer: Self-pay

## 2022-11-02 MED ORDER — DILTIAZEM HCL ER COATED BEADS 360 MG PO CP24
ORAL_CAPSULE | ORAL | 0 refills | Status: DC
  Filled 2022-11-02: qty 90, 90d supply, fill #0

## 2022-11-12 ENCOUNTER — Other Ambulatory Visit (HOSPITAL_BASED_OUTPATIENT_CLINIC_OR_DEPARTMENT_OTHER): Payer: Self-pay

## 2022-11-15 ENCOUNTER — Encounter: Payer: Self-pay | Admitting: Cardiovascular Disease

## 2022-11-15 DIAGNOSIS — I480 Paroxysmal atrial fibrillation: Secondary | ICD-10-CM

## 2022-11-16 ENCOUNTER — Other Ambulatory Visit: Payer: Self-pay

## 2022-11-16 ENCOUNTER — Other Ambulatory Visit (HOSPITAL_BASED_OUTPATIENT_CLINIC_OR_DEPARTMENT_OTHER): Payer: Self-pay

## 2022-11-16 MED ORDER — ICOSAPENT ETHYL 1 G PO CAPS
2.0000 g | ORAL_CAPSULE | Freq: Two times a day (BID) | ORAL | 0 refills | Status: DC
  Filled 2022-11-16: qty 360, 90d supply, fill #0

## 2022-11-16 MED ORDER — RIVAROXABAN 20 MG PO TABS
20.0000 mg | ORAL_TABLET | Freq: Every day | ORAL | 1 refills | Status: DC
  Filled 2022-11-16: qty 90, 90d supply, fill #0

## 2022-11-16 NOTE — Addendum Note (Signed)
Addended by: Leonidas Romberg on: 11/16/2022 08:18 AM   Modules accepted: Orders

## 2022-11-16 NOTE — Telephone Encounter (Signed)
Prescription refill request for Xarelto received.  Indication: Afib  Last office visit: 09/22/22 [place] Pax)  Weight: 104.6kg Age: 67 Scr: 0.96 (12/31/21)  CrCl: 113.25ml/min  Appropriate dose. Refill sent.

## 2022-11-19 ENCOUNTER — Other Ambulatory Visit (HOSPITAL_BASED_OUTPATIENT_CLINIC_OR_DEPARTMENT_OTHER): Payer: Self-pay

## 2022-12-01 ENCOUNTER — Other Ambulatory Visit: Payer: Self-pay

## 2022-12-07 ENCOUNTER — Other Ambulatory Visit (HOSPITAL_BASED_OUTPATIENT_CLINIC_OR_DEPARTMENT_OTHER): Payer: Self-pay

## 2022-12-07 ENCOUNTER — Other Ambulatory Visit: Payer: Self-pay | Admitting: Cardiovascular Disease

## 2022-12-07 MED FILL — Sotalol HCl Tab 80 MG: ORAL | 90 days supply | Qty: 180 | Fill #0 | Status: AC

## 2022-12-08 ENCOUNTER — Telehealth: Payer: Medicare Other | Admitting: Adult Health

## 2022-12-10 NOTE — Progress Notes (Signed)
Chief Complaint  Patient presents with   Follow-up    CAD    History of Present Illness: 67 yo male with history of CAD, paroxysmal atrial fibrillation, diabetes, HTN, hyperlipidemia, sleep apnea and obesity here today for cardiac follow up. He has been followed in our office by Dr. Katrinka Blazing. He had a stent placed in the RCA in 1996. Cardiac cath February 2019 with 50% proximal RCA in stent restenosis, 50-70% mid LAD stenosis, LVEF=50-55%. No ischemia in the LAD territory on nuclear stress test prior to this cath. He has paroxysmal atrial fibrillation and is on Xarelto and Sotalol. He underwent atrial fibrillation ablation in 2015. Last echo in December 2018 with LVEF=55-60%. No significant valve disease. Abdominal u/s February 2022 with no evidence of AAA.   He is from [place], [place] originally and graduated from [employer]. 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   He is here today for follow up. The patient denies any chest pain, dyspnea, palpitations, lower extremity edema, orthopnea, PND, dizziness, near syncope or syncope.   Primary Care Physician: Kristian Covey, MD   Past Medical History:  Diagnosis Date   Allergy    Anal fissure    Chronic bronchitis    "get it q spring and fall" (10/27/2013)   Coronary artery disease    a. s/p PCI in 1996. b. LHC 01/2006 showed 50% mid LAD, otherwise widely patent cors with minimal irregularities. c. Nuclear stress test in 04/2009 was reportedly low risk, no ischemia. // d. Nuclear stress test 2/19: EF 55, transient ST depression in recovery, no ischemia, inf defect (artifact vs scar)    DM type 2, uncontrolled, with neuropathy    ED (erectile dysfunction)    Essential hypertension    Hx of echocardiogram    Echo (10/2013): Mild LVH, moderate focal basal hypertrophy of the septum, EF 50-55%, no RWMA   Hyperlipidemia    Internal hemorrhoids without mention of complication    Obesity    Obesity    OSA on CPAP    Paresthesia  of both legs    Paroxysmal atrial fibrillation    a. failed Tikosyn, Multaq. b. s/p ablation of AF/AFL in 12/2013.   Paroxysmal atrial flutter    a. s/p ablation of AF/AFL in 12/2013.   Sleep apnea    Smoker    QUIT 10/13/13   Tubular adenoma of colon 2016    Past Surgical History:  Procedure Laterality Date   ABLATION  01-12-2014   PVI and CTI by Dr Johney Frame   ANAL FISSURE REPAIR  ~ 2010   ATRIAL FIBRILLATION ABLATION N/A 01/12/2014   Procedure: ATRIAL FIBRILLATION ABLATION;  Surgeon: Gardiner Rhyme, MD;  Location: [facility] CATH LAB;  Service: Cardiovascular;  Laterality: N/A;   CARDIAC CATHETERIZATION  2007   CORONARY ANGIOPLASTY WITH STENT PLACEMENT  1996   residual 50% LAD and RCA by cath in 2007   CORONARY ANGIOPLASTY WITH STENT PLACEMENT  1995   INGUINAL HERNIA REPAIR Right 1963   LEFT HEART CATH AND CORONARY ANGIOGRAPHY N/A 10/11/2017   Procedure: LEFT HEART CATH AND CORONARY ANGIOGRAPHY;  Surgeon: Lyn Records, MD;  Location: [facility] INVASIVE CV LAB;  Service: Cardiovascular;  Laterality: N/A;   TEE WITHOUT CARDIOVERSION N/A 01/11/2014   Procedure: TRANSESOPHAGEAL ECHOCARDIOGRAM (TEE);  Surgeon: Vesta Mixer, MD;  Location: [facility] ENDOSCOPY;  Service: Cardiovascular;  Laterality: N/A;   TONSILLECTOMY AND ADENOIDECTOMY  1979    Current Outpatient Medications  Medication  Sig Dispense Refill   albuterol (VENTOLIN HFA) 108 (90 Base) MCG/ACT inhaler Inhale 2 puffs into the lungs every 4-6 hours as needed for cough/wheeze. 6.7 g 1   atorvastatin (LIPITOR) 40 MG tablet Take 1 tablet by mouth daily at 6 pm 90 tablet 3   azelastine (ASTELIN) 0.1 % nasal spray Place 2 sprays into both nostrils 2 (two) times daily. Use in each nostril as directed 30 mL 11   empagliflozin (JARDIANCE) 25 MG TABS tablet Take 1 tablet (25 mg total) by mouth daily. 90 tablet 1   fluticasone-salmeterol (ADVAIR DISKUS) 100-50 MCG/ACT AEPB Inhale 1 puff into the lungs 2 (two) times daily. 60 each 11   glucose blood (ONETOUCH  VERIO) test strip Test blood sugar once daily 100 each 0   icosapent Ethyl (VASCEPA) 1 g capsule Take 2 capsules (2 g total) by mouth 2 (two) times daily. 360 capsule 0   metFORMIN (GLUCOPHAGE-XR) 750 MG 24 hr tablet Take 1 tablet (750 mg) by mouth twice daily. 180 tablet 1   ONETOUCH DELICA LANCETS FINE MISC USE TO CHECK BLOOD SUGAR ONCE DAILY 100 each 0   tadalafil (CIALIS) 20 MG tablet Take 1 tablet (20 mg total) by mouth daily as needed for erectile dysfunction. 10 tablet 3   diltiazem (CARDIZEM CD) 360 MG 24 hr capsule Take 1 capsule (360 mg total) by mouth daily. 90 capsule 3   nitroGLYCERIN (NITROSTAT) 0.4 MG SL tablet DISSOLVE 1 TABLET UNDER THE TONGUE EVERY 5 MINUTES FOR 3 DOSES AS NEEDED FOR CHEST PAIN(CALL 911 IF 3RD DOSE IS TAKEN) 25 tablet 3   rivaroxaban (XARELTO) 20 MG TABS tablet Take 1 tablet (20 mg total) by mouth daily with supper. 90 tablet 3   sotalol (BETAPACE) 80 MG tablet Take 1 tablet (80 mg total) by mouth 2 (two) times daily. 180 tablet 3   No current facility-administered medications for this visit.    Allergies  Allergen Reactions   Cephalosporins Anaphylaxis   Penicillins Anaphylaxis and Other (See Comments)    Has patient had a PCN reaction causing immediate rash, facial/tongue/throat swelling, SOB or lightheadedness with hypotension: Yes Has patient had a PCN reaction causing severe rash involving mucus membranes or skin necrosis: No Has patient had a PCN reaction that required hospitalization: No - went to doctors office Has patient had a PCN reaction occurring within the last 10 years: No If all of the above answers are "NO", then may proceed with Cephalosporin use.    Thallium Itching and Rash   Codeine Itching   Gabapentin Other (See Comments)    "made me feel drugged"   Eliquis [Apixaban] Diarrhea    Social History   Socioeconomic History   Marital status: Married    Spouse name: Not on file   Number of children: Not on file   Years of  education: Not on file   Highest education level: Not on file  Occupational History   Not on file  Tobacco Use   Smoking status: Former    Packs/day: 1.00    Years: 43.00    Additional pack years: 0.00    Total pack years: 43.00    Types: Cigarettes    Quit date: 2020    Years since quitting: 4.3   Smokeless tobacco: Never  Vaping Use   Vaping Use: Never used  Substance and Sexual Activity   Alcohol use: Yes    Alcohol/week: 0.0 standard drinks of alcohol    Comment: 10/27/2013 "might have 1 beer/month"  Drug use: No   Sexual activity: Yes  Other Topics Concern   Not on file  Social History Narrative   Works as an Psychologist, 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 Strain: Low Risk  (04/02/2022)   Overall Financial Resource Strain (CARDIA)    Difficulty of Paying Living Expenses: Not hard at all  Food Insecurity: No Food Insecurity (04/02/2022)   Hunger Vital Sign    Worried About Running Out of Food in the Last Year: Never true    Ran Out of Food in the Last Year: Never true  Transportation Needs: No Transportation Needs (04/02/2022)   PRAPARE - Administrator, Civil Service (Medical): No    Lack of Transportation (Non-Medical): No  Physical Activity: Sufficiently Active (04/02/2022)   Exercise Vital Sign    Days of Exercise per Week: 3 days    Minutes of Exercise per Session: 60 min  Stress: No Stress Concern Present (04/02/2022)   [employer] of Occupational Health - Occupational Stress Questionnaire    Feeling of Stress : Not at all  Social Connections: Unknown (04/02/2022)   Social Connection and Isolation Panel [NHANES]    Frequency of Communication with Friends and Family: More than three times a week    Frequency of Social Gatherings with Friends and Family: More than three times a week    Attends Religious Services: Patient declined    Database administrator or Organizations: No    Attends Engineer, structural:  Not on file    Marital Status: Married  Catering manager Violence: Not on file    Family History  Problem Relation Age of Onset   Diabetes Mother    Heart disease Mother 3       Arrhythmia   Hypertension Mother    Heart disease Father 30       died 76 CHF   Heart attack Father    Heart disease Brother    Hyperlipidemia Other    Heart disease Other    Stroke Neg Hx    Colon cancer Neg Hx    Esophageal cancer Neg Hx    Liver disease Neg Hx    Pancreatic cancer Neg Hx    Stomach cancer Neg Hx     Review of Systems:  As stated in the HPI and otherwise negative.   BP 118/80   Pulse 68   Ht  (1.854 m)   Wt 105.8 kg   SpO2 98%   BMI 30.77 kg/m   Physical Examination: General: Well developed, well nourished, NAD  HEENT: OP clear, mucus membranes moist  SKIN: warm, dry. No rashes. Neuro: No focal deficits  Musculoskeletal: Muscle strength 5/5 all ext  Psychiatric: Mood and affect normal  Neck: No JVD, no carotid bruits, no thyromegaly, no lymphadenopathy.  Lungs:Clear bilaterally, no wheezes, rhonci, crackles Cardiovascular: Regular rate and rhythm. No murmurs, gallops or rubs. Abdomen:Soft. Bowel sounds present. Non-tender.  Extremities: No lower extremity edema. Pulses are 2 + in the bilateral DP/PT.  EKG:  EKG is ordered today. The ekg ordered today demonstrates NSR  Recent Labs: 12/31/2021: ALT 26; BUN 13; Creatinine, Ser 0.96; Hemoglobin 16.2; Platelets 244.0; Potassium 4.2; Sodium 139; TSH 1.40   Lipid Panel    Component Value Date/Time   CHOL 112 12/31/2021 1111   CHOL 130 06/29/2019 0724   TRIG 235.0 (H) 12/31/2021 1111   HDL 37.10 (L) 12/31/2021 1111   HDL 35 (L) 06/29/2019 4696  CHOLHDL 3 12/31/2021 1111   VLDL 47.0 (H) 12/31/2021 1111   LDLCALC  07/01/2020 1411     Comment:     . LDL cholesterol not calculated. Triglyceride levels greater than 400 mg/dL invalidate calculated LDL results. . Reference range: <100 . Desirable range <100  mg/dL for primary prevention;   <70 mg/dL for patients with CHD or diabetic patients  with > or = 2 CHD risk factors. [name] LDL-C is now calculated using the Martin-Hopkins  calculation, which is a validated novel method providing  better accuracy than the Friedewald equation in the  estimation of LDL-C.  Horald Pollen et al. Lenox Ahr. 5409;811(91): 2061-2068  (http://education.[facility].com/faq/FAQ164)    LDLDIRECT 50.0 12/31/2021 1111     Wt Readings from Last 3 Encounters:  12/11/22 105.8 kg  09/22/22 104.6 kg  08/26/22 105.2 kg    Assessment and Plan:   1. CAD without angina: Last cardiac cath in 2019 with moderate RCA stent restenosis and moderate LAD stenosis that was not felt to be flow limiting. He has no chest pain suggestive of angina. Will continue statin.   2. HTN: BP is well controlled. No changes in current therapy  3. HLD: LDL at goal in May 2023. Continue statin. Lipids followed in primary care.    4. Paroxysmal atrial fibrillation: Sinus today. Continue Cardizem, Sotalol and Xarelto.   5. Sleep apnea: Mild by recent testing. No longer using. CPAP  Labs/ tests ordered today include:   Orders Placed This Encounter  Procedures   EKG 12-Lead   Disposition:   F/U with me in 12 months  Signed, Verne Carrow, MD, [facility] 12/11/2022 9:44 AM    [facility] [address] [place], [place], [place]  [address] Phone: [phone]; Fax: [phone]

## 2022-12-11 ENCOUNTER — Encounter: Payer: Self-pay | Admitting: Cardiovascular Disease

## 2022-12-11 ENCOUNTER — Ambulatory Visit: Payer: Medicare Other | Attending: Cardiovascular Disease | Admitting: Cardiovascular Disease

## 2022-12-11 ENCOUNTER — Other Ambulatory Visit (HOSPITAL_BASED_OUTPATIENT_CLINIC_OR_DEPARTMENT_OTHER): Payer: Self-pay

## 2022-12-11 VITALS — BP 118/80 | HR 68 | Ht 73.0 in | Wt 233.2 lb

## 2022-12-11 DIAGNOSIS — G4733 Obstructive sleep apnea (adult) (pediatric): Secondary | ICD-10-CM | POA: Diagnosis not present

## 2022-12-11 DIAGNOSIS — I480 Paroxysmal atrial fibrillation: Secondary | ICD-10-CM

## 2022-12-11 DIAGNOSIS — I2510 Atherosclerotic heart disease of native coronary artery without angina pectoris: Secondary | ICD-10-CM | POA: Diagnosis not present

## 2022-12-11 DIAGNOSIS — E7849 Other hyperlipidemia: Secondary | ICD-10-CM

## 2022-12-11 DIAGNOSIS — I10 Essential (primary) hypertension: Secondary | ICD-10-CM

## 2022-12-11 MED ORDER — DILTIAZEM HCL ER COATED BEADS 360 MG PO CP24
360.0000 mg | ORAL_CAPSULE | Freq: Every day | ORAL | 3 refills | Status: DC
  Filled 2022-12-11: qty 90, fill #0
  Filled 2023-01-21: qty 90, 90d supply, fill #0
  Filled 2023-04-21: qty 90, 90d supply, fill #1
  Filled 2023-07-18: qty 90, 90d supply, fill #2
  Filled 2023-09-06 – 2023-10-16 (×2): qty 90, 90d supply, fill #3

## 2022-12-11 MED ORDER — SOTALOL HCL 80 MG PO TABS
80.0000 mg | ORAL_TABLET | Freq: Two times a day (BID) | ORAL | 3 refills | Status: DC
  Filled 2022-12-11 – 2023-03-04 (×2): qty 180, 90d supply, fill #0
  Filled 2023-05-29 – 2023-05-31 (×3): qty 180, 90d supply, fill #1
  Filled 2023-09-04 (×2): qty 180, 90d supply, fill #2
  Filled 2023-12-04 (×2): qty 180, 90d supply, fill #3

## 2022-12-11 MED ORDER — RIVAROXABAN 20 MG PO TABS
20.0000 mg | ORAL_TABLET | Freq: Every day | ORAL | 3 refills | Status: DC
  Filled 2022-12-11 – 2023-02-15 (×2): qty 90, 90d supply, fill #0
  Filled 2023-02-17 (×2): qty 30, 30d supply, fill #0
  Filled 2023-03-18: qty 30, 30d supply, fill #1
  Filled 2023-04-21: qty 30, 30d supply, fill #2
  Filled 2023-05-19: qty 30, 30d supply, fill #3
  Filled 2023-06-19: qty 30, 30d supply, fill #4
  Filled 2023-07-18: qty 30, 30d supply, fill #5
  Filled 2023-08-17: qty 30, 30d supply, fill #6
  Filled 2023-09-16: qty 30, 30d supply, fill #7
  Filled 2023-10-16: qty 30, 30d supply, fill #8
  Filled 2023-11-14: qty 30, 30d supply, fill #9

## 2022-12-11 MED ORDER — NITROGLYCERIN 0.4 MG SL SUBL
SUBLINGUAL_TABLET | SUBLINGUAL | 3 refills | Status: DC
  Filled 2022-12-11: qty 25, 8d supply, fill #0

## 2022-12-11 NOTE — Patient Instructions (Signed)
Medication Instructions:  Your physician recommends that you continue on your current medications as directed. Please refer to the Current Medication list given to you today.  *If you need a refill on your cardiac medications before your next appointment, please call your pharmacy*  Lab Work: None ordered  Testing/Procedures: None ordered  Follow-Up: At [facility], you and your health needs are our priority.  As part of our continuing mission to provide you with exceptional heart care, we have created designated Provider Care Teams.  These Care Teams include your primary Cardiologist (physician) and Advanced Practice Providers (APPs -  Physician Assistants and Nurse Practitioners) who all work together to provide you with the care you need, when you need it.  Your next appointment:   1 year(s)  The format for your next appointment:   In Person  Provider:   Verne Carrow, MD {

## 2022-12-18 ENCOUNTER — Other Ambulatory Visit (HOSPITAL_BASED_OUTPATIENT_CLINIC_OR_DEPARTMENT_OTHER): Payer: Self-pay

## 2022-12-20 ENCOUNTER — Other Ambulatory Visit: Payer: Self-pay | Admitting: Family Medicine

## 2022-12-20 DIAGNOSIS — E1142 Type 2 diabetes mellitus with diabetic polyneuropathy: Secondary | ICD-10-CM

## 2022-12-21 ENCOUNTER — Other Ambulatory Visit (HOSPITAL_BASED_OUTPATIENT_CLINIC_OR_DEPARTMENT_OTHER): Payer: Self-pay

## 2022-12-21 MED ORDER — METFORMIN HCL ER 750 MG PO TB24
ORAL_TABLET | ORAL | 1 refills | Status: DC
  Filled 2022-12-21: qty 180, 90d supply, fill #0
  Filled 2023-03-18: qty 180, 90d supply, fill #1

## 2022-12-21 MED ORDER — EMPAGLIFLOZIN 25 MG PO TABS
25.0000 mg | ORAL_TABLET | Freq: Every day | ORAL | 1 refills | Status: DC
  Filled 2022-12-21: qty 30, 30d supply, fill #0
  Filled 2023-01-20: qty 30, 30d supply, fill #1
  Filled 2023-02-15: qty 30, 30d supply, fill #2
  Filled 2023-03-18: qty 30, 30d supply, fill #3
  Filled 2023-04-21: qty 30, 30d supply, fill #4
  Filled 2023-05-19: qty 30, 30d supply, fill #5

## 2023-01-11 ENCOUNTER — Ambulatory Visit: Payer: Medicare Other | Admitting: Cardiovascular Disease

## 2023-01-20 ENCOUNTER — Other Ambulatory Visit (HOSPITAL_BASED_OUTPATIENT_CLINIC_OR_DEPARTMENT_OTHER): Payer: Self-pay

## 2023-01-21 ENCOUNTER — Other Ambulatory Visit: Payer: Self-pay | Admitting: Family Medicine

## 2023-01-21 ENCOUNTER — Other Ambulatory Visit (HOSPITAL_BASED_OUTPATIENT_CLINIC_OR_DEPARTMENT_OTHER): Payer: Self-pay

## 2023-01-22 ENCOUNTER — Other Ambulatory Visit (HOSPITAL_BASED_OUTPATIENT_CLINIC_OR_DEPARTMENT_OTHER): Payer: Self-pay

## 2023-01-22 ENCOUNTER — Other Ambulatory Visit: Payer: Self-pay

## 2023-01-22 MED ORDER — ATORVASTATIN CALCIUM 40 MG PO TABS
ORAL_TABLET | ORAL | 0 refills | Status: DC
  Filled 2023-01-22: qty 90, 90d supply, fill #0

## 2023-02-02 ENCOUNTER — Other Ambulatory Visit (HOSPITAL_BASED_OUTPATIENT_CLINIC_OR_DEPARTMENT_OTHER): Payer: Self-pay

## 2023-02-02 MED ORDER — ATORVASTATIN CALCIUM 40 MG PO TABS
40.0000 mg | ORAL_TABLET | Freq: Every day | ORAL | 3 refills | Status: DC
  Filled 2023-02-02 – 2023-04-21 (×2): qty 90, 90d supply, fill #0
  Filled 2023-07-18: qty 90, 90d supply, fill #1
  Filled 2023-10-31: qty 90, 90d supply, fill #2

## 2023-02-15 ENCOUNTER — Other Ambulatory Visit: Payer: Self-pay | Admitting: Cardiovascular Disease

## 2023-02-15 ENCOUNTER — Other Ambulatory Visit (HOSPITAL_BASED_OUTPATIENT_CLINIC_OR_DEPARTMENT_OTHER): Payer: Self-pay

## 2023-02-16 ENCOUNTER — Other Ambulatory Visit (HOSPITAL_BASED_OUTPATIENT_CLINIC_OR_DEPARTMENT_OTHER): Payer: Self-pay

## 2023-02-16 MED ORDER — ICOSAPENT ETHYL 1 G PO CAPS
2.0000 g | ORAL_CAPSULE | Freq: Two times a day (BID) | ORAL | 2 refills | Status: DC
  Filled 2023-02-16: qty 120, 30d supply, fill #0
  Filled 2023-03-18: qty 120, 30d supply, fill #1
  Filled 2023-04-21: qty 120, 30d supply, fill #2
  Filled 2023-05-19: qty 120, 30d supply, fill #3
  Filled 2023-06-19: qty 120, 30d supply, fill #4
  Filled 2023-07-18: qty 120, 30d supply, fill #5
  Filled 2023-08-17: qty 120, 30d supply, fill #6
  Filled 2023-09-16: qty 120, 30d supply, fill #7
  Filled 2023-10-16: qty 120, 30d supply, fill #8

## 2023-02-16 NOTE — Telephone Encounter (Signed)
Rx(s) sent to pharmacy electronically.  

## 2023-02-17 ENCOUNTER — Other Ambulatory Visit (HOSPITAL_BASED_OUTPATIENT_CLINIC_OR_DEPARTMENT_OTHER): Payer: Self-pay

## 2023-02-18 ENCOUNTER — Other Ambulatory Visit (HOSPITAL_BASED_OUTPATIENT_CLINIC_OR_DEPARTMENT_OTHER): Payer: Self-pay

## 2023-03-04 ENCOUNTER — Other Ambulatory Visit (HOSPITAL_BASED_OUTPATIENT_CLINIC_OR_DEPARTMENT_OTHER): Payer: Self-pay

## 2023-03-18 ENCOUNTER — Other Ambulatory Visit (HOSPITAL_BASED_OUTPATIENT_CLINIC_OR_DEPARTMENT_OTHER): Payer: Self-pay

## 2023-03-18 DIAGNOSIS — K080 Exfoliation of teeth due to systemic causes: Secondary | ICD-10-CM | POA: Diagnosis not present

## 2023-03-19 DIAGNOSIS — K080 Exfoliation of teeth due to systemic causes: Secondary | ICD-10-CM | POA: Diagnosis not present

## 2023-03-22 ENCOUNTER — Encounter: Payer: Self-pay | Admitting: Family Medicine

## 2023-03-22 ENCOUNTER — Ambulatory Visit (INDEPENDENT_AMBULATORY_CARE_PROVIDER_SITE_OTHER): Payer: Medicare Other | Admitting: Family Medicine

## 2023-03-22 VITALS — BP 130/80 | HR 65 | Temp 97.5°F | Ht 73.0 in | Wt 234.2 lb

## 2023-03-22 DIAGNOSIS — E7849 Other hyperlipidemia: Secondary | ICD-10-CM

## 2023-03-22 DIAGNOSIS — Z79899 Other long term (current) drug therapy: Secondary | ICD-10-CM

## 2023-03-22 DIAGNOSIS — I499 Cardiac arrhythmia, unspecified: Secondary | ICD-10-CM | POA: Diagnosis not present

## 2023-03-22 DIAGNOSIS — E1142 Type 2 diabetes mellitus with diabetic polyneuropathy: Secondary | ICD-10-CM | POA: Diagnosis not present

## 2023-03-22 DIAGNOSIS — I10 Essential (primary) hypertension: Secondary | ICD-10-CM | POA: Diagnosis not present

## 2023-03-22 DIAGNOSIS — R062 Wheezing: Secondary | ICD-10-CM | POA: Diagnosis not present

## 2023-03-22 LAB — POC COVID19 BINAXNOW: SARS Coronavirus 2 Ag: NEGATIVE

## 2023-03-22 MED ORDER — METHYLPREDNISOLONE ACETATE 80 MG/ML IJ SUSP
80.0000 mg | Freq: Once | INTRAMUSCULAR | Status: AC
  Administered 2023-03-22: 80 mg via INTRAMUSCULAR

## 2023-03-22 NOTE — Progress Notes (Signed)
Established Patient Office Visit  Subjective   Patient ID: Eric Palmer, male    DOB: November 18, 1955  Age: 67 y.o. MRN: [id number]  Chief Complaint  Patient presents with   Wheezing   Chest Pain    HPI   Eric Palmer has history of atrial fibrillation, CAD, hypertension, asthma, obstructive sleep apnea, type 2 diabetes, hyperlipidemia.  He is seen with about 6-week history of some intermittent productive cough and sensation of "lung tightness ".  Denies any actual chest pain.  No recent fever.  He usually gets some temporary relief with Advair and albuterol and relates that he has perhaps been overusing albuterol at times.  Occasional postnasal drip symptoms.  Intermittent hoarseness.  No active GERD symptoms.  They did recently get a new pet with a new dog about 3 weeks ago but his symptoms preceded that by several weeks.  Feeling a bit more fatigued than usual.  No chest pain.  Does have history of atrial fibrillation and is on chronic anticoagulation with Xarelto.  His other medications include Lipitor, Cardizem CD, Jardiance, Vascepa, metformin, sotalol.  Recently saw cardiologist back in April  and no evidence for atrial fibrillation at that time.  Has been still active with exercise and golf without significant limitation.  Not monitoring blood sugars regularly.  Overdue for labs.  Past Medical History:  Diagnosis Date   Allergy    Anal fissure    Chronic bronchitis ([name])    "get it q spring and fall" (10/27/2013)   Coronary artery disease    a. s/p PCI in 1996. b. LHC 01/2006 showed 50% mid LAD, otherwise widely patent cors with minimal irregularities. c. Nuclear stress test in 04/2009 was reportedly low risk, no ischemia. // d. Nuclear stress test 2/19: EF 55, transient ST depression in recovery, no ischemia, inf defect (artifact vs scar)    DM type 2, uncontrolled, with neuropathy    ED (erectile dysfunction)    Essential hypertension    Hx of echocardiogram    Echo (10/2013): Mild LVH,  moderate focal basal hypertrophy of the septum, EF 50-55%, no RWMA   Hyperlipidemia    Internal hemorrhoids without mention of complication    Obesity    Obesity    OSA on CPAP    Paresthesia of both legs    Paroxysmal atrial fibrillation ([name])    a. failed Tikosyn, Multaq. b. s/p ablation of AF/AFL in 12/2013.   Paroxysmal atrial flutter ([name])    a. s/p ablation of AF/AFL in 12/2013.   Sleep apnea    Smoker    QUIT 10/13/13   Tubular adenoma of colon 2016   Past Surgical History:  Procedure Laterality Date   ABLATION  01-12-2014   PVI and CTI by Dr Johney Frame   ANAL FISSURE REPAIR  ~ 2010   ATRIAL FIBRILLATION ABLATION N/A 01/12/2014   Procedure: ATRIAL FIBRILLATION ABLATION;  Surgeon: Gardiner Rhyme, MD;  Location: [facility] CATH LAB;  Service: Cardiovascular;  Laterality: N/A;   CARDIAC CATHETERIZATION  2007   CORONARY ANGIOPLASTY WITH STENT PLACEMENT  1996   residual 50% LAD and RCA by cath in 2007   CORONARY ANGIOPLASTY WITH STENT PLACEMENT  1995   INGUINAL HERNIA REPAIR Right 1963   LEFT HEART CATH AND CORONARY ANGIOGRAPHY N/A 10/11/2017   Procedure: LEFT HEART CATH AND CORONARY ANGIOGRAPHY;  Surgeon: Lyn Records, MD;  Location: [facility] INVASIVE CV LAB;  Service: Cardiovascular;  Laterality: N/A;   TEE WITHOUT CARDIOVERSION N/A 01/11/2014  Procedure: TRANSESOPHAGEAL ECHOCARDIOGRAM (TEE);  Surgeon: Vesta Mixer, MD;  Location: [facility] ENDOSCOPY;  Service: Cardiovascular;  Laterality: N/A;   TONSILLECTOMY AND ADENOIDECTOMY  1979    reports that he quit smoking about 4 years ago. His smoking use included cigarettes. He started smoking about 47 years ago. He has a 43 pack-year smoking history. He has never used smokeless tobacco. He reports current alcohol use. He reports that he does not use drugs. family history includes Diabetes in his mother; Heart attack in his father; Heart disease in his brother and another family member; Heart disease (age of onset: 74) in his father; Heart disease (age of  onset: 22) in his mother; Hyperlipidemia in an other family member; Hypertension in his mother. Allergies  Allergen Reactions   Cephalosporins Anaphylaxis   Penicillins Anaphylaxis and Other (See Comments)    Has patient had a PCN reaction causing immediate rash, facial/tongue/throat swelling, SOB or lightheadedness with hypotension: Yes Has patient had a PCN reaction causing severe rash involving mucus membranes or skin necrosis: No Has patient had a PCN reaction that required hospitalization: No - went to doctors office Has patient had a PCN reaction occurring within the last 10 years: No If all of the above answers are "NO", then may proceed with Cephalosporin use.    Thallium Itching and Rash   Codeine Itching   Gabapentin Other (See Comments)    "made me feel drugged"   Eliquis [Apixaban] Diarrhea    Review of Systems  Constitutional:  Positive for malaise/fatigue. Negative for weight loss.  Respiratory:  Positive for cough, shortness of breath and wheezing. Negative for hemoptysis.   Cardiovascular:  Negative for chest pain, orthopnea, leg swelling and PND.  Gastrointestinal:  Negative for heartburn.  Neurological:  Negative for dizziness and loss of consciousness.      Objective:     BP 130/80 (BP Location: Left Arm, Patient Position: Sitting, Cuff Size: Normal)   Pulse 65   Temp (!) 97.5 F (36.4 C) (Oral)   Ht 6\' 1"  (1.854 m)   Wt 234 lb 3.2 oz (106.2 kg)   SpO2 97%   BMI 30.90 kg/m  BP Readings from Last 3 Encounters:  03/22/23 130/80  12/11/22 118/80  09/22/22 114/70   Wt Readings from Last 3 Encounters:  03/22/23 234 lb 3.2 oz (106.2 kg)  12/11/22 233 lb 3.2 oz (105.8 kg)  09/22/22 230 lb 9.6 oz (104.6 kg)      Physical Exam Vitals reviewed.  Cardiovascular:     Comments: Irregular rhythm but rate controlled Pulmonary:     Effort: Pulmonary effort is normal.     Breath sounds: Normal breath sounds. No wheezing or rales.  Musculoskeletal:      Cervical back: Neck supple.     Right lower leg: No edema.     Left lower leg: No edema.  Neurological:     Mental Status: He is alert.      Results for orders placed or performed in visit on 03/22/23  POC COVID-19  Result Value Ref Range   SARS Coronavirus 2 Ag Negative Negative      The ASCVD Risk score (Arnett DK, et al., 2019) failed to calculate for the following reasons:   The valid total cholesterol range is 130 to 320 mg/dL    Assessment & Plan:   #1    6-week history of intermittent cough and subjective intermittent wheezing.  No wheezing noted on exam at this time but he has had temporary  improvement with both Advair and albuterol.  Low-dose CT lung cancer screen in January no acute findings. -He had difficulty tolerating oral prednisone in the past.  We discussed Depo-Medrol 80 mg IM.  Avoid overuse of albuterol. -Consider chest x-ray if not improving -Consider pulmonary referral and possible PFTs if not improving with the above  #2 recurrent atrial fibrillation.  This was noted incidentally on exam today and EKG confirms atrial fibrillation with ventricular rate around 80.  Be in touch with cardiology regarding this -Continue Xarelto, diltiazem, and sotalol  #3 type 2 diabetes.  History of fair control.  Needs follow-up labs.  Schedule future labs with A1c and urine microalbumin screen  #4 hyperlipidemia treated with atorvastatin 40 mg daily.  Future lab order for lipids and CMP   No follow-ups on file.    Evelena Peat, MD

## 2023-03-22 NOTE — Patient Instructions (Signed)
You are back in atrial fibrillation.  I will let cardiology know.    Set up labs for tomorrow or Wednesday, if possible.

## 2023-03-24 ENCOUNTER — Other Ambulatory Visit (INDEPENDENT_AMBULATORY_CARE_PROVIDER_SITE_OTHER): Payer: Medicare Other

## 2023-03-24 DIAGNOSIS — E1142 Type 2 diabetes mellitus with diabetic polyneuropathy: Secondary | ICD-10-CM | POA: Diagnosis not present

## 2023-03-24 DIAGNOSIS — Z79899 Other long term (current) drug therapy: Secondary | ICD-10-CM

## 2023-03-24 DIAGNOSIS — E7849 Other hyperlipidemia: Secondary | ICD-10-CM

## 2023-03-24 LAB — LIPID PANEL
Cholesterol: 142 mg/dL (ref 0–200)
HDL: 39.7 mg/dL (ref >39.00)
NonHDL: 101.85
Total CHOL/HDL Ratio: 4
Triglycerides: 216 mg/dL — ABNORMAL HIGH (ref 0.0–149.0)
VLDL: 43.2 mg/dL — ABNORMAL HIGH (ref 0.0–40.0)

## 2023-03-24 LAB — COMPREHENSIVE METABOLIC PANEL
ALT: 24 U/L (ref 0–53)
AST: 15 U/L (ref 0–37)
Albumin: 4.4 g/dL (ref 3.5–5.2)
Alkaline Phosphatase: 57 U/L (ref 39–117)
BUN: 15 mg/dL (ref 6–23)
CO2: 26 mEq/L (ref 19–32)
Calcium: 9.3 mg/dL (ref 8.4–10.5)
Chloride: 101 mEq/L (ref 96–112)
Creatinine, Ser: 0.97 mg/dL (ref 0.40–1.50)
GFR: 81.52 mL/min (ref >60.00)
Glucose, Bld: 158 mg/dL — ABNORMAL HIGH (ref 70–99)
Potassium: 4.2 mEq/L (ref 3.5–5.1)
Sodium: 138 mEq/L (ref 135–145)
Total Bilirubin: 0.9 mg/dL (ref 0.2–1.2)
Total Protein: 7.3 g/dL (ref 6.0–8.3)

## 2023-03-24 LAB — CBC WITH DIFFERENTIAL/PLATELET
Basophils Absolute: 0.1 10*3/uL (ref 0.0–0.1)
Basophils Relative: 0.8 % (ref 0.0–3.0)
Eosinophils Absolute: 0.4 10*3/uL (ref 0.0–0.7)
Eosinophils Relative: 4.3 % (ref 0.0–5.0)
HCT: 48.8 % (ref 39.0–52.0)
Hemoglobin: 16.5 g/dL (ref 13.0–17.0)
Lymphocytes Relative: 22 % (ref 12.0–46.0)
Lymphs Abs: 2.1 10*3/uL (ref 0.7–4.0)
MCHC: 33.7 g/dL (ref 30.0–36.0)
MCV: 90.1 fl (ref 78.0–100.0)
Monocytes Absolute: 0.8 10*3/uL (ref 0.1–1.0)
Monocytes Relative: 8.5 % (ref 3.0–12.0)
Neutro Abs: 6.2 10*3/uL (ref 1.4–7.7)
Neutrophils Relative %: 64.4 % (ref 43.0–77.0)
Platelets: 271 10*3/uL (ref 150.0–400.0)
RBC: 5.41 Mil/uL (ref 4.22–5.81)
RDW: 13.2 % (ref 11.5–15.5)
WBC: 9.6 10*3/uL (ref 4.0–10.5)

## 2023-03-24 LAB — MICROALBUMIN / CREATININE URINE RATIO
Creatinine,U: 116.2 mg/dL
Microalb Creat Ratio: 3.5 mg/g (ref 0.0–30.0)
Microalb, Ur: 4.1 mg/dL — ABNORMAL HIGH (ref 0.0–1.9)

## 2023-03-24 LAB — HEMOGLOBIN A1C: Hgb A1c MFr Bld: 7.7 % — ABNORMAL HIGH (ref 4.6–6.5)

## 2023-03-24 LAB — LDL CHOLESTEROL, DIRECT: Direct LDL: 76 mg/dL

## 2023-04-21 ENCOUNTER — Other Ambulatory Visit: Payer: Self-pay | Admitting: Family Medicine

## 2023-04-21 ENCOUNTER — Other Ambulatory Visit (HOSPITAL_BASED_OUTPATIENT_CLINIC_OR_DEPARTMENT_OTHER): Payer: Self-pay

## 2023-04-22 ENCOUNTER — Other Ambulatory Visit (HOSPITAL_BASED_OUTPATIENT_CLINIC_OR_DEPARTMENT_OTHER): Payer: Self-pay

## 2023-04-22 MED ORDER — ALBUTEROL SULFATE HFA 108 (90 BASE) MCG/ACT IN AERS
2.0000 | INHALATION_SPRAY | RESPIRATORY_TRACT | 1 refills | Status: DC | PRN
  Filled 2023-04-22: qty 6.7, 17d supply, fill #0
  Filled 2023-06-02: qty 6.7, 17d supply, fill #1

## 2023-04-27 ENCOUNTER — Ambulatory Visit (INDEPENDENT_AMBULATORY_CARE_PROVIDER_SITE_OTHER): Payer: Medicare Other | Admitting: Family Medicine

## 2023-04-27 ENCOUNTER — Encounter: Payer: Self-pay | Admitting: Family Medicine

## 2023-04-27 ENCOUNTER — Ambulatory Visit (INDEPENDENT_AMBULATORY_CARE_PROVIDER_SITE_OTHER): Payer: Medicare Other

## 2023-04-27 VITALS — BP 116/62 | HR 67 | Temp 97.8°F | Ht 73.0 in | Wt 231.1 lb

## 2023-04-27 DIAGNOSIS — M25522 Pain in left elbow: Secondary | ICD-10-CM

## 2023-04-27 NOTE — Patient Instructions (Signed)
May have very small avulsion off Olecranon process of elbow, but not clinically significant  Continue icing as needed for swelling.

## 2023-04-27 NOTE — Progress Notes (Signed)
Established Patient Office Visit  Subjective   Patient ID: Eric Palmer, male    DOB: 04/30/1956  Age: 67 y.o. MRN: [id number]  Chief Complaint  Patient presents with   Fall    Patient complains of fall, x1 day,    Elbow Injury    Patient complains of left elbow injury, x1 day    HPI   Eric Palmer is here with left elbow injury.  This occurred over the weekend.  He was coming out of his shed and he has a ramp that was slick from some rain.  He was carrying several things in his hands and slipped and went down on his left hip and left elbow.  He has a bruise on left hip but no difficulties with ambulation.  His main concern is left elbow pain and swelling.  Able to flex and extend elbow without difficulty.  He did apply some ice afterwards.  Has had some swelling since then.  Has noted some bruising of the olecranon region.  No other injuries reported.  He has type 2 diabetes.  Recent A1c 7.7%.  We had suggested consideration for GLP-1 medication such as Ozempic but he declined.  He has follow-up in early November and we will plan to repeat A1c then.  Readdress issue of additional medication if not further to goal at that point  Past Medical History:  Diagnosis Date   Allergy    Anal fissure    Chronic bronchitis ([name])    "get it q spring and fall" (10/27/2013)   Coronary artery disease    a. s/p PCI in 1996. b. LHC 01/2006 showed 50% mid LAD, otherwise widely patent cors with minimal irregularities. c. Nuclear stress test in 04/2009 was reportedly low risk, no ischemia. // d. Nuclear stress test 2/19: EF 55, transient ST depression in recovery, no ischemia, inf defect (artifact vs scar)    DM type 2, uncontrolled, with neuropathy    ED (erectile dysfunction)    Essential hypertension    Hx of echocardiogram    Echo (10/2013): Mild LVH, moderate focal basal hypertrophy of the septum, EF 50-55%, no RWMA   Hyperlipidemia    Internal hemorrhoids without mention of complication     Obesity    Obesity    OSA on CPAP    Paresthesia of both legs    Paroxysmal atrial fibrillation ([name])    a. failed Tikosyn, Multaq. b. s/p ablation of AF/AFL in 12/2013.   Paroxysmal atrial flutter ([name])    a. s/p ablation of AF/AFL in 12/2013.   Sleep apnea    Smoker    QUIT 10/13/13   Tubular adenoma of colon 2016   Past Surgical History:  Procedure Laterality Date   ABLATION  01-12-2014   PVI and CTI by Dr Eric Palmer   ANAL FISSURE REPAIR  ~ 2010   ATRIAL FIBRILLATION ABLATION N/A 01/12/2014   Procedure: ATRIAL FIBRILLATION ABLATION;  Surgeon: Eric Rhyme, MD;  Location: [facility] CATH [facility];  Service: Cardiovascular;  Laterality: N/A;   CARDIAC CATHETERIZATION  2007   CORONARY ANGIOPLASTY WITH STENT PLACEMENT  1996   residual 50% LAD and RCA by cath in 2007   CORONARY ANGIOPLASTY WITH STENT PLACEMENT  1995   INGUINAL HERNIA REPAIR Right 1963   LEFT HEART CATH AND CORONARY ANGIOGRAPHY N/A 10/11/2017   Procedure: LEFT HEART CATH AND CORONARY ANGIOGRAPHY;  Surgeon: Eric Records, MD;  Location: [facility] IN[facility];  Service: Cardiovascular;  Laterality: N/A;  TEE WITHOUT CARDIOVERSION N/A 01/11/2014   Procedure: TRANSESOPHAGEAL ECHOCARDIOGRAM (TEE);  Surgeon: Eric Mixer, MD;  Location: [facility] ENDOSCOPY;  Service: Cardiovascular;  Laterality: N/A;   TONSILLECTOMY AND ADENOIDECTOMY  1979    reports that he quit smoking about 4 years ago. His smoking use included cigarettes. He started smoking about 47 years ago. He has a 43 pack-year smoking history. He has never used smokeless tobacco. He reports current alcohol use. He reports that he does not use drugs. family history includes Diabetes in his mother; Heart attack in his father; Heart disease in his brother and another family member; Heart disease (age of onset: 27) in his father; Heart disease (age of onset: 58) in his mother; Hyperlipidemia in an other family member; Hypertension in his mother. Allergies  Allergen Reactions   Cephalosporins  Anaphylaxis   Penicillins Anaphylaxis and Other (See Comments)    Has patient had a PCN reaction causing immediate rash, facial/tongue/throat swelling, SOB or lightheadedness with hypotension: Yes Has patient had a PCN reaction causing severe rash involving mucus membranes or skin necrosis: No Has patient had a PCN reaction that required hospitalization: No - went to doctors office Has patient had a PCN reaction occurring within the last 10 years: No If all of the above answers are "NO", then may proceed with Cephalosporin use.    Thallium Itching and Rash   Codeine Itching   Gabapentin Other (See Comments)    "made me feel drugged"   Eliquis [Apixaban] Diarrhea    Review of Systems  Musculoskeletal:  Negative for neck pain.  Neurological:  Negative for dizziness, loss of consciousness and headaches.      Objective:     BP 116/62 (BP Location: Left Arm, Patient Position: Sitting, Cuff Size: Normal)   Pulse 67   Temp 97.8 F (36.6 C) (Oral)   Ht 6\' 1"  (1.854 m)   Wt 231 lb 1.6 oz (104.8 kg)   SpO2 96%   BMI 30.49 kg/m  BP Readings from Last 3 Encounters:  04/27/23 116/62  03/22/23 130/80  12/11/22 118/80   Wt Readings from Last 3 Encounters:  04/27/23 231 lb 1.6 oz (104.8 kg)  03/22/23 234 lb 3.2 oz (106.2 kg)  12/11/22 233 lb 3.2 oz (105.8 kg)      Physical Exam Vitals reviewed.  Constitutional:      General: He is not in acute distress.    Appearance: Normal appearance.  Cardiovascular:     Rate and Rhythm: Normal rate.  Musculoskeletal:     Comments: Elbow reveals some bruising of the olecranon region.  He has mild edema over the olecranon process.  He has some tenderness to palpation near the olecranon process.  No pain with supination or pronation of the forearm.  No distal humerus tenderness. No radial head tenderness  Neurological:     Mental Status: He is alert.      No results found for any visits on 04/27/23.  Last CBC Lab Results  Component  Value Date   WBC 9.6 03/24/2023   HGB 16.5 03/24/2023   HCT 48.8 03/24/2023   MCV 90.1 03/24/2023   MCH 30.6 07/01/2020   RDW 13.2 03/24/2023   PLT 271.0 03/24/2023   Last metabolic panel Lab Results  Component Value Date   GLUCOSE 158 (H) 03/24/2023   NA 138 03/24/2023   K 4.2 03/24/2023   CL 101 03/24/2023   CO2 26 03/24/2023   BUN 15 03/24/2023   CREATININE 0.97 03/24/2023  GFR 81.52 03/24/2023   CALCIUM 9.3 03/24/2023   PROT 7.3 03/24/2023   ALBUMIN 4.4 03/24/2023   BILITOT 0.9 03/24/2023   ALKPHOS 57 03/24/2023   AST 15 03/24/2023   ALT 24 03/24/2023   ANIONGAP 7 08/15/2017   Last lipids Lab Results  Component Value Date   CHOL 142 03/24/2023   HDL 39.70 03/24/2023   LDLCALC  07/01/2020     Comment:     . LDL cholesterol not calculated. Triglyceride levels greater than 400 mg/dL invalidate calculated LDL results. . Reference range: <100 . Desirable range <100 mg/dL for primary prevention;   <70 mg/dL for patients with CHD or diabetic patients  with > or = 2 CHD risk factors. [name] LDL-C is now calculated using the Martin-Hopkins  calculation, which is a validated novel method providing  better accuracy than the Friedewald equation in the  estimation of LDL-C.  Horald Pollen et al. Lenox Ahr. 8756;433(29): 2061-2068  (http://education.[employer].com/faq/FAQ164)    LDLDIRECT 76.0 03/24/2023   TRIG 216.0 (H) 03/24/2023   CHOLHDL 4 03/24/2023   Last hemoglobin A1c Lab Results  Component Value Date   HGBA1C 7.7 (H) 03/24/2023      The 10-year ASCVD risk score (Arnett DK, et al., 2019) is: 22.3%    Assessment & Plan:   Problem List Items Addressed This Visit   None Visit Diagnoses     Left elbow pain    -  Primary   Relevant Orders   DG Elbow Complete Left     Left elbow pain following a fall.  Obtain plain films to rule out fracture. Possible small avulsion off olecranon process.  No other acute bony abnormalities noted.  This will be over  read. -He will continue with icing 2-3 times daily -He is aware olecranon swelling may persist for several days.  Follow-up immediately for any signs of infection  No follow-ups on file.    Evelena Peat, MD

## 2023-05-03 ENCOUNTER — Ambulatory Visit (INDEPENDENT_AMBULATORY_CARE_PROVIDER_SITE_OTHER): Payer: Medicare Other | Admitting: Family Medicine

## 2023-05-03 ENCOUNTER — Encounter: Payer: Self-pay | Admitting: Family Medicine

## 2023-05-03 ENCOUNTER — Other Ambulatory Visit (HOSPITAL_BASED_OUTPATIENT_CLINIC_OR_DEPARTMENT_OTHER): Payer: Self-pay

## 2023-05-03 VITALS — BP 130/76 | HR 73 | Temp 97.8°F | Ht 73.0 in | Wt 232.2 lb

## 2023-05-03 DIAGNOSIS — M25522 Pain in left elbow: Secondary | ICD-10-CM | POA: Diagnosis not present

## 2023-05-03 MED ORDER — TRAMADOL HCL 50 MG PO TABS
50.0000 mg | ORAL_TABLET | Freq: Four times a day (QID) | ORAL | 0 refills | Status: AC | PRN
Start: 2023-05-03 — End: 2023-05-08
  Filled 2023-05-03: qty 20, 5d supply, fill #0

## 2023-05-03 NOTE — Progress Notes (Signed)
Established Patient Office Visit  Subjective   Patient ID: Eric Palmer, male    DOB: [birth year]  Age: 67 y.o. MRN: [id number]  Chief Complaint  Patient presents with   Elbow Pain    Patient complains of left elbow pain,     HPI   Eric Palmer is seen for follow-up regarding left elbow and forearm injury.  Refer to prior note.  He had fallen going up a ramp into an out building.  Landed on his left arm and left hip area.  We obtained plain films which did not show any concerning acute bony injury.  He is able to move the elbow fully as well as the wrist and hand.  He is having extensive bruising but does take Xarelto regularly.  No hematoma.  He was concerned because the extent of the bruising which is more prevalent at this point than it was the other day.  No numbness or weakness in the left upper extremity.  He is left-hand dominant.  He has done some icing.  Having quite a bit of night pain.  Has tried Tylenol without much relief.  Past Medical History:  Diagnosis Date   Allergy    Anal fissure    Chronic bronchitis ([name])    "get it q spring and fall" (10/27/2013)   Coronary artery disease    a. s/p PCI in 1996. b. LHC 01/2006 showed 50% mid LAD, otherwise widely patent cors with minimal irregularities. c. Nuclear stress test in 04/2009 was reportedly low risk, no ischemia. // d. Nuclear stress test 2/19: EF 55, transient ST depression in recovery, no ischemia, inf defect (artifact vs scar)    DM type 2, uncontrolled, with neuropathy    ED (erectile dysfunction)    Essential hypertension    Hx of echocardiogram    Echo (10/2013): Mild LVH, moderate focal basal hypertrophy of the septum, EF 50-55%, no RWMA   Hyperlipidemia    Internal hemorrhoids without mention of complication    Obesity    Obesity    OSA on CPAP    Paresthesia of both legs    Paroxysmal atrial fibrillation ([name])    a. failed Tikosyn, Multaq. b. s/p ablation of AF/AFL in 12/2013.   Paroxysmal atrial flutter ([name])     a. s/p ablation of AF/AFL in 12/2013.   Sleep apnea    Smoker    QUIT 10/13/13   Tubular adenoma of colon 2016   Past Surgical History:  Procedure Laterality Date   ABLATION  01-12-2014   PVI and CTI by Dr Eric Palmer   ANAL FISSURE REPAIR  ~ 2010   ATRIAL FIBRILLATION ABLATION N/A 01/12/2014   Procedure: ATRIAL FIBRILLATION ABLATION;  Surgeon: Eric Rhyme, MD;  Location: [facility] CATH [facility];  Service: Cardiovascular;  Laterality: N/A;   CARDIAC CATHETERIZATION  2007   CORONARY ANGIOPLASTY WITH STENT PLACEMENT  1996   residual 50% LAD and RCA by cath in 2007   CORONARY ANGIOPLASTY WITH STENT PLACEMENT  1995   INGUINAL HERNIA REPAIR Right 1963   LEFT HEART CATH AND CORONARY ANGIOGRAPHY N/A 10/11/2017   Procedure: LEFT HEART CATH AND CORONARY ANGIOGRAPHY;  Surgeon: Eric Records, MD;  Location: [facility] IN[facility];  Service: Cardiovascular;  Laterality: N/A;   TEE WITHOUT CARDIOVERSION N/A 01/11/2014   Procedure: TRANSESOPHAGEAL ECHOCARDIOGRAM (TEE);  Surgeon: Eric Mixer, MD;  Location: [facility] ENDOSCOPY;  Service: Cardiovascular;  Laterality: N/A;   TONSILLECTOMY AND ADENOIDECTOMY  1979    reports that he  quit smoking about 4 years ago. His smoking use included cigarettes. He started smoking about 47 years ago. He has a 43 pack-year smoking history. He has never used smokeless tobacco. He reports current alcohol use. He reports that he does not use drugs. family history includes Diabetes in his mother; Heart attack in his father; Heart disease in his brother and another family member; Heart disease (age of onset: 62) in his father; Heart disease (age of onset: 92) in his mother; Hyperlipidemia in an other family member; Hypertension in his mother. Allergies  Allergen Reactions   Cephalosporins Anaphylaxis   Penicillins Anaphylaxis and Other (See Comments)    Has patient had a PCN reaction causing immediate rash, facial/tongue/throat swelling, SOB or lightheadedness with hypotension: Yes Has patient had  a PCN reaction causing severe rash involving mucus membranes or skin necrosis: No Has patient had a PCN reaction that required hospitalization: No - went to doctors office Has patient had a PCN reaction occurring within the last 10 years: No If all of the above answers are "NO", then may proceed with Cephalosporin use.    Thallium Itching and Rash   Codeine Itching   Gabapentin Other (See Comments)    "made me feel drugged"   Eliquis [Apixaban] Diarrhea    Review of Systems  Neurological:  Negative for tingling, focal weakness and weakness.      Objective:     BP 130/76 (BP Location: Left Arm, Patient Position: Sitting, Cuff Size: Normal)   Pulse 73   Temp 97.8 F (36.6 C) (Oral)   Ht 6\' 1"  (1.854 m)   Wt 232 lb 3.2 oz (105.3 kg)   SpO2 98%   BMI 30.64 kg/m  BP Readings from Last 3 Encounters:  05/03/23 130/76  04/27/23 116/62  03/22/23 130/80   Wt Readings from Last 3 Encounters:  05/03/23 232 lb 3.2 oz (105.3 kg)  04/27/23 231 lb 1.6 oz (104.8 kg)  03/22/23 234 lb 3.2 oz (106.2 kg)      Physical Exam Vitals reviewed.  Constitutional:      Appearance: Normal appearance.  Cardiovascular:     Rate and Rhythm: Normal rate and regular rhythm.  Musculoskeletal:     Comments: Extensive bruising involving the left upper extremity from several centimeters above the elbow region on the arm down toward the wrist.  Location is medial aspect.  No palpated hematoma.  No localized bony tenderness.  Full range of motion left elbow and wrist.  Does have some localized swelling of the olecranon bursa but no erythema.  Only minimal warmth.  Neurological:     Mental Status: He is alert.     Comments: Full grip strength bilaterally.  He has good strength with wrist flexion and extension bilaterally.  No sensory impairment.      No results found for any visits on 05/03/23.    The 10-year ASCVD risk score (Arnett DK, et al., 2019) is: 26.6%    Assessment & Plan:   Recent  fall with extensive bruising left upper extremity.  Patient is on Xarelto.  No hematoma.  No major acute bony abnormality noted on recent plain films.  X-ray still to be over read.  No evidence for bursa infection .  he has good range of motion of extremity.  He is aware bruising will take several weeks to resolve.  We did send in some limited tramadol 1 every 6 hours as needed for pain and may supplement with Tylenol as needed.   Smitty Cords  Caryl Never, MD

## 2023-05-03 NOTE — Patient Instructions (Signed)
I will send in some Tramadol to take as needed for pain.

## 2023-05-19 ENCOUNTER — Other Ambulatory Visit: Payer: Self-pay

## 2023-05-20 ENCOUNTER — Encounter: Payer: Self-pay | Admitting: Family Medicine

## 2023-05-20 DIAGNOSIS — Z0110 Encounter for examination of ears and hearing without abnormal findings: Secondary | ICD-10-CM

## 2023-05-25 DIAGNOSIS — E119 Type 2 diabetes mellitus without complications: Secondary | ICD-10-CM | POA: Diagnosis not present

## 2023-05-29 ENCOUNTER — Other Ambulatory Visit (HOSPITAL_BASED_OUTPATIENT_CLINIC_OR_DEPARTMENT_OTHER): Payer: Self-pay

## 2023-06-07 ENCOUNTER — Encounter: Payer: Self-pay | Admitting: Family Medicine

## 2023-06-07 ENCOUNTER — Ambulatory Visit (INDEPENDENT_AMBULATORY_CARE_PROVIDER_SITE_OTHER): Payer: Medicare Other | Admitting: Family Medicine

## 2023-06-07 ENCOUNTER — Other Ambulatory Visit (HOSPITAL_BASED_OUTPATIENT_CLINIC_OR_DEPARTMENT_OTHER): Payer: Self-pay

## 2023-06-07 ENCOUNTER — Other Ambulatory Visit (INDEPENDENT_AMBULATORY_CARE_PROVIDER_SITE_OTHER): Payer: Medicare Other

## 2023-06-07 VITALS — BP 110/80 | HR 73 | Temp 97.5°F | Ht 73.0 in | Wt 225.7 lb

## 2023-06-07 DIAGNOSIS — R110 Nausea: Secondary | ICD-10-CM

## 2023-06-07 DIAGNOSIS — R066 Hiccough: Secondary | ICD-10-CM

## 2023-06-07 DIAGNOSIS — R197 Diarrhea, unspecified: Secondary | ICD-10-CM | POA: Diagnosis not present

## 2023-06-07 DIAGNOSIS — R1011 Right upper quadrant pain: Secondary | ICD-10-CM

## 2023-06-07 LAB — COMPREHENSIVE METABOLIC PANEL
ALT: 24 U/L (ref 0–53)
AST: 13 U/L (ref 0–37)
Albumin: 4.4 g/dL (ref 3.5–5.2)
Alkaline Phosphatase: 51 U/L (ref 39–117)
BUN: 16 mg/dL (ref 6–23)
CO2: 28 meq/L (ref 19–32)
Calcium: 9.6 mg/dL (ref 8.4–10.5)
Chloride: 102 meq/L (ref 96–112)
Creatinine, Ser: 1.08 mg/dL (ref 0.40–1.50)
GFR: 71.56 mL/min (ref >60.00)
Glucose, Bld: 124 mg/dL — ABNORMAL HIGH (ref 70–99)
Potassium: 4.5 meq/L (ref 3.5–5.1)
Sodium: 138 meq/L (ref 135–145)
Total Bilirubin: 0.8 mg/dL (ref 0.2–1.2)
Total Protein: 7.3 g/dL (ref 6.0–8.3)

## 2023-06-07 LAB — CBC WITH DIFFERENTIAL/PLATELET
Basophils Absolute: 0.1 10*3/uL (ref 0.0–0.1)
Basophils Relative: 0.8 % (ref 0.0–3.0)
Eosinophils Absolute: 0.4 10*3/uL (ref 0.0–0.7)
Eosinophils Relative: 4.8 % (ref 0.0–5.0)
HCT: 49.9 % (ref 39.0–52.0)
Hemoglobin: 16.6 g/dL (ref 13.0–17.0)
Lymphocytes Relative: 30 % (ref 12.0–46.0)
Lymphs Abs: 2.6 10*3/uL (ref 0.7–4.0)
MCHC: 33.4 g/dL (ref 30.0–36.0)
MCV: 91.6 fL (ref 78.0–100.0)
Monocytes Absolute: 0.7 10*3/uL (ref 0.1–1.0)
Monocytes Relative: 8.4 % (ref 3.0–12.0)
Neutro Abs: 4.9 10*3/uL (ref 1.4–7.7)
Neutrophils Relative %: 56 % (ref 43.0–77.0)
Platelets: 285 10*3/uL (ref 150.0–400.0)
RBC: 5.44 Mil/uL (ref 4.22–5.81)
RDW: 13.3 % (ref 11.5–15.5)
WBC: 8.7 10*3/uL (ref 4.0–10.5)

## 2023-06-07 LAB — LIPASE: Lipase: 23 U/L (ref 11.0–59.0)

## 2023-06-07 MED ORDER — ONDANSETRON HCL 8 MG PO TABS
8.0000 mg | ORAL_TABLET | Freq: Three times a day (TID) | ORAL | 0 refills | Status: DC | PRN
  Filled 2023-06-07: qty 15, 5d supply, fill #0

## 2023-06-07 NOTE — Progress Notes (Signed)
Established Patient Office Visit  Subjective   Patient ID: Eric Palmer, male    DOB: 1956-02-12  Age: 67 y.o. MRN: [id number]  Chief Complaint  Patient presents with   Abdominal Pain    Patient complains of abdominal pain,    Diarrhea    Patient complains of diarrhea, x1 week    Nausea    Patient complains of nausea, x2 weeks    Hiccups    Patient complains of hiccups, x1 week    Dizziness    Patient complains of dizziness, x1 day   Eric Palmer is seen today with multiple symptoms including recent abdominal pain, diarrhea, nausea without vomiting, persistent hiccups, and 1 day history of some dizziness.  He states he was at a convention in [place] and when he was out there developed the symptoms as above.  He had several days of severe hiccups and finally these have resolved.  He had persistent nausea for 2 weeks without vomiting.  Had some diarrhea for about a week and still has some loose stools but no longer watery diarrhea.  Never bloody stools.  No associated fever.  He also describes some frequent abdominal pain right upper quadrant radiating toward the back.  No known history of gallstones.  Ate very little when he was in [place].  Mostly drink milk and only really had 1 real meal.  No recent antibiotic use.  He states after his hiccups resolved he had some difficulty swallowing initially.  Eating soup at this time with no obstructive symptoms.  He denies any exertional chest pains.  No dyspnea.  Past Medical History:  Diagnosis Date   Allergy    Anal fissure    Chronic bronchitis ([name])    "get it q spring and fall" (10/27/2013)   Coronary artery disease    a. s/p PCI in 1996. b. LHC 01/2006 showed 50% mid LAD, otherwise widely patent cors with minimal irregularities. c. Nuclear stress test in 04/2009 was reportedly low risk, no ischemia. // d. Nuclear stress test 2/19: EF 55, transient ST depression in recovery, no ischemia, inf defect (artifact vs scar)    DM type 2, uncontrolled,  with neuropathy    ED (erectile dysfunction)    Essential hypertension    Hx of echocardiogram    Echo (10/2013): Mild LVH, moderate focal basal hypertrophy of the septum, EF 50-55%, no RWMA   Hyperlipidemia    Internal hemorrhoids without mention of complication    Obesity    Obesity    OSA on CPAP    Paresthesia of both legs    Paroxysmal atrial fibrillation ([name])    a. failed Tikosyn, Multaq. b. s/p ablation of AF/AFL in 12/2013.   Paroxysmal atrial flutter ([name])    a. s/p ablation of AF/AFL in 12/2013.   Sleep apnea    Smoker    QUIT 10/13/13   Tubular adenoma of colon 2016   Past Surgical History:  Procedure Laterality Date   ABLATION  01-12-2014   PVI and CTI by Dr Johney Frame   ANAL FISSURE REPAIR  ~ 2010   ATRIAL FIBRILLATION ABLATION N/A 01/12/2014   Procedure: ATRIAL FIBRILLATION ABLATION;  Surgeon: Gardiner Rhyme, MD;  Location: [facility] CATH [facility];  Service: Cardiovascular;  Laterality: N/A;   CARDIAC CATHETERIZATION  2007   CORONARY ANGIOPLASTY WITH STENT PLACEMENT  1996   residual 50% LAD and RCA by cath in 2007   CORONARY ANGIOPLASTY WITH STENT PLACEMENT  1995   INGUINAL HERNIA REPAIR Right  1963   LEFT HEART CATH AND CORONARY ANGIOGRAPHY N/A 10/11/2017   Procedure: LEFT HEART CATH AND CORONARY ANGIOGRAPHY;  Surgeon: Lyn Records, MD;  Location: [facility] INVAS[facility] LAB;  Service: Cardiovascular;  Laterality: N/A;   TEE WITHOUT CARDIOVERSION N/A 01/11/2014   Procedure: TRANSESOPHAGEAL ECHOCARDIOGRAM (TEE);  Surgeon: Vesta Mixer, MD;  Location: [facility] ENDOSCOPY;  Service: Cardiovascular;  Laterality: N/A;   TONSILLECTOMY AND ADENOIDECTOMY  1979    reports that he quit smoking about 4 years ago. His smoking use included cigarettes. He started smoking about 47 years ago. He has a 43 pack-year smoking history. He has never used smokeless tobacco. He reports current alcohol use. He reports that he does not use drugs. family history includes Diabetes in his mother; Heart attack in his father;  Heart disease in his brother and another family member; Heart disease (age of onset: 34) in his father; Heart disease (age of onset: 22) in his mother; Hyperlipidemia in an other family member; Hypertension in his mother. Allergies  Allergen Reactions   Cephalosporins Anaphylaxis   Penicillins Anaphylaxis and Other (See Comments)    Has patient had a PCN reaction causing immediate rash, facial/tongue/throat swelling, SOB or lightheadedness with hypotension: Yes Has patient had a PCN reaction causing severe rash involving mucus membranes or skin necrosis: No Has patient had a PCN reaction that required hospitalization: No - went to doctors office Has patient had a PCN reaction occurring within the last 10 years: No If all of the above answers are "NO", then may proceed with Cephalosporin use.    Thallium Itching and Rash   Codeine Itching   Gabapentin Other (See Comments)    "made me feel drugged"   Eliquis [Apixaban] Diarrhea    HPI    Review of Systems  Constitutional:  Negative for chills and fever.  Respiratory:  Negative for cough and shortness of breath.   Gastrointestinal:  Positive for abdominal pain, diarrhea and nausea. Negative for blood in stool, constipation, melena and vomiting.  Genitourinary:  Negative for dysuria.      Objective:     BP 110/80 (BP Location: Left Arm, Patient Position: Sitting, Cuff Size: Normal)   Pulse 73   Temp (!) 97.5 F (36.4 C) (Oral)   Ht 6\' 1"  (1.854 m)   Wt 225 lb 11.2 oz (102.4 kg)   SpO2 97%   BMI 29.78 kg/m  BP Readings from Last 3 Encounters:  06/07/23 110/80  05/03/23 130/76  04/27/23 116/62   Wt Readings from Last 3 Encounters:  06/07/23 225 lb 11.2 oz (102.4 kg)  05/03/23 232 lb 3.2 oz (105.3 kg)  04/27/23 231 lb 1.6 oz (104.8 kg)      Physical Exam Vitals reviewed.  Constitutional:      General: He is not in acute distress.    Appearance: He is well-developed. He is not ill-appearing.  Cardiovascular:      Rate and Rhythm: Normal rate and regular rhythm.  Pulmonary:     Effort: Pulmonary effort is normal.     Breath sounds: Normal breath sounds.  Abdominal:     Palpations: There is no hepatomegaly, splenomegaly or mass.     Comments: Abdomen nondistended.  Normal bowel sounds.  Soft with some right upper quadrant tenderness to deep palpation.  No guarding or rebound.  Neurological:     Mental Status: He is alert.      No results found for any visits on 06/07/23.    The 10-year ASCVD risk  score (Arnett DK, et al., 2019) is: 20.5%    Assessment & Plan:   Problem List Items Addressed This Visit   None Visit Diagnoses     Abdominal pain, RUQ    -  Primary   Relevant Orders   CBC with Differential/Platelet   CMP   Lipase   US Abdomen Complete     Eric Palmer presents with recent 2-week history of nausea without vomiting, intermittent right upper quadrant pain rating to the back, intermittent hiccups which have finally resolved, nonspecific lightheadedness today, and resolving nonbloody diarrhea.  Recent travel to [facility] [place].  Etiology unclear.  Nonfocal exam today but does have some mild right upper quadrant tenderness to palpation.  Nontoxic in appearance.  No recent fever reported.  We discussed the following  -In view of his recent right upper quadrant pain and persistent nausea obtain ultrasound abdomen and pelvis to further assess. -Check CBC, CMP, lipase -Bland diet -Follow-up immediately for any fever, recurrent vomiting, or worsening pain.  Also be in touch if he has any recurrent hiccups.    No follow-ups on file.    Evelena Peat, MD

## 2023-06-15 DIAGNOSIS — H04123 Dry eye syndrome of bilateral lacrimal glands: Secondary | ICD-10-CM | POA: Diagnosis not present

## 2023-06-15 DIAGNOSIS — E119 Type 2 diabetes mellitus without complications: Secondary | ICD-10-CM | POA: Diagnosis not present

## 2023-06-15 DIAGNOSIS — H35363 Drusen (degenerative) of macula, bilateral: Secondary | ICD-10-CM | POA: Diagnosis not present

## 2023-06-15 DIAGNOSIS — H2513 Age-related nuclear cataract, bilateral: Secondary | ICD-10-CM | POA: Diagnosis not present

## 2023-06-15 LAB — HM DIABETES EYE EXAM

## 2023-06-17 ENCOUNTER — Ambulatory Visit
Admission: RE | Admit: 2023-06-17 | Discharge: 2023-06-17 | Disposition: A | Discharge status: Home / Self Care | Payer: Medicare Other | Source: Ambulatory Visit | Attending: Family Medicine | Admitting: Family Medicine

## 2023-06-17 DIAGNOSIS — R1011 Right upper quadrant pain: Secondary | ICD-10-CM | POA: Diagnosis not present

## 2023-06-17 DIAGNOSIS — K760 Fatty (change of) liver, not elsewhere classified: Secondary | ICD-10-CM | POA: Diagnosis not present

## 2023-06-17 DIAGNOSIS — R110 Nausea: Secondary | ICD-10-CM | POA: Diagnosis not present

## 2023-06-19 ENCOUNTER — Other Ambulatory Visit (HOSPITAL_BASED_OUTPATIENT_CLINIC_OR_DEPARTMENT_OTHER): Payer: Self-pay

## 2023-06-19 ENCOUNTER — Other Ambulatory Visit: Payer: Self-pay | Admitting: Family Medicine

## 2023-06-19 DIAGNOSIS — E1142 Type 2 diabetes mellitus with diabetic polyneuropathy: Secondary | ICD-10-CM

## 2023-06-21 ENCOUNTER — Other Ambulatory Visit (HOSPITAL_BASED_OUTPATIENT_CLINIC_OR_DEPARTMENT_OTHER): Payer: Self-pay

## 2023-06-21 MED ORDER — ALBUTEROL SULFATE HFA 108 (90 BASE) MCG/ACT IN AERS
2.0000 | INHALATION_SPRAY | RESPIRATORY_TRACT | 1 refills | Status: DC | PRN
  Filled 2023-06-21: qty 6.7, 17d supply, fill #0
  Filled 2023-07-16: qty 6.7, 17d supply, fill #1

## 2023-06-21 MED ORDER — METFORMIN HCL ER 750 MG PO TB24
750.0000 mg | ORAL_TABLET | Freq: Two times a day (BID) | ORAL | 1 refills | Status: DC
  Filled 2023-06-21: qty 180, 90d supply, fill #0
  Filled 2023-09-16: qty 180, 90d supply, fill #1

## 2023-06-21 MED ORDER — EMPAGLIFLOZIN 25 MG PO TABS
25.0000 mg | ORAL_TABLET | Freq: Every day | ORAL | 1 refills | Status: DC
  Filled 2023-06-21: qty 90, 90d supply, fill #0
  Filled 2023-09-16: qty 90, 90d supply, fill #1

## 2023-06-24 ENCOUNTER — Ambulatory Visit: Payer: Medicare Other | Attending: Family Medicine | Admitting: Audiologist

## 2023-06-28 ENCOUNTER — Ambulatory Visit (INDEPENDENT_AMBULATORY_CARE_PROVIDER_SITE_OTHER): Payer: Medicare Other | Admitting: Family Medicine

## 2023-06-28 VITALS — BP 124/70 | HR 72 | Temp 97.8°F | Ht 73.0 in | Wt 225.0 lb

## 2023-06-28 DIAGNOSIS — Z7984 Long term (current) use of oral hypoglycemic drugs: Secondary | ICD-10-CM

## 2023-06-28 DIAGNOSIS — I10 Essential (primary) hypertension: Secondary | ICD-10-CM

## 2023-06-28 DIAGNOSIS — R110 Nausea: Secondary | ICD-10-CM | POA: Diagnosis not present

## 2023-06-28 DIAGNOSIS — E1142 Type 2 diabetes mellitus with diabetic polyneuropathy: Secondary | ICD-10-CM

## 2023-06-28 LAB — POCT GLYCOSYLATED HEMOGLOBIN (HGB A1C): Hemoglobin A1C: 7 % — AB (ref 4.0–5.6)

## 2023-06-28 NOTE — Progress Notes (Signed)
Established Patient Office Visit  Subjective   Patient ID: Eric Palmer, male    DOB: 15-May-1956  Age: 67 y.o. MRN: [id number]  Chief Complaint  Patient presents with   Medical Management of Chronic Issues    HPI   Eric Palmer is seen for medical follow-up.  He has history of type 2 diabetes, hypertension, CAD, atrial fibrillation, obstructive sleep apnea.  He has made some dietary changes and has lost almost 10 pounds since his last visit.  He does have some persistent chronic nausea.  Recent lab work including CBC, CMP, lipase unrevealing.  Ultrasound obtained but still waiting on reading.  Still has occasional pains right upper quadrant. Has decent appetite.  Denies any stool changes.  He does have history of frequent belching especially in the morning and nausea seem to be worse early morning.  Previously took Prilosec up until couple weeks ago but did not seem to help his nausea any.  No chronic headaches.  No recent chest pains.  Last visit A1c was 7.7%.  We discussed possible additional medications but he preferred lifestyle management first.  He does remain on metformin and Jardiance.  We had previously discussed possible GLP-1 medication prior to his onset of nausea but he never started that.  Past Medical History:  Diagnosis Date   Allergy    Anal fissure    Chronic bronchitis ([name])    "get it q spring and fall" (10/27/2013)   Coronary artery disease    a. s/p PCI in 1996. b. LHC 01/2006 showed 50% mid LAD, otherwise widely patent cors with minimal irregularities. c. Nuclear stress test in 04/2009 was reportedly low risk, no ischemia. // d. Nuclear stress test 2/19: EF 55, transient ST depression in recovery, no ischemia, inf defect (artifact vs scar)    DM type 2, uncontrolled, with neuropathy    ED (erectile dysfunction)    Essential hypertension    Hx of echocardiogram    Echo (10/2013): Mild LVH, moderate focal basal hypertrophy of the septum, EF 50-55%, no RWMA   Hyperlipidemia     Internal hemorrhoids without mention of complication    Obesity    Obesity    OSA on CPAP    Paresthesia of both legs    Paroxysmal atrial fibrillation ([name])    a. failed Tikosyn, Multaq. b. s/p ablation of AF/AFL in 12/2013.   Paroxysmal atrial flutter ([name])    a. s/p ablation of AF/AFL in 12/2013.   Sleep apnea    Smoker    QUIT 10/13/13   Tubular adenoma of colon 2016   Past Surgical History:  Procedure Laterality Date   ABLATION  01-12-2014   PVI and CTI by Dr Johney Frame   ANAL FISSURE REPAIR  ~ 2010   ATRIAL FIBRILLATION ABLATION N/A 01/12/2014   Procedure: ATRIAL FIBRILLATION ABLATION;  Surgeon: Gardiner Rhyme, MD;  Location: [facility];  Service: Cardiovascular;  Laterality: N/A;   CARDIAC CATHETERIZATION  2007   CORONARY ANGIOPLASTY WITH STENT PLACEMENT  1996   residual 50% LAD and RCA by cath in 2007   CORONARY ANGIOPLASTY WITH STENT PLACEMENT  1995   INGUINAL HERNIA REPAIR Right 1963   LEFT HEART CATH AND CORONARY ANGIOGRAPHY N/A 10/11/2017   Procedure: LEFT HEART CATH AND CORONARY ANGIOGRAPHY;  Surgeon: Lyn Records, MD;  Location: [facility];  Service: Cardiovascular;  Laterality: N/A;   TEE WITHOUT CARDIOVERSION N/A 01/11/2014   Procedure: TRANSESOPHAGEAL ECHOCARDIOGRAM (TEE);  Surgeon: Vesta Mixer, MD;  Location: [facility];  Service: Cardiovascular;  Laterality: N/A;   TONSILLECTOMY AND ADENOIDECTOMY  1979    reports that he quit smoking about 4 years ago. His smoking use included cigarettes. He started smoking about 47 years ago. He has a 43 pack-year smoking history. He has never used smokeless tobacco. He reports current alcohol use. He reports that he does not use drugs. family history includes Diabetes in his mother; Heart attack in his father; Heart disease in his brother and another family member; Heart disease (age of onset: 74) in his father; Heart disease (age of onset: 55) in his mother; Hyperlipidemia in an other family member; Hypertension in his  mother. Allergies  Allergen Reactions   Cephalosporins Anaphylaxis   Penicillins Anaphylaxis and Other (See Comments)    Has patient had a PCN reaction causing immediate rash, facial/tongue/throat swelling, SOB or lightheadedness with hypotension: Yes Has patient had a PCN reaction causing severe rash involving mucus membranes or skin necrosis: No Has patient had a PCN reaction that required hospitalization: No - went to doctors office Has patient had a PCN reaction occurring within the last 10 years: No If all of the above answers are "NO", then may proceed with Cephalosporin use.    Thallium Itching and Rash   Codeine Itching   Gabapentin Other (See Comments)    "made me feel drugged"   Eliquis [Apixaban] Diarrhea    Review of Systems  Constitutional:  Negative for chills, fever and malaise/fatigue.  Eyes:  Negative for blurred vision.  Respiratory:  Negative for shortness of breath.   Cardiovascular:  Negative for chest pain.  Gastrointestinal:  Positive for nausea. Negative for blood in stool, constipation, diarrhea, melena and vomiting.  Neurological:  Negative for dizziness, weakness and headaches.      Objective:     BP 124/70 (BP Location: Left Arm, Patient Position: Sitting, Cuff Size: Normal)   Pulse 72   Temp 97.8 F (36.6 C) (Oral)   Ht 6\' 1"  (1.854 m)   Wt 225 lb (102.1 kg)   SpO2 95%   BMI 29.69 kg/m  BP Readings from Last 3 Encounters:  06/28/23 124/70  06/07/23 110/80  05/03/23 130/76   Wt Readings from Last 3 Encounters:  06/28/23 225 lb (102.1 kg)  06/07/23 225 lb 11.2 oz (102.4 kg)  05/03/23 232 lb 3.2 oz (105.3 kg)      Physical Exam Vitals reviewed.  Constitutional:      General: He is not in acute distress.    Appearance: He is not ill-appearing.  Cardiovascular:     Rate and Rhythm: Normal rate and regular rhythm.  Pulmonary:     Effort: Pulmonary effort is normal.     Breath sounds: Normal breath sounds. No wheezing or rales.   Abdominal:     Palpations: Abdomen is soft.     Tenderness: There is no abdominal tenderness.  Neurological:     Mental Status: He is alert.      Results for orders placed or performed in visit on 06/28/23  POC HgB A1c  Result Value Ref Range   Hemoglobin A1C 7.0 (A) 4.0 - 5.6 %   HbA1c POC (<> result, manual entry)     HbA1c, POC (prediabetic range)     HbA1c, POC (controlled diabetic range)      Last CBC Lab Results  Component Value Date   WBC 8.7 06/07/2023   HGB 16.6 06/07/2023   HCT 49.9 06/07/2023   MCV 91.6 06/07/2023  MCH 30.6 07/01/2020   RDW 13.3 06/07/2023   PLT 285.0 06/07/2023   Last metabolic panel Lab Results  Component Value Date   GLUCOSE 124 (H) 06/07/2023   NA 138 06/07/2023   K 4.5 06/07/2023   CL 102 06/07/2023   CO2 28 06/07/2023   BUN 16 06/07/2023   CREATININE 1.08 06/07/2023   GFR 71.56 06/07/2023   CALCIUM 9.6 06/07/2023   PROT 7.3 06/07/2023   ALBUMIN 4.4 06/07/2023   BILITOT 0.8 06/07/2023   ALKPHOS 51 06/07/2023   AST 13 06/07/2023   ALT 24 06/07/2023   ANIONGAP 7 08/15/2017   Last hemoglobin A1c Lab Results  Component Value Date   HGBA1C 7.0 (A) 06/28/2023      The 10-year ASCVD risk score (Arnett DK, et al., 2019) is: 24.7%    Assessment & Plan:   #1 type 2 diabetes improved with A1c 7.0%.  This is a reduction from 7.7% last visit.  He had some intentional weight loss with dietary changes.  Will continue current regimen metformin and Jardiance and reassess in about 4 months We did discuss possible continuous glucose monitor but he we will continue with regular glucose monitor for now.  #2 hypertension stable on Cardizem.  Continue weight loss/weight control measures.  #3 nausea.  Minimal abdominal pain which is intermittent right upper quadrant.  Ultrasound pending.  Recent labs reassuring.  He is had some intentional weight loss.  Symptoms did not improve with acid reducer of over-the-counter Prilosec.  If ultrasound  unrevealing and symptoms persist consider GI referral   Return in about 4 months (around 10/26/2023).    Evelena Peat, MD

## 2023-06-28 NOTE — Patient Instructions (Signed)
A1C is improved to 7.0%  Keep up the good work.  Let's plan on 3-4 month follow up.

## 2023-07-12 ENCOUNTER — Other Ambulatory Visit: Payer: Self-pay | Admitting: Family Medicine

## 2023-07-12 DIAGNOSIS — R110 Nausea: Secondary | ICD-10-CM

## 2023-07-28 ENCOUNTER — Ambulatory Visit: Payer: Medicare Other | Attending: Family Medicine | Admitting: Audiologist

## 2023-07-28 DIAGNOSIS — H9313 Tinnitus, bilateral: Secondary | ICD-10-CM | POA: Diagnosis not present

## 2023-07-28 DIAGNOSIS — H903 Sensorineural hearing loss, bilateral: Secondary | ICD-10-CM | POA: Insufficient documentation

## 2023-07-28 NOTE — Procedures (Signed)
  Outpatient Audiology and [facility] [address] [place], [place]  [address] [phone]  AUDIOLOGICAL  EVALUATION  NAME: Eric Palmer     DOB:   October 09, 1955      MRN: [id number]                                                                                     DATE: 07/28/2023     REFERENT: Kristian Covey, MD STATUS: Outpatient DIAGNOSIS:  Sensorineural Hearing Loss   History: [name] was seen for an audiological evaluation due to tinnitus.  [name] denies pain or pressure in either ear. He has a static sounding tinnitus in both ears. It is getting worse and is now bothersome. He feels he hears well. He only struggles occasionally in noise and when other people are mumbling.  [name] no history of hazardous noise exposure.  Medical history shows diabetes with is a risk for hearing loss.   Evaluation:  Otoscopy showed a clear view of the tympanic membranes, bilaterally Tympanometry results were consistent with normal middle ear function, bilaterally   Audiometric testing was completed using Conventional Audiometry techniques with insert earphones and supraural headphones. Test results are consistent with mild sloping to moderate sensorineural hearing loss bilaterally. Speech Recognition Thresholds were obtained at  45dB HL in the right ear and at 45dB HL in the left ear. Word Recognition Testing was completed at  40dB SL and [name] scored 92% in the right ear and 100% in the left ear.   Results:  The test results were reviewed with [name]. He has sloping high pitched sensorineural hearing loss in both ears. The loss is most likely what is causing the static sounding tinnitus. He needs hearing aids for both ears. He is not interested in pursuing aids at this time. He may try over the counter aids. He feels he hears well, he is just bothered by the tinnitus. He was advised on how hearing aids can help reduce the tinnitus awareness.  Audiogram printed and provided to  [employer].    Recommendations: Hearing aids recommended for both ears. Patient given list of local hearing aid providers and OTC hearing aid handout.  If tinnitus worsens then recommend tinnitus evaluation with Dr. Cammy Copa at [facility]. However her recommendation will likely also be use of hearing aids.  Annual audiometric testing recommended to monitor hearing loss for progression.    32 minutes spent testing and counseling on results.   If you have any questions please feel free to contact me at [phone].  Ammie Ferrier Au.D.  Audiologist   07/28/2023  3:33 PM  Cc: Kristian Covey, MD

## 2023-08-02 DIAGNOSIS — K080 Exfoliation of teeth due to systemic causes: Secondary | ICD-10-CM | POA: Diagnosis not present

## 2023-08-03 ENCOUNTER — Other Ambulatory Visit: Payer: Self-pay | Admitting: Acute Care

## 2023-08-03 DIAGNOSIS — Z87891 Personal history of nicotine dependence: Secondary | ICD-10-CM

## 2023-08-03 DIAGNOSIS — Z122 Encounter for screening for malignant neoplasm of respiratory organs: Secondary | ICD-10-CM

## 2023-08-14 ENCOUNTER — Other Ambulatory Visit: Payer: Self-pay | Admitting: Family Medicine

## 2023-08-16 ENCOUNTER — Other Ambulatory Visit (HOSPITAL_BASED_OUTPATIENT_CLINIC_OR_DEPARTMENT_OTHER): Payer: Self-pay

## 2023-08-16 MED ORDER — ONDANSETRON HCL 8 MG PO TABS
8.0000 mg | ORAL_TABLET | Freq: Three times a day (TID) | ORAL | 0 refills | Status: DC | PRN
  Filled 2023-08-16: qty 15, 5d supply, fill #0

## 2023-08-19 ENCOUNTER — Other Ambulatory Visit: Payer: Self-pay | Admitting: Family Medicine

## 2023-08-23 ENCOUNTER — Other Ambulatory Visit (HOSPITAL_BASED_OUTPATIENT_CLINIC_OR_DEPARTMENT_OTHER): Payer: Self-pay

## 2023-08-23 MED ORDER — ALBUTEROL SULFATE HFA 108 (90 BASE) MCG/ACT IN AERS
2.0000 | INHALATION_SPRAY | RESPIRATORY_TRACT | 1 refills | Status: DC | PRN
  Filled 2023-08-23: qty 6.7, 17d supply, fill #0
  Filled 2023-10-02: qty 6.7, 17d supply, fill #1

## 2023-08-31 ENCOUNTER — Ambulatory Visit
Admission: RE | Admit: 2023-08-31 | Discharge: 2023-08-31 | Disposition: A | Discharge status: Home / Self Care | Payer: Medicare Other | Source: Ambulatory Visit | Attending: Acute Care | Admitting: Acute Care

## 2023-08-31 DIAGNOSIS — Z87891 Personal history of nicotine dependence: Secondary | ICD-10-CM

## 2023-08-31 DIAGNOSIS — Z122 Encounter for screening for malignant neoplasm of respiratory organs: Secondary | ICD-10-CM

## 2023-09-04 ENCOUNTER — Other Ambulatory Visit (HOSPITAL_BASED_OUTPATIENT_CLINIC_OR_DEPARTMENT_OTHER): Payer: Self-pay

## 2023-09-06 ENCOUNTER — Other Ambulatory Visit (HOSPITAL_BASED_OUTPATIENT_CLINIC_OR_DEPARTMENT_OTHER): Payer: Self-pay

## 2023-09-06 ENCOUNTER — Other Ambulatory Visit: Payer: Self-pay

## 2023-09-06 DIAGNOSIS — Z87891 Personal history of nicotine dependence: Secondary | ICD-10-CM

## 2023-09-06 DIAGNOSIS — Z122 Encounter for screening for malignant neoplasm of respiratory organs: Secondary | ICD-10-CM

## 2023-09-17 ENCOUNTER — Other Ambulatory Visit: Payer: Self-pay

## 2023-09-22 NOTE — Progress Notes (Unsigned)
Cardiology Note    Date:  09/23/2023   ID:  Eric Palmer, DOB 08/08/1956, MRN [id number]  PCP:  Kristian Covey, MD  Cardiologist:  Armanda Magic, MD   Chief Complaint  Patient presents with   Sleep Apnea   Hypertension   Atrial Fibrillation     History of Present Illness:  Eric Palmer is a 68 y.o. male  with a hx of PAF/flutter, HTN, OSA, DM 2 with peripheral neuropathy, HLD, ASCAD s/p PCI in 1996 and metabolic syndrome.  He is on chronic anticoagulation with Xarelto.    At last OV he had essentially stopped using his device.  He lost 35lb of weight and has felt that the pressure on the PAP device was too much. He felt rested in the am but had to still take naps during the day.   He underwent HST showing no evidence of OSA with an AHI of 1.9/hr.  CPAP no longer needed.   He is here today for followup and is doing well.  He saw his PCP in July and was noted to be in afib but the patient was unaware at that time. He denies any chest pain or pressure, SOB, DOE, PND, orthopnea, LE edema, dizziness, palpitations or syncope. He is compliant with his meds and is tolerating meds with no SE.    Past Medical History:  Diagnosis Date   Allergy    Anal fissure    Chronic bronchitis ([name])    "get it q spring and fall" (10/27/2013)   Coronary artery disease    a. s/p PCI in 1996. b. LHC 01/2006 showed 50% mid LAD, otherwise widely patent cors with minimal irregularities. c. Nuclear stress test in 04/2009 was reportedly low risk, no ischemia. // d. Nuclear stress test 2/19: EF 55, transient ST depression in recovery, no ischemia, inf defect (artifact vs scar)    DM type 2, uncontrolled, with neuropathy    ED (erectile dysfunction)    Essential hypertension    Hx of echocardiogram    Echo (10/2013): Mild LVH, moderate focal basal hypertrophy of the septum, EF 50-55%, no RWMA   Hyperlipidemia    Internal hemorrhoids without mention of complication    Obesity    Obesity    OSA on  CPAP    Paresthesia of both legs    Paroxysmal atrial fibrillation ([name])    a. failed Tikosyn, Multaq. b. s/p ablation of AF/AFL in 12/2013.   Paroxysmal atrial flutter ([name])    a. s/p ablation of AF/AFL in 12/2013.   Sleep apnea    Smoker    QUIT 10/13/13   Tubular adenoma of colon 2016    Past Surgical History:  Procedure Laterality Date   ABLATION  01-12-2014   PVI and CTI by Dr Johney Frame   ANAL FISSURE REPAIR  ~ 2010   ATRIAL FIBRILLATION ABLATION N/A 01/12/2014   Procedure: ATRIAL FIBRILLATION ABLATION;  Surgeon: Gardiner Rhyme, MD;  Location: [facility];  Service: Cardiovascular;  Laterality: N/A;   CARDIAC CATHETERIZATION  2007   CORONARY ANGIOPLASTY WITH STENT PLACEMENT  1996   residual 50% LAD and RCA by cath in 2007   CORONARY ANGIOPLASTY WITH STENT PLACEMENT  1995   INGUINAL HERNIA REPAIR Right 1963   LEFT HEART CATH AND CORONARY ANGIOGRAPHY N/A 10/11/2017   Procedure: LEFT HEART CATH AND CORONARY ANGIOGRAPHY;  Surgeon: Lyn Records, MD;  [facility];  Service: Cardiovascular;  Laterality: N/A;  TEE WITHOUT CARDIOVERSION N/A 01/11/2014   Procedure: TRANSESOPHAGEAL ECHOCARDIOGRAM (TEE);  Surgeon: Vesta Mixer, MD;  Location: [facility] ENDOSCOPY;  Service: Cardiovascular;  Laterality: N/A;   TONSILLECTOMY AND ADENOIDECTOMY  1979    Current Medications: Current Meds  Medication Sig   albuterol (VENTOLIN HFA) 108 (90 Base) MCG/ACT inhaler Inhale 2 puffs into the lungs every 4-6 hours as needed for cough/wheeze.   atorvastatin (LIPITOR) 40 MG tablet Take 1 tablet (40 mg total) by mouth at bedtime.   azelastine (ASTELIN) 0.1 % nasal spray Place 2 sprays into both nostrils 2 (two) times daily. Use in each nostril as directed   diltiazem (CARDIZEM CD) 360 MG 24 hr capsule Take 1 capsule (360 mg total) by mouth daily.   empagliflozin (JARDIANCE) 25 MG TABS tablet Take 1 tablet (25 mg total) by mouth daily.   fluticasone-salmeterol (ADVAIR DISKUS) 100-50 MCG/ACT AEPB  Inhale 1 puff into the lungs 2 (two) times daily.   icosapent Ethyl (VASCEPA) 1 g capsule Take 2 capsules (2 g total) by mouth 2 (two) times daily.   metFORMIN (GLUCOPHAGE-XR) 750 MG 24 hr tablet Take 1 tablet (750 mg total) by mouth 2 (two) times daily.   nitroGLYCERIN (NITROSTAT) 0.4 MG SL tablet DISSOLVE 1 TABLET UNDER THE TONGUE EVERY 5 MINUTES FOR 3 DOSES AS NEEDED FOR CHEST PAIN(CALL 911 IF 3RD DOSE IS TAKEN)   ondansetron (ZOFRAN) 8 MG tablet Take 1 tablet (8 mg total) by mouth every 8 (eight) hours as needed for nausea or vomiting.   rivaroxaban (XARELTO) 20 MG TABS tablet Take 1 tablet (20 mg total) by mouth daily with supper.   sotalol (BETAPACE) 80 MG tablet Take 1 tablet (80 mg total) by mouth 2 (two) times daily.   tadalafil (CIALIS) 20 MG tablet Take 1 tablet (20 mg total) by mouth daily as needed for erectile dysfunction.    Allergies:   Cephalosporins, Penicillins, Thallium, Codeine, Gabapentin, and Eliquis [apixaban]   Social History   Socioeconomic History   Marital status: Married    Spouse name: Not on file   Number of children: Not on file   Years of education: Not on file   Highest education level: Not on file  Occupational History   Not on file  Tobacco Use   Smoking status: Former    Current packs/day: 0.00    Average packs/day: 1 pack/day for 43.0 years (43.0 ttl pk-yrs)    Types: Cigarettes    Start date: 74    Quit date: 2020    Years since quitting: 5.0   Smokeless tobacco: Never  Vaping Use   Vaping status: Never Used  Substance and Sexual Activity   Alcohol use: Yes    Alcohol/week: 0.0 standard drinks of alcohol    Comment: 10/27/2013 "might have 1 beer/month"   Drug use: No   Sexual activity: Yes  Other Topics Concern   Not on file  Social History Narrative   Works as an Psychologist, 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 Strain: Low Risk  (06/06/2023)   Overall Financial Resource Strain (CARDIA)    Difficulty of  Paying Living Expenses: Not hard at all  Food Insecurity: No Food Insecurity (06/06/2023)   Hunger Vital Sign    Worried About Running Out of Food in the Last Year: Never true    Ran Out of Food in the Last Year: Never true  Transportation Needs: No Transportation Needs (06/06/2023)   PRAPARE - Transportation  Lack of Transportation (Medical): No    Lack of Transportation (Non-Medical): No  Physical Activity: Insufficiently Active (06/06/2023)   Exercise Vital Sign    Days of Exercise per Week: 2 days    Minutes of Exercise per Session: 60 min  Stress: No Stress Concern Present (06/06/2023)   [employer] of Occupational Health - Occupational Stress Questionnaire    Feeling of Stress : Not at all  Social Connections: Unknown (06/06/2023)   Social Connection and Isolation Panel [NHANES]    Frequency of Communication with Friends and Family: More than three times a week    Frequency of Social Gatherings with Friends and Family: More than three times a week    Attends Religious Services: Patient declined    Database administrator or Organizations: No    Attends Engineer, structural: Not on file    Marital Status: Married     Family History:  The patient's family history includes Diabetes in his mother; Heart attack in his father; Heart disease in his brother and another family member; Heart disease (age of onset: 2) in his father; Heart disease (age of onset: 63) in his mother; Hyperlipidemia in an other family member; Hypertension in his mother.   ROS:   Please see the history of present illness.    ROS All other systems reviewed and are negative.      No data to display             PHYSICAL EXAM:   VS:  BP 124/78   Pulse 75   Ht 6\' 1"  (1.854 m)   Wt 232 lb (105.2 kg)   SpO2 93%   BMI 30.61 kg/m    GEN: Well nourished, well developed in no acute distress HEENT: Normal NECK: No JVD; No carotid bruits LYMPHATICS: No lymphadenopathy CARDIAC:RRR, no  murmurs, rubs, gallops RESPIRATORY:  Clear to auscultation without rales, wheezing or rhonchi  ABDOMEN: Soft, non-tender, non-distended MUSCULOSKELETAL:  No edema; No deformity  SKIN: Warm and dry NEUROLOGIC:  Alert and oriented x 3 PSYCHIATRIC:  Normal affect  Wt Readings from Last 3 Encounters:  09/23/23 232 lb (105.2 kg)  06/28/23 225 lb (102.1 kg)  06/07/23 225 lb 11.2 oz (102.4 kg)      Studies/Labs Reviewed:    Recent Labs: 06/07/2023: ALT 24; BUN 16; Creatinine, Ser 1.08; Hemoglobin 16.6; Platelets 285.0; Potassium 4.5; Sodium 138   Lipid Panel    Component Value Date/Time   CHOL 142 03/24/2023 0922   CHOL 130 06/29/2019 0724   TRIG 216.0 (H) 03/24/2023 0922   HDL 39.70 03/24/2023 0922   HDL 35 (L) 06/29/2019 0724   CHOLHDL 4 03/24/2023 0922   VLDL 43.2 (H) 03/24/2023 0922   LDLCALC  07/01/2020 1411     Comment:     . LDL cholesterol not calculated. Triglyceride levels greater than 400 mg/dL invalidate calculated LDL results. . Reference range: <100 . Desirable range <100 mg/dL for primary prevention;   <70 mg/dL for patients with CHD or diabetic patients  with > or = 2 CHD risk factors. [name] LDL-C is now calculated using the Martin-Hopkins  calculation, which is a validated novel method providing  better accuracy than the Friedewald equation in the  estimation of LDL-C.  Horald Pollen et al. Lenox Ahr. 1610;960(45): 2061-2068  (http://education.[facility].com/faq/FAQ164)    LDLDIRECT 76.0 03/24/2023 0922       Additional studies/ records that were reviewed today include:  Prior OV notes and echo    ASSESSMENT:  1. Obstructive sleep apnea   2. Essential hypertension   3. Paroxysmal atrial fibrillation ([name])   4. Coronary artery calcification seen on CAT scan       PLAN:  In order of problems listed above:  OSA  -he has lost over 35lbs and stopped using his device in Oct 23 -he has not had any daytime sleepiness and feels rested when he gets  up in the am off CPAP and does not snore -repeat HST 10/13/22 showed no OSA    Hypertension -BP is stable and well controlled on exam -Continue prescription drug management with Cardizem CD 360mg  daily with PRN refills  PAF/PAFlutter -he is maintaining NSR on exam today.   -he had a bout of afib in July when he saw his PCP -he rarely has any palpitations and usually occurs in the setting of stress and severe fatigue -continue prescription drug management with Cardizem CD 360mg  daily, Sotolol 80mg  BID and Xarelto 20mg  daily with PRN refills  Coronary artery calcifications -noted on chest CT 08/2023 -will get a coronary Ca score to assess future cardiac risk -he denies any anginal sx  HLD -LDL goal < 70 -continue prescription drug management with Atorvastatin 40mg  daily and Vascepa 2gm BID -I have personally reviewed and interpreted outside labs performed by patient's PCP which showed LDL 76, HDL 39 and TAGs 216 -repeat FLP and ALT  Time Spent: 20 minutes total time of encounter, including 15 minutes spent in face-to-face patient care on the date of this encounter. This time includes coordination of care and counseling regarding above mentioned problem list. Remainder of non-face-to-face time involved reviewing chart documents/testing relevant to the patient encounter and documentation in the medical record. I have independently reviewed documentation from referring provider  Medication Adjustments/Labs and Tests Ordered: Current medicines are reviewed at length with the patient today.  Concerns regarding medicines are outlined above.  Medication changes, Labs and Tests ordered today are listed in the Patient Instructions below.  There are no Patient Instructions on file for this visit.   Signed, Armanda Magic, MD  09/23/2023 8:07 AM    [facility] [address] [place], [place], [place]  [address] Phone: [phone]; Fax: [phone]

## 2023-09-23 ENCOUNTER — Encounter: Payer: Self-pay | Admitting: Cardiology

## 2023-09-23 ENCOUNTER — Ambulatory Visit: Payer: Medicare Other | Attending: Cardiology | Admitting: Cardiology

## 2023-09-23 ENCOUNTER — Telehealth: Payer: Medicare Other | Admitting: Family Medicine

## 2023-09-23 VITALS — BP 124/78 | HR 75 | Ht 73.0 in | Wt 232.0 lb

## 2023-09-23 DIAGNOSIS — I2510 Atherosclerotic heart disease of native coronary artery without angina pectoris: Secondary | ICD-10-CM

## 2023-09-23 DIAGNOSIS — I480 Paroxysmal atrial fibrillation: Secondary | ICD-10-CM | POA: Diagnosis not present

## 2023-09-23 DIAGNOSIS — Z79899 Other long term (current) drug therapy: Secondary | ICD-10-CM | POA: Diagnosis not present

## 2023-09-23 DIAGNOSIS — G4733 Obstructive sleep apnea (adult) (pediatric): Secondary | ICD-10-CM

## 2023-09-23 DIAGNOSIS — I10 Essential (primary) hypertension: Secondary | ICD-10-CM | POA: Diagnosis not present

## 2023-09-23 DIAGNOSIS — Z0000 Encounter for general adult medical examination without abnormal findings: Secondary | ICD-10-CM | POA: Diagnosis not present

## 2023-09-23 LAB — LIPID PANEL
Chol/HDL Ratio: 3.1 {ratio} (ref 0.0–5.0)
Cholesterol, Total: 111 mg/dL (ref 100–199)
HDL: 36 mg/dL — ABNORMAL LOW (ref >39)
LDL Chol Calc (NIH): 48 mg/dL (ref 0–99)
Triglycerides: 159 mg/dL — ABNORMAL HIGH (ref 0–149)
VLDL Cholesterol Cal: 27 mg/dL (ref 5–40)

## 2023-09-23 LAB — ALT: ALT: 24 [IU]/L (ref 0–44)

## 2023-09-23 NOTE — Patient Instructions (Addendum)
I really enjoyed getting to talk with you today! I am available on Tuesdays and Thursdays for virtual visits if you have any questions or concerns, or if I can be of any further assistance.   CHECKLIST FROM ANNUAL WELLNESS VISIT:  -Follow up (please call to schedule if not scheduled after visit):   -see Dr. Caryl Never in the next 1 month to address concerns   -yearly for annual wellness visit with primary care office  Here is a list of your preventive care/health maintenance measures and the plan for each if any are due:  PLAN For any measures below that may be due:  -request foot exam at next visit in office -can get the vaccines at the pharmacy - If you get any please request receipt to present to Dr. Caryl Never to update your medical record.  Health Maintenance  Topic Date Due   Zoster Vaccines- Shingrix (1 of 2) 01/05/1975   COVID-19 Vaccine (2 - Janssen risk series) 03/21/2020   DTaP/Tdap/Td (2 - Td or Tdap) 01/31/2022   FOOT EXAM  01/01/2023   INFLUENZA VACCINE  03/25/2023   HEMOGLOBIN A1C  12/26/2023   Diabetic kidney evaluation - Urine ACR  03/23/2024   Diabetic kidney evaluation - eGFR measurement  06/06/2024   OPHTHALMOLOGY EXAM  06/14/2024   Lung Cancer Screening  08/30/2024   Medicare Annual Wellness (AWV)  09/22/2024   Colonoscopy  03/09/2032   Pneumonia Vaccine 60+ Years old  Completed   Hepatitis C Screening  Completed   HPV VACCINES  Aged Out    -See a dentist at least yearly  -Get your eyes checked and then per your eye specialist's recommendations  -Other issues addressed today:   -I have included below further information regarding a healthy whole foods based diet, physical activity guidelines for adults, stress management and opportunities for social connections. I hope you find this information useful.    -----------------------------------------------------------------------------------------------------------------------------------------------------------------------------------------------------------------------------------------------------------    NUTRITION: -eat real food: lots of colorful vegetables (half the plate) and fruits -5-7 servings of vegetables and fruits per day (fresh or steamed is best), exp. 2 servings of vegetables with lunch and dinner and 2 servings of fruit per day. Berries and greens such as kale and collards are great choices.  -consume on a regular basis:  fresh fruits, fresh veggies, fish, nuts, seeds, healthy oils (such as olive oil, avocado oil), whole grains (make sure first ingredient on label contains the word "whole"), lean proteins (chicken, tofu, beans/legumes) -can eat small amounts of dairy and lean meat (no larger than the palm of your hand), but avoid processed meats such as ham, bacon, lunch meat, etc. -drink water -try to avoid fast food and pre-packaged foods, processed meat, ultra processed foods (donuts, candy, etc.) -most experts advise limiting sodium to < 2300mg  per day, should limit further is any chronic conditions such as high blood pressure, heart disease, diabetes, etc. The American Heart Association advised that < 1500mg  is is ideal -try to avoid foods that contain any ingredients with names you do not recognize  -try to avoid foods with added sugar or sweeteners/sweets  -try to avoid sweet drinks -try to avoid white rice, white bread, pasta (unless whole grain)  EXERCISE GUIDELINES FOR ADULTS: -if you wish to increase your physical activity, do so gradually and with the approval of your doctor -STOP and seek medical care immediately if you have any chest pain, chest discomfort or trouble breathing when starting or increasing exercise  -move and stretch your body, legs, feet  and arms when sitting for long periods -Physical activity  guidelines for optimal health in adults: -get at least 150 minutes per week of moderate exercise (can talk, but not sing); this is about 20-30 minutes of sustained activity 5-7 days per week or two 10-15 minute episodes of sustained activity 5-7 days per week -do some muscle building/resistance training at least 2 days per week  -balance exercises 3+ days per week:   Stand somewhere where you have something sturdy to hold onto if you lose balance.    1) lift up on toes, start with 5x per day and work up to 20x   2) stand and lift on leg straight out to the side so that foot is a few inches of the floor, start with 5x each side and work up to 20x each side   3) stand on one foot, start with 5 seconds each side and work up to 20 seconds on each side  If you need ideas or help with getting more active:  -Silver sneakers https://tools.silversneakers.com  -Walk with a Doc: http://www.duncan-[email or web address]/  -try to include resistance (weight lifting/strength building) and balance exercises twice per week: or the following link for ideas: http://castillo-powell.com/  BuyDucts.dk  STRESS MANAGEMENT: -can try meditating, or just sitting quietly with deep breathing while intentionally relaxing all parts of your body for 5 minutes daily -if you need further help with stress, anxiety or depression please follow up with your primary doctor or contact the wonderful folks at [facility]: [phone]  SOCIAL CONNECTIONS: -options in [place] if you wish to engage in more social and exercise related activities:  -Silver sneakers https://tools.silversneakers.com  -Walk with a Doc: http://www.duncan-williams.com/  -Check out the [facility] Active Adults 50+ section on the [place] [employer] (hiking clubs, book clubs, cards and games, chess, exercise classes, aquatic classes and much more) - see the  website for details: https://www.[place].gov/departments/parks-recreation/active-adults50  -YouTube has lots of exercise videos for different ages and abilities as well  -[facility] (a variety of indoor and outdoor inperson activities for adults). [phone]. [address]  -Virtual Online Classes (a variety of topics): see seniorplanet.org or call [phone]  -consider volunteering at a school, hospice center, church, senior center or elsewhere

## 2023-09-23 NOTE — Progress Notes (Signed)
PATIENT CHECK-IN and HEALTH RISK ASSESSMENT QUESTIONNAIRE:   Pre-Visit Check-in: 1)Vitals (height, wt, BP, etc) - record in vitals section for visit on day of visit Request home vitals (wt, BP, etc.) and enter into vitals, THEN update Vital Signs SmartPhrase below at the top of the HPI. See below.  2)Review and Update Medications, Allergies PMH, Surgeries, Social history in Epic 3)Hospitalizations in the last year with date/reason? No  4)Review and Update Care Team (patient's specialists) in Epic 5) Complete PHQ9 in Epic  6) Complete Fall Screening in Epic 7)Review all Health Maintenance Due and order under PCP if not done.  Medicare Wellness Patient Questionnaire:  Answer theses question about your habits: How often do you have a drink containing alcohol? Seldom, 1 drink Have you ever smoked?Yes  Quit date if applicable?  5 years   How many packs a day do/did you smoke?  1 Do you use smokeless tobacco? No Do you use an illicit drugs?No On average, how many days per week do you engage in moderate to strenuous exercise (like a brisk walk)? Goes to [employer] and also plays pickleball some. Not getting 150 minutes per week.  Feels his diet is pretty good - wife is contentious.  Typical breakfast: Cereal  Typical lunch: Varies  Typical dinner: Varies  Typical snacks:N/A  Beverages: Milk  Answer theses question about your everyday activities: Can you perform most household chores? Yes Are you deaf or have significant trouble hearing? No Do you feel that you have a problem with memory?No Do you feel safe at home? Yes Last dentist visit? 1 year ago 8. Do you have any difficulty performing your everyday activities?No Are you having any difficulty walking, taking medications on your own, and or difficulty managing daily home needs?No Do you have difficulty walking or climbing stairs?No Do you have difficulty dressing or bathing?No Do you have difficulty doing errands alone such as  visiting a doctor's office or shopping?No Do you currently have any difficulty preparing food and eating?No Do you currently have any difficulty using the toilet?No Do you have any difficulty managing your finances?No Do you have any difficulties with housekeeping of managing your housekeeping?No   Do you have Advanced Directives in place (Living Will, Healthcare Power or Attorney)? Yes   Last eye Exam and location? 1 year ago    Do you currently use prescribed or non-prescribed narcotic or opioid pain medications? No  Do you have a history or close family history of breast, ovarian, tubal or peritoneal cancer or a family member with BRCA (breast cancer susceptibility 1 and 2) gene mutations? No    ----------------------------------------------------------------------------------------------------------------------------------------------------------------------------------------------------------------------  Because this visit was a virtual/telehealth visit, some criteria may be missing or patient reported. Any vitals not documented were not able to be obtained and vitals that have been documented are patient reported.    MEDICARE ANNUAL PREVENTIVE CARE VISIT WITH PROVIDER (Welcome to Medicare, initial annual wellness or annual wellness exam)  Virtual Visit via Video Note  I connected with Eric Palmer on 09/23/23  by a video enabled telemedicine application and verified that I am speaking with the correct person using two identifiers.  Location patient: home Location provider:work or home office Persons participating in the virtual visit: patient, provider  Concerns and/or follow up today: no new concerns. But ever since starting Jardiance he has had increased urination. Reports has discussed with PCP and dose times was changed, but has not seemed to help much. He reports has appointment to see PCP again  soon to discuss further.    See HM section in Epic for other details  of completed HM.    ROS: negative for report of fevers, unintentional weight loss, vision changes, vision loss, hearing loss or change, chest pain, sob, hemoptysis, melena, hematochezia, hematuria, falls, bleeding or bruising, thoughts of suicide or self harm, memory loss  Patient-completed extensive health risk assessment - reviewed and discussed with the patient: See Health Risk Assessment completed with patient prior to the visit either above or in recent phone note. This was reviewed in detailed with the patient today and appropriate recommendations, orders and referrals were placed as needed per Summary below and patient instructions.   Review of Medical History: -PMH, PSH, Family History and current specialty and care providers reviewed and updated and listed below   Patient Care Team: Kristian Covey, MD as PCP - General Quintella Reichert, MD as PCP - Sleep Medicine (Cardiology) Kathleene Hazel, MD as PCP - Cardiology (Cardiology)   Past Medical History:  Diagnosis Date   Allergy    Anal fissure    Chronic bronchitis ([name])    "get it q spring and fall" (10/27/2013)   Coronary artery disease    a. s/p PCI in 1996. b. LHC 01/2006 showed 50% mid LAD, otherwise widely patent cors with minimal irregularities. c. Nuclear stress test in 04/2009 was reportedly low risk, no ischemia. // d. Nuclear stress test 2/19: EF 55, transient ST depression in recovery, no ischemia, inf defect (artifact vs scar)    DM type 2, uncontrolled, with neuropathy    ED (erectile dysfunction)    Essential hypertension    Hx of echocardiogram    Echo (10/2013): Mild LVH, moderate focal basal hypertrophy of the septum, EF 50-55%, no RWMA   Hyperlipidemia    Internal hemorrhoids without mention of complication    Obesity    Obesity    OSA on CPAP    Paresthesia of both legs    Paroxysmal atrial fibrillation ([name])    a. failed Tikosyn, Multaq. b. s/p ablation of AF/AFL in 12/2013.   Paroxysmal atrial  flutter ([name])    a. s/p ablation of AF/AFL in 12/2013.   Sleep apnea    Smoker    QUIT 10/13/13   Tubular adenoma of colon 2016    Past Surgical History:  Procedure Laterality Date   ABLATION  01-12-2014   PVI and CTI by Dr Johney Frame   ANAL FISSURE REPAIR  ~ 2010   ATRIAL FIBRILLATION ABLATION N/A 01/12/2014   Procedure: ATRIAL FIBRILLATION ABLATION;  Surgeon: Gardiner Rhyme, MD;  Location: [facility] CATH LAB;  Service: Cardiovascular;  Laterality: N/A;   CARDIAC CATHETERIZATION  2007   CORONARY ANGIOPLASTY WITH STENT PLACEMENT  1996   residual 50% LAD and RCA by cath in 2007   CORONARY ANGIOPLASTY WITH STENT PLACEMENT  1995   INGUINAL HERNIA REPAIR Right 1963   LEFT HEART CATH AND CORONARY ANGIOGRAPHY N/A 10/11/2017   Procedure: LEFT HEART CATH AND CORONARY ANGIOGRAPHY;  Surgeon: Lyn Records, MD;  Location: [facility] INVASIVE CV LAB;  Service: Cardiovascular;  Laterality: N/A;   TEE WITHOUT CARDIOVERSION N/A 01/11/2014   Procedure: TRANSESOPHAGEAL ECHOCARDIOGRAM (TEE);  Surgeon: Vesta Mixer, MD;  Location: [facility] ENDOSCOPY;  Service: Cardiovascular;  Laterality: N/A;   TONSILLECTOMY AND ADENOIDECTOMY  1979    Social History   Socioeconomic History   Marital status: Married    Spouse name: Not on file   Number of children: Not on file  Years of education: Not on file   Highest education level: Not on file  Occupational History   Not on file  Tobacco Use   Smoking status: Former    Current packs/day: 0.00    Average packs/day: 1 pack/day for 43.0 years (43.0 ttl pk-yrs)    Types: Cigarettes    Start date: 63    Quit date: 2020    Years since quitting: 5.0   Smokeless tobacco: Never  Vaping Use   Vaping status: Never Used  Substance and Sexual Activity   Alcohol use: Yes    Alcohol/week: 0.0 standard drinks of alcohol    Comment: 10/27/2013 "might have 1 beer/month"   Drug use: No   Sexual activity: Yes  Other Topics Concern   Not on file  Social History Narrative   Works as an  Psychologist, 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 Strain: Low Risk  (09/23/2023)   Overall Financial Resource Strain (CARDIA)    Difficulty of Paying Living Expenses: Not hard at all  Food Insecurity: No Food Insecurity (09/23/2023)   Hunger Vital Sign    Worried About Running Out of Food in the Last Year: Never true    Ran Out of Food in the Last Year: Never true  Transportation Needs: No Transportation Needs (09/23/2023)   PRAPARE - Administrator, Civil Service (Medical): No    Lack of Transportation (Non-Medical): No  Physical Activity: Insufficiently Active (09/23/2023)   Exercise Vital Sign    Days of Exercise per Week: 2 days    Minutes of Exercise per Session: 60 min  Stress: No Stress Concern Present (09/23/2023)   [employer] of Occupational Health - Occupational Stress Questionnaire    Feeling of Stress : Not at all  Social Connections: Moderately Integrated (09/23/2023)   Social Connection and Isolation Panel [NHANES]    Frequency of Communication with Friends and Family: More than three times a week    Frequency of Social Gatherings with Friends and Family: More than three times a week    Attends Religious Services: Never    Database administrator or Organizations: Yes    Attends Engineer, structural: More than 4 times per year    Marital Status: Married  Catering manager Violence: Not At Risk (09/23/2023)   Humiliation, Afraid, Rape, and Kick questionnaire    Fear of Current or Ex-Partner: No    Emotionally Abused: No    Physically Abused: No    Sexually Abused: No    Family History  Problem Relation Age of Onset   Diabetes Mother    Heart disease Mother 22       Arrhythmia   Hypertension Mother    Heart disease Father 23       died 15 CHF   Heart attack Father    Heart disease Brother    Hyperlipidemia Other    Heart disease Other    Stroke Neg Hx    Colon cancer Neg Hx    Esophageal cancer Neg Hx    Liver  disease Neg Hx    Pancreatic cancer Neg Hx    Stomach cancer Neg Hx     Current Outpatient Medications on File Prior to Visit  Medication Sig Dispense Refill   albuterol (VENTOLIN HFA) 108 (90 Base) MCG/ACT inhaler Inhale 2 puffs into the lungs every 4-6 hours as needed for cough/wheeze. 6.7 g 1   atorvastatin (LIPITOR) 40 MG tablet Take 1  tablet (40 mg total) by mouth at bedtime. 90 tablet 3   azelastine (ASTELIN) 0.1 % nasal spray Place 2 sprays into both nostrils 2 (two) times daily. Use in each nostril as directed 30 mL 11   diltiazem (CARDIZEM CD) 360 MG 24 hr capsule Take 1 capsule (360 mg total) by mouth daily. 90 capsule 3   empagliflozin (JARDIANCE) 25 MG TABS tablet Take 1 tablet (25 mg total) by mouth daily. 90 tablet 1   fluticasone-salmeterol (ADVAIR DISKUS) 100-50 MCG/ACT AEPB Inhale 1 puff into the lungs 2 (two) times daily. 60 each 11   glucose blood (ONETOUCH VERIO) test strip Test blood sugar once daily 100 each 0   icosapent Ethyl (VASCEPA) 1 g capsule Take 2 capsules (2 g total) by mouth 2 (two) times daily. 360 capsule 2   metFORMIN (GLUCOPHAGE-XR) 750 MG 24 hr tablet Take 1 tablet (750 mg total) by mouth 2 (two) times daily. 180 tablet 1   nitroGLYCERIN (NITROSTAT) 0.4 MG SL tablet DISSOLVE 1 TABLET UNDER THE TONGUE EVERY 5 MINUTES FOR 3 DOSES AS NEEDED FOR CHEST PAIN(CALL 911 IF 3RD DOSE IS TAKEN) 25 tablet 3   ondansetron (ZOFRAN) 8 MG tablet Take 1 tablet (8 mg total) by mouth every 8 (eight) hours as needed for nausea or vomiting. 15 tablet 0   ONETOUCH DELICA LANCETS FINE MISC USE TO CHECK BLOOD SUGAR ONCE DAILY 100 each 0   rivaroxaban (XARELTO) 20 MG TABS tablet Take 1 tablet (20 mg total) by mouth daily with supper. 90 tablet 3   sotalol (BETAPACE) 80 MG tablet Take 1 tablet (80 mg total) by mouth 2 (two) times daily. 180 tablet 3   tadalafil (CIALIS) 20 MG tablet Take 1 tablet (20 mg total) by mouth daily as needed for erectile dysfunction. 10 tablet 3   No  current facility-administered medications on file prior to visit.    Allergies  Allergen Reactions   Cephalosporins Anaphylaxis   Penicillins Anaphylaxis and Other (See Comments)    Has patient had a PCN reaction causing immediate rash, facial/tongue/throat swelling, SOB or lightheadedness with hypotension: Yes Has patient had a PCN reaction causing severe rash involving mucus membranes or skin necrosis: No Has patient had a PCN reaction that required hospitalization: No - went to doctors office Has patient had a PCN reaction occurring within the last 10 years: No If all of the above answers are "NO", then may proceed with Cephalosporin use.    Thallium Itching and Rash   Codeine Itching   Gabapentin Other (See Comments)    "made me feel drugged"   Eliquis [Apixaban] Diarrhea       Physical Exam Vitals requested from patient and listed below if patient had equipment and was able to obtain at home for this virtual visit: There were no vitals filed for this visit. Estimated body mass index is 30.61 kg/m as calculated from the following:   Height as of an earlier encounter on 09/23/23: 6\' 1"  (1.854 m).   Weight as of an earlier encounter on 09/23/23: 232 lb (105.2 kg).  EKG (optional): deferred due to virtual visit  GENERAL: alert, oriented, no acute distress detected; full vision exam deferred due to pandemic and/or virtual encounter  HEENT: atraumatic, conjunttiva clear, no obvious abnormalities on inspection of external nose and ears  NECK: normal movements of the head and neck  LUNGS: on inspection no signs of respiratory distress, breathing rate appears normal, no obvious gross SOB, gasping or wheezing  CV: no  obvious cyanosis  MS: moves all visible extremities without noticeable abnormality  PSYCH/NEURO: pleasant and cooperative, no obvious depression or anxiety, speech and thought processing grossly intact, Cognitive function grossly intact  Flowsheet Row Video Visit  from 09/23/2023 in [facility]  PHQ-9 Total Score 0           09/23/2023    3:03 PM 08/18/2022   10:57 AM 12/31/2021   10:18 AM 07/01/2020    1:26 PM 04/09/2017   10:08 AM  Depression screen PHQ 2/9  Decreased Interest 0 0 0 0 0  Down, Depressed, Hopeless 0 0 0 0 0  PHQ - 2 Score 0 0 0 0 0  Altered sleeping 0      Tired, decreased energy 0      Change in appetite 0      Feeling bad or failure about yourself  0      Trouble concentrating 0      Moving slowly or fidgety/restless 0      Suicidal thoughts 0      PHQ-9 Score 0      Difficult doing work/chores Not difficult at all           04/06/2022    8:52 AM 07/03/2022    8:50 AM 08/15/2022   12:22 PM 04/26/2023    8:04 PM 09/23/2023    3:03 PM  Fall Risk  Falls in the past year? 1 0  1 0  Was there an injury with Fall? 0   1 0  Fall Risk Category Calculator 2   3  0  Fall Risk Category (Retired) Moderate      (RETIRED) Patient Fall Risk Level Low fall risk  Low fall risk    Patient at Risk for Falls Due to No Fall Risks    No Fall Risks  Fall risk Follow up Falls evaluation completed    Falls evaluation completed     Patient-reported   Slipped fall. Feels like balance is not as good.   SUMMARY AND PLAN:  Encounter for Medicare annual wellness exam   Discussed applicable health maintenance/preventive health measures and advised and referred or ordered per patient preferences: -reports used to see podiatry in the past, takes good care of his feet now and agrees to get foot exam with PCP at upcoming visit -discussed vaccines due and advised per [facility], he plans to consider and discuss with PCP, know can get flu shot at the office and other vaccines at the pharmacy -discussed prostate screening and he plans to discuss further with PCP, last PSA in 2023 Health Maintenance  Topic Date Due   Zoster Vaccines- Shingrix (1 of 2) 01/05/1976   COVID-19 Vaccine (2 - Janssen risk series) 03/21/2020    DTaP/Tdap/Td (2 - Td or Tdap) 01/31/2022   FOOT EXAM  01/01/2023   INFLUENZA VACCINE  03/25/2023   HEMOGLOBIN A1C  12/26/2023   Diabetic kidney evaluation - Urine ACR  03/23/2024   Diabetic kidney evaluation - eGFR measurement  06/06/2024   OPHTHALMOLOGY EXAM  06/14/2024   Lung Cancer Screening  08/30/2024   Medicare Annual Wellness (AWV)  09/22/2024   Colonoscopy  03/09/2032   Pneumonia Vaccine 67+ Years old  Completed   Hepatitis C Screening  Completed   HPV VACCINES  Aged [employer] and counseling on the following was provided based on the above review of health and a plan/checklist for the patient, along with additional information discussed,  was provided for the patient in the patient instructions :  -Provided counseling and plan for increased risk of falling if applicable per above screening. Reviewed and demonstrated safe balance exercises that can be done at home to improve balance and discussed exercise guidelines for adults with include balance exercises at least 3 days per week.  -Advised and counseled on a healthy lifestyle - including the importance of a healthy diet, regular physical activity, social connections -Reviewed patient's current diet. Advised and counseled on a whole foods based healthy diet. Discussed evidence based diet recs for diabetes. Advised avoidance of foods with added sugar or sweetener and ultra-processed grains. Advised if consuming grains to ensure small portions and whole grain. Encouraged half the plate vegetables for lunch and dinner and to choose lower sugar fruits if consuming fruit. Discussed healthy fats and proteins as well. Advised avoidance of ultraprocessd foods, meat and beverages. A summary of a healthy diet was provided in the Patient Instructions.  -reviewed patient's current physical activity level and discussed exercise guidelines for adults. Discussed community resources and ideas for safe exercise at home to assist in meeting  exercise guideline recommendations in a safe and healthy way.  -Advise yearly dental visits at minimum and regular eye exams   Follow up: see patient instructions   Patient Instructions  I really enjoyed getting to talk with you today! I am available on Tuesdays and Thursdays for virtual visits if you have any questions or concerns, or if I can be of any further assistance.   CHECKLIST FROM ANNUAL WELLNESS VISIT:  -Follow up (please call to schedule if not scheduled after visit):   -see Dr. Caryl Never in the next 1 month to address concerns   -yearly for annual wellness visit with primary care office  Here is a list of your preventive care/health maintenance measures and the plan for each if any are due:  PLAN For any measures below that may be due:  -request foot exam at next visit in office -can get the vaccines at the pharmacy - If you get any please request receipt to present to Dr. Caryl Never to update your medical record.  Health Maintenance  Topic Date Due   Zoster Vaccines- Shingrix (1 of 2) 01/05/1976   COVID-19 Vaccine (2 - Janssen risk series) 03/21/2020   DTaP/Tdap/Td (2 - Td or Tdap) 01/31/2022   FOOT EXAM  01/01/2023   INFLUENZA VACCINE  03/25/2023   HEMOGLOBIN A1C  12/26/2023   Diabetic kidney evaluation - Urine ACR  03/23/2024   Diabetic kidney evaluation - eGFR measurement  06/06/2024   OPHTHALMOLOGY EXAM  06/14/2024   Lung Cancer Screening  08/30/2024   Medicare Annual Wellness (AWV)  09/22/2024   Colonoscopy  03/09/2032   Pneumonia Vaccine 54+ Years old  Completed   Hepatitis C Screening  Completed   HPV VACCINES  Aged Out    -See a dentist at least yearly  -Get your eyes checked and then per your eye specialist's recommendations  -Other issues addressed today:   -I have included below further information regarding a healthy whole foods based diet, physical activity guidelines for adults, stress management and opportunities for social connections. I  hope you find this information useful.   -----------------------------------------------------------------------------------------------------------------------------------------------------------------------------------------------------------------------------------------------------------    NUTRITION: -eat real food: lots of colorful vegetables (half the plate) and fruits -5-7 servings of vegetables and fruits per day (fresh or steamed is best), exp. 2 servings of vegetables with lunch and dinner and 2 servings of fruit per day. Berries  and greens such as kale and collards are great choices.  -consume on a regular basis:  fresh fruits, fresh veggies, fish, nuts, seeds, healthy oils (such as olive oil, avocado oil), whole grains (make sure first ingredient on label contains the word "whole"), lean proteins (chicken, tofu, beans/legumes) -can eat small amounts of dairy and lean meat (no larger than the palm of your hand), but avoid processed meats such as ham, bacon, lunch meat, etc. -drink water -try to avoid fast food and pre-packaged foods, processed meat, ultra processed foods (donuts, candy, etc.) -most experts advise limiting sodium to < 2300mg  per day, should limit further is any chronic conditions such as high blood pressure, heart disease, diabetes, etc. The American Heart Association advised that < 1500mg  is is ideal -try to avoid foods that contain any ingredients with names you do not recognize  -try to avoid foods with added sugar or sweeteners/sweets  -try to avoid sweet drinks -try to avoid white rice, white bread, pasta (unless whole grain)  EXERCISE GUIDELINES FOR ADULTS: -if you wish to increase your physical activity, do so gradually and with the approval of your doctor -STOP and seek medical care immediately if you have any chest pain, chest discomfort or trouble breathing when starting or increasing exercise  -move and stretch your body, legs, feet and arms when sitting  for long periods -Physical activity guidelines for optimal health in adults: -get at least 150 minutes per week of moderate exercise (can talk, but not sing); this is about 20-30 minutes of sustained activity 5-7 days per week or two 10-15 minute episodes of sustained activity 5-7 days per week -do some muscle building/resistance training at least 2 days per week  -balance exercises 3+ days per week:   Stand somewhere where you have something sturdy to hold onto if you lose balance.    1) lift up on toes, start with 5x per day and work up to 20x   2) stand and lift on leg straight out to the side so that foot is a few inches of the floor, start with 5x each side and work up to 20x each side   3) stand on one foot, start with 5 seconds each side and work up to 20 seconds on each side  If you need ideas or help with getting more active:  -Silver sneakers https://tools.silversneakers.com  -Walk with a Doc: http://www.duncan-williams.com/  -try to include resistance (weight lifting/strength building) and balance exercises twice per week: or the following link for ideas: http://castillo-powell.com/  BuyDucts.dk  STRESS MANAGEMENT: -can try meditating, or just sitting quietly with deep breathing while intentionally relaxing all parts of your body for 5 minutes daily -if you need further help with stress, anxiety or depression please follow up with your primary doctor or contact the wonderful folks at [facility]: [phone]  SOCIAL CONNECTIONS: -options in [place] if you wish to engage in more social and exercise related activities:  -Silver sneakers https://tools.silversneakers.com  -Walk with a Doc: http://www.duncan-williams.com/  -Check out the [facility] Active Adults 50+ section on the [place] [employer] (hiking clubs, book clubs, cards and games, chess, exercise classes, aquatic  classes and much more) - see the website for details: https://www.[place].gov/departments/parks-recreation/active-adults50  -YouTube has lots of exercise videos for different ages and abilities as well  -[facility] (a variety of indoor and outdoor inperson activities for adults). [phone]. [address]  -Virtual Online Classes (a variety of topics): see seniorplanet.org or call [phone]  -consider  volunteering at a school, hospice center, church, senior center or elsewhere            Terressa Koyanagi, DO

## 2023-09-23 NOTE — Progress Notes (Signed)
Patient unable to obtain vital signs due to telehealth visit

## 2023-09-23 NOTE — Addendum Note (Signed)
Addended by: Luellen Pucker on: 09/23/2023 08:32 AM   Modules accepted: Orders

## 2023-09-23 NOTE — Patient Instructions (Signed)
Medication Instructions:  Your physician recommends that you continue on your current medications as directed. Please refer to the Current Medication list given to you today.  *If you need a refill on your cardiac medications before your next appointment, please call your pharmacy*   Lab Work: Please complete a FASTING lipid panel and an ALT at the [facility] on the first floor today before you leave.  If you have labs (blood work) drawn today and your tests are completely normal, you will receive your results only by: MyChart Message (if you have MyChart) OR A paper copy in the mail If you have any lab test that is abnormal or we need to change your treatment, we will call you to review the results.   Testing/Procedures: Dr. Mayford Knife has ordered a coronary calcium score CT. This is a test that can provide early detection of coronary artery disease. Patient cost is usually $94-99.   Follow-Up:  Your next appointment:   1 year(s)  Provider:   Dr. Armanda Magic, MD

## 2023-10-01 ENCOUNTER — Other Ambulatory Visit (HOSPITAL_COMMUNITY): Payer: Self-pay

## 2023-10-01 ENCOUNTER — Telehealth: Payer: Self-pay

## 2023-10-01 NOTE — Telephone Encounter (Signed)
 Pharmacy Patient Advocate Encounter   Received notification from CoverMyMeds that prior authorization for VASCEPA  is required/requested.   Insurance verification completed.   The patient is insured through [facility] [employer]IC[employer] .   Per test claim: Refill too soon. PA is not needed at this time. Medication was filled 09/16/23. Next eligible fill date is 10/11/23.

## 2023-10-02 ENCOUNTER — Other Ambulatory Visit: Payer: Self-pay | Admitting: Family Medicine

## 2023-10-03 ENCOUNTER — Other Ambulatory Visit: Payer: Self-pay

## 2023-10-04 ENCOUNTER — Other Ambulatory Visit (HOSPITAL_BASED_OUTPATIENT_CLINIC_OR_DEPARTMENT_OTHER): Payer: Self-pay

## 2023-10-04 ENCOUNTER — Encounter: Payer: Self-pay | Admitting: Family Medicine

## 2023-10-04 DIAGNOSIS — L989 Disorder of the skin and subcutaneous tissue, unspecified: Secondary | ICD-10-CM

## 2023-10-04 MED ORDER — FLUTICASONE-SALMETEROL 100-50 MCG/ACT IN AEPB
1.0000 | INHALATION_SPRAY | Freq: Two times a day (BID) | RESPIRATORY_TRACT | 5 refills | Status: DC
  Filled 2023-10-04: qty 60, 30d supply, fill #0
  Filled 2023-11-14: qty 60, 30d supply, fill #1
  Filled 2024-02-11 – 2024-02-12 (×2): qty 60, 30d supply, fill #2
  Filled 2024-03-12: qty 60, 30d supply, fill #3
  Filled 2024-04-11: qty 60, 30d supply, fill #4
  Filled 2024-05-15: qty 60, 30d supply, fill #5

## 2023-10-04 MED ORDER — ONDANSETRON HCL 8 MG PO TABS
8.0000 mg | ORAL_TABLET | Freq: Three times a day (TID) | ORAL | 0 refills | Status: DC | PRN
  Filled 2023-10-04: qty 15, 5d supply, fill #0

## 2023-10-14 ENCOUNTER — Ambulatory Visit (HOSPITAL_BASED_OUTPATIENT_CLINIC_OR_DEPARTMENT_OTHER)
Admission: RE | Admit: 2023-10-14 | Discharge: 2023-10-14 | Disposition: A | Discharge status: Home / Self Care | Payer: Self-pay | Source: Ambulatory Visit | Attending: Cardiology | Admitting: Cardiology

## 2023-10-14 DIAGNOSIS — I2510 Atherosclerotic heart disease of native coronary artery without angina pectoris: Secondary | ICD-10-CM | POA: Insufficient documentation

## 2023-10-18 ENCOUNTER — Other Ambulatory Visit: Payer: Self-pay

## 2023-10-18 ENCOUNTER — Other Ambulatory Visit (HOSPITAL_BASED_OUTPATIENT_CLINIC_OR_DEPARTMENT_OTHER): Payer: Self-pay

## 2023-10-20 ENCOUNTER — Other Ambulatory Visit (HOSPITAL_BASED_OUTPATIENT_CLINIC_OR_DEPARTMENT_OTHER): Payer: Self-pay

## 2023-10-20 ENCOUNTER — Ambulatory Visit: Payer: Medicare Other | Admitting: Gastroenterology

## 2023-10-20 ENCOUNTER — Other Ambulatory Visit: Payer: Self-pay

## 2023-10-20 ENCOUNTER — Encounter: Payer: Self-pay | Admitting: Gastroenterology

## 2023-10-20 VITALS — BP 118/60 | HR 63 | Ht 73.0 in | Wt 229.0 lb

## 2023-10-20 DIAGNOSIS — R110 Nausea: Secondary | ICD-10-CM | POA: Diagnosis not present

## 2023-10-20 MED ORDER — OMEPRAZOLE 40 MG PO CPDR
40.0000 mg | DELAYED_RELEASE_CAPSULE | Freq: Every day | ORAL | 1 refills | Status: DC
  Filled 2023-10-20: qty 90, 90d supply, fill #0

## 2023-10-20 NOTE — Progress Notes (Signed)
 10/20/2023 Eric Palmer [id number] 12-17-1955   HISTORY OF PRESENT ILLNESS: This is a 68 year old male who was previously a patient of Dr. Ardell Isaacs.  His care will be assigned to Dr. Leonides Schanz.  He is here today with complaints of nausea.  He tells me that for the past year he has been struggling with some issues of nausea.  He says that he does not have nausea every day, but most days.  He says that it usually occurs mostly in the morning.  He does have Zofran at home from his PCP, which he takes usually maybe once or twice a week when the nausea gets more severe.  He denies vomiting.  Denies abdominal pain.  He takes only OTC omeprazole daily and actually splits that in half so on very low-dose which he has been on for quite some time.  He denies any overt heartburn or reflux symptoms.  He did have an episode in November while he was in [facility] where he had hiccups almost continuously for 4 days.  He said they would go away for short time during the day, but would come back again.  He said that he has never had anything like that before and it has not recurred since then.  He did start Jardiance about a year ago and wonders if that is causing his nausea as he has had some other issues with the medication as well.  He also thinks maybe his Vascepa could be causing it.  He is a high risk cardiac patient has had intervention in the past.  Had a recent CT with cardiac scoring that showed him to be very high risk.  Has follow-up with cardiology in April.  Never had an EGD.  Colonoscopy UTD, last was 02/2022.   Past Medical History:  Diagnosis Date   Allergy    Anal fissure    Chronic bronchitis ([name])    "get it q spring and fall" (10/27/2013)   Coronary artery disease    a. s/p PCI in 1996. b. LHC 01/2006 showed 50% mid LAD, otherwise widely patent cors with minimal irregularities. c. Nuclear stress test in 04/2009 was reportedly low risk, no ischemia. // d. Nuclear stress test 2/19: EF 55, transient  ST depression in recovery, no ischemia, inf defect (artifact vs scar)    DM type 2, uncontrolled, with neuropathy    ED (erectile dysfunction)    Essential hypertension    Hx of echocardiogram    Echo (10/2013): Mild LVH, moderate focal basal hypertrophy of the septum, EF 50-55%, no RWMA   Hyperlipidemia    Internal hemorrhoids without mention of complication    Obesity    Obesity    OSA on CPAP    Paresthesia of both legs    Paroxysmal atrial fibrillation ([name])    a. failed Tikosyn, Multaq. b. s/p ablation of AF/AFL in 12/2013.   Paroxysmal atrial flutter ([name])    a. s/p ablation of AF/AFL in 12/2013.   Sleep apnea    Smoker    QUIT 10/13/13   Tubular adenoma of colon 2016   Past Surgical History:  Procedure Laterality Date   ABLATION  01-12-2014   PVI and CTI by Dr Johney Frame   ANAL FISSURE REPAIR  ~ 2010   ATRIAL FIBRILLATION ABLATION N/A 01/12/2014   Procedure: ATRIAL FIBRILLATION ABLATION;  Surgeon: Gardiner Rhyme, MD;  Location: [facility] CATH LAB;  Service: Cardiovascular;  Laterality: N/A;   CARDIAC CATHETERIZATION  2007  CORONARY ANGIOPLASTY WITH STENT PLACEMENT  1996   residual 50% LAD and RCA by cath in 2007   CORONARY ANGIOPLASTY WITH STENT PLACEMENT  1995   INGUINAL HERNIA REPAIR Right 1963   LEFT HEART CATH AND CORONARY ANGIOGRAPHY N/A 10/11/2017   Procedure: LEFT HEART CATH AND CORONARY ANGIOGRAPHY;  Surgeon: Lyn Records, MD;  Location: [facility] INVASIVE CV LAB;  Service: Cardiovascular;  Laterality: N/A;   TEE WITHOUT CARDIOVERSION N/A 01/11/2014   Procedure: TRANSESOPHAGEAL ECHOCARDIOGRAM (TEE);  Surgeon: Vesta Mixer, MD;  Location: [facility] ENDOSCOPY;  Service: Cardiovascular;  Laterality: N/A;   TONSILLECTOMY AND ADENOIDECTOMY  1979    reports that he quit smoking about 5 years ago. His smoking use included cigarettes. He started smoking about 48 years ago. He has a 43 pack-year smoking history. He has never used smokeless tobacco. He reports current alcohol use. He reports that he  does not use drugs. family history includes Diabetes in his mother; Heart attack in his father; Heart disease in his brother and another family member; Heart disease (age of onset: 46) in his father; Heart disease (age of onset: 61) in his mother; Hyperlipidemia in an other family member; Hypertension in his mother. Allergies  Allergen Reactions   Cephalosporins Anaphylaxis   Penicillins Anaphylaxis and Other (See Comments)    Has patient had a PCN reaction causing immediate rash, facial/tongue/throat swelling, SOB or lightheadedness with hypotension: Yes Has patient had a PCN reaction causing severe rash involving mucus membranes or skin necrosis: No Has patient had a PCN reaction that required hospitalization: No - went to doctors office Has patient had a PCN reaction occurring within the last 10 years: No If all of the above answers are "NO", then may proceed with Cephalosporin use.    Thallium Itching and Rash   Codeine Itching   Gabapentin Other (See Comments)    "made me feel drugged"   Eliquis [Apixaban] Diarrhea      Outpatient Encounter Medications as of 10/20/2023  Medication Sig   albuterol (VENTOLIN HFA) 108 (90 Base) MCG/ACT inhaler Inhale 2 puffs into the lungs every 4-6 hours as needed for cough/wheeze.   atorvastatin (LIPITOR) 40 MG tablet Take 1 tablet (40 mg total) by mouth at bedtime.   azelastine (ASTELIN) 0.1 % nasal spray Place 2 sprays into both nostrils 2 (two) times daily. Use in each nostril as directed   diltiazem (CARDIZEM CD) 360 MG 24 hr capsule Take 1 capsule (360 mg total) by mouth daily.   empagliflozin (JARDIANCE) 25 MG TABS tablet Take 1 tablet (25 mg total) by mouth daily.   fluticasone-salmeterol (ADVAIR DISKUS) 100-50 MCG/ACT AEPB Inhale 1 puff into the lungs 2 (two) times daily.   glucose blood (ONETOUCH VERIO) test strip Test blood sugar once daily   icosapent Ethyl (VASCEPA) 1 g capsule Take 2 capsules (2 g total) by mouth 2 (two) times daily.    metFORMIN (GLUCOPHAGE-XR) 750 MG 24 hr tablet Take 1 tablet (750 mg total) by mouth 2 (two) times daily.   nitroGLYCERIN (NITROSTAT) 0.4 MG SL tablet DISSOLVE 1 TABLET UNDER THE TONGUE EVERY 5 MINUTES FOR 3 DOSES AS NEEDED FOR CHEST PAIN(CALL 911 IF 3RD DOSE IS TAKEN)   ondansetron (ZOFRAN) 8 MG tablet Take 1 tablet (8 mg total) by mouth every 8 (eight) hours as needed for nausea or vomiting.   ONETOUCH DELICA LANCETS FINE MISC USE TO CHECK BLOOD SUGAR ONCE DAILY   rivaroxaban (XARELTO) 20 MG TABS tablet Take 1 tablet (20 mg  total) by mouth daily with supper.   sotalol (BETAPACE) 80 MG tablet Take 1 tablet (80 mg total) by mouth 2 (two) times daily.   tadalafil (CIALIS) 20 MG tablet Take 1 tablet (20 mg total) by mouth daily as needed for erectile dysfunction.   No facility-administered encounter medications on file as of 10/20/2023.    REVIEW OF SYSTEMS  : All other systems reviewed and negative except where noted in the History of Present Illness.   PHYSICAL EXAM: BP 118/60   Pulse 63   Ht 6\' 1"  (1.854 m)   Wt 229 lb (103.9 kg)   BMI 30.21 kg/m  General: Well developed white male in no acute distress Head: Normocephalic and atraumatic Eyes:  Sclerae anicteric, conjunctiva pink. Ears: Normal auditory acuity Lungs: Clear throughout to auscultation; no W/R/R. Heart: Regular rate and rhythm; no M/R/G. Abdomen: Soft, non-distended.  BS present.  Non-tender. Musculoskeletal: Symmetrical with no gross deformities  Skin: No lesions on visible extremities Extremities: No edema  Neurological: Alert oriented x 4, grossly non-focal Psychological:  Alert and cooperative. Normal mood and affect  ASSESSMENT AND PLAN: *Nausea:  Nausea frequently in the mornings, not every day but most days for the past year or so.  Question source.  Could be several things when talking about nausea alone.  He did have a 4 day episode with hiccups back in November.  He thinks the nausea could be due to his  Jardiance as he started that about a year ago or he thinks maybe it could be his Vascepa.  ? If it could be reflux related.  He is only on omeprazole 10 mg OTC daily.  Will increase omeprazole to 40 mg daily for several weeks to see if that helps.  Will also let him discuss trials of coming off of some medications for a couple of weeks at a time with his PCP when he sees him soon.  Could consider endoscopy.  He had a high risk cardiac CT scan recently (has long-standing cardiac history).  ? If cardiology is going to recommend any other work-up or if the nausea could be from cardiac issues.  Prescription sent to pharmacy.   CC:  Kristian Covey, MD

## 2023-10-20 NOTE — Progress Notes (Signed)
 I agree with the assessment and plan as outlined by Ms. Cristi Loron.

## 2023-10-20 NOTE — Patient Instructions (Signed)
 We have sent the following medications to your pharmacy for you to pick up at your convenience: Omeprazole 40 mg daily 30-60 minutes before breakfast.   Discuss with PCP regarding holding medications.   _______________________________________________________  If your blood pressure at your visit was 140/90 or greater, please contact your primary care physician to follow up on this.  _______________________________________________________  If you are age 67 or older, your body mass index should be between 23-30. Your Body mass index is 30.21 kg/m. If this is out of the aforementioned range listed, please consider follow up with your Primary Care Provider.  If you are age 71 or younger, your body mass index should be between 19-25. Your Body mass index is 30.21 kg/m. If this is out of the aformentioned range listed, please consider follow up with your Primary Care Provider.   ________________________________________________________  The [facility] providers would like to encourage you to use [facility] to communicate with providers for non-urgent requests or questions.  Due to long hold times on the telephone, sending your provider a message by [facility] may be a faster and more efficient way to get a response.  Please allow 48 business hours for a response.  Please remember that this is for non-urgent requests.  _______________________________________________________

## 2023-10-22 ENCOUNTER — Encounter: Payer: Self-pay | Admitting: *Deleted

## 2023-10-25 ENCOUNTER — Ambulatory Visit: Payer: Medicare Other | Admitting: Family Medicine

## 2023-10-26 ENCOUNTER — Ambulatory Visit: Payer: Medicare Other | Admitting: Family Medicine

## 2023-10-31 ENCOUNTER — Other Ambulatory Visit: Payer: Self-pay | Admitting: Family Medicine

## 2023-11-01 ENCOUNTER — Other Ambulatory Visit: Payer: Self-pay

## 2023-11-01 ENCOUNTER — Other Ambulatory Visit (HOSPITAL_BASED_OUTPATIENT_CLINIC_OR_DEPARTMENT_OTHER): Payer: Self-pay

## 2023-11-01 MED ORDER — ALBUTEROL SULFATE HFA 108 (90 BASE) MCG/ACT IN AERS
2.0000 | INHALATION_SPRAY | RESPIRATORY_TRACT | 1 refills | Status: DC | PRN
  Filled 2023-11-01: qty 6.7, 17d supply, fill #0
  Filled 2023-12-13: qty 6.7, 17d supply, fill #1

## 2023-11-01 MED ORDER — ONDANSETRON HCL 8 MG PO TABS
8.0000 mg | ORAL_TABLET | Freq: Three times a day (TID) | ORAL | 0 refills | Status: DC | PRN
  Filled 2023-11-01: qty 15, 5d supply, fill #0

## 2023-11-02 ENCOUNTER — Ambulatory Visit (INDEPENDENT_AMBULATORY_CARE_PROVIDER_SITE_OTHER): Payer: Medicare Other | Admitting: Family Medicine

## 2023-11-02 ENCOUNTER — Encounter: Payer: Self-pay | Admitting: Family Medicine

## 2023-11-02 VITALS — BP 122/74 | HR 73 | Temp 97.5°F | Ht 73.0 in | Wt 228.5 lb

## 2023-11-02 DIAGNOSIS — E7849 Other hyperlipidemia: Secondary | ICD-10-CM | POA: Diagnosis not present

## 2023-11-02 DIAGNOSIS — I10 Essential (primary) hypertension: Secondary | ICD-10-CM | POA: Diagnosis not present

## 2023-11-02 DIAGNOSIS — E1142 Type 2 diabetes mellitus with diabetic polyneuropathy: Secondary | ICD-10-CM | POA: Diagnosis not present

## 2023-11-02 DIAGNOSIS — K760 Fatty (change of) liver, not elsewhere classified: Secondary | ICD-10-CM

## 2023-11-02 LAB — POCT GLYCOSYLATED HEMOGLOBIN (HGB A1C): Hemoglobin A1C: 7.5 % — AB (ref 4.0–5.6)

## 2023-11-02 NOTE — Progress Notes (Signed)
 Established Patient Office Visit  Subjective   Patient ID: Eric Palmer, male    DOB: October 26, 1955  Age: 68 y.o. MRN: [id number]  Chief Complaint  Patient presents with   Diabetes    Dm 4 month Follow-up     HPI   Blayke has history of type 2 diabetes, hyperlipidemia, hypertension, very high coronary calcium score.  Follow-up with cardiology.  Recent lipids done with LDL 48.  He has been battling some low-grade nausea.  Had ultrasound showed fatty liver changes but no gallstones.  Saw GI.  They bumped his Prilosec to 40 mg daily.  Symptoms are slightly improved.  Does not have daily nausea symptoms.  He feels like overall this is improved.  No recent abdominal pain.  His job has required a lot of travel recently which has cut into his exercise.  He has gained a few pounds back.  Hopes to start back more diligent diet and exercise soon.  Last A1c was 7.0%.  He remains on Jardiance and metformin.  Past Medical History:  Diagnosis Date   Allergy    Anal fissure    Chronic bronchitis ([name])    "get it q spring and fall" (10/27/2013)   Coronary artery disease    a. s/p PCI in 1996. b. LHC 01/2006 showed 50% mid LAD, otherwise widely patent cors with minimal irregularities. c. Nuclear stress test in 04/2009 was reportedly low risk, no ischemia. // d. Nuclear stress test 2/19: EF 55, transient ST depression in recovery, no ischemia, inf defect (artifact vs scar)    DM type 2, uncontrolled, with neuropathy    ED (erectile dysfunction)    Essential hypertension    Hx of echocardiogram    Echo (10/2013): Mild LVH, moderate focal basal hypertrophy of the septum, EF 50-55%, no RWMA   Hyperlipidemia    Internal hemorrhoids without mention of complication    Obesity    Obesity    OSA on CPAP    Paresthesia of both legs    Paroxysmal atrial fibrillation ([name])    a. failed Tikosyn, Multaq. b. s/p ablation of AF/AFL in 12/2013.   Paroxysmal atrial flutter ([name])    a. s/p ablation of AF/AFL in  12/2013.   Sleep apnea    Smoker    QUIT 10/13/13   Tubular adenoma of colon 2016   Past Surgical History:  Procedure Laterality Date   ABLATION  01-12-2014   PVI and CTI by Dr Johney Frame   ANAL FISSURE REPAIR  ~ 2010   ATRIAL FIBRILLATION ABLATION N/A 01/12/2014   Procedure: ATRIAL FIBRILLATION ABLATION;  Surgeon: Gardiner Rhyme, MD;  Location: [facility] CATH LAB;  Service: Cardiovascular;  Laterality: N/A;   CARDIAC CATHETERIZATION  2007   CORONARY ANGIOPLASTY WITH STENT PLACEMENT  1996   residual 50% LAD and RCA by cath in 2007   CORONARY ANGIOPLASTY WITH STENT PLACEMENT  1995   INGUINAL HERNIA REPAIR Right 1963   LEFT HEART CATH AND CORONARY ANGIOGRAPHY N/A 10/11/2017   Procedure: LEFT HEART CATH AND CORONARY ANGIOGRAPHY;  Surgeon: Lyn Records, MD;  Location: [facility] INVASIVE CV LAB;  Service: Cardiovascular;  Laterality: N/A;   TEE WITHOUT CARDIOVERSION N/A 01/11/2014   Procedure: TRANSESOPHAGEAL ECHOCARDIOGRAM (TEE);  Surgeon: Vesta Mixer, MD;  Location: [facility] ENDOSCOPY;  Service: Cardiovascular;  Laterality: N/A;   TONSILLECTOMY AND ADENOIDECTOMY  1979    reports that he quit smoking about 5 years ago. His smoking use included cigarettes. He started smoking about 48 years  ago. He has a 43 pack-year smoking history. He has never used smokeless tobacco. He reports current alcohol use. He reports that he does not use drugs. family history includes Diabetes in his mother; Heart attack in his father; Heart disease in his brother and another family member; Heart disease (age of onset: 58) in his father; Heart disease (age of onset: 58) in his mother; Hyperlipidemia in an other family member; Hypertension in his mother. Allergies  Allergen Reactions   Cephalosporins Anaphylaxis   Penicillins Anaphylaxis and Other (See Comments)    Has patient had a PCN reaction causing immediate rash, facial/tongue/throat swelling, SOB or lightheadedness with hypotension: Yes Has patient had a PCN reaction causing  severe rash involving mucus membranes or skin necrosis: No Has patient had a PCN reaction that required hospitalization: No - went to doctors office Has patient had a PCN reaction occurring within the last 10 years: No If all of the above answers are "NO", then may proceed with Cephalosporin use.    Thallium Itching and Rash   Codeine Itching   Gabapentin Other (See Comments)    "made me feel drugged"   Eliquis [Apixaban] Diarrhea    Review of Systems  Constitutional:  Negative for malaise/fatigue.  Eyes:  Negative for blurred vision.  Respiratory:  Negative for shortness of breath.   Cardiovascular:  Negative for chest pain.  Neurological:  Negative for dizziness, weakness and headaches.      Objective:     BP 122/74 (BP Location: Left Arm, Patient Position: Sitting, Cuff Size: Normal)   Pulse 73   Temp (!) 97.5 F (36.4 C) (Oral)   Ht 6\' 1"  (1.854 m)   Wt 228 lb 8 oz (103.6 kg)   SpO2 95%   BMI 30.15 kg/m  BP Readings from Last 3 Encounters:  11/02/23 122/74  10/20/23 118/60  09/23/23 124/78   Wt Readings from Last 3 Encounters:  11/02/23 228 lb 8 oz (103.6 kg)  10/20/23 229 lb (103.9 kg)  09/23/23 232 lb (105.2 kg)      Physical Exam Vitals reviewed.  Constitutional:      Appearance: He is well-developed.  Neck:     Thyroid: No thyromegaly.  Cardiovascular:     Rate and Rhythm: Normal rate and regular rhythm.  Pulmonary:     Effort: Pulmonary effort is normal. No respiratory distress.     Breath sounds: Normal breath sounds. No wheezing or rales.  Musculoskeletal:     Cervical back: Neck supple.  Neurological:     Mental Status: He is alert and oriented to person, place, and time.      Results for orders placed or performed in visit on 11/02/23  POC HgB A1c  Result Value Ref Range   Hemoglobin A1C 7.5 (A) 4.0 - 5.6 %   HbA1c POC (<> result, manual entry)     HbA1c, POC (prediabetic range)     HbA1c, POC (controlled diabetic range)      Last  CBC Lab Results  Component Value Date   WBC 8.7 06/07/2023   HGB 16.6 06/07/2023   HCT 49.9 06/07/2023   MCV 91.6 06/07/2023   MCH 30.6 07/01/2020   RDW 13.3 06/07/2023   PLT 285.0 06/07/2023   Last metabolic panel Lab Results  Component Value Date   GLUCOSE 124 (H) 06/07/2023   NA 138 06/07/2023   K 4.5 06/07/2023   CL 102 06/07/2023   CO2 28 06/07/2023   BUN 16 06/07/2023   CREATININE 1.08  06/07/2023   GFR 71.56 06/07/2023   CALCIUM 9.6 06/07/2023   PROT 7.3 06/07/2023   ALBUMIN 4.4 06/07/2023   BILITOT 0.8 06/07/2023   ALKPHOS 51 06/07/2023   AST 13 06/07/2023   ALT 24 09/23/2023   ANIONGAP 7 08/15/2017   Last lipids Lab Results  Component Value Date   CHOL 111 09/23/2023   HDL 36 (L) 09/23/2023   LDLCALC 48 09/23/2023   LDLDIRECT 76.0 03/24/2023   TRIG 159 (H) 09/23/2023   CHOLHDL 3.1 09/23/2023   Last hemoglobin A1c Lab Results  Component Value Date   HGBA1C 7.5 (A) 11/02/2023      The ASCVD Risk score (Arnett DK, et al., 2019) failed to calculate for the following reasons:   The valid total cholesterol range is 130 to 320 mg/dL    Assessment & Plan:   #1 type 2 diabetes.  Suboptimal control today with A1c 7.5%.  This likely reflects his recent weight gain, decreased exercise, and increased travel with poor dietary compliance.  We gave him option of additional medication versus 3 months of lifestyle modification he prefers the latter.  Will see back in 3 months.  If A1c not closer to goal at that point consider additional medication  #2 hyperlipidemia treated with atorvastatin and Vascepa.  Recent lipids per cardiology with LDL 48   Return in about 3 months (around 02/02/2024).    Evelena Peat, MD

## 2023-11-02 NOTE — Patient Instructions (Signed)
 A1C today 7.5%.  Tighten up diet and exercise and let's plan on 3 month follow up .   If not to goal at that time we might need to look at additional medication.

## 2023-11-14 ENCOUNTER — Other Ambulatory Visit: Payer: Self-pay | Admitting: Family Medicine

## 2023-11-14 ENCOUNTER — Other Ambulatory Visit: Payer: Self-pay | Admitting: Cardiovascular Disease

## 2023-11-14 DIAGNOSIS — E1142 Type 2 diabetes mellitus with diabetic polyneuropathy: Secondary | ICD-10-CM

## 2023-11-15 ENCOUNTER — Other Ambulatory Visit: Payer: Self-pay

## 2023-11-15 ENCOUNTER — Other Ambulatory Visit (HOSPITAL_BASED_OUTPATIENT_CLINIC_OR_DEPARTMENT_OTHER): Payer: Self-pay

## 2023-11-15 MED ORDER — EMPAGLIFLOZIN 25 MG PO TABS
25.0000 mg | ORAL_TABLET | Freq: Every day | ORAL | 1 refills | Status: DC
  Filled 2023-11-15 – 2023-12-13 (×2): qty 90, 90d supply, fill #0

## 2023-11-16 ENCOUNTER — Other Ambulatory Visit (HOSPITAL_BASED_OUTPATIENT_CLINIC_OR_DEPARTMENT_OTHER): Payer: Self-pay

## 2023-11-16 ENCOUNTER — Other Ambulatory Visit: Payer: Self-pay

## 2023-11-16 MED ORDER — ICOSAPENT ETHYL 1 G PO CAPS
2.0000 g | ORAL_CAPSULE | Freq: Two times a day (BID) | ORAL | 0 refills | Status: DC
  Filled 2023-11-16: qty 360, 90d supply, fill #0

## 2023-11-22 ENCOUNTER — Other Ambulatory Visit (HOSPITAL_BASED_OUTPATIENT_CLINIC_OR_DEPARTMENT_OTHER): Payer: Self-pay

## 2023-12-04 ENCOUNTER — Other Ambulatory Visit (HOSPITAL_COMMUNITY): Payer: Self-pay

## 2023-12-04 ENCOUNTER — Other Ambulatory Visit (HOSPITAL_BASED_OUTPATIENT_CLINIC_OR_DEPARTMENT_OTHER): Payer: Self-pay

## 2023-12-10 NOTE — Progress Notes (Addendum)
 Cardiology Office Note    Patient Name: Eric Palmer Date of Encounter: 12/13/2023  Primary Care Provider:  Marquetta Sit, MD Primary Cardiologist:  Antoinette Batman, MD Primary Electrophysiologist: None   Past Medical History    Past Medical History:  Diagnosis Date   Allergy    Anal fissure    Chronic bronchitis ([name])    "get it q spring and fall" (10/27/2013)   Coronary artery disease    a. s/p PCI in 1996. b. LHC 01/2006 showed 50% mid LAD, otherwise widely patent cors with minimal irregularities. c. Nuclear stress test in 04/2009 was reportedly low risk, no ischemia. // d. Nuclear stress test 2/19: EF 55, transient ST depression in recovery, no ischemia, inf defect (artifact vs scar)    DM type 2, uncontrolled, with neuropathy    ED (erectile dysfunction)    Essential hypertension    Hx of echocardiogram    Echo (10/2013): Mild LVH, moderate focal basal hypertrophy of the septum, EF 50-55%, no RWMA   Hyperlipidemia    Internal hemorrhoids without mention of complication    Obesity    Obesity    OSA on CPAP    Paresthesia of both legs    Paroxysmal atrial fibrillation ([name])    a. failed Tikosyn , Multaq . b. s/p ablation of AF/AFL in 12/2013.   Paroxysmal atrial flutter ([name])    a. s/p ablation of AF/AFL in 12/2013.   Sleep apnea    Smoker    QUIT 10/13/13   Tubular adenoma of colon 2016    History of Present Illness  Eric Palmer is a 68 y.o. male with a PMH of CAD s/p PCI of RCA in 1996, paroxysmal AF s/p AF ablation 12/2013, OSA (on CPAP), former tobacco abuse, obesity, HLD, DM type II, HTN, who presents today for annual follow-up.  Eric Palmer was previously followed by Dr. Felipe Horton and is currently followed by Dr. Abel Hoe for management of CAD.  He has a previous PCI to the RCA that occurred in [place]  in 1996.  He underwent his last ischemic evaluation by LHC in 2019 that showed 50% proximal RCA in-stent restenosis with mid 50 to 70% narrowing of  LAD with consideration of rotational or orbital arthrectomy if intervention is necessary otherwise medical therapy.  CT calcium  score on 10/2023 that showed aortic atherosclerosis with total calcium  score of 2232 with significant elevation in the LAD and RCA as well as circumflex.  He is followed by Dr. Micael Adas for sleep apnea management and previously was followed by Dr. Jolly Needle for management of AF that was first diagnosed in 2013.  He failed medical therapy with Multaq  as well as Tikosyn  and underwent catheter ablation 12/2020.  He is currently treated on Xarelto  chronically and was last seen by Dr. Micael Adas on 09/23/2023.  He had lost a total of 35 pounds and stopped using CPAP device with HST demonstrating no sleep apnea and device no longer needed.  He demonstrated stable BP and was maintaining sinus rhythm during visit.  He is currently on rate control with Cardizem  360 mg daily and sotalol  80 mg twice daily.  Eric Palmer presents today for his annual follow-up.  Since his previous visit he reports chest tightness that is not associated with physical activity, such as exercise or walking. The tightness occurs while sitting or lying down and is described as 'almost like asthmatic.' No pain radiates to the arm or jaw. He has been experiencing frequent urination, particularly at night, which  disrupts his sleep. He was initially taking Jardiance  25 mg in the evening but switched to taking it in the morning due to nocturia.  His blood pressure today was stable at 108/68 and heart rate 68 bpm.  Despite the change, he continues to experience the same symptoms. No irritation or signs of urinary tract infection are present, but there is a persistent sensation of needing to urinate even after voiding. He has a history of type 2 diabetes and mentions that his A1c levels tend to improve with exercise. He is mindful of his diet, avoiding sugar, pasta, and rice, and drinks zero-calorie beverages. He has been a smoker for  40 years but quit five or six years ago. He also reports a history of atrial fibrillation, which he can usually manage by resting when he feels symptoms. He has a family history of coronary artery disease, with his father having died at 58 and other family members experiencing coronary events in their 39s. Patient denies chest pain, palpitations, dyspnea, PND, orthopnea, nausea, vomiting, dizziness, syncope, edema, weight gain, or early satiety.  Discussed the use of AI scribe software for clinical note transcription with the patient, who gave verbal consent to proceed.  History of Present Illness    Review of Systems  Please see the history of present illness.    All other systems reviewed and are otherwise negative except as noted above.  Physical Exam    Wt Readings from Last 3 Encounters:  12/13/23 230 lb 6.4 oz (104.5 kg)  11/02/23 228 lb 8 oz (103.6 kg)  10/20/23 229 lb (103.9 kg)   VS: Vitals:   12/13/23 0800  BP: 108/68  Pulse: 68  SpO2: 95%  ,Body mass index is 30.4 kg/m. GEN: Well nourished, well developed in no acute distress Neck: No JVD; No carotid bruits Pulmonary: Clear to auscultation without rales, wheezing or rhonchi  Cardiovascular: Normal rate. Regular rhythm. Normal S1. Normal S2.   Murmurs: There is no murmur.  ABDOMEN: Soft, non-tender, non-distended EXTREMITIES:  No edema; No deformity chronic varicose veins with mottling  EKG/LABS/ Recent Cardiac Studies   ECG personally reviewed by me today -none completed today  Risk Assessment/Calculations:    CHA2DS2-VASc Score = 4   This indicates a 4.8% annual risk of stroke. The patient's score is based upon: CHF History: 0 HTN History: 1 Diabetes History: 1 Stroke History: 0 Vascular Disease History: 1 Age Score: 1 Gender Score: 0     STOP-Bang Score:         Lab Results  Component Value Date   WBC 8.7 06/07/2023   HGB 16.6 06/07/2023   HCT 49.9 06/07/2023   MCV 91.6 06/07/2023   PLT 285.0  06/07/2023   Lab Results  Component Value Date   CREATININE 1.08 06/07/2023   BUN 16 06/07/2023   NA 138 06/07/2023   K 4.5 06/07/2023   CL 102 06/07/2023   CO2 28 06/07/2023   Lab Results  Component Value Date   CHOL 111 09/23/2023   HDL 36 (L) 09/23/2023   LDLCALC 48 09/23/2023   LDLDIRECT 76.0 03/24/2023   TRIG 159 (H) 09/23/2023   CHOLHDL 3.1 09/23/2023  Vincent times Ranges just like you know still weight and unlike 1 to be away yellow and so I am telling myself to Lab Results  Component Value Date   HGBA1C 7.5 (A) 11/02/2023   Assessment & Plan    1.  Coronary artery disease: - Patient underwent CT calcium  score with elevated  score of 2232 - Today patient reports intermittent chest tightness atypical for angina but concerning due to coronary artery disease and restenosis - Order PET stress test to evaluate heart muscle and blood flow. - Patient advised to use as needed Nitrostat  if chest discomfort occurs and is not relieved with rest. - Continue current GDMT with Lipitor 40 mg, Vascepa  2 g twice daily, as needed Nitrostat  0.4 mg - We will complete LP(a) today  2.  Paroxysmal AF: - Patient is currently rate controlled at 68 bpm -Continue Cardizem  360 mg daily and sotalol  80 mg twice daily - Patient's creatinine clearance is 99 mL/min - Continue Xarelto  20 mg daily -- Advise on caffeine moderation to reduce AFib triggers. - Consider use of smartwatch to monitor for irregular heartbeats.  3.  Essential hypertension: - Patient's blood pressure today was 108/68 - Continue Cardizem  360 mg daily and sotalol  80 mg twice daily  4.  Hyperlipidemia: - Patient's last LDL cholesterol was 76 - Due to family history of premature CAD and increased calcium  on CT we will check LP(a) - Continue Lipitor 40 mg and Vascepa  2 g twice daily  5.  DM type II: - Jardiance  25 mg causing nocturia and incomplete bladder emptying. -Discussed reducing dose to alleviate symptoms. - Reduce  Jardiance  dose to 10 mg and provide a 30-day supply. - Monitor for improvement in nocturia and sleep quality. - Consider alternative treatments if symptoms persist.  6.  Shortness of breath: - Patient reports shortness of breath with chest heaviness which is relieved with rest and as needed Xopenex - He also notes shortness of breath as anginal equivalent prior to previous stent in 1996 - Patient will complete nuclear PET stress test as noted above   Disposition: Follow-up with Antoinette Batman, MD or APP in 3 months Informed Consent   Shared Decision Making/Informed Consent The risks [chest pain, shortness of breath, cardiac arrhythmias, dizziness, blood pressure fluctuations, myocardial infarction, stroke/transient ischemic attack, nausea, vomiting, allergic reaction, radiation exposure, metallic taste sensation and life-threatening complications (estimated to be 1 in 10,000)], benefits (risk stratification, diagnosing coronary artery disease, treatment guidance) and alternatives of a cardiac PET stress test were discussed in detail with Eric Palmer and he agrees to proceed.      Signed, Francene Ing, Retha Cast, NP 12/13/2023, 8:54 AM [facility]

## 2023-12-13 ENCOUNTER — Other Ambulatory Visit (HOSPITAL_BASED_OUTPATIENT_CLINIC_OR_DEPARTMENT_OTHER): Payer: Self-pay

## 2023-12-13 ENCOUNTER — Other Ambulatory Visit: Payer: Self-pay | Admitting: Cardiovascular Disease

## 2023-12-13 ENCOUNTER — Other Ambulatory Visit: Payer: Self-pay

## 2023-12-13 ENCOUNTER — Other Ambulatory Visit: Payer: Self-pay | Admitting: Family Medicine

## 2023-12-13 ENCOUNTER — Encounter: Payer: Self-pay | Admitting: Nurse Practitioner

## 2023-12-13 ENCOUNTER — Ambulatory Visit: Payer: Medicare Other | Attending: Nurse Practitioner | Admitting: Nurse Practitioner

## 2023-12-13 VITALS — BP 108/68 | HR 68 | Ht 73.0 in | Wt 230.4 lb

## 2023-12-13 DIAGNOSIS — I2510 Atherosclerotic heart disease of native coronary artery without angina pectoris: Secondary | ICD-10-CM | POA: Diagnosis not present

## 2023-12-13 DIAGNOSIS — E1142 Type 2 diabetes mellitus with diabetic polyneuropathy: Secondary | ICD-10-CM | POA: Diagnosis not present

## 2023-12-13 DIAGNOSIS — I480 Paroxysmal atrial fibrillation: Secondary | ICD-10-CM

## 2023-12-13 DIAGNOSIS — R0602 Shortness of breath: Secondary | ICD-10-CM

## 2023-12-13 DIAGNOSIS — I10 Essential (primary) hypertension: Secondary | ICD-10-CM | POA: Diagnosis not present

## 2023-12-13 MED ORDER — ICOSAPENT ETHYL 1 G PO CAPS
2.0000 g | ORAL_CAPSULE | Freq: Two times a day (BID) | ORAL | 3 refills | Status: AC
  Filled 2023-12-13 – 2024-02-11 (×2): qty 360, 90d supply, fill #0
  Filled 2024-05-15: qty 360, 90d supply, fill #1
  Filled 2024-08-11: qty 360, 90d supply, fill #2

## 2023-12-13 MED ORDER — ATORVASTATIN CALCIUM 40 MG PO TABS
40.0000 mg | ORAL_TABLET | Freq: Every day | ORAL | 3 refills | Status: AC
  Filled 2023-12-13 – 2024-01-14 (×2): qty 90, 90d supply, fill #0
  Filled 2024-04-25: qty 90, 90d supply, fill #1
  Filled 2024-07-26: qty 90, 90d supply, fill #2

## 2023-12-13 MED ORDER — NITROGLYCERIN 0.4 MG SL SUBL
SUBLINGUAL_TABLET | SUBLINGUAL | 3 refills | Status: AC
  Filled 2023-12-13: qty 25, fill #0
  Filled 2024-01-14: qty 25, 8d supply, fill #0
  Filled 2024-06-10: qty 25, 8d supply, fill #1

## 2023-12-13 MED ORDER — RIVAROXABAN 20 MG PO TABS
20.0000 mg | ORAL_TABLET | Freq: Every day | ORAL | 1 refills | Status: DC
  Filled 2023-12-13: qty 90, 90d supply, fill #0

## 2023-12-13 MED ORDER — EMPAGLIFLOZIN 10 MG PO TABS
10.0000 mg | ORAL_TABLET | Freq: Every day | ORAL | 2 refills | Status: DC
  Filled 2023-12-13 – 2023-12-17 (×2): qty 30, 30d supply, fill #0
  Filled 2024-01-14: qty 30, 30d supply, fill #1
  Filled 2024-02-11 – 2024-02-12 (×2): qty 30, 30d supply, fill #2

## 2023-12-13 MED ORDER — TADALAFIL 20 MG PO TABS
20.0000 mg | ORAL_TABLET | Freq: Every day | ORAL | 3 refills | Status: AC | PRN
  Filled 2023-12-13: qty 10, 10d supply, fill #0

## 2023-12-13 MED ORDER — SOTALOL HCL 80 MG PO TABS
80.0000 mg | ORAL_TABLET | Freq: Two times a day (BID) | ORAL | 3 refills | Status: AC
  Filled 2023-12-13 – 2024-02-26 (×2): qty 180, 90d supply, fill #0
  Filled 2024-05-15: qty 180, 90d supply, fill #1
  Filled 2024-08-25: qty 180, 90d supply, fill #2

## 2023-12-13 MED ORDER — DILTIAZEM HCL ER COATED BEADS 360 MG PO CP24
360.0000 mg | ORAL_CAPSULE | Freq: Every day | ORAL | 3 refills | Status: AC
Start: 2023-12-13 — End: ?
  Filled 2023-12-13 – 2024-01-14 (×2): qty 90, 90d supply, fill #0
  Filled 2024-04-11: qty 90, 90d supply, fill #1
  Filled 2024-07-07: qty 90, 90d supply, fill #2

## 2023-12-13 MED ORDER — RIVAROXABAN 20 MG PO TABS
20.0000 mg | ORAL_TABLET | Freq: Every day | ORAL | 3 refills | Status: AC
  Filled 2023-12-25 (×2): qty 90, 90d supply, fill #0
  Filled 2024-03-25: qty 90, 90d supply, fill #1
  Filled 2024-06-20: qty 90, 90d supply, fill #2
  Filled 2024-09-22: qty 90, 90d supply, fill #3

## 2023-12-13 NOTE — Patient Instructions (Addendum)
 Medication Instructions:  DECREASE Jardiance  to 10mg  Take 1 tablet once a day  *If you need a refill on your cardiac medications before your next appointment, please call your pharmacy*  Lab Work: TODAY-[facility]  If you have labs (blood work) drawn today and your tests are completely normal, you will receive your results only by: MyChart Message (if you have MyChart) OR A paper copy in the mail If you have any lab test that is abnormal or we need to change your treatment, we will call you to review the results.  Testing/Procedures: CARDIAC PET STRESS TEST  REMIND YOUR FAMILY TO DO A CORONARY CALCIUM  SCORE  Follow-Up: At [facility], you and your health needs are our priority.  As part of our continuing mission to provide you with exceptional heart care, our providers are all part of one team.  This team includes your primary Cardiologist (physician) and Advanced Practice Providers or APPs (Physician Assistants and Nurse Practitioners) who all work together to provide you with the care you need, when you need it.  Your next appointment:   3 month(s)  Provider:   Charles Connor, NP   We recommend signing up for the patient portal called "MyChart".  Sign up information is provided on this After Visit Summary.  MyChart is used to connect with patients for Virtual Visits (Telemedicine).  Patients are able to view lab/test results, encounter notes, upcoming appointments, etc.  Non-urgent messages can be sent to your provider as well.   To learn more about what you can do with MyChart, go to [email or web address].   Other Instructions:     Please report to Radiology at the [facility] Main Entrance 30 minutes early for your test.  [address] [place], [place] [address]                  How to Prepare for Your Cardiac PET/CT Stress Test:  Nothing to eat or drink, except water, 3 hours prior to arrival time.  NO caffeine/decaffeinated products, or chocolate 12 hours  prior to arrival. (Please note decaffeinated beverages (teas/coffees) still contain caffeine).  If you have caffeine within 12 hours prior, the test will need to be rescheduled.  Medication instructions: Do not take erectile dysfunction medications for 72 hours prior to test (sildenafil, tadalafil ) Do not take nitrates (isosorbide mononitrate, Ranexa) the day before or day of test Do not take tamsulosin the day before or morning of test Hold theophylline containing medications for 12 hours. Hold Dipyridamole 48 hours prior to the test.  Diabetic Preparation: If able to eat breakfast prior to 3 hour fasting, you may take all medications, including your insulin . Do not worry if you miss your breakfast dose of insulin  - start at your next meal. If you do not eat prior to 3 hour fast-Hold all diabetes (oral and insulin ) medications. Patients who wear a continuous glucose monitor MUST remove the device prior to scanning.  You may take your remaining medications with water.  NO perfume, cologne or lotion on chest or abdomen area.   Total time is 1 to 2 hours; you may want to bring reading material for the waiting time.   In preparation for your appointment, medication and supplies will be purchased.  Appointment availability is limited, so if you need to cancel or reschedule, please call the Radiology Department Scheduler at [phone] 24 hours in advance to avoid a cancellation fee of $100.00  What to Expect When you Arrive:  Once you  arrive and check in for your appointment, you will be taken to a preparation room within the Radiology Department.  A technologist or Nurse will obtain your medical history, verify that you are correctly prepped for the exam, and explain the procedure.  Afterwards, an IV will be started in your arm and electrodes will be placed on your skin for EKG monitoring during the stress portion of the exam. Then you will be escorted to the PET/CT scanner.  There, staff  will get you positioned on the scanner and obtain a blood pressure and EKG.  During the exam, you will continue to be connected to the EKG and blood pressure machines.  A small, safe amount of a radioactive tracer will be injected in your IV to obtain a series of pictures of your heart along with an injection of a stress agent.    After your Exam:  It is recommended that you eat a meal and drink a caffeinated beverage to counter act any effects of the stress agent.  Drink plenty of fluids for the remainder of the day and urinate frequently for the first couple of hours after the exam.  Your doctor will inform you of your test results within 7-10 business days.  For more information and frequently asked questions, please visit our website: [email or web address]  For questions about your test or how to prepare for your test, please call: Cardiac Imaging Nurse Navigators Office: [phone]       1st Floor: - Lobby - Registration  - Pharmacy  - Lab - Cafe  2nd Floor: - PV Lab - Diagnostic Testing (echo, CT, nuclear med)  3rd Floor: - Vacant  4th Floor: - TCTS (cardiothoracic surgery) - AFib Clinic - Structural Heart Clinic - Vascular Surgery  - Vascular Ultrasound  5th Floor: - HeartCare Cardiology (general and EP) - Clinical Pharmacy for coumadin , hypertension, lipid, weight-loss medications, and med management appointments    Valet parking services will be available as well.

## 2023-12-13 NOTE — Telephone Encounter (Signed)
 Prescription refill request for Xarelto  received.  Indication: Afib  Last office visit: 09/23/23([facility])  Weight: 103.6kg Age: 68 Scr: 1.08 (06/07/23)  CrCl: 98.59ml/min  Appropriate dose. Refill sent.

## 2023-12-13 NOTE — Addendum Note (Signed)
 Addended by: Ritchie Cheshire on: 12/13/2023 09:43 AM   Modules accepted: Orders

## 2023-12-14 ENCOUNTER — Other Ambulatory Visit (HOSPITAL_BASED_OUTPATIENT_CLINIC_OR_DEPARTMENT_OTHER): Payer: Self-pay

## 2023-12-14 ENCOUNTER — Other Ambulatory Visit: Payer: Self-pay

## 2023-12-15 ENCOUNTER — Other Ambulatory Visit (HOSPITAL_BASED_OUTPATIENT_CLINIC_OR_DEPARTMENT_OTHER): Payer: Self-pay

## 2023-12-15 LAB — LIPOPROTEIN A (LPA): Lipoprotein (a): 13.9 nmol/L (ref <75.0)

## 2023-12-15 MED ORDER — METFORMIN HCL ER 750 MG PO TB24
750.0000 mg | ORAL_TABLET | Freq: Two times a day (BID) | ORAL | 1 refills | Status: DC
  Filled 2023-12-15: qty 180, 90d supply, fill #0
  Filled 2024-03-12: qty 180, 90d supply, fill #1

## 2023-12-15 MED ORDER — AZELASTINE HCL 0.1 % NA SOLN
2.0000 | Freq: Two times a day (BID) | NASAL | 2 refills | Status: DC
  Filled 2023-12-15: qty 30, 50d supply, fill #0
  Filled 2024-04-11: qty 30, 50d supply, fill #1
  Filled 2024-06-10: qty 30, 50d supply, fill #2

## 2023-12-17 ENCOUNTER — Other Ambulatory Visit (HOSPITAL_BASED_OUTPATIENT_CLINIC_OR_DEPARTMENT_OTHER): Payer: Self-pay

## 2023-12-25 ENCOUNTER — Other Ambulatory Visit (HOSPITAL_BASED_OUTPATIENT_CLINIC_OR_DEPARTMENT_OTHER): Payer: Self-pay

## 2023-12-30 DIAGNOSIS — C44519 Basal cell carcinoma of skin of other part of trunk: Secondary | ICD-10-CM | POA: Diagnosis not present

## 2023-12-30 DIAGNOSIS — D485 Neoplasm of uncertain behavior of skin: Secondary | ICD-10-CM | POA: Diagnosis not present

## 2023-12-30 DIAGNOSIS — C4441 Basal cell carcinoma of skin of scalp and neck: Secondary | ICD-10-CM | POA: Diagnosis not present

## 2023-12-30 DIAGNOSIS — L570 Actinic keratosis: Secondary | ICD-10-CM | POA: Diagnosis not present

## 2023-12-30 DIAGNOSIS — C44612 Basal cell carcinoma of skin of right upper limb, including shoulder: Secondary | ICD-10-CM | POA: Diagnosis not present

## 2023-12-30 DIAGNOSIS — D2239 Melanocytic nevi of other parts of face: Secondary | ICD-10-CM | POA: Diagnosis not present

## 2023-12-30 DIAGNOSIS — D225 Melanocytic nevi of trunk: Secondary | ICD-10-CM | POA: Diagnosis not present

## 2024-01-14 ENCOUNTER — Other Ambulatory Visit (HOSPITAL_BASED_OUTPATIENT_CLINIC_OR_DEPARTMENT_OTHER): Payer: Self-pay

## 2024-01-14 ENCOUNTER — Other Ambulatory Visit: Payer: Self-pay | Admitting: Family Medicine

## 2024-01-14 ENCOUNTER — Other Ambulatory Visit: Payer: Self-pay

## 2024-01-14 MED ORDER — ALBUTEROL SULFATE HFA 108 (90 BASE) MCG/ACT IN AERS
2.0000 | INHALATION_SPRAY | RESPIRATORY_TRACT | 1 refills | Status: DC | PRN
  Filled 2024-01-14: qty 6.7, 17d supply, fill #0
  Filled 2024-03-12: qty 6.7, 17d supply, fill #1

## 2024-01-14 MED ORDER — ONDANSETRON HCL 8 MG PO TABS
8.0000 mg | ORAL_TABLET | Freq: Three times a day (TID) | ORAL | 0 refills | Status: DC | PRN
  Filled 2024-01-14: qty 15, 5d supply, fill #0

## 2024-01-24 ENCOUNTER — Encounter: Payer: Self-pay | Admitting: Family Medicine

## 2024-01-24 ENCOUNTER — Ambulatory Visit (INDEPENDENT_AMBULATORY_CARE_PROVIDER_SITE_OTHER): Admitting: Family Medicine

## 2024-01-24 VITALS — BP 118/72 | HR 72 | Temp 98.0°F | Wt 229.6 lb

## 2024-01-24 DIAGNOSIS — R062 Wheezing: Secondary | ICD-10-CM | POA: Diagnosis not present

## 2024-01-24 DIAGNOSIS — J309 Allergic rhinitis, unspecified: Secondary | ICD-10-CM | POA: Diagnosis not present

## 2024-01-24 DIAGNOSIS — R0982 Postnasal drip: Secondary | ICD-10-CM | POA: Diagnosis not present

## 2024-01-24 MED ORDER — METHYLPREDNISOLONE ACETATE 80 MG/ML IJ SUSP
80.0000 mg | Freq: Once | INTRAMUSCULAR | Status: AC
  Administered 2024-01-24: 80 mg via INTRAMUSCULAR

## 2024-01-24 NOTE — Patient Instructions (Signed)
 Consider getting back on the Astelin  nasal spray twice daily.

## 2024-01-24 NOTE — Progress Notes (Signed)
 Established Patient Office Visit  Subjective   Patient ID: Eric Palmer, male    DOB: 05-07-1956  Age: 68 y.o. MRN: [id number]  Chief Complaint  Patient presents with   Wheezing   Hoarse   Cough   Sore Throat    HPI   Eric Palmer is seen with 2-week history of some hoarseness, intermittent sore throat, intermittent wheezing and occasional cough.  He thinks this is allergy related.  He has had allergy issues most of his life.  He received allergy shots through his teen years.  He thinks he has some grass allergies.  Currently takes Allegra.  Also uses Advair  regularly.  Frequent postnasal drip symptoms.  Has used Astelin  in the past but feels like he has some throat irritation when he uses this although this does help his nasal symptoms.  Denies any recent fever or bodyaches.  He has benefited from Depo-Medrol  in the past for similar symptoms.  Does have type 2 diabetes.  Due for follow-up A1c soon  Past Medical History:  Diagnosis Date   Allergy    Anal fissure    Chronic bronchitis ([name])    "get it q spring and fall" (10/27/2013)   Coronary artery disease    a. s/p PCI in 1996. b. LHC 01/2006 showed 50% mid LAD, otherwise widely patent cors with minimal irregularities. c. Nuclear stress test in 04/2009 was reportedly low risk, no ischemia. // d. Nuclear stress test 2/19: EF 55, transient ST depression in recovery, no ischemia, inf defect (artifact vs scar)    DM type 2, uncontrolled, with neuropathy    ED (erectile dysfunction)    Essential hypertension    Hx of echocardiogram    Echo (10/2013): Mild LVH, moderate focal basal hypertrophy of the septum, EF 50-55%, no RWMA   Hyperlipidemia    Internal hemorrhoids without mention of complication    Obesity    Obesity    OSA on CPAP    Paresthesia of both legs    Paroxysmal atrial fibrillation ([name])    a. failed Tikosyn , Multaq . b. s/p ablation of AF/AFL in 12/2013.   Paroxysmal atrial flutter ([name])    a. s/p ablation of AF/AFL in  12/2013.   Sleep apnea    Smoker    QUIT 10/13/13   Tubular adenoma of colon 2016   Past Surgical History:  Procedure Laterality Date   ABLATION  01-12-2014   PVI and CTI by Dr Nunzio Belch   ANAL FISSURE REPAIR  ~ 2010   ATRIAL FIBRILLATION ABLATION N/A 01/12/2014   Procedure: ATRIAL FIBRILLATION ABLATION;  Surgeon: Ellaree Gunther, MD;  Location: [facility] CATH LAB;  Service: Cardiovascular;  Laterality: N/A;   CARDIAC CATHETERIZATION  2007   CORONARY ANGIOPLASTY WITH STENT PLACEMENT  1996   residual 50% LAD and RCA by cath in 2007   CORONARY ANGIOPLASTY WITH STENT PLACEMENT  1995   INGUINAL HERNIA REPAIR Right 1963   LEFT HEART CATH AND CORONARY ANGIOGRAPHY N/A 10/11/2017   Procedure: LEFT HEART CATH AND CORONARY ANGIOGRAPHY;  Surgeon: Arty Binning, MD;  Location: [facility] INVASIVE CV LAB;  Service: Cardiovascular;  Laterality: N/A;   TEE WITHOUT CARDIOVERSION N/A 01/11/2014   Procedure: TRANSESOPHAGEAL ECHOCARDIOGRAM (TEE);  Surgeon: Lake Pilgrim, MD;  Location: [facility] ENDOSCOPY;  Service: Cardiovascular;  Laterality: N/A;   TONSILLECTOMY AND ADENOIDECTOMY  1979    reports that he quit smoking about 5 years ago. His smoking use included cigarettes. He started smoking about 48 years ago. He has  a 43 pack-year smoking history. He has never used smokeless tobacco. He reports current alcohol use. He reports that he does not use drugs. family history includes Diabetes in his mother; Heart attack in his father; Heart disease in his brother and another family member; Heart disease (age of onset: 60) in his father; Heart disease (age of onset: 25) in his mother; Hyperlipidemia in an other family member; Hypertension in his mother. Allergies  Allergen Reactions   Cephalosporins Anaphylaxis   Penicillins Anaphylaxis and Other (See Comments)    Has patient had a PCN reaction causing immediate rash, facial/tongue/throat swelling, SOB or lightheadedness with hypotension: Yes Has patient had a PCN reaction causing  severe rash involving mucus membranes or skin necrosis: No Has patient had a PCN reaction that required hospitalization: No - went to doctors office Has patient had a PCN reaction occurring within the last 10 years: No If all of the above answers are "NO", then may proceed with Cephalosporin use.    Thallium Itching and Rash   Codeine Itching   Gabapentin  Other (See Comments)    "made me feel drugged"   Eliquis  [Apixaban ] Diarrhea    Review of Systems  Constitutional:  Negative for chills and fever.  HENT:  Positive for sore throat. Negative for sinus pain.   Respiratory:  Positive for cough and wheezing.   Neurological:  Negative for headaches.      Objective:     BP 118/72 (BP Location: Left Arm, Patient Position: Sitting, Cuff Size: Normal)   Pulse 72   Temp 98 F (36.7 C) (Oral)   Wt 229 lb 9.6 oz (104.1 kg)   SpO2 94%   BMI 30.29 kg/m  BP Readings from Last 3 Encounters:  01/24/24 118/72  12/13/23 108/68  11/02/23 122/74   Wt Readings from Last 3 Encounters:  01/24/24 229 lb 9.6 oz (104.1 kg)  12/13/23 230 lb 6.4 oz (104.5 kg)  11/02/23 228 lb 8 oz (103.6 kg)      Physical Exam Vitals reviewed.  Constitutional:      General: He is not in acute distress.    Appearance: He is well-developed. He is not ill-appearing.  HENT:     Right Ear: Tympanic membrane normal.     Left Ear: Tympanic membrane normal.     Mouth/Throat:     Comments: History of prior tonsillectomy.  No significant erythema posterior pharynx and no visible exudate. Cardiovascular:     Rate and Rhythm: Normal rate and regular rhythm.  Pulmonary:     Effort: Pulmonary effort is normal.     Comments: No rales.  Only a few faint wheezes.  No retractions.  O2 sat 94% room air Musculoskeletal:     Cervical back: Neck supple.  Lymphadenopathy:     Cervical: No cervical adenopathy.  Neurological:     Mental Status: He is alert.      No results found for any visits on 01/24/24.    The  ASCVD Risk score (Arnett DK, et al., 2019) failed to calculate for the following reasons:   The valid total cholesterol range is 130 to 320 mg/dL    Assessment & Plan:   Eric Palmer presents with upper respiratory symptoms of intermittent hoarseness, sore throat, wheezing, cough.  Suspect allergic postnasal drip related.  Continue Allegra.  Depo-Medrol  80 mg IM given per patient request.  Start back Astelin  nasal.  Suggested drinking some liquid after using this to avoid any throat irritation.  Be in touch next couple  weeks if not improving with the above   No follow-ups on file.    Glean Lamy, MD

## 2024-01-27 ENCOUNTER — Other Ambulatory Visit (HOSPITAL_BASED_OUTPATIENT_CLINIC_OR_DEPARTMENT_OTHER): Payer: Self-pay

## 2024-01-27 DIAGNOSIS — C44319 Basal cell carcinoma of skin of other parts of face: Secondary | ICD-10-CM | POA: Diagnosis not present

## 2024-01-27 MED ORDER — DOXYCYCLINE HYCLATE 100 MG PO CAPS
100.0000 mg | ORAL_CAPSULE | Freq: Two times a day (BID) | ORAL | 0 refills | Status: DC
  Filled 2024-01-27: qty 10, 5d supply, fill #0

## 2024-02-12 ENCOUNTER — Other Ambulatory Visit (HOSPITAL_BASED_OUTPATIENT_CLINIC_OR_DEPARTMENT_OTHER): Payer: Self-pay

## 2024-02-26 ENCOUNTER — Other Ambulatory Visit (HOSPITAL_BASED_OUTPATIENT_CLINIC_OR_DEPARTMENT_OTHER): Payer: Self-pay

## 2024-03-06 ENCOUNTER — Encounter (HOSPITAL_COMMUNITY): Payer: Self-pay

## 2024-03-07 ENCOUNTER — Telehealth (HOSPITAL_COMMUNITY): Payer: Self-pay | Admitting: *Deleted

## 2024-03-07 NOTE — Telephone Encounter (Signed)
 Reaching out to patient to offer assistance regarding upcoming cardiac imaging study; pt verbalizes understanding of appt date/time, parking situation and where to check in, pre-test NPO status, and verified current allergies; name and call back number provided for further questions should they arise  Eric Brick RN Navigator Cardiac Imaging [facility] [phone] office [phone] cell  Patient aware to avoid caffeine 12 hours prior to his cardiac PET scan.

## 2024-03-08 ENCOUNTER — Encounter (HOSPITAL_BASED_OUTPATIENT_CLINIC_OR_DEPARTMENT_OTHER): Payer: Self-pay | Admitting: Emergency Medicine

## 2024-03-08 ENCOUNTER — Encounter (HOSPITAL_BASED_OUTPATIENT_CLINIC_OR_DEPARTMENT_OTHER)
Admission: RE | Admit: 2024-03-08 | Discharge: 2024-03-08 | Disposition: A | Discharge status: Home / Self Care | Source: Ambulatory Visit | Attending: Nurse Practitioner | Admitting: Nurse Practitioner

## 2024-03-08 ENCOUNTER — Other Ambulatory Visit (HOSPITAL_BASED_OUTPATIENT_CLINIC_OR_DEPARTMENT_OTHER): Payer: Self-pay

## 2024-03-08 ENCOUNTER — Telehealth: Payer: Self-pay | Admitting: Cardiovascular Disease

## 2024-03-08 ENCOUNTER — Emergency Department (HOSPITAL_BASED_OUTPATIENT_CLINIC_OR_DEPARTMENT_OTHER)
Admission: EM | Admit: 2024-03-08 | Discharge: 2024-03-08 | Disposition: A | Discharge status: Home / Self Care | Source: Ambulatory Visit | Attending: Emergency Medicine | Admitting: Emergency Medicine

## 2024-03-08 ENCOUNTER — Ambulatory Visit: Payer: Self-pay

## 2024-03-08 ENCOUNTER — Other Ambulatory Visit: Payer: Self-pay

## 2024-03-08 ENCOUNTER — Ambulatory Visit: Payer: Self-pay | Admitting: Nurse Practitioner

## 2024-03-08 DIAGNOSIS — I480 Paroxysmal atrial fibrillation: Secondary | ICD-10-CM

## 2024-03-08 DIAGNOSIS — I10 Essential (primary) hypertension: Secondary | ICD-10-CM

## 2024-03-08 DIAGNOSIS — J189 Pneumonia, unspecified organism: Secondary | ICD-10-CM | POA: Diagnosis not present

## 2024-03-08 DIAGNOSIS — E1142 Type 2 diabetes mellitus with diabetic polyneuropathy: Secondary | ICD-10-CM

## 2024-03-08 DIAGNOSIS — I2510 Atherosclerotic heart disease of native coronary artery without angina pectoris: Secondary | ICD-10-CM

## 2024-03-08 DIAGNOSIS — R0602 Shortness of breath: Secondary | ICD-10-CM | POA: Diagnosis not present

## 2024-03-08 LAB — NM PET CT CARDIAC PERFUSION MULTI W/ABSOLUTE BLOODFLOW
LV dias vol: 85 mL (ref 62–150)
LV sys vol: 38 mL (ref 4.2–5.8)
MBFR: 2.56
Nuc Rest EF: 55 %
Nuc Stress EF: 59 %
Peak HR: 88 {beats}/min
Rest HR: 71 {beats}/min
Rest MBF: 0.62 ml/g/min
Rest Nuclear Isotope Dose: 26.9 mCi
ST Depression (mm): 0 mm
Stress MBF: 1.59 ml/g/min
Stress Nuclear Isotope Dose: 27.4 mCi

## 2024-03-08 MED ORDER — REGADENOSON 0.4 MG/5ML IV SOLN
0.4000 mg | Freq: Once | INTRAVENOUS | Status: AC
  Administered 2024-03-08: 0.4 mg via INTRAVENOUS

## 2024-03-08 MED ORDER — REGADENOSON 0.4 MG/5ML IV SOLN
INTRAVENOUS | Status: AC
Start: 2024-03-08 — End: 2024-03-08
  Filled 2024-03-08: qty 5

## 2024-03-08 MED ORDER — RUBIDIUM RB82 GENERATOR (RUBYFILL)
26.9000 | PACK | Freq: Once | INTRAVENOUS | Status: AC
  Administered 2024-03-08: 26.9 via INTRAVENOUS

## 2024-03-08 MED ORDER — REGADENOSON 0.4 MG/5ML IV SOLN
INTRAVENOUS | Status: AC
  Filled 2024-03-08: qty 5

## 2024-03-08 MED ORDER — RUBIDIUM RB82 GENERATOR (RUBYFILL)
27.4000 | PACK | Freq: Once | INTRAVENOUS | Status: AC
  Administered 2024-03-08: 27.4 via INTRAVENOUS

## 2024-03-08 MED ORDER — DOXYCYCLINE HYCLATE 100 MG PO CAPS
100.0000 mg | ORAL_CAPSULE | Freq: Two times a day (BID) | ORAL | 0 refills | Status: AC
  Filled 2024-03-08: qty 14, 7d supply, fill #0

## 2024-03-08 NOTE — Telephone Encounter (Signed)
 Received a call from Radiology--  Results - of the impressions of PET Scan  IMPRESSION: 1. Airway thickening is present, suggesting bronchitis or reactive airways disease. 2. New hazy airspace opacity in the left lower lobe, suggesting alveolitis and potentially early pneumonia   Will forward to provider who ordered the radiology testing. CHARLENA Alberts NP

## 2024-03-08 NOTE — ED Provider Notes (Signed)
 [place] EMERGENCY DEPARTMENT AT [facility] Provider Note   CSN: [id number] Arrival date & time: 03/08/24  1459     Patient presents with: Shortness of Breath   Eric Palmer is a 68 y.o. male.   68 yo M with a cc of sob.  Going on for the past week.  Actually feels a bit better today.  Cough.  Improves with inhaler therapy at home.  Thinks maybe had a fever yesterday but is better today.  He had a routine scheduled coronary CTA.  Incidental finding of pneumonia was noted.  He had called his PCP who told him he needed to come to the ED immediately to be seen.   Shortness of Breath      Prior to Admission medications   Medication Sig Start Date End Date Taking? Authorizing Provider  doxycycline  (VIBRAMYCIN ) 100 MG capsule Take 1 capsule (100 mg total) by mouth 2 (two) times daily for 7 days. 03/08/24 03/15/24 Yes Emil Share, DO  albuterol  (VENTOLIN  HFA) 108 (90 Base) MCG/ACT inhaler Inhale 2 puffs into the lungs every 4-6 hours as needed for cough/wheeze. 01/14/24   Burchette, Wolm ORN, MD  atorvastatin  (LIPITOR) 40 MG tablet Take 1 tablet (40 mg total) by mouth at bedtime. 12/13/23   Wyn Jackee VEAR Mickey., NP  azelastine  (ASTELIN ) 0.1 % nasal spray Place 2 sprays into both nostrils 2 (two) times daily. Use in each nostril as directed 12/15/23   Burchette, Wolm ORN, MD  diltiazem  (CARDIZEM  CD) 360 MG 24 hr capsule Take 1 capsule (360 mg total) by mouth daily. 12/13/23   Wyn Jackee VEAR Mickey., NP  empagliflozin  (JARDIANCE ) 10 MG TABS tablet Take 1 tablet (10 mg total) by mouth daily before breakfast. 12/13/23   Wyn Jackee VEAR Mickey., NP  fluticasone -salmeterol (ADVAIR  DISKUS) 100-50 MCG/ACT AEPB Inhale 1 puff into the lungs 2 (two) times daily. 10/04/23   Burchette, Wolm ORN, MD  glucose blood (ONETOUCH VERIO) test strip Test blood sugar once daily 07/21/22   Burchette, Wolm ORN, MD  icosapent  Ethyl (VASCEPA ) 1 g capsule Take 2 capsules (2 g total) by mouth 2 (two) times daily. 12/13/23    Wyn Jackee VEAR Mickey., NP  metFORMIN  (GLUCOPHAGE -XR) 750 MG 24 hr tablet Take 1 tablet (750 mg total) by mouth 2 (two) times daily. 12/15/23   Burchette, Wolm ORN, MD  nitroGLYCERIN  (NITROSTAT ) 0.4 MG SL tablet DISSOLVE 1 TABLET UNDER THE TONGUE EVERY 5 MINUTES FOR 3 DOSES AS NEEDED FOR CHEST PAIN(CALL 911 IF 3RD DOSE IS TAKEN) 12/13/23   Wyn Jackee VEAR Mickey., NP  OMEPRAZOLE  PO Take 5 mg by mouth daily at 6 (six) AM.    [provider]  ondansetron  (ZOFRAN ) 8 MG tablet Take 1 tablet (8 mg total) by mouth every 8 (eight) hours as needed for nausea or vomiting. 01/14/24   Burchette, Wolm ORN, MD  ONETOUCH DELICA LANCETS FINE MISC USE TO CHECK BLOOD SUGAR ONCE DAILY 03/19/17   Burchette, Wolm ORN, MD  rivaroxaban  (XARELTO ) 20 MG TABS tablet Take 1 tablet (20 mg total) by mouth daily with supper. 12/13/23   Wyn Jackee VEAR Mickey., NP  sotalol  (BETAPACE ) 80 MG tablet Take 1 tablet (80 mg total) by mouth 2 (two) times daily. 12/13/23   Wyn Jackee VEAR Mickey., NP  tadalafil  (CIALIS ) 20 MG tablet Take 1 tablet (20 mg total) by mouth daily as needed for erectile dysfunction. 12/13/23   Wyn Jackee VEAR Mickey., NP    Allergies: Cephalosporins, Penicillins, Thallium, Codeine,  Gabapentin , and Eliquis  [apixaban ]    Review of Systems  Respiratory:  Positive for shortness of breath.     Updated Vital Signs BP 116/79 (BP Location: Right Arm)   Pulse 81   Temp 97.8 F (36.6 C)   Resp 17   Ht 6' 1 (1.854 m)   Wt 104.1 kg   SpO2 93%   BMI 30.28 kg/m   Physical Exam Vitals and nursing note reviewed.  Constitutional:      Appearance: He is well-developed.  HENT:     Head: Normocephalic and atraumatic.  Eyes:     Pupils: Pupils are equal, round, and reactive to light.  Neck:     Vascular: No JVD.  Cardiovascular:     Rate and Rhythm: Normal rate and regular rhythm.     Heart sounds: No murmur heard.    No friction rub. No gallop.  Pulmonary:     Effort: No respiratory distress.     Breath sounds: No wheezing.   Abdominal:     General: There is no distension.     Tenderness: There is no abdominal tenderness. There is no guarding or rebound.  Musculoskeletal:        General: Normal range of motion.     Cervical back: Normal range of motion and neck supple.  Skin:    Coloration: Skin is not pale.     Findings: No rash.  Neurological:     Mental Status: He is alert and oriented to person, place, and time.  Psychiatric:        Behavior: Behavior normal.     (all labs ordered are listed, but only abnormal results are displayed) Labs Reviewed - No data to display  EKG: None  Radiology: NM PET CT CARDIAC PERFUSION MULTI W/ABSOLUTE BLOODFLOW Result Date: 03/08/2024   LV perfusion is normal.   Rest left ventricular function is normal. Rest EF: 55%. Stress left ventricular function is normal. Stress EF: 59%. End diastolic cavity size is normal. End systolic cavity size is normal.   Myocardial blood flow was computed to be 0.76ml/g/min at rest and 1.59ml/g/min at stress. Global myocardial blood flow reserve was 2.56 and was normal.   Coronary calcium  assessment not performed due to prior revascularization.   The study is normal. The study is low risk.   Electronically signed by Jerel Balding MD EXAM: OVER-READ INTERPRETATION CARDIAC CT CHEST The following report is an over-read performed by radiologist Dr. Ryan Salvage of [facility] on 03/08/2024. This over-read does not include interpretation of cardiac or coronary anatomy or pathology. The cardiac and PET interpretation by the cardiologist is attached or will be attached. COMPARISON:  09/10/2023 FINDINGS: Extracardiac Vascular: Descending aortic atheromatous vascular calcification. Mediastinum: Unremarkable Lung: Airway thickening is present, suggesting bronchitis or reactive airways disease. New hazy airspace opacity in the left lower lobe for example on image 39 series 4, suggesting alveolitis and potentially early pneumonia. Included  Upper Abdomen: Punctate calcification in the spleen compatible with old granulomatous disease. Musculoskeletal: Unremarkable IMPRESSION: 1. Airway thickening is present, suggesting bronchitis or reactive airways disease. 2. New hazy airspace opacity in the left lower lobe, suggesting alveolitis and potentially early pneumonia. 3. Old granulomatous disease in the spleen. 4.  Aortic Atherosclerosis (ICD10-I70.0). Electronically Signed   By: Ryan Salvage M.D.   On: 03/08/2024 10:03    Procedures   Medications Ordered in the ED - No data to display  Medical Decision Making Risk Prescription drug management.   68 yo M with a chief complaints of cough congestion shortness of breath going on for about a week now but actually much better today.  He said he had something similar about a month ago was diagnosed with bronchitis and finished a course of doxycycline .  He had a previously scheduled coronary CT done today.  There was concern for possible pneumonia and he was encouraged to come here for evaluation.  This certainly could be resolving based on his history.  It does sound like he clinically had pneumonia.  He has a allergy to both amoxicillin and cephalosporins.  Has tolerated Doxy previously.  Will give another course of doxycycline .  PCP follow-up.  4:04 PM:  I have discussed the diagnosis/risks/treatment options with the patient.  Evaluation and diagnostic testing in the emergency department does not suggest an emergent condition requiring admission or immediate intervention beyond what has been performed at this time.  They will follow up with PCP. We also discussed returning to the ED immediately if new or worsening sx occur. We discussed the sx which are most concerning (e.g., sudden worsening pain, fever, inability to tolerate by mouth) that necessitate immediate return. Medications administered to the patient during their visit and any new prescriptions  provided to the patient are listed below.  Medications given during this visit Medications - No data to display   The patient appears reasonably screen and/or stabilized for discharge and I doubt any other medical condition or other [facility] requiring further screening, evaluation, or treatment in the ED at this time prior to discharge.       Final diagnoses:  Community acquired pneumonia, unspecified laterality    ED Discharge Orders          Ordered    doxycycline  (VIBRAMYCIN ) 100 MG capsule  2 times daily        03/08/24 1558               Emil Share, DO 03/08/24 1604

## 2024-03-08 NOTE — Telephone Encounter (Signed)
 [facility] radiology calling to report urgent results.

## 2024-03-08 NOTE — Telephone Encounter (Signed)
 See phone note from today. Patient has been notified but declined to go to ED for evaluation.

## 2024-03-08 NOTE — Telephone Encounter (Addendum)
 FYI Only or Action Required?: FYI only for provider.  Patient was last seen in primary care on 01/24/2024 by Micheal Wolm ORN, MD.  Called Nurse Triage reporting Wheezing, Hoarse, and Cough.  Symptoms began several weeks ago.  Interventions attempted: Other: NM PET scan with cardiology today, they state patient needs to go to ED and then follow up with PCP.  Symptoms are: wheezing, hoarse voice, cough, sore throatgradually improving.  Triage Disposition: Call PCP Within 24 Hours  Patient/caregiver understands and will follow disposition?: Yes        Copied from CRM [id number]. Topic: Clinical - Red Word Triage >> Mar 08, 2024  2:14 PM Mercedes MATSU wrote: Red Word that prompted transfer to Nurse Triage: Patient states he has bronchitis and was told by another provider he needs to see his pcp which is Dr. Micheal and he does not have openings today. Reason for Disposition  [1] Follow-up call from patient regarding patient's clinical status AND [2] information NON-URGENT  Answer Assessment - Initial Assessment Questions 1. REASON FOR CALL or QUESTION: What is your reason for calling today? or How can I best     Patient had a NM PET scan with cardiology and was told he has possible pneumonia and was advised: 03/08/24 11:13 AM Result Note Addendum: Please advise patient to go to the ED for further evaluation of possible pneumonia prior to follow-up with PCP.  Jackee Alberts, NP   Patient is asking if he can come in today to see PCP. Advised that he should stick with cardiology recommendation to go to ED prior to follow up with PCP. Patient verbalizes understanding. He states he was seen on 01/24/24 for cough, hoarse voice, wheezing, sore throat with Dr Micheal and the symptoms have continued. Patient denies chest pain or SOB. Patient states he will go to [facility].  2. CALLER: Document the source of call. (e.g., laboratory staff, caregiver or patient).     Patient.  Protocols used:  PCP Call - No Triage-A-AH

## 2024-03-08 NOTE — Discharge Instructions (Signed)
 Follow up with your family doc in the office. Please return for worsening difficulty breathing, confusion

## 2024-03-08 NOTE — Telephone Encounter (Signed)
 Wyn Jackee VEAR Mickey., NP to Gladis Reena GAILS, RN      03/08/24 11:14 AM Please contact patient and advise to go to the ED for evaluation of possible pneumonia.  I have sent additional guidance regarding his stress test results and a separate note.  Please let me know if you have any further questions.   Jackee Wyn, NP

## 2024-03-08 NOTE — ED Notes (Signed)
Reviewed discharge instructions, medications, and home care with pt. Pt verbalized understanding and had no further questions. Pt exited ED without complications.

## 2024-03-08 NOTE — ED Triage Notes (Signed)
 Pt via pov from home with sob. He reports that he had a planned cardiac test at [facility] today and received a call and was told to come to the ED as they had found something like a pneumonia in his lung. Pt states he has had sob and chest tightness the last few weeks that went away after using an inhaler. Pt a&o x 4; nad noted.

## 2024-03-10 ENCOUNTER — Telehealth: Payer: Self-pay

## 2024-03-10 NOTE — Transitions of Care (Post Inpatient/ED Visit) (Signed)
 03/10/2024  Name: Eric Palmer MRN: [id number] DOB: [birth year]  Today's TOC FU Call Status: Today's TOC FU Call Status:: Successful TOC FU Call Completed TOC FU Call Complete Date: 03/10/24 Patient's Name and Date of Birth confirmed.  Transition Care Management Follow-up Telephone Call Date of Discharge: 03/08/24 Discharge Facility: [facility] ([facility]) Type of Discharge: Emergency Department Reason for ED Visit: Other: (pneumonia) How have you been since you were released from the hospital?: Same Any questions or concerns?: No  Items Reviewed: Did you receive and understand the discharge instructions provided?: Yes Medications obtained,verified, and reconciled?: Yes (Medications Reviewed) Any new allergies since your discharge?: No Dietary orders reviewed?: No Do you have support at home?: Yes People in Home [RPT]: spouse  Medications Reviewed Today: Medications Reviewed Today     Reviewed by Cherri Yera, CMA (Certified Medical Assistant) on 03/10/24 at 1045  Med List Status: <None>   Medication Order Taking? Sig Documenting Provider Last Dose Status Informant  albuterol  (VENTOLIN  HFA) 108 (90 Base) MCG/ACT inhaler [id number] Yes Inhale 2 puffs into the lungs every 4-6 hours as needed for cough/wheeze. Micheal Wolm ORN, MD  Active   atorvastatin  (LIPITOR) 40 MG tablet [id number] Yes Take 1 tablet (40 mg total) by mouth at bedtime. Wyn Jackee VEAR Mickey., NP  Active   azelastine  (ASTELIN ) 0.1 % nasal spray [id number] Yes Place 2 sprays into both nostrils 2 (two) times daily. Use in each nostril as directed Micheal Wolm ORN, MD  Active   diltiazem  (CARDIZEM  CD) 360 MG 24 hr capsule [id number] Yes Take 1 capsule (360 mg total) by mouth daily. Wyn Jackee VEAR Mickey., NP  Active   doxycycline  (VIBRAMYCIN ) 100 MG capsule [id number] Yes Take 1 capsule (100 mg total) by mouth 2 (two) times daily for 7 days. Emil Share, DO  Active   empagliflozin  (JARDIANCE ) 10 MG TABS tablet  [id number] Yes Take 1 tablet (10 mg total) by mouth daily before breakfast. Wyn Jackee VEAR Mickey., NP  Active   fluticasone -salmeterol (ADVAIR  DISKUS) 100-50 MCG/ACT AEPB [id number] Yes Inhale 1 puff into the lungs 2 (two) times daily. Micheal Wolm ORN, MD  Active   glucose blood [facility] VERIO) test strip [id number] Yes Test blood sugar once daily Micheal Wolm ORN, MD  Active   icosapent  Ethyl (VASCEPA ) 1 g capsule [id number] Yes Take 2 capsules (2 g total) by mouth 2 (two) times daily. Wyn Jackee VEAR Mickey., NP  Active   metFORMIN  (GLUCOPHAGE -XR) 750 MG 24 hr tablet [id number] Yes Take 1 tablet (750 mg total) by mouth 2 (two) times daily. Micheal Wolm ORN, MD  Active   nitroGLYCERIN  (NITROSTAT ) 0.4 MG SL tablet [id number] Yes DISSOLVE 1 TABLET UNDER THE TONGUE EVERY 5 MINUTES FOR 3 DOSES AS NEEDED FOR CHEST PAIN(CALL 911 IF 3RD DOSE IS TAKEN) Wyn Jackee VEAR Mickey., NP  Active   OMEPRAZOLE  PO [id number] Yes Take 5 mg by mouth daily at 6 (six) AM. [provider]  Active   ondansetron  (ZOFRAN ) 8 MG tablet [id number] Yes Take 1 tablet (8 mg total) by mouth every 8 (eight) hours as needed for nausea or vomiting. Micheal Wolm ORN, MD  Active   [facility] LANCETS FINE MISC [id number] Yes USE TO CHECK BLOOD SUGAR ONCE DAILY Burchette, Wolm ORN, MD  Active Self  rivaroxaban  (XARELTO ) 20 MG TABS tablet [id number] Yes Take 1 tablet (20 mg total) by mouth daily with supper. Wyn Jackee VEAR Mickey., NP  Active   sotalol  (BETAPACE ) 80 MG tablet [id number] Yes Take 1  tablet (80 mg total) by mouth 2 (two) times daily. Wyn Jackee VEAR Mickey., NP  Active   tadalafil  (CIALIS ) 20 MG tablet [id number] Yes Take 1 tablet (20 mg total) by mouth daily as needed for erectile dysfunction. Wyn Jackee VEAR Mickey., NP  Active             Home Care and Equipment/Supplies: Were Home Health Services Ordered?: No Any new equipment or medical supplies ordered?: No  Functional Questionnaire: Do you need assistance with bathing/showering  or dressing?: No Do you need assistance with meal preparation?: No Do you need assistance with eating?: No Do you have difficulty maintaining continence: No Do you need assistance with getting out of bed/getting out of a chair/moving?: No Do you have difficulty managing or taking your medications?: No  Follow up appointments reviewed: PCP Follow-up appointment confirmed?: Yes Date of PCP follow-up appointment?: 03/15/24 Follow-up Provider: Dr. Micheal Specialist [facility] Follow-up appointment confirmed?: Yes Date of Specialist follow-up appointment?: 03/13/24 Follow-Up Specialty Provider:: Cardiologist. Do you need transportation to your follow-up appointment?: No    SIGNATURE: Liliani Bobo, CMA

## 2024-03-12 ENCOUNTER — Other Ambulatory Visit: Payer: Self-pay | Admitting: Family Medicine

## 2024-03-12 ENCOUNTER — Other Ambulatory Visit: Payer: Self-pay | Admitting: Nurse Practitioner

## 2024-03-13 ENCOUNTER — Other Ambulatory Visit (HOSPITAL_BASED_OUTPATIENT_CLINIC_OR_DEPARTMENT_OTHER): Payer: Self-pay

## 2024-03-13 ENCOUNTER — Ambulatory Visit: Admitting: Nurse Practitioner

## 2024-03-13 ENCOUNTER — Other Ambulatory Visit: Payer: Self-pay

## 2024-03-13 MED ORDER — ONDANSETRON HCL 8 MG PO TABS
8.0000 mg | ORAL_TABLET | Freq: Three times a day (TID) | ORAL | 0 refills | Status: DC | PRN
  Filled 2024-03-13: qty 15, 5d supply, fill #0

## 2024-03-14 ENCOUNTER — Other Ambulatory Visit: Payer: Self-pay

## 2024-03-14 ENCOUNTER — Other Ambulatory Visit (HOSPITAL_BASED_OUTPATIENT_CLINIC_OR_DEPARTMENT_OTHER): Payer: Self-pay

## 2024-03-14 MED ORDER — EMPAGLIFLOZIN 10 MG PO TABS
10.0000 mg | ORAL_TABLET | Freq: Every day | ORAL | 2 refills | Status: AC
  Filled 2024-03-14: qty 90, 90d supply, fill #0
  Filled 2024-06-10: qty 90, 90d supply, fill #1
  Filled 2024-09-13: qty 90, 90d supply, fill #2

## 2024-03-15 ENCOUNTER — Ambulatory Visit (INDEPENDENT_AMBULATORY_CARE_PROVIDER_SITE_OTHER): Admitting: Family Medicine

## 2024-03-15 ENCOUNTER — Encounter: Payer: Self-pay | Admitting: Family Medicine

## 2024-03-15 VITALS — BP 120/60 | HR 90 | Temp 98.0°F | Wt 225.8 lb

## 2024-03-15 DIAGNOSIS — J42 Unspecified chronic bronchitis: Secondary | ICD-10-CM

## 2024-03-15 DIAGNOSIS — R053 Chronic cough: Secondary | ICD-10-CM | POA: Diagnosis not present

## 2024-03-15 DIAGNOSIS — J410 Simple chronic bronchitis: Secondary | ICD-10-CM

## 2024-03-15 NOTE — Progress Notes (Unsigned)
 Established Patient Office Visit  Subjective   Patient ID: Eric Palmer, male    DOB: [birth year]  Age: 68 y.o. MRN: [id number]  Chief Complaint  Patient presents with   Hospitalization Follow-up    HPI  {History (Optional):23778} Eric Palmer is seen for ER follow-up.  He has history of atrial fibrillation, CAD, hypertension, asthma, COPD, obstructive sleep apnea, type 2 diabetes, hyperlipidemia.  Had recent nuclear medicine PET CT cardiac perfusion study per cardiology.  This was basically normal/low risk study but evidence of new hazy airspace opacity left lower lobe suggesting alveolitis and potentially early pneumonia.  He was advised at that point to go to the ER for further evaluation.  Denied any fever.  He said some chronic cough for months.  Was seen in the ER and prescribed doxycycline  which she completed.  He been seen here June 2 with upper respiratory symptoms and cough.  Was given Depo-Medrol  and started back on Astelin  and had been treated recently with doxycycline .  He states has had some persistent cough really now for several months.  No hemoptysis.  No AC use.  Some hoarseness.  No obvious reflux symptoms.  Quit smoking about 6 years ago with about 60-pack-year history.  Takes Advair  regularly.  Has noted increased dyspnea with activity particularly in the heat and humidity recently.  This has been very much limiting of outdoor activities.  Had low-dose CT lung cancer screening back in January which showed no concerning lesions.  Past Medical History:  Diagnosis Date   Allergy    Anal fissure    Chronic bronchitis ([name])    get it q spring and fall (10/27/2013)   Coronary artery disease    a. s/p PCI in 1996. b. LHC 01/2006 showed 50% mid LAD, otherwise widely patent cors with minimal irregularities. c. Nuclear stress test in 04/2009 was reportedly low risk, no ischemia. // d. Nuclear stress test 2/19: EF 55, transient ST depression in recovery, no ischemia, inf defect  (artifact vs scar)    DM type 2, uncontrolled, with neuropathy    ED (erectile dysfunction)    Essential hypertension    Hx of echocardiogram    Echo (10/2013): Mild LVH, moderate focal basal hypertrophy of the septum, EF 50-55%, no RWMA   Hyperlipidemia    Internal hemorrhoids without mention of complication    Obesity    Obesity    OSA on CPAP    Paresthesia of both legs    Paroxysmal atrial fibrillation ([name])    a. failed Tikosyn , Multaq . b. s/p ablation of AF/AFL in 12/2013.   Paroxysmal atrial flutter ([name])    a. s/p ablation of AF/AFL in 12/2013.   Sleep apnea    Smoker    QUIT 10/13/13   Tubular adenoma of colon 2016   Past Surgical History:  Procedure Laterality Date   ABLATION  01-12-2014   PVI and CTI by Dr Kelsie   ANAL FISSURE REPAIR  ~ 2010   ATRIAL FIBRILLATION ABLATION N/A 01/12/2014   Procedure: ATRIAL FIBRILLATION ABLATION;  Surgeon: Lynwood JONETTA Kelsie, MD;  Location: [facility] CATH LAB;  Service: Cardiovascular;  Laterality: N/A;   CARDIAC CATHETERIZATION  2007   CORONARY ANGIOPLASTY WITH STENT PLACEMENT  1996   residual 50% LAD and RCA by cath in 2007   CORONARY ANGIOPLASTY WITH STENT PLACEMENT  1995   INGUINAL HERNIA REPAIR Right 1963   LEFT HEART CATH AND CORONARY ANGIOGRAPHY N/A 10/11/2017   Procedure: LEFT HEART CATH AND CORONARY ANGIOGRAPHY;  Surgeon: Claudene Victory ORN, MD;  Location: [facility] INVASIVE CV LAB;  Service: Cardiovascular;  Laterality: N/A;   TEE WITHOUT CARDIOVERSION N/A 01/11/2014   Procedure: TRANSESOPHAGEAL ECHOCARDIOGRAM (TEE);  Surgeon: Aleene JINNY Passe, MD;  Location: [facility] ENDOSCOPY;  Service: Cardiovascular;  Laterality: N/A;   TONSILLECTOMY AND ADENOIDECTOMY  1979    reports that he quit smoking about 5 years ago. His smoking use included cigarettes. He started smoking about 48 years ago. He has a 43 pack-year smoking history. He has never used smokeless tobacco. He reports current alcohol use. He reports that he does not use drugs. family history includes  Diabetes in his mother; Heart attack in his father; Heart disease in his brother and another family member; Heart disease (age of onset: 55) in his father; Heart disease (age of onset: 62) in his mother; Hyperlipidemia in an other family member; Hypertension in his mother. Allergies  Allergen Reactions   Cephalosporins Anaphylaxis   Penicillins Anaphylaxis and Other (See Comments)    Has patient had a PCN reaction causing immediate rash, facial/tongue/throat swelling, SOB or lightheadedness with hypotension: Yes Has patient had a PCN reaction causing severe rash involving mucus membranes or skin necrosis: No Has patient had a PCN reaction that required hospitalization: No - went to doctors office Has patient had a PCN reaction occurring within the last 10 years: No If all of the above answers are NO, then may proceed with Cephalosporin use.    Thallium Itching and Rash   Codeine Itching   Gabapentin  Other (See Comments)    made me feel drugged   Eliquis  [Apixaban ] Diarrhea    Review of Systems  Constitutional:  Negative for chills, fever, malaise/fatigue and weight loss.  Eyes:  Negative for blurred vision.  Respiratory:  Positive for cough and shortness of breath. Negative for hemoptysis.   Cardiovascular:  Negative for chest pain.  Neurological:  Negative for dizziness, weakness and headaches.      Objective:     BP 120/60 (BP Location: Left Arm, Patient Position: Sitting, Cuff Size: Normal)   Pulse 90   Temp 98 F (36.7 C) (Oral)   Wt 225 lb 12.8 oz (102.4 kg)   SpO2 94%   BMI 29.79 kg/m  BP Readings from Last 3 Encounters:  03/15/24 120/60  03/08/24 116/79  03/08/24 (!) 99/51   Wt Readings from Last 3 Encounters:  03/15/24 225 lb 12.8 oz (102.4 kg)  03/08/24 229 lb 8 oz (104.1 kg)  01/24/24 229 lb 9.6 oz (104.1 kg)      Physical Exam Vitals reviewed.  Constitutional:      General: He is not in acute distress.    Appearance: He is not ill-appearing.   Cardiovascular:     Rate and Rhythm: Normal rate and regular rhythm.  Pulmonary:     Effort: Pulmonary effort is normal.     Breath sounds: Normal breath sounds. No wheezing or rales.  Musculoskeletal:     Right lower leg: No edema.     Left lower leg: No edema.      No results found for any visits on 03/15/24.  Last CBC Lab Results  Component Value Date   WBC 8.7 06/07/2023   HGB 16.6 06/07/2023   HCT 49.9 06/07/2023   MCV 91.6 06/07/2023   MCH 30.6 07/01/2020   RDW 13.3 06/07/2023   PLT 285.0 06/07/2023   Last metabolic panel Lab Results  Component Value Date   GLUCOSE 124 (H) 06/07/2023   NA 138 06/07/2023  K 4.5 06/07/2023   CL 102 06/07/2023   CO2 28 06/07/2023   BUN 16 06/07/2023   CREATININE 1.08 06/07/2023   GFR 71.56 06/07/2023   CALCIUM  9.6 06/07/2023   PROT 7.3 06/07/2023   ALBUMIN 4.4 06/07/2023   BILITOT 0.8 06/07/2023   ALKPHOS 51 06/07/2023   AST 13 06/07/2023   ALT 24 09/23/2023   ANIONGAP 7 08/15/2017   Last hemoglobin A1c Lab Results  Component Value Date   HGBA1C 7.5 (A) 11/02/2023   Last thyroid  functions Lab Results  Component Value Date   TSH 1.40 12/31/2021      The ASCVD Risk score (Arnett DK, et al., 2019) failed to calculate for the following reasons:   The valid total cholesterol range is 130 to 320 mg/dL    Assessment & Plan:   #1 recent PET/CT cardiac study showing possible alveolitis versus early pneumonia left lower lobe.  Patient treated with doxycycline .  Denied any fever at that time and felt relatively well and at baseline at the time of that study.  Not clear if this was clinical pneumonia.  #2 chronic cough now for several months.  No hemoptysis.  Low-dose CT lung cancer screen back in January unremarkable.  Discussed differential for chronic cough including silent GERD, postnasal drip, asthma related.  Already on Advair  regularly.  No obvious wheezing on exam today.  We suggested the following:  -Consider  elevate head of bed 4 to 6 inches - Continue Advair  regularly - Increase omeprazole  to 20 mg daily.  Currently only taking 5 mg daily - Set up pulmonary referral given duration of cough  Wolm Scarlet, MD

## 2024-03-15 NOTE — Patient Instructions (Signed)
 I will set up pulmonary referral  Consider elevate head of bed 4-6 inches  Increase the Omeprazole  to 20 mg daily.

## 2024-03-16 ENCOUNTER — Other Ambulatory Visit (HOSPITAL_BASED_OUTPATIENT_CLINIC_OR_DEPARTMENT_OTHER): Payer: Self-pay

## 2024-03-21 NOTE — Telephone Encounter (Signed)
 SABRA

## 2024-03-27 ENCOUNTER — Other Ambulatory Visit (HOSPITAL_BASED_OUTPATIENT_CLINIC_OR_DEPARTMENT_OTHER): Payer: Self-pay

## 2024-04-10 NOTE — Progress Notes (Unsigned)
 Cardiology Office Note    Patient Name: Eric Palmer Date of Encounter: 04/10/2024  Primary Care Provider:  Micheal Wolm ORN, MD Primary Cardiologist:  Lonni Cash, MD Primary Electrophysiologist: None   Past Medical History    Past Medical History:  Diagnosis Date   Allergy    Anal fissure    Chronic bronchitis ([name])    get it q spring and fall (10/27/2013)   Coronary artery disease    a. s/p PCI in 1996. b. LHC 01/2006 showed 50% mid LAD, otherwise widely patent cors with minimal irregularities. c. Nuclear stress test in 04/2009 was reportedly low risk, no ischemia. // d. Nuclear stress test 2/19: EF 55, transient ST depression in recovery, no ischemia, inf defect (artifact vs scar)    DM type 2, uncontrolled, with neuropathy    ED (erectile dysfunction)    Essential hypertension    Hx of echocardiogram    Echo (10/2013): Mild LVH, moderate focal basal hypertrophy of the septum, EF 50-55%, no RWMA   Hyperlipidemia    Internal hemorrhoids without mention of complication    Obesity    Obesity    OSA on CPAP    Paresthesia of both legs    Paroxysmal atrial fibrillation ([name])    a. failed Tikosyn , Multaq . b. s/p ablation of AF/AFL in 12/2013.   Paroxysmal atrial flutter ([name])    a. s/p ablation of AF/AFL in 12/2013.   Sleep apnea    Smoker    QUIT 10/13/13   Tubular adenoma of colon 2016    History of Present Illness  Eric Palmer is a 68 y.o. male with a PMH of CAD s/p PCI of RCA in 1996, paroxysmal AF s/p AF ablation 12/2013, OSA (on CPAP), former tobacco abuse, obesity, HLD, DM type II, HTN, who presents today for 47-month follow-up.  Eric Palmer was last seen on 12/13/2023 for annual follow-up.  During his visit he reported chest tightness that was not associated with any physical exertion such as walking.  He was noted to have stable BP and PET stress test was ordered for further evaluation.  PET stress results show normal EF with no evidence of ischemia or  infarction but did note left lower lobe pneumonia.  He was seen in the ED on 03/08/2024 evaluation of pneumonia and patient reported feeling better and was continued on additional dose of doxycycline .  He was seen by his PCP on 03/15/2024 and noted some persisting cough hoarseness omeprazole  increased to 20 mg and continued on Advair .  He was also referred to pulmonology for evaluation.  Eric Palmer presents today for a 60-month follow-up.  He reports since his previous visit experiencing random chest tightness described as a sensation of pressure sometimes associated with A-fib and accompanied by breathing difficulties. He uses an albuterol  inhaler daily, which provides slight relief. He has a history of coronary artery disease, with a stent placed 30 years ago, and has undergone previous catheterizations, including one where a 95% blockage was treated. He is concerned about his symptoms due to a family history of heart disease, including his father who died of a heart attack at 50 after multiple heart attacks. He manages atrial fibrillation without significant issues, although stress can exacerbate symptoms. He experiences sensations in his arm and head during episodes. He has not used nitroglycerin  recently but has it available. He reports a breathing problem that feels asthmatic, with a sensation of pressure in his chest. He has a 40-year history of smoking and  has been off a CPAP machine after losing weight. He takes medication for acid reflux. His oxygen saturation has been consistently around 94% since June.  He has a history of pneumonia, incidentally found during a cardiac test, and is scheduled to see a pulmonologist for further evaluation of respiratory symptoms. He experiences neuropathy in his legs, with burning, itching, and tingling sensations, particularly at night. He manages the pain with Tylenol  and has undergone nerve conduction studies and evaluation by a foot doctor. He plays pickleball  regularly, which involves significant foot movement. Patient denies chest pain, palpitations, dyspnea, PND, orthopnea, nausea, vomiting, dizziness, syncope, edema, weight gain, or early satiety. Discussed the use of AI scribe software for clinical note transcription with the patient, who gave verbal consent to proceed.  History of Present Illness   Review of Systems  Please see the history of present illness.    All other systems reviewed and are otherwise negative except as noted above.  Physical Exam    Wt Readings from Last 3 Encounters:  03/15/24 225 lb 12.8 oz (102.4 kg)  03/08/24 229 lb 8 oz (104.1 kg)  01/24/24 229 lb 9.6 oz (104.1 kg)   CD:Uyzmz were no vitals filed for this visit.,There is no height or weight on file to calculate BMI. GEN: Well nourished, well developed in no acute distress Neck: No JVD; No carotid bruits Pulmonary: Clear to auscultation without rales, diminished in bases Cardiovascular: Normal rate. Regular rhythm. Normal S1. Normal S2.   Murmurs: There is no murmur.  ABDOMEN: Soft, non-tender, non-distended EXTREMITIES:  No edema; No deformity   EKG/LABS/ Recent Cardiac Studies   ECG personally reviewed by me today -none completed today  Risk Assessment/Calculations:    CHA2DS2-VASc Score = 4   This indicates a 4.8% annual risk of stroke. The patient's score is based upon: CHF History: 0 HTN History: 1 Diabetes History: 1 Stroke History: 0 Vascular Disease History: 1 Age Score: 1 Gender Score: 0     STOP-Bang Score:         Lab Results  Component Value Date   WBC 8.7 06/07/2023   HGB 16.6 06/07/2023   HCT 49.9 06/07/2023   MCV 91.6 06/07/2023   PLT 285.0 06/07/2023   Lab Results  Component Value Date   CREATININE 1.08 06/07/2023   BUN 16 06/07/2023   NA 138 06/07/2023   K 4.5 06/07/2023   CL 102 06/07/2023   CO2 28 06/07/2023   Lab Results  Component Value Date   CHOL 111 09/23/2023   HDL 36 (L) 09/23/2023   LDLCALC 48  09/23/2023   LDLDIRECT 76.0 03/24/2023   TRIG 159 (H) 09/23/2023   CHOLHDL 3.1 09/23/2023    Lab Results  Component Value Date   HGBA1C 7.5 (A) 11/02/2023   Assessment & Plan    Assessment & Plan   1.Coronary artery disease: - Patient underwent CT calcium  score with elevated score of 2232 with subsequent PET stress test completed that was normal. -No ischemia on PET stress test. Coronary arteries show adequate blood flow. Previous catheterization showed 50-70% narrowing. Symptoms may relate to atrial fibrillation or pulmonary issues. - Instruct to use nitroglycerin  during episodes of chest tightness to assess if symptoms are heart-related. - Advise to seek emergency care if symptoms change or worsen.  2.Paroxysmal AF: - Patient is currently rate controlled at 70 bpm -Atrial fibrillation may contribute to chest tightness due to decreased perfusion. Symptoms exacerbated by stress. Current rhythm normal auscultation. - Continue Xarelto  20  mg daily - Advise on caffeine moderation to reduce AFib triggers. - Consider use of smartwatch to monitor for irregular heartbeats.  3.Essential hypertension: - Patient's blood pressure today was 110/64 -Continue Cardizem  360 mg daily and sotalol  80 mg twice daily  4.  Hyperlipidemia: - Patient's last LDL cholesterol was 76 and recent lipoprotein a was normal - Continue Lipitor 40 mg and Vascepa  2 g twice daily  5.  DM type II: - Patient doing well with decreased nocturia with lower dose of Jardiance  at 10 mg - Continue current treatment plan per PCP.  Disposition: Follow-up with Lonni Cash, MD or APP in 2-3 months    Signed, Wyn Raddle, Jackee Shove, NP 04/10/2024, 1:19 PM [facility]

## 2024-04-11 ENCOUNTER — Other Ambulatory Visit: Payer: Self-pay | Admitting: Family Medicine

## 2024-04-11 ENCOUNTER — Other Ambulatory Visit (HOSPITAL_BASED_OUTPATIENT_CLINIC_OR_DEPARTMENT_OTHER): Payer: Self-pay

## 2024-04-11 ENCOUNTER — Ambulatory Visit: Attending: Nurse Practitioner | Admitting: Nurse Practitioner

## 2024-04-11 ENCOUNTER — Encounter: Payer: Self-pay | Admitting: Nurse Practitioner

## 2024-04-11 ENCOUNTER — Other Ambulatory Visit: Payer: Self-pay

## 2024-04-11 VITALS — BP 110/60 | HR 70 | Ht 74.0 in | Wt 228.0 lb

## 2024-04-11 DIAGNOSIS — I2510 Atherosclerotic heart disease of native coronary artery without angina pectoris: Secondary | ICD-10-CM

## 2024-04-11 DIAGNOSIS — I10 Essential (primary) hypertension: Secondary | ICD-10-CM | POA: Diagnosis not present

## 2024-04-11 DIAGNOSIS — E7849 Other hyperlipidemia: Secondary | ICD-10-CM

## 2024-04-11 DIAGNOSIS — I480 Paroxysmal atrial fibrillation: Secondary | ICD-10-CM | POA: Diagnosis not present

## 2024-04-11 DIAGNOSIS — E1142 Type 2 diabetes mellitus with diabetic polyneuropathy: Secondary | ICD-10-CM | POA: Diagnosis not present

## 2024-04-11 MED ORDER — ALBUTEROL SULFATE HFA 108 (90 BASE) MCG/ACT IN AERS
2.0000 | INHALATION_SPRAY | RESPIRATORY_TRACT | 1 refills | Status: DC | PRN
  Filled 2024-04-11: qty 6.7, 17d supply, fill #0
  Filled 2024-05-08: qty 6.7, 17d supply, fill #1

## 2024-04-11 NOTE — Patient Instructions (Signed)
 Medication Instructions:  Your physician recommends that you continue on your current medications as directed. Please refer to the Current Medication list given to you today. *If you need a refill on your cardiac medications before your next appointment, please call your pharmacy*  Lab Work: None ordered  If you have labs (blood work) drawn today and your tests are completely normal, you will receive your results only by: MyChart Message (if you have MyChart) OR A paper copy in the mail If you have any lab test that is abnormal or we need to change your treatment, we will call you to review the results.  Testing/Procedures: None ordered  Follow-Up: At [facility], you and your health needs are our priority.  As part of our continuing mission to provide you with exceptional heart care, our providers are all part of one team.  This team includes your primary Cardiologist (physician) and Advanced Practice Providers or APPs (Physician Assistants and Nurse Practitioners) who all work together to provide you with the care you need, when you need it.  Your next appointment:   2 month(s)  Provider:   Lonni Cash, MD    We recommend signing up for the patient portal called MyChart.  Sign up information is provided on this After Visit Summary.  MyChart is used to connect with patients for Virtual Visits (Telemedicine).  Patients are able to view lab/test results, encounter notes, upcoming appointments, etc.  Non-urgent messages can be sent to your provider as well.   To learn more about what you can do with MyChart, go to [email or web address].   Other Instructions

## 2024-05-09 ENCOUNTER — Ambulatory Visit: Admitting: Pulmonary Disease

## 2024-05-09 ENCOUNTER — Encounter: Payer: Self-pay | Admitting: Pulmonary Disease

## 2024-05-09 VITALS — BP 114/84 | HR 75 | Temp 98.6°F | Ht 73.0 in | Wt 227.0 lb

## 2024-05-09 DIAGNOSIS — R0609 Other forms of dyspnea: Secondary | ICD-10-CM | POA: Diagnosis not present

## 2024-05-09 DIAGNOSIS — J4540 Moderate persistent asthma, uncomplicated: Secondary | ICD-10-CM

## 2024-05-09 NOTE — Progress Notes (Signed)
 @Patient  ID: Eric Palmer, male    DOB: [birth year], 68 y.o.   MRN: [id number]  Chief Complaint  Patient presents with   Consult   Shortness of Breath    Walking and ata rest. Started a year ago    Referring provider: Micheal Wolm ORN, MD  HPI:   68 y.o. male with history of childhood asthma and history of cigarette smoking now in remission whom are seen for evaluation of dyspnea on exertion.  He was recent PCP note reviewed.  Multiple notes from cardiology reviewed.  Notes symptom started about a year ago.  Shortness of breath and chest tightness.  Likely spasm in his air tubes.  Tightness.  Difficulty breathing.  Associated with cough productive with phlegm.  Symptoms come and go.  Not reliably reproducible.  No clear pattern to symptoms or trigger for symptoms.  Often occurs when outdoors.  He reports history of significant lifelong seasonal allergies.  He has a history of asthma and uses Advair .  Mainly at night.  Not regular during the day.  Occasionally when he is more symptomatic.  He is albuterol  and this does improve his symptoms.  He uses this sparingly as it will often flare or cause a run of atrial fibrillation.  He follows closely with atrial fibrillation clinic.  He has been in sinus rhythm as of late.  Typically with his atrial fibrillation he feels butterflies in his chest.  He has not felt the sensation with recent symptoms.  He had a stent placed at age 44.  Significant CAD.  He had a recent NM perfusion scan that shows no significant blockages, normal EF per my review.  He notes OSA on CPAP in the past.  Managed by cardiology.  Repeat sleep test after significant weight loss yielded no sleep apnea per his report.  He was taken off CPAP.  He does feel like his breathing issues started or worsened after stopping CPAP.  At least the timeline fits with his report.  Reviewed serial images including yearly CT scan for lung cancer screening which demonstrates mild  emphysematous changes and bronchial wall thickening concerning for chronic bronchitis versus asthma.  His most recent scan 08/2023 noted a left lower lobe nodule, this was resolved or shrinking on CT coronary scan 09/2023.  Recent NM perfusion scan demonstrated left lower lobe infiltrate.  He denies any symptoms no cough fever chills.  No pneumonia symptoms.  He went to the ED and was prescribed clindamycin .  Again he had no symptoms of pneumonia.  He does endorse a history of GERD.  He takes PPI regularly.  No significant acid symptoms.  Questionaires / Pulmonary Flowsheets:   ACT:      No data to display          MMRC:     No data to display          Epworth:      No data to display          Tests:   FENO:  No results found for: NITRICOXIDE  PFT:     No data to display          WALK:      No data to display          Imaging: Personally reviewed and as per EMR and discussion this note No results found.  Lab Results: Personally reviewed CBC    Component Value Date/Time   WBC 8.7 06/07/2023 1103   RBC 5.44 06/07/2023  1103   HGB 16.6 06/07/2023 1103   HGB 16.1 06/29/2019 0724   HCT 49.9 06/07/2023 1103   HCT 46.4 06/29/2019 0724   PLT 285.0 06/07/2023 1103   PLT 237 06/29/2019 0724   MCV 91.6 06/07/2023 1103   MCV 90 06/29/2019 0724   MCH 30.6 07/01/2020 1411   MCHC 33.4 06/07/2023 1103   RDW 13.3 06/07/2023 1103   RDW 12.7 06/29/2019 0724   LYMPHSABS 2.6 06/07/2023 1103   MONOABS 0.7 06/07/2023 1103   EOSABS 0.4 06/07/2023 1103   BASOSABS 0.1 06/07/2023 1103    BMET    Component Value Date/Time   NA 138 06/07/2023 1103   NA 140 06/29/2019 0724   K 4.5 06/07/2023 1103   CL 102 06/07/2023 1103   CO2 28 06/07/2023 1103   GLUCOSE 124 (H) 06/07/2023 1103   BUN 16 06/07/2023 1103   BUN 12 06/29/2019 0724   CREATININE 1.08 06/07/2023 1103   CREATININE 1.23 07/01/2020 1411   CALCIUM  9.6 06/07/2023 1103   GFRNONAA 82 06/29/2019 0724    GFRAA 95 06/29/2019 0724    BNP No results found for: BNP  ProBNP    Component Value Date/Time   PROBNP 6.6 10/05/2013 2327    Specialty Problems       Pulmonary Problems   Allergic rhinitis   Qualifier: Diagnosis of  By: Micheal MD, Bruce   IMO SNOMED Dx Update Oct 2024      Asthma   Qualifier: Diagnosis of  By: Micheal MD, Bruce        Obstructive sleep apnea   Qualifier: Diagnosis of  By: Micheal MD, Bruce        COPD (chronic obstructive pulmonary disease) ([name])    Allergies  Allergen Reactions   Cephalosporins Anaphylaxis   Penicillins Anaphylaxis and Other (See Comments)    Has patient had a PCN reaction causing immediate rash, facial/tongue/throat swelling, SOB or lightheadedness with hypotension: Yes Has patient had a PCN reaction causing severe rash involving mucus membranes or skin necrosis: No Has patient had a PCN reaction that required hospitalization: No - went to doctors office Has patient had a PCN reaction occurring within the last 10 years: No If all of the above answers are NO, then may proceed with Cephalosporin use.    Thallium Itching and Rash   Codeine Itching   Gabapentin  Other (See Comments)    made me feel drugged   Eliquis  [Apixaban ] Diarrhea    Immunization History  Administered Date(s) Administered   Fluad Quad(high Dose 65+) 07/07/2022   Influenza Split 07/27/2011, 06/03/2012   Influenza,inj,Quad PF,6+ Mos 07/01/2020, 06/04/2021   Influenza-Unspecified 04/24/2013   Janssen (J&J) SARS-COV-2 Vaccination 02/22/2020   PNEUMOCOCCAL CONJUGATE-20 07/07/2022   Pneumococcal Conjugate-13 01/01/2011   Pneumococcal Polysaccharide-23 01/01/2011   Tdap 02/01/2012   Zoster, Live 11/08/2015    Past Medical History:  Diagnosis Date   Allergy    Anal fissure    Chronic bronchitis ([name])    get it q spring and fall (10/27/2013)   Coronary artery disease    a. s/p PCI in 1996. b. LHC 01/2006 showed 50% mid LAD,  otherwise widely patent cors with minimal irregularities. c. Nuclear stress test in 04/2009 was reportedly low risk, no ischemia. // d. Nuclear stress test 2/19: EF 55, transient ST depression in recovery, no ischemia, inf defect (artifact vs scar)    DM type 2, uncontrolled, with neuropathy    ED (erectile dysfunction)    Essential hypertension  Hx of echocardiogram    Echo (10/2013): Mild LVH, moderate focal basal hypertrophy of the septum, EF 50-55%, no RWMA   Hyperlipidemia    Internal hemorrhoids without mention of complication    Obesity    Obesity    OSA on CPAP    Paresthesia of both legs    Paroxysmal atrial fibrillation ([name])    a. failed Tikosyn , Multaq . b. s/p ablation of AF/AFL in 12/2013.   Paroxysmal atrial flutter ([name])    a. s/p ablation of AF/AFL in 12/2013.   Sleep apnea    Smoker    QUIT 10/13/13   Tubular adenoma of colon 2016    Tobacco History: Social History   Tobacco Use  Smoking Status Former   Current packs/day: 0.00   Average packs/day: 1 pack/day for 43.0 years (43.0 ttl pk-yrs)   Types: Cigarettes   Start date: 69   Quit date: 2020   Years since quitting: 5.7  Smokeless Tobacco Never   Counseling given: Not Answered   Continue to not smoke  Outpatient Encounter Medications as of 05/09/2024  Medication Sig   albuterol  (VENTOLIN  HFA) 108 (90 Base) MCG/ACT inhaler Inhale 2 puffs into the lungs every 4-6 hours as needed for cough/wheeze.   atorvastatin  (LIPITOR) 40 MG tablet Take 1 tablet (40 mg total) by mouth at bedtime.   azelastine  (ASTELIN ) 0.1 % nasal spray Place 2 sprays into both nostrils 2 (two) times daily. Use in each nostril as directed   diltiazem  (CARDIZEM  CD) 360 MG 24 hr capsule Take 1 capsule (360 mg total) by mouth daily.   empagliflozin  (JARDIANCE ) 10 MG TABS tablet Take 1 tablet (10 mg total) by mouth daily before breakfast.   fluticasone -salmeterol (ADVAIR  DISKUS) 100-50 MCG/ACT AEPB Inhale 1 puff into the lungs 2 (two) times  daily.   glucose blood (ONETOUCH VERIO) test strip Test blood sugar once daily   icosapent  Ethyl (VASCEPA ) 1 g capsule Take 2 capsules (2 g total) by mouth 2 (two) times daily.   metFORMIN  (GLUCOPHAGE -XR) 750 MG 24 hr tablet Take 1 tablet (750 mg total) by mouth 2 (two) times daily.   nitroGLYCERIN  (NITROSTAT ) 0.4 MG SL tablet DISSOLVE 1 TABLET UNDER THE TONGUE EVERY 5 MINUTES FOR 3 DOSES AS NEEDED FOR CHEST PAIN(CALL 911 IF 3RD DOSE IS TAKEN)   OMEPRAZOLE  PO Take 5 mg by mouth daily at 6 (six) AM.   ondansetron  (ZOFRAN ) 8 MG tablet Take 1 tablet (8 mg total) by mouth every 8 (eight) hours as needed for nausea or vomiting.   ONETOUCH DELICA LANCETS FINE MISC USE TO CHECK BLOOD SUGAR ONCE DAILY   rivaroxaban  (XARELTO ) 20 MG TABS tablet Take 1 tablet (20 mg total) by mouth daily with supper.   sotalol  (BETAPACE ) 80 MG tablet Take 1 tablet (80 mg total) by mouth 2 (two) times daily.   tadalafil  (CIALIS ) 20 MG tablet Take 1 tablet (20 mg total) by mouth daily as needed for erectile dysfunction.   No facility-administered encounter medications on file as of 05/09/2024.     Review of Systems  Review of Systems  No chest pain with exertion.  No orthopnea or PND.  Comprehensive review of systems otherwise negative. Physical Exam  BP 114/84 (BP Location: Left Arm, Patient Position: Sitting, Cuff Size: Large)   Pulse 75   Temp 98.6 F (37 C) (Oral)   Ht 6' 1 (1.854 m)   Wt 227 lb (103 kg)   SpO2 94%   BMI 29.95 kg/m   Wt  Readings from Last 5 Encounters:  05/09/24 227 lb (103 kg)  04/11/24 228 lb (103.4 kg)  03/15/24 225 lb 12.8 oz (102.4 kg)  03/08/24 229 lb 8 oz (104.1 kg)  01/24/24 229 lb 9.6 oz (104.1 kg)    BMI Readings from Last 5 Encounters:  05/09/24 29.95 kg/m  04/11/24 29.27 kg/m  03/15/24 29.79 kg/m  03/08/24 30.28 kg/m  01/24/24 30.29 kg/m     Physical Exam General: Sitting in chair, no acute distress Eyes: EOMI, no icterus Neck: Supple, no JVP Pulmonary:  Clear, normal company Cardiovascular: Regular rate and rhythm, no murmur Abdomen: Nondistended MSK: No synovitis, no joint effusion Neuro: Normal gait, no weakness Psych: Normal mood, full affect   Assessment & Plan:   Dyspnea on exertion: Suspect related to reactivation of asthma.  Given report of cough with phlegm, chest tightness, shortness of breath.  The intermittent nature or lack of reliably reproducible nature also argues for asthma which can have intermittent symptoms.  Recent cardiac evaluation including nuclear medicine perfusion test is reassuring.  Cardiac etiology seem less likely.  PFTs for further evaluation.  Notably left hemidiaphragm looks mildly elevated on serial images as far back as 2018 on CT scans as well.  Not necessarily obvious but certainly we see the left diaphragm on cuts of CT scan before the right diaphragm.  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  Notably, symptoms started after this was first demonstrated on imaging some years ago.  Moderate persistent asthma: Based on symptoms described above.  Recommend regular use of Advair  discus twice a day every day.  Intermittent use currently mainly at night.  Okay to use albuterol  as needed.  Will follow-up PFTs.  Consider escalation to higher dose Advair  (currently low-dose) versus Trelegy given improvement with albuterol  arguing for significant bronchodilator response.  Additional bronchodilation may be beneficial.  Waxing and waning lower lobe infiltrates: Suspect related to an silent reflux and GERD.  Waxes and wanes.  Continue PPI therapy.   Return in about 3 months (around 08/08/2024) for f/u Dr. Annella, after PFT.   Donnice JONELLE Annella, MD 05/09/2024   This appointment required 63 minutes of patient care (this includes precharting, chart review, review of results, face-to-face care, etc.).

## 2024-05-09 NOTE — Patient Instructions (Addendum)
 Nice to meet you   Please use your Advair  1 puff twice a day every day for the next several weeks.  Rinse her mouth out thoroughly with water after every use.  This would be the simplest way to see if regular inhaler use to treat asthma improves her symptoms.  If things are totally better we can stick with this, if things are no better or only slightly better I recommend we increase the Advair  dose or switch to Trelegy to see if extra bronchodilator will help.  Okay to use albuterol  as needed.  I ordered a full pulmonary function test without the use of albuterol  to further evaluate your symptoms.  Return to clinic in 2 to 3 months with Dr. Annella after pulmonary function test

## 2024-05-16 ENCOUNTER — Other Ambulatory Visit: Payer: Self-pay

## 2024-06-10 ENCOUNTER — Other Ambulatory Visit: Payer: Self-pay | Admitting: Family Medicine

## 2024-06-12 ENCOUNTER — Other Ambulatory Visit (HOSPITAL_BASED_OUTPATIENT_CLINIC_OR_DEPARTMENT_OTHER): Payer: Self-pay

## 2024-06-12 ENCOUNTER — Other Ambulatory Visit: Payer: Self-pay

## 2024-06-12 MED ORDER — METFORMIN HCL ER 750 MG PO TB24
750.0000 mg | ORAL_TABLET | Freq: Two times a day (BID) | ORAL | 1 refills | Status: AC
  Filled 2024-06-12: qty 180, 90d supply, fill #0
  Filled 2024-09-13: qty 180, 90d supply, fill #1

## 2024-06-12 MED ORDER — ONDANSETRON HCL 8 MG PO TABS
8.0000 mg | ORAL_TABLET | Freq: Three times a day (TID) | ORAL | 0 refills | Status: AC | PRN
  Filled 2024-06-12: qty 15, 5d supply, fill #0

## 2024-06-14 DIAGNOSIS — K080 Exfoliation of teeth due to systemic causes: Secondary | ICD-10-CM | POA: Diagnosis not present

## 2024-06-16 DIAGNOSIS — H35363 Drusen (degenerative) of macula, bilateral: Secondary | ICD-10-CM | POA: Diagnosis not present

## 2024-06-16 DIAGNOSIS — E119 Type 2 diabetes mellitus without complications: Secondary | ICD-10-CM | POA: Diagnosis not present

## 2024-06-16 DIAGNOSIS — H2513 Age-related nuclear cataract, bilateral: Secondary | ICD-10-CM | POA: Diagnosis not present

## 2024-06-16 DIAGNOSIS — H04123 Dry eye syndrome of bilateral lacrimal glands: Secondary | ICD-10-CM | POA: Diagnosis not present

## 2024-06-16 LAB — OPHTHALMOLOGY REPORT-SCANNED

## 2024-06-20 ENCOUNTER — Other Ambulatory Visit: Payer: Self-pay | Admitting: Family Medicine

## 2024-06-20 ENCOUNTER — Other Ambulatory Visit (HOSPITAL_BASED_OUTPATIENT_CLINIC_OR_DEPARTMENT_OTHER): Payer: Self-pay

## 2024-06-20 ENCOUNTER — Other Ambulatory Visit: Payer: Self-pay

## 2024-06-20 MED ORDER — ALBUTEROL SULFATE HFA 108 (90 BASE) MCG/ACT IN AERS
2.0000 | INHALATION_SPRAY | RESPIRATORY_TRACT | 1 refills | Status: AC | PRN
  Filled 2024-06-20: qty 6.7, 17d supply, fill #0
  Filled 2024-08-11: qty 6.7, 17d supply, fill #1

## 2024-06-20 MED ORDER — FLUTICASONE-SALMETEROL 100-50 MCG/ACT IN AEPB
1.0000 | INHALATION_SPRAY | Freq: Two times a day (BID) | RESPIRATORY_TRACT | 5 refills | Status: DC
  Filled 2024-06-20: qty 60, 30d supply, fill #0

## 2024-07-10 ENCOUNTER — Ambulatory Visit: Attending: Cardiovascular Disease | Admitting: Cardiovascular Disease

## 2024-07-10 ENCOUNTER — Encounter: Payer: Self-pay | Admitting: Cardiovascular Disease

## 2024-07-10 VITALS — BP 112/68 | HR 74 | Ht 74.0 in | Wt 235.2 lb

## 2024-07-10 DIAGNOSIS — I10 Essential (primary) hypertension: Secondary | ICD-10-CM | POA: Diagnosis not present

## 2024-07-10 DIAGNOSIS — I480 Paroxysmal atrial fibrillation: Secondary | ICD-10-CM

## 2024-07-10 DIAGNOSIS — E7849 Other hyperlipidemia: Secondary | ICD-10-CM | POA: Diagnosis not present

## 2024-07-10 DIAGNOSIS — I2510 Atherosclerotic heart disease of native coronary artery without angina pectoris: Secondary | ICD-10-CM | POA: Diagnosis not present

## 2024-07-10 NOTE — Progress Notes (Signed)
 Chief Complaint  Patient presents with   Follow-up    Atrial fibrillation, CAD   History of Present Illness: 69 yo male with history of CAD, paroxysmal atrial fibrillation, diabetes, HTN, hyperlipidemia, sleep apnea and obesity here today for cardiac follow up. He had a stent placed in the RCA in 1996. Cardiac cath February 2019 with 50% proximal RCA in-stent restenosis, 50-70% mid LAD stenosis, LVEF=50-55%. Cardiac PET CT stress test with no ischemia in July 2025. He has paroxysmal atrial fibrillation and is on Xarelto  and Sotalol . He underwent atrial fibrillation ablation in 2015. Last echo in December 2018 with LVEF=55-60%. No significant valve disease. Abdominal u/s February 2022 with no evidence of AAA.   He is here today for follow up. He has resting dyspnea that leads to chest pressure. He is seeing his pulmonologist tomorrow. He was treated for pneumonia in July 2025. He coughs up mucus after he feels short of breath. He has some palpitations. No lower extremity edema, orthopnea, PND, dizziness, near syncope or syncope.   He is from [place], [place] originally and graduated from [employer]. 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   Primary Care Physician: Micheal Wolm ORN, MD   Past Medical History:  Diagnosis Date   Allergy    Anal fissure    Chronic bronchitis ([name])    get it q spring and fall (10/27/2013)   Coronary artery disease    a. s/p PCI in 1996. b. LHC 01/2006 showed 50% mid LAD, otherwise widely patent cors with minimal irregularities. c. Nuclear stress test in 04/2009 was reportedly low risk, no ischemia. // d. Nuclear stress test 2/19: EF 55, transient ST depression in recovery, no ischemia, inf defect (artifact vs scar)    DM type 2, uncontrolled, with neuropathy    ED (erectile dysfunction)    Essential hypertension    Hx of echocardiogram    Echo (10/2013): Mild LVH, moderate focal basal hypertrophy of the septum, EF 50-55%, no RWMA    Hyperlipidemia    Internal hemorrhoids without mention of complication    Obesity    Obesity    OSA on CPAP    Paresthesia of both legs    Paroxysmal atrial fibrillation ([name])    a. failed Tikosyn , Multaq . b. s/p ablation of AF/AFL in 12/2013.   Paroxysmal atrial flutter ([name])    a. s/p ablation of AF/AFL in 12/2013.   Sleep apnea    Smoker    QUIT 10/13/13   Tubular adenoma of colon 2016    Past Surgical History:  Procedure Laterality Date   ABLATION  01-12-2014   PVI and CTI by Dr Kelsie   ANAL FISSURE REPAIR  ~ 2010   ATRIAL FIBRILLATION ABLATION N/A 01/12/2014   Procedure: ATRIAL FIBRILLATION ABLATION;  Surgeon: Lynwood JONETTA Kelsie, MD;  Location: [facility] CATH LAB;  Service: Cardiovascular;  Laterality: N/A;   CARDIAC CATHETERIZATION  2007   CORONARY ANGIOPLASTY WITH STENT PLACEMENT  1996   residual 50% LAD and RCA by cath in 2007   CORONARY ANGIOPLASTY WITH STENT PLACEMENT  1995   INGUINAL HERNIA REPAIR Right 1963   LEFT HEART CATH AND CORONARY ANGIOGRAPHY N/A 10/11/2017   Procedure: LEFT HEART CATH AND CORONARY ANGIOGRAPHY;  Surgeon: Claudene Victory ORN, MD;  Location: [facility] INVASIVE CV LAB;  Service: Cardiovascular;  Laterality: N/A;   TEE WITHOUT CARDIOVERSION N/A 01/11/2014   Procedure: TRANSESOPHAGEAL ECHOCARDIOGRAM (TEE);  Surgeon: Aleene JINNY Passe, MD;  Location: [facility] ENDOSCOPY;  Service: Cardiovascular;  Laterality: N/A;   TONSILLECTOMY AND ADENOIDECTOMY  1979    Current Outpatient Medications  Medication Sig Dispense Refill   albuterol  (VENTOLIN  HFA) 108 (90 Base) MCG/ACT inhaler Inhale 2 puffs into the lungs every 4-6 hours as needed for cough/wheeze. 6.7 g 1   atorvastatin  (LIPITOR) 40 MG tablet Take 1 tablet (40 mg total) by mouth at bedtime. 90 tablet 3   azelastine  (ASTELIN ) 0.1 % nasal spray Place 2 sprays into both nostrils 2 (two) times daily. Use in each nostril as directed 30 mL 2   diltiazem  (CARDIZEM  CD) 360 MG 24 hr capsule Take 1 capsule (360 mg total) by mouth daily. 90  capsule 3   empagliflozin  (JARDIANCE ) 10 MG TABS tablet Take 1 tablet (10 mg total) by mouth daily before breakfast. 90 tablet 2   fluticasone -salmeterol (ADVAIR  DISKUS) 100-50 MCG/ACT AEPB Inhale 1 puff into the lungs 2 (two) times daily. 60 each 5   icosapent  Ethyl (VASCEPA ) 1 g capsule Take 2 capsules (2 g total) by mouth 2 (two) times daily. 360 capsule 3   metFORMIN  (GLUCOPHAGE -XR) 750 MG 24 hr tablet Take 1 tablet (750 mg total) by mouth 2 (two) times daily. 180 tablet 1   nitroGLYCERIN  (NITROSTAT ) 0.4 MG SL tablet DISSOLVE 1 TABLET UNDER THE TONGUE EVERY 5 MINUTES FOR 3 DOSES AS NEEDED FOR CHEST PAIN(CALL 911 IF 3RD DOSE IS TAKEN) 25 tablet 3   ondansetron  (ZOFRAN ) 8 MG tablet Take 1 tablet (8 mg total) by mouth every 8 (eight) hours as needed for nausea or vomiting. 15 tablet 0   ONETOUCH DELICA LANCETS FINE MISC USE TO CHECK BLOOD SUGAR ONCE DAILY 100 each 0   rivaroxaban  (XARELTO ) 20 MG TABS tablet Take 1 tablet (20 mg total) by mouth daily with supper. 90 tablet 3   sotalol  (BETAPACE ) 80 MG tablet Take 1 tablet (80 mg total) by mouth 2 (two) times daily. 180 tablet 3   tadalafil  (CIALIS ) 20 MG tablet Take 1 tablet (20 mg total) by mouth daily as needed for erectile dysfunction. 10 tablet 3   OMEPRAZOLE  PO Take 5 mg by mouth daily at 6 (six) AM.     No current facility-administered medications for this visit.    Allergies  Allergen Reactions   Cephalosporins Anaphylaxis   Penicillins Anaphylaxis and Other (See Comments)    Has patient had a PCN reaction causing immediate rash, facial/tongue/throat swelling, SOB or lightheadedness with hypotension: Yes Has patient had a PCN reaction causing severe rash involving mucus membranes or skin necrosis: No Has patient had a PCN reaction that required hospitalization: No - went to doctors office Has patient had a PCN reaction occurring within the last 10 years: No If all of the above answers are NO, then may proceed with Cephalosporin  use.    Thallium Itching and Rash   Codeine Itching   Gabapentin  Other (See Comments)    made me feel drugged   Eliquis  [Apixaban ] Diarrhea    Social History   Socioeconomic History   Marital status: Married    Spouse name: Not on file   Number of children: Not on file   Years of education: Not on file   Highest education level: Not on file  Occupational History   Not on file  Tobacco Use   Smoking status: Former    Current packs/day: 0.00    Average packs/day: 1 pack/day for 43.0 years (43.0 ttl pk-yrs)    Types: Cigarettes    Start date: 67  Quit date: 2020    Years since quitting: 5.8   Smokeless tobacco: Never  Vaping Use   Vaping status: Never Used  Substance and Sexual Activity   Alcohol use: Yes    Alcohol/week: 0.0 standard drinks of alcohol    Comment: 10/27/2013 might have 1 beer/month   Drug use: No   Sexual activity: Yes  Other Topics Concern   Not on file  Social History Narrative   Works as an psychologist, 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 Corporate Investment Banker Strain: Low Risk  (09/23/2023)   Overall Financial Resource Strain (CARDIA)    Difficulty of Paying Living Expenses: Not hard at all  Food Insecurity: No Food Insecurity (09/23/2023)   Hunger Vital Sign    Worried About Running Out of Food in the Last Year: Never true    Ran Out of Food in the Last Year: Never true  Transportation Needs: No Transportation Needs (09/23/2023)   PRAPARE - Administrator, Civil Service (Medical): No    Lack of Transportation (Non-Medical): No  Physical Activity: Insufficiently Active (09/23/2023)   Exercise Vital Sign    Days of Exercise per Week: 2 days    Minutes of Exercise per Session: 60 min  Stress: No Stress Concern Present (09/23/2023)   [employer] of Occupational Health - Occupational Stress Questionnaire    Feeling of Stress : Not at all  Social Connections: Moderately Integrated (09/23/2023)   Social Connection and  Isolation Panel    Frequency of Communication with Friends and Family: More than three times a week    Frequency of Social Gatherings with Friends and Family: More than three times a week    Attends Religious Services: Never    Database Administrator or Organizations: Yes    Attends Engineer, Structural: More than 4 times per year    Marital Status: Married  Catering Manager Violence: Not At Risk (09/23/2023)   Humiliation, Afraid, Rape, and Kick questionnaire    Fear of Current or Ex-Partner: No    Emotionally Abused: No    Physically Abused: No    Sexually Abused: No    Family History  Problem Relation Age of Onset   Diabetes Mother    Heart disease Mother 59       Arrhythmia   Hypertension Mother    Heart disease Father 39       died 55 CHF   Heart attack Father    Heart disease Brother    Hyperlipidemia Other    Heart disease Other    Stroke Neg Hx    Colon cancer Neg Hx    Esophageal cancer Neg Hx    Liver disease Neg Hx    Pancreatic cancer Neg Hx    Stomach cancer Neg Hx     Review of Systems:  As stated in the HPI and otherwise negative.   BP 112/68   Pulse 74   Ht 6' 2 (1.88 m)   Wt 235 lb 3.2 oz (106.7 kg)   SpO2 95%   BMI 30.20 kg/m   Physical Examination: General: Well developed, well nourished, NAD  HEENT: OP clear, mucus membranes moist  SKIN: warm, dry. No rashes. Neuro: No focal deficits  Musculoskeletal: Muscle strength 5/5 all ext  Psychiatric: Mood and affect normal  Neck: No JVD, no carotid bruits, no thyromegaly, no lymphadenopathy.  Lungs:Clear bilaterally, no wheezes, rhonci, crackles Cardiovascular: Regular rate and rhythm. No murmurs, gallops or rubs.  Abdomen:Soft. Bowel sounds present. Non-tender.  Extremities: No lower extremity edema. Pulses are 2 + in the bilateral DP/PT.  EKG:  EKG is ordered today. The ekg ordered today demonstrates  EKG Interpretation Date/Time:  Monday July 10 2024 08:31:05 EST Ventricular  Rate:  74 PR Interval:  162 QRS Duration:  106 QT Interval:  384 QTC Calculation: 426 R Axis:   73  Text Interpretation: Sinus rhythm with Premature supraventricular complexes When compared with ECG of 15-Aug-2017 06:44, Premature supraventricular complexes are now Present Confirmed by Verlin Bruckner [phone]) on 07/10/2024 8:31:52 AM   Recent Labs: 09/23/2023: ALT 24   Lipid Panel    Component Value Date/Time   CHOL 111 09/23/2023 0901   TRIG 159 (H) 09/23/2023 0901   HDL 36 (L) 09/23/2023 0901   CHOLHDL 3.1 09/23/2023 0901   CHOLHDL 4 03/24/2023 0922   VLDL 43.2 (H) 03/24/2023 0922   LDLCALC 48 09/23/2023 0901   LDLCALC  07/01/2020 1411     Comment:     . LDL cholesterol not calculated. Triglyceride levels greater than 400 mg/dL invalidate calculated LDL results. . Reference range: <100 . Desirable range <100 mg/dL for primary prevention;   <70 mg/dL for patients with CHD or diabetic patients  with > or = 2 CHD risk factors. SABRA LDL-C is now calculated using the Martin-Hopkins  calculation, which is a validated novel method providing  better accuracy than the Friedewald equation in the  estimation of LDL-C.  Gladis APPLETHWAITE et al. SANDREA. 7986;689(80): 2061-2068  (http://education.[facility].com/faq/FAQ164)    LDLDIRECT 76.0 03/24/2023 0922     Wt Readings from Last 3 Encounters:  07/10/24 235 lb 3.2 oz (106.7 kg)  05/09/24 227 lb (103 kg)  04/11/24 228 lb (103.4 kg)    Assessment and Plan:   1. CAD without angina: Last cardiac cath in 2019 with moderate RCA stent restenosis and moderate LAD stenosis that was not felt to be flow limiting. Cardiac PET CT in July 2025 with no ischemia. He has some chest pain in the setting of dyspnea and cough. No exertional chest pressure. We discussed a cardiac cath but will wait until his pulmonary workup is complete. Will arrange an echo to assess LVEF. Continue statin. He is not on an ASA since he is on Xarelto .    2. HTN: BP  is well controlled. Continue current therapy  3. HLD: LDL 48 in January 2025. Continue statin. .    4. Paroxysmal atrial fibrillation: He is in sinus today. Continue Cardizem , sotalol  and Xarelto   Labs/ tests ordered today include:  Orders Placed This Encounter  Procedures   EKG 12-Lead   ECHOCARDIOGRAM COMPLETE   Disposition:   F/U with me in 1 months  Signed, Bruckner Verlin, MD, [facility] 07/10/2024 9:08 AM    [facility] [address] [place], [place], [place]  [address] Phone: [phone]; Fax: [phone]

## 2024-07-10 NOTE — Patient Instructions (Signed)
 Medication Instructions:  Your physician recommends that you continue on your current medications as directed. Please refer to the Current Medication list given to you today.  *If you need a refill on your cardiac medications before your next appointment, please call your pharmacy*  Lab Work: none If you have labs (blood work) drawn today and your tests are completely normal, you will receive your results only by: MyChart Message (if you have MyChart) OR A paper copy in the mail If you have any lab test that is abnormal or we need to change your treatment, we will call you to review the results.  Testing/Procedures: Your physician has requested that you have an echocardiogram. Echocardiography is a painless test that uses sound waves to create images of your heart. It provides your doctor with information about the size and shape of your heart and how well your heart's chambers and valves are working. This procedure takes approximately one hour. There are no restrictions for this procedure. Please do NOT wear cologne, perfume, aftershave, or lotions (deodorant is allowed). Please arrive 15 minutes prior to your appointment time.  Please note: We ask at that you not bring children with you during ultrasound (echo/ vascular) testing. Due to room size and safety concerns, children are not allowed in the ultrasound rooms during exams. Our front office staff cannot provide observation of children in our lobby area while testing is being conducted. An adult accompanying a patient to their appointment will only be allowed in the ultrasound room at the discretion of the ultrasound technician under special circumstances. We apologize for any inconvenience.   Follow-Up: At [facility], you and your health needs are our priority.  As part of our continuing mission to provide you with exceptional heart care, our providers are all part of one team.  This team includes your primary Cardiologist  (physician) and Advanced Practice Providers or APPs (Physician Assistants and Nurse Practitioners) who all work together to provide you with the care you need, when you need it.  Your next appointment:   December 18 at 10:40  Provider:   Lonni Cash, MD    We recommend signing up for the patient portal called MyChart.  Sign up information is provided on this After Visit Summary.  MyChart is used to connect with patients for Virtual Visits (Telemedicine).  Patients are able to view lab/test results, encounter notes, upcoming appointments, etc.  Non-urgent messages can be sent to your provider as well.   To learn more about what you can do with MyChart, go to [email or web address].   Other Instructions

## 2024-07-11 ENCOUNTER — Other Ambulatory Visit (HOSPITAL_BASED_OUTPATIENT_CLINIC_OR_DEPARTMENT_OTHER): Payer: Self-pay

## 2024-07-11 ENCOUNTER — Ambulatory Visit: Admitting: *Deleted

## 2024-07-11 ENCOUNTER — Encounter: Payer: Self-pay | Admitting: Pulmonary Disease

## 2024-07-11 ENCOUNTER — Ambulatory Visit: Admitting: Pulmonary Disease

## 2024-07-11 VITALS — BP 121/76 | HR 74 | Ht 73.0 in | Wt 233.0 lb

## 2024-07-11 DIAGNOSIS — J4550 Severe persistent asthma, uncomplicated: Secondary | ICD-10-CM | POA: Diagnosis not present

## 2024-07-11 DIAGNOSIS — R0609 Other forms of dyspnea: Secondary | ICD-10-CM

## 2024-07-11 LAB — PULMONARY FUNCTION TEST
DL/VA % pred: 91 %
DL/VA: 3.69 ml/min/mmHg/L
DLCO cor % pred: 93 %
DLCO cor: 26.95 ml/min/mmHg
DLCO unc % pred: 93 %
DLCO unc: 26.95 ml/min/mmHg
FEF 25-75 Post: 1.67 L/s
FEF 25-75 Pre: 1.41 L/s
FEF2575-%Change-Post: 18 %
FEF2575-%Pred-Post: 57 %
FEF2575-%Pred-Pre: 48 %
FEV1-%Change-Post: 2 %
FEV1-%Pred-Post: 73 %
FEV1-%Pred-Pre: 72 %
FEV1-Post: 2.77 L
FEV1-Pre: 2.71 L
FEV1FVC-%Change-Post: -1 %
FEV1FVC-%Pred-Pre: 87 %
FEV6-%Change-Post: 2 %
FEV6-%Pred-Post: 87 %
FEV6-%Pred-Pre: 85 %
FEV6-Post: 4.22 L
FEV6-Pre: 4.1 L
FEV6FVC-%Change-Post: 0 %
FEV6FVC-%Pred-Post: 103 %
FEV6FVC-%Pred-Pre: 104 %
FVC-%Change-Post: 3 %
FVC-%Pred-Post: 85 %
FVC-%Pred-Pre: 82 %
FVC-Post: 4.3 L
FVC-Pre: 4.15 L
Post FEV1/FVC ratio: 64 %
Post FEV6/FVC ratio: 98 %
Pre FEV1/FVC ratio: 65 %
Pre FEV6/FVC Ratio: 99 %
RV % pred: 171 %
RV: 4.38 L
TLC % pred: 118 %
TLC: 9.03 L

## 2024-07-11 MED ORDER — TRELEGY ELLIPTA 200-62.5-25 MCG/ACT IN AEPB
1.0000 | INHALATION_SPRAY | Freq: Every day | RESPIRATORY_TRACT | 11 refills | Status: AC
  Filled 2024-07-11: qty 60, 30d supply, fill #0
  Filled 2024-08-11: qty 60, 30d supply, fill #1

## 2024-07-11 NOTE — Progress Notes (Signed)
 @Patient  ID: Eric Palmer, male    DOB: 1956/03/09, 68 y.o.   MRN: [id number]  Chief Complaint  Patient presents with   Medical Management of Chronic Issues    Pt states post PFT     Referring provider: Nanami Whitelaw, Donnice SAUNDERS, MD  HPI:   68 y.o. male with history of childhood asthma and history of cigarette smoking now in remission whom are seen for evaluation of dyspnea on exertion.  Most recent cardiology note reviewed.  Returns for routine follow-up.  Symptoms largely unchanged.  A lot of chest tightness.  Shortness of breath.  Comes and goes.  Not reliably reproducible.  Recent seen by cardiology.  Echocardiogram ordered.  They discussed with sounds at left heart card through station in the future if symptoms persist.  PFTs performed today.  For interpretation below, most notable for borderline COPD and air trapping with normal DLCO.  Given normal DLCO, Asthma seems to be primary driver.  We discussed escalation of inhaler therapy and its role and rationale in terms of air trapping.  Certainly air trapping can cause significant dyspnea exertion as we discussed in detail today.  HPI initial visit: Notes symptom started about a year ago.  Shortness of breath and chest tightness.  Likely spasm in his air tubes.  Tightness.  Difficulty breathing.  Associated with cough productive with phlegm.  Symptoms come and go.  Not reliably reproducible.  No clear pattern to symptoms or trigger for symptoms.  Often occurs when outdoors.  He reports history of significant lifelong seasonal allergies.  He has a history of asthma and uses Advair .  Mainly at night.  Not regular during the day.  Occasionally when he is more symptomatic.  He is albuterol  and this does improve his symptoms.  He uses this sparingly as it will often flare or cause a run of atrial fibrillation.  He follows closely with atrial fibrillation clinic.  He has been in sinus rhythm as of late.  Typically with his atrial fibrillation he  feels butterflies in his chest.  He has not felt the sensation with recent symptoms.  He had a stent placed at age 71.  Significant CAD.  He had a recent NM perfusion scan that shows no significant blockages, normal EF per my review.  He notes OSA on CPAP in the past.  Managed by cardiology.  Repeat sleep test after significant weight loss yielded no sleep apnea per his report.  He was taken off CPAP.  He does feel like his breathing issues started or worsened after stopping CPAP.  At least the timeline fits with his report.  Reviewed serial images including yearly CT scan for lung cancer screening which demonstrates mild emphysematous changes and bronchial wall thickening concerning for chronic bronchitis versus asthma.  His most recent scan 08/2023 noted a left lower lobe nodule, this was resolved or shrinking on CT coronary scan 09/2023.  Recent NM perfusion scan demonstrated left lower lobe infiltrate.  He denies any symptoms no cough fever chills.  No pneumonia symptoms.  He went to the ED and was prescribed clindamycin .  Again he had no symptoms of pneumonia.  He does endorse a history of GERD.  He takes PPI regularly.  No significant acid symptoms.  Questionaires / Pulmonary Flowsheets:   ACT:      No data to display          MMRC:     No data to display  Epworth:      No data to display          Tests:   FENO:  No results found for: NITRICOXIDE  PFT:    Latest Ref Rng & Units 07/11/2024    8:38 AM  PFT Results  FVC-Pre L 4.15  P  FVC-Predicted Pre % 82  P  FVC-Post L 4.30  P  FVC-Predicted Post % 85  P  Pre FEV1/FVC % % 65  P  Post FEV1/FCV % % 64  P  FEV1-Pre L 2.71  P  FEV1-Predicted Pre % 72  P  FEV1-Post L 2.77  P  DLCO uncorrected ml/min/mmHg 26.95  P  DLCO UNC% % 93  P  DLCO corrected ml/min/mmHg 26.95  P  DLCO COR %Predicted % 93  P  DLVA Predicted % 91  P  TLC L 9.03  P  TLC % Predicted % 118  P  RV % Predicted % 171  P    P  Preliminary result  Personally reviewed, spirometry consistent with mild COPD.  No significant bronchodilator response.  Lung volumes consistent with air trapping, DLCO within normal limits.  WALK:      No data to display          Imaging: Personally reviewed and as per EMR and discussion this note No results found.  Lab Results: Personally reviewed CBC    Component Value Date/Time   WBC 8.7 06/07/2023 1103   RBC 5.44 06/07/2023 1103   HGB 16.6 06/07/2023 1103   HGB 16.1 06/29/2019 0724   HCT 49.9 06/07/2023 1103   HCT 46.4 06/29/2019 0724   PLT 285.0 06/07/2023 1103   PLT 237 06/29/2019 0724   MCV 91.6 06/07/2023 1103   MCV 90 06/29/2019 0724   MCH 30.6 07/01/2020 1411   MCHC 33.4 06/07/2023 1103   RDW 13.3 06/07/2023 1103   RDW 12.7 06/29/2019 0724   LYMPHSABS 2.6 06/07/2023 1103   MONOABS 0.7 06/07/2023 1103   EOSABS 0.4 06/07/2023 1103   BASOSABS 0.1 06/07/2023 1103    BMET    Component Value Date/Time   NA 138 06/07/2023 1103   NA 140 06/29/2019 0724   K 4.5 06/07/2023 1103   CL 102 06/07/2023 1103   CO2 28 06/07/2023 1103   GLUCOSE 124 (H) 06/07/2023 1103   BUN 16 06/07/2023 1103   BUN 12 06/29/2019 0724   CREATININE 1.08 06/07/2023 1103   CREATININE 1.23 07/01/2020 1411   CALCIUM  9.6 06/07/2023 1103   GFRNONAA 82 06/29/2019 0724   GFRAA 95 06/29/2019 0724    BNP No results found for: BNP  ProBNP    Component Value Date/Time   PROBNP 6.6 10/05/2013 2327    Specialty Problems       Pulmonary Problems   Allergic rhinitis   Qualifier: Diagnosis of  By: Micheal MD, Bruce   IMO SNOMED Dx Update Oct 2024      Asthma   Qualifier: Diagnosis of  By: Micheal MD, Bruce         Allergies  Allergen Reactions   Cephalosporins Anaphylaxis   Penicillins Anaphylaxis and Other (See Comments)    Has patient had a PCN reaction causing immediate rash, facial/tongue/throat swelling, SOB or lightheadedness with hypotension: Yes Has  patient had a PCN reaction causing severe rash involving mucus membranes or skin necrosis: No Has patient had a PCN reaction that required hospitalization: No - went to doctors office Has patient had a PCN reaction occurring within the  last 10 years: No If all of the above answers are NO, then may proceed with Cephalosporin use.    Thallium Itching and Rash   Codeine Itching   Gabapentin  Other (See Comments)    made me feel drugged   Eliquis  [Apixaban ] Diarrhea    Immunization History  Administered Date(s) Administered   Fluad Quad(high Dose 65+) 07/07/2022   Influenza Split 07/27/2011, 06/03/2012   Influenza,inj,Quad PF,6+ Mos 07/01/2020, 06/04/2021   Influenza-Unspecified 04/24/2013   Janssen (J&J) SARS-COV-2 Vaccination 02/22/2020   PNEUMOCOCCAL CONJUGATE-20 07/07/2022   Pneumococcal Conjugate-13 01/01/2011   Pneumococcal Polysaccharide-23 01/01/2011   Tdap 02/01/2012   Zoster, Live 11/08/2015    Past Medical History:  Diagnosis Date   Allergy    Anal fissure    Chronic bronchitis ([name])    get it q spring and fall (10/27/2013)   Coronary artery disease    a. s/p PCI in 1996. b. LHC 01/2006 showed 50% mid LAD, otherwise widely patent cors with minimal irregularities. c. Nuclear stress test in 04/2009 was reportedly low risk, no ischemia. // d. Nuclear stress test 2/19: EF 55, transient ST depression in recovery, no ischemia, inf defect (artifact vs scar)    DM type 2, uncontrolled, with neuropathy    ED (erectile dysfunction)    Essential hypertension    Hx of echocardiogram    Echo (10/2013): Mild LVH, moderate focal basal hypertrophy of the septum, EF 50-55%, no RWMA   Hyperlipidemia    Internal hemorrhoids without mention of complication    Obesity    Obesity    OSA on CPAP    Paresthesia of both legs    Paroxysmal atrial fibrillation ([name])    a. failed Tikosyn , Multaq . b. s/p ablation of AF/AFL in 12/2013.   Paroxysmal atrial flutter ([name])    a. s/p ablation of  AF/AFL in 12/2013.   Sleep apnea    Smoker    QUIT 10/13/13   Tubular adenoma of colon 2016    Tobacco History: Social History   Tobacco Use  Smoking Status Former   Current packs/day: 0.00   Average packs/day: 1 pack/day for 43.0 years (43.0 ttl pk-yrs)   Types: Cigarettes   Start date: 18   Quit date: 2020   Years since quitting: 5.8  Smokeless Tobacco Never   Counseling given: Not Answered   Continue to not smoke  Outpatient Encounter Medications as of 07/11/2024  Medication Sig   albuterol  (VENTOLIN  HFA) 108 (90 Base) MCG/ACT inhaler Inhale 2 puffs into the lungs every 4-6 hours as needed for cough/wheeze.   atorvastatin  (LIPITOR) 40 MG tablet Take 1 tablet (40 mg total) by mouth at bedtime.   azelastine  (ASTELIN ) 0.1 % nasal spray Place 2 sprays into both nostrils 2 (two) times daily. Use in each nostril as directed   diltiazem  (CARDIZEM  CD) 360 MG 24 hr capsule Take 1 capsule (360 mg total) by mouth daily.   empagliflozin  (JARDIANCE ) 10 MG TABS tablet Take 1 tablet (10 mg total) by mouth daily before breakfast.   Fluticasone -Umeclidin-Vilant (TRELEGY ELLIPTA) 200-62.5-25 MCG/ACT AEPB Inhale 1 puff into the lungs daily.   icosapent  Ethyl (VASCEPA ) 1 g capsule Take 2 capsules (2 g total) by mouth 2 (two) times daily.   metFORMIN  (GLUCOPHAGE -XR) 750 MG 24 hr tablet Take 1 tablet (750 mg total) by mouth 2 (two) times daily.   nitroGLYCERIN  (NITROSTAT ) 0.4 MG SL tablet DISSOLVE 1 TABLET UNDER THE TONGUE EVERY 5 MINUTES FOR 3 DOSES AS NEEDED FOR CHEST PAIN(CALL 911  IF 3RD DOSE IS TAKEN)   ondansetron  (ZOFRAN ) 8 MG tablet Take 1 tablet (8 mg total) by mouth every 8 (eight) hours as needed for nausea or vomiting.   ONETOUCH DELICA LANCETS FINE MISC USE TO CHECK BLOOD SUGAR ONCE DAILY   rivaroxaban  (XARELTO ) 20 MG TABS tablet Take 1 tablet (20 mg total) by mouth daily with supper.   sotalol  (BETAPACE ) 80 MG tablet Take 1 tablet (80 mg total) by mouth 2 (two) times daily.    tadalafil  (CIALIS ) 20 MG tablet Take 1 tablet (20 mg total) by mouth daily as needed for erectile dysfunction.   [DISCONTINUED] fluticasone -salmeterol (ADVAIR  DISKUS) 100-50 MCG/ACT AEPB Inhale 1 puff into the lungs 2 (two) times daily.   OMEPRAZOLE  PO Take 5 mg by mouth daily at 6 (six) AM.   No facility-administered encounter medications on file as of 07/11/2024.     Review of Systems  Review of Systems  N/a Physical Exam  BP 121/76   Pulse 74   Ht 6' 1 (1.854 m)   Wt 233 lb (105.7 kg)   SpO2 93%   BMI 30.74 kg/m   Wt Readings from Last 5 Encounters:  07/11/24 233 lb (105.7 kg)  07/10/24 235 lb 3.2 oz (106.7 kg)  05/09/24 227 lb (103 kg)  04/11/24 228 lb (103.4 kg)  03/15/24 225 lb 12.8 oz (102.4 kg)    BMI Readings from Last 5 Encounters:  07/11/24 30.74 kg/m  07/10/24 30.20 kg/m  05/09/24 29.95 kg/m  04/11/24 29.27 kg/m  03/15/24 29.79 kg/m     Physical Exam General: In chair, no distress Eyes: No icterus Neck: No JVP Pulmonary: Clear bilaterally, normal work of breathing, good air excursion Abdomen: Nondistended  Assessment & Plan:   Dyspnea on exertion: Mo suspicion for reactivation of asthma.  PFTs consistent with mild fixed obstruction and normal DLCO with air trapping.  Pattern most consistent with small disease or asthma.  Would expect DLCO to be decreased if emphysema is primary contributor.  In addition, coronary disease, cardiac contributors should be evaluated.  He has ongoing cardiology evaluation which I encourage completing.  Altered inhalers as below.  Severe persistent asthma/COPD overlap: Pulmonary function test with mild fixed obstruction, air trapping, normal DLCO.  Symptoms of chest tightness, shortness of breath could be related to asthma.  Stop Advair  low-dose, escalate to high-dose Trelegy for additional bronchodilation in the setting of air trapping.  Consider addition of Ohtuvayre  if not improving in the future.  Notably eosinophils  elevated to 400, consider addition of biologic therapies if not improving.  Waxing and waning lower lobe infiltrates: Suspect related to an silent reflux and GERD.  Waxes and wanes.    Tobacco abuse in remission: Due for lung cancer CT scan 08/2024, he is encouraged to schedule this and follow-up with me afterwards.   Return in about 2 months (around 09/10/2024) for f/u Dr. Annella, after CT scan.   Donnice JONELLE Annella, MD 07/11/2024

## 2024-07-11 NOTE — Progress Notes (Signed)
 Full PFT performed today.

## 2024-07-11 NOTE — Patient Instructions (Signed)
 Full PFT performed today.

## 2024-07-11 NOTE — Patient Instructions (Signed)
 Nice to see you again  The pulmonary function test show borderline COPD.  It does demonstrate air trapping on lung volumes.  Air trapping is quite a potent driver of shortness of breath.  To better address this, I recommend we stop Advair  and start Trelegy.  Trelegy is 1 puff once a day.  The Trelegy has similar medicine to Advair  but adds an extra bronchodilator.  The hope is this will help get air in and out more easily and help with your shortness of breath.  You have a repeat CT scan recommended to be done in January 2026, hopefully you can get that scheduled.  Will plan to meet afterward to discuss results and how your breathing is doing with the new medication.  Return to clinic in 2 months after CT scan with Dr. Annella

## 2024-07-26 ENCOUNTER — Other Ambulatory Visit (HOSPITAL_BASED_OUTPATIENT_CLINIC_OR_DEPARTMENT_OTHER): Payer: Self-pay

## 2024-07-26 ENCOUNTER — Other Ambulatory Visit: Payer: Self-pay | Admitting: Family Medicine

## 2024-07-26 ENCOUNTER — Other Ambulatory Visit: Payer: Self-pay

## 2024-07-26 MED ORDER — AZELASTINE HCL 0.1 % NA SOLN
2.0000 | Freq: Two times a day (BID) | NASAL | 2 refills | Status: AC
  Filled 2024-07-26: qty 30, 50d supply, fill #0

## 2024-07-31 ENCOUNTER — Ambulatory Visit: Payer: Self-pay | Admitting: Cardiovascular Disease

## 2024-07-31 ENCOUNTER — Ambulatory Visit (HOSPITAL_BASED_OUTPATIENT_CLINIC_OR_DEPARTMENT_OTHER)
Admission: RE | Admit: 2024-07-31 | Discharge: 2024-07-31 | Attending: Cardiovascular Disease | Admitting: Cardiovascular Disease

## 2024-07-31 DIAGNOSIS — I480 Paroxysmal atrial fibrillation: Secondary | ICD-10-CM | POA: Diagnosis not present

## 2024-07-31 DIAGNOSIS — I2510 Atherosclerotic heart disease of native coronary artery without angina pectoris: Secondary | ICD-10-CM

## 2024-07-31 LAB — ECHOCARDIOGRAM COMPLETE
AR max vel: 2.62 cm2
AV Area VTI: 2.6 cm2
AV Area mean vel: 2.56 cm2
AV Mean grad: 4 mmHg
AV Peak grad: 8.3 mmHg
Ao pk vel: 1.44 m/s
Area-P 1/2: 3.99 cm2
Calc EF: 69.4 %
MV M vel: 4.21 m/s
MV Peak grad: 70.9 mmHg
S' Lateral: 1.8 cm
Single Plane A2C EF: 68.9 %
Single Plane A4C EF: 68.8 %

## 2024-08-10 ENCOUNTER — Ambulatory Visit: Admitting: Cardiovascular Disease

## 2024-08-10 ENCOUNTER — Encounter: Payer: Self-pay | Admitting: Cardiovascular Disease

## 2024-08-10 VITALS — BP 104/74 | HR 75 | Ht 73.0 in | Wt 231.2 lb

## 2024-08-10 DIAGNOSIS — I2510 Atherosclerotic heart disease of native coronary artery without angina pectoris: Secondary | ICD-10-CM | POA: Diagnosis not present

## 2024-08-10 DIAGNOSIS — I480 Paroxysmal atrial fibrillation: Secondary | ICD-10-CM

## 2024-08-10 DIAGNOSIS — I340 Nonrheumatic mitral (valve) insufficiency: Secondary | ICD-10-CM

## 2024-08-10 DIAGNOSIS — E7849 Other hyperlipidemia: Secondary | ICD-10-CM | POA: Diagnosis not present

## 2024-08-10 DIAGNOSIS — I10 Essential (primary) hypertension: Secondary | ICD-10-CM

## 2024-08-10 NOTE — Patient Instructions (Signed)

## 2024-08-10 NOTE — Progress Notes (Signed)
 Chief Complaint  Patient presents with   Follow-up    CAD, PAF   History of Present Illness: 68 yo male with history of CAD, paroxysmal atrial fibrillation, diabetes, HTN, hyperlipidemia, sleep apnea and obesity here today for cardiac follow up. He has paroxysmal atrial fibrillation and underwent atrial fibrillation ablation in 2015. He is on Xarelto  and Sotalol . He has CAD and had a stent placed in the RCA in 1996. Cardiac cath February 2019 with 50% proximal RCA in-stent restenosis, 50-70% mid LAD stenosis. Cardiac PET CT stress test with no ischemia in July 2025. He was seen in our office in November 2025 and reported dyspnea and cough with chest pressure. Echo 07/31/24 with LVEF=60-65%. Mild MR.   He is here today for follow up. His dyspnea and chest tightness resolved with adjustment of his medical therapy for his asthma. He denies any chest pain, dyspnea, palpitations, lower extremity edema, orthopnea, PND, dizziness, near syncope or syncope.   He is from [place], [place] originally and graduated from [employer]. 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   Primary Care Physician: Micheal Wolm ORN, MD   Past Medical History:  Diagnosis Date   Allergy    Anal fissure    Chronic bronchitis ([name])    get it q spring and fall (10/27/2013)   Coronary artery disease    a. s/p PCI in 1996. b. LHC 01/2006 showed 50% mid LAD, otherwise widely patent cors with minimal irregularities. c. Nuclear stress test in 04/2009 was reportedly low risk, no ischemia. // d. Nuclear stress test 2/19: EF 55, transient ST depression in recovery, no ischemia, inf defect (artifact vs scar)    DM type 2, uncontrolled, with neuropathy    ED (erectile dysfunction)    Essential hypertension    Hx of echocardiogram    Echo (10/2013): Mild LVH, moderate focal basal hypertrophy of the septum, EF 50-55%, no RWMA   Hyperlipidemia    Internal hemorrhoids without mention of complication    Obesity     Obesity    OSA on CPAP    Paresthesia of both legs    Paroxysmal atrial fibrillation ([name])    a. failed Tikosyn , Multaq . b. s/p ablation of AF/AFL in 12/2013.   Paroxysmal atrial flutter ([name])    a. s/p ablation of AF/AFL in 12/2013.   Sleep apnea    Smoker    QUIT 10/13/13   Tubular adenoma of colon 2016    Past Surgical History:  Procedure Laterality Date   ABLATION  01-12-2014   PVI and CTI by Dr Kelsie   ANAL FISSURE REPAIR  ~ 2010   ATRIAL FIBRILLATION ABLATION N/A 01/12/2014   Procedure: ATRIAL FIBRILLATION ABLATION;  Surgeon: Lynwood JONETTA Kelsie, MD;  Location: [facility] CATH [facility];  Service: Cardiovascular;  Laterality: N/A;   CARDIAC CATHETERIZATION  2007   CORONARY ANGIOPLASTY WITH STENT PLACEMENT  1996   residual 50% LAD and RCA by cath in 2007   CORONARY ANGIOPLASTY WITH STENT PLACEMENT  1995   INGUINAL HERNIA REPAIR Right 1963   LEFT HEART CATH AND CORONARY ANGIOGRAPHY N/A 10/11/2017   Procedure: LEFT HEART CATH AND CORONARY ANGIOGRAPHY;  Surgeon: Claudene Victory ORN, MD;  Location: [facility] INVASIVE CV [facility];  Service: Cardiovascular;  Laterality: N/A;   TEE WITHOUT CARDIOVERSION N/A 01/11/2014   Procedure: TRANSESOPHAGEAL ECHOCARDIOGRAM (TEE);  Surgeon: Aleene JINNY Passe, MD;  Location: [facility] ENDOSCOPY;  Service: Cardiovascular;  Laterality: N/A;   TONSILLECTOMY AND ADENOIDECTOMY  1979  Current Outpatient Medications  Medication Sig Dispense Refill   albuterol  (VENTOLIN  HFA) 108 (90 Base) MCG/ACT inhaler Inhale 2 puffs into the lungs every 4-6 hours as needed for cough/wheeze. 6.7 g 1   atorvastatin  (LIPITOR) 40 MG tablet Take 1 tablet (40 mg total) by mouth at bedtime. 90 tablet 3   azelastine  (ASTELIN ) 0.1 % nasal spray Place 2 sprays into both nostrils 2 (two) times daily. Use in each nostril as directed 30 mL 2   diltiazem  (CARDIZEM  CD) 360 MG 24 hr capsule Take 1 capsule (360 mg total) by mouth daily. 90 capsule 3   empagliflozin  (JARDIANCE ) 10 MG TABS tablet Take 1 tablet (10 mg total) by mouth  daily before breakfast. 90 tablet 2   Fluticasone -Umeclidin-Vilant (TRELEGY ELLIPTA ) 200-62.5-25 MCG/ACT AEPB Inhale 1 puff into the lungs daily. 60 each 11   icosapent  Ethyl (VASCEPA ) 1 g capsule Take 2 capsules (2 g total) by mouth 2 (two) times daily. 360 capsule 3   metFORMIN  (GLUCOPHAGE -XR) 750 MG 24 hr tablet Take 1 tablet (750 mg total) by mouth 2 (two) times daily. 180 tablet 1   nitroGLYCERIN  (NITROSTAT ) 0.4 MG SL tablet DISSOLVE 1 TABLET UNDER THE TONGUE EVERY 5 MINUTES FOR 3 DOSES AS NEEDED FOR CHEST PAIN(CALL 911 IF 3RD DOSE IS TAKEN) 25 tablet 3   ondansetron  (ZOFRAN ) 8 MG tablet Take 1 tablet (8 mg total) by mouth every 8 (eight) hours as needed for nausea or vomiting. 15 tablet 0   ONETOUCH DELICA LANCETS FINE MISC USE TO CHECK BLOOD SUGAR ONCE DAILY 100 each 0   rivaroxaban  (XARELTO ) 20 MG TABS tablet Take 1 tablet (20 mg total) by mouth daily with supper. 90 tablet 3   sotalol  (BETAPACE ) 80 MG tablet Take 1 tablet (80 mg total) by mouth 2 (two) times daily. 180 tablet 3   tadalafil  (CIALIS ) 20 MG tablet Take 1 tablet (20 mg total) by mouth daily as needed for erectile dysfunction. 10 tablet 3   No current facility-administered medications for this visit.    Allergies  Allergen Reactions   Cephalosporins Anaphylaxis   Penicillins Anaphylaxis and Other (See Comments)    Has patient had a PCN reaction causing immediate rash, facial/tongue/throat swelling, SOB or lightheadedness with hypotension: Yes Has patient had a PCN reaction causing severe rash involving mucus membranes or skin necrosis: No Has patient had a PCN reaction that required hospitalization: No - went to doctors office Has patient had a PCN reaction occurring within the last 10 years: No If all of the above answers are NO, then may proceed with Cephalosporin use.    Thallium Itching and Rash   Codeine Itching   Gabapentin  Other (See Comments)    made me feel drugged   Eliquis  [Apixaban ] Diarrhea     Social History   Socioeconomic History   Marital status: Married    Spouse name: Not on file   Number of children: Not on file   Years of education: Not on file   Highest education level: Not on file  Occupational History   Not on file  Tobacco Use   Smoking status: Former    Current packs/day: 0.00    Average packs/day: 1 pack/day for 43.0 years (43.0 ttl pk-yrs)    Types: Cigarettes    Start date: 86    Quit date: 2020    Years since quitting: 5.9   Smokeless tobacco: Never  Vaping Use   Vaping status: Never Used  Substance and Sexual Activity  Alcohol use: Yes    Alcohol/week: 0.0 standard drinks of alcohol    Comment: 10/27/2013 might have 1 beer/month   Drug use: No   Sexual activity: Yes  Other Topics Concern   Not on file  Social History Narrative   Works as an psychologist, 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 [employer]   Tobacco Use: Medium Risk (08/10/2024)   Patient History    Smoking Tobacco Use: Former    Smokeless Tobacco Use: Never    Passive Exposure: Not on Actuary Strain: Low Risk (09/23/2023)   Overall Financial Resource Strain (CARDIA)    Difficulty of Paying Living Expenses: Not hard at all  Food Insecurity: No Food Insecurity (09/23/2023)   Hunger Vital Sign    Worried About Running Out of Food in the Last Year: Never true    Ran Out of Food in the Last Year: Never true  Transportation Needs: No Transportation Needs (09/23/2023)   PRAPARE - Administrator, Civil Service (Medical): No    Lack of Transportation (Non-Medical): No  Physical Activity: Insufficiently Active (09/23/2023)   Exercise Vital Sign    Days of Exercise per Week: 2 days    Minutes of Exercise per Session: 60 min  Stress: No Stress Concern Present (09/23/2023)   [employer] of Occupational Health - Occupational Stress Questionnaire    Feeling of Stress : Not at all  Social Connections: Moderately Integrated (09/23/2023)   Social Connection and  Isolation Panel    Frequency of Communication with Friends and Family: More than three times a week    Frequency of Social Gatherings with Friends and Family: More than three times a week    Attends Religious Services: Never    Database Administrator or Organizations: Yes    Attends Engineer, Structural: More than 4 times per year    Marital Status: Married  Catering Manager Violence: Not At Risk (09/23/2023)   Humiliation, Afraid, Rape, and Kick questionnaire    Fear of Current or Ex-Partner: No    Emotionally Abused: No    Physically Abused: No    Sexually Abused: No  Depression (PHQ2-9): Low Risk (09/23/2023)   Depression (PHQ2-9)    PHQ-2 Score: 0  Alcohol Screen: Low Risk (09/23/2023)   Alcohol Screen    Last Alcohol Screening Score (AUDIT): 0  Housing: Low Risk (09/23/2023)   Housing Stability Vital Sign    Unable to Pay for Housing in the Last Year: No    Number of Times Moved in the Last Year: 0    Homeless in the Last Year: No  Utilities: Not At Risk (09/23/2023)   AHC Utilities    Threatened with loss of utilities: No  Health Literacy: Adequate Health Literacy (09/23/2023)   B1300 Health Literacy    Frequency of need for help with medical instructions: Never    Family History  Problem Relation Age of Onset   Diabetes Mother    Heart disease Mother 1       Arrhythmia   Hypertension Mother    Heart disease Father 26       died 53 CHF   Heart attack Father    Heart disease Brother    Hyperlipidemia Other    Heart disease Other    Stroke Neg Hx    Colon cancer Neg Hx    Esophageal cancer Neg Hx    Liver disease Neg Hx    Pancreatic cancer Neg Hx  Stomach cancer Neg Hx     Review of Systems:  As stated in the HPI and otherwise negative.   BP 104/74   Pulse 75   Ht 6' 1 (1.854 m)   Wt 231 lb 3.2 oz (104.9 kg)   SpO2 95%   BMI 30.50 kg/m   Physical Examination: General: Well developed, well nourished, NAD  HEENT: OP clear, mucus membranes  moist  SKIN: warm, dry. No rashes. Neuro: No focal deficits  Musculoskeletal: Muscle strength 5/5 all ext  Psychiatric: Mood and affect normal  Neck: No JVD, no carotid bruits, no thyromegaly, no lymphadenopathy.  Lungs:Clear bilaterally, no wheezes, rhonci, crackles Cardiovascular: Regular rate and rhythm. No murmurs, gallops or rubs. Abdomen:Soft. Bowel sounds present. Non-tender.  Extremities: No lower extremity edema. Pulses are 2 + in the bilateral DP/PT.  EKG:  EKG is not ordered today. The ekg ordered today demonstrates   Recent Labs: 09/23/2023: ALT 24   Lipid Panel    Component Value Date/Time   CHOL 111 09/23/2023 0901   TRIG 159 (H) 09/23/2023 0901   HDL 36 (L) 09/23/2023 0901   CHOLHDL 3.1 09/23/2023 0901   CHOLHDL 4 03/24/2023 0922   VLDL 43.2 (H) 03/24/2023 0922   LDLCALC 48 09/23/2023 0901   LDLCALC  07/01/2020 1411     Comment:     . LDL cholesterol not calculated. Triglyceride levels greater than 400 mg/dL invalidate calculated LDL results. . Reference range: <100 . Desirable range <100 mg/dL for primary prevention;   <70 mg/dL for patients with CHD or diabetic patients  with > or = 2 CHD risk factors. SABRA LDL-C is now calculated using the Martin-Hopkins  calculation, which is a validated novel method providing  better accuracy than the Friedewald equation in the  estimation of LDL-C.  Gladis APPLETHWAITE et al. SANDREA. 7986;689(80): 2061-2068  (http://education.[facility].com/faq/FAQ164)    LDLDIRECT 76.0 03/24/2023 0922     Wt Readings from Last 3 Encounters:  08/10/24 231 lb 3.2 oz (104.9 kg)  07/11/24 233 lb (105.7 kg)  07/10/24 235 lb 3.2 oz (106.7 kg)    Assessment and Plan:   1. CAD without angina: Last cardiac cath in 2019 with moderate RCA stent restenosis and moderate LAD stenosis that was not felt to be flow limiting. Cardiac PET CT in July 2025 with no ischemia. Normal LV function by echo December 2025. His chest pain and dyspnea are  resolved since his asthma medications were adjusted.   -Continue Lipitor. He is not on an ASA since he is on Xarelto .    2. HTN: BP is controlled.  -Continue current meds  3. HLD: LDL 48 in January 2025.  -Continue Lipitor  4. Paroxysmal atrial fibrillation: Sinus on exam today.  -Continue Cardizem , sotalol  and Xarelto .   5. Mitral regurgitation: Mild by echo December 2025.   Labs/ tests ordered today include:  No orders of the defined types were placed in this encounter.  Disposition:   F/U with me in 12  months  Signed, Lonni Cash, MD, [facility] 08/10/2024 11:41 AM    [facility] [address] [place], [place], [place]  [address] Phone: [phone]; Fax: [phone]

## 2024-08-11 ENCOUNTER — Other Ambulatory Visit: Payer: Self-pay

## 2024-08-14 ENCOUNTER — Other Ambulatory Visit (HOSPITAL_BASED_OUTPATIENT_CLINIC_OR_DEPARTMENT_OTHER): Payer: Self-pay

## 2024-08-15 ENCOUNTER — Other Ambulatory Visit (HOSPITAL_BASED_OUTPATIENT_CLINIC_OR_DEPARTMENT_OTHER): Payer: Self-pay

## 2024-08-25 ENCOUNTER — Other Ambulatory Visit: Payer: Self-pay

## 2024-08-31 ENCOUNTER — Ambulatory Visit
Admission: RE | Admit: 2024-08-31 | Discharge: 2024-08-31 | Disposition: A | Discharge status: Home / Self Care | Source: Ambulatory Visit | Attending: Acute Care | Admitting: Acute Care

## 2024-08-31 DIAGNOSIS — Z87891 Personal history of nicotine dependence: Secondary | ICD-10-CM

## 2024-08-31 DIAGNOSIS — Z122 Encounter for screening for malignant neoplasm of respiratory organs: Secondary | ICD-10-CM

## 2024-09-08 ENCOUNTER — Other Ambulatory Visit (HOSPITAL_BASED_OUTPATIENT_CLINIC_OR_DEPARTMENT_OTHER): Payer: Self-pay

## 2024-09-08 ENCOUNTER — Other Ambulatory Visit: Payer: Self-pay

## 2024-09-08 ENCOUNTER — Ambulatory Visit: Admitting: Family Medicine

## 2024-09-08 ENCOUNTER — Encounter: Payer: Self-pay | Admitting: Family Medicine

## 2024-09-08 VITALS — BP 144/70 | HR 81 | Temp 98.2°F | Wt 235.0 lb

## 2024-09-08 DIAGNOSIS — J209 Acute bronchitis, unspecified: Secondary | ICD-10-CM | POA: Diagnosis not present

## 2024-09-08 DIAGNOSIS — Z122 Encounter for screening for malignant neoplasm of respiratory organs: Secondary | ICD-10-CM

## 2024-09-08 DIAGNOSIS — Z87891 Personal history of nicotine dependence: Secondary | ICD-10-CM

## 2024-09-08 MED ORDER — BENZONATATE 100 MG PO CAPS
100.0000 mg | ORAL_CAPSULE | Freq: Three times a day (TID) | ORAL | 0 refills | Status: DC | PRN
  Filled 2024-09-08: qty 30, 10d supply, fill #0

## 2024-09-08 NOTE — Progress Notes (Signed)
 "  Established Patient Office Visit  Subjective   Patient ID: Eric Palmer, male    DOB: 08/22/1956  Age: 69 y.o. MRN: [id number]  Chief Complaint  Patient presents with   Cough   Sinusitis   Wheezing   Tinnitus    HPI    Eric Palmer is seen with about 5-day history of sinus congestion, cough, intermittent mild wheeze.  Has taken several over-the-counter medications including Mucinex, Robitussin, and Tylenol .  Overall, feels better today.  No fever.  Cough interfering some with sleep.  He does have reported allergy to codeine.  Quit smoking 2020.  Past Medical History:  Diagnosis Date   Allergy    Anal fissure    Chronic bronchitis ([name])    get it q spring and fall (10/27/2013)   Coronary artery disease    a. s/p PCI in 1996. b. LHC 01/2006 showed 50% mid LAD, otherwise widely patent cors with minimal irregularities. c. Nuclear stress test in 04/2009 was reportedly low risk, no ischemia. // d. Nuclear stress test 2/19: EF 55, transient ST depression in recovery, no ischemia, inf defect (artifact vs scar)    DM type 2, uncontrolled, with neuropathy    ED (erectile dysfunction)    Essential hypertension    Hx of echocardiogram    Echo (10/2013): Mild LVH, moderate focal basal hypertrophy of the septum, EF 50-55%, no RWMA   Hyperlipidemia    Internal hemorrhoids without mention of complication    Obesity    Obesity    OSA on CPAP    Paresthesia of both legs    Paroxysmal atrial fibrillation ([name])    a. failed Tikosyn , Multaq . b. s/p ablation of AF/AFL in 12/2013.   Paroxysmal atrial flutter ([name])    a. s/p ablation of AF/AFL in 12/2013.   Sleep apnea    Smoker    QUIT 10/13/13   Tubular adenoma of colon 2016   Past Surgical History:  Procedure Laterality Date   ABLATION  01-12-2014   PVI and CTI by Dr Kelsie   ANAL FISSURE REPAIR  ~ 2010   ATRIAL FIBRILLATION ABLATION N/A 01/12/2014   Procedure: ATRIAL FIBRILLATION ABLATION;  Surgeon: Lynwood JONETTA Kelsie, MD;  Location: [facility] CATH LAB;   Service: Cardiovascular;  Laterality: N/A;   CARDIAC CATHETERIZATION  2007   CORONARY ANGIOPLASTY WITH STENT PLACEMENT  1996   residual 50% LAD and RCA by cath in 2007   CORONARY ANGIOPLASTY WITH STENT PLACEMENT  1995   INGUINAL HERNIA REPAIR Right 1963   LEFT HEART CATH AND CORONARY ANGIOGRAPHY N/A 10/11/2017   Procedure: LEFT HEART CATH AND CORONARY ANGIOGRAPHY;  Surgeon: Claudene Victory ORN, MD;  Location: [facility] INVASIVE CV LAB;  Service: Cardiovascular;  Laterality: N/A;   TEE WITHOUT CARDIOVERSION N/A 01/11/2014   Procedure: TRANSESOPHAGEAL ECHOCARDIOGRAM (TEE);  Surgeon: Aleene JINNY Passe, MD;  Location: [facility] ENDOSCOPY;  Service: Cardiovascular;  Laterality: N/A;   TONSILLECTOMY AND ADENOIDECTOMY  1979    reports that he quit smoking about 6 years ago. His smoking use included cigarettes. He started smoking about 49 years ago. He has a 43 pack-year smoking history. He has never used smokeless tobacco. He reports current alcohol use. He reports that he does not use drugs. family history includes Diabetes in his mother; Heart attack in his father; Heart disease in his brother and another family member; Heart disease (age of onset: 74) in his father; Heart disease (age of onset: 21) in his mother; Hyperlipidemia in an other family member; Hypertension  in his mother. Allergies[1] ' Review of Systems  Constitutional:  Negative for chills and fever.  HENT:  Negative for sinus pain.   Respiratory:  Positive for cough.   Cardiovascular:  Negative for chest pain.      Objective:     BP (!) 144/70   Pulse 81   Temp 98.2 F (36.8 C) (Oral)   Wt 235 lb (106.6 kg)   SpO2 96%   BMI 31.00 kg/m  BP Readings from Last 3 Encounters:  09/08/24 (!) 144/70  08/10/24 104/74  07/11/24 121/76   Wt Readings from Last 3 Encounters:  09/08/24 235 lb (106.6 kg)  08/10/24 231 lb 3.2 oz (104.9 kg)  07/11/24 233 lb (105.7 kg)      Physical Exam Vitals reviewed.  Constitutional:      General: He is not in  acute distress.    Appearance: He is not ill-appearing.  HENT:     Right Ear: Tympanic membrane normal.     Left Ear: Tympanic membrane normal.     Mouth/Throat:     Mouth: Mucous membranes are moist.     Pharynx: Oropharynx is clear. No oropharyngeal exudate or posterior oropharyngeal erythema.  Cardiovascular:     Rate and Rhythm: Normal rate and regular rhythm.  Pulmonary:     Effort: Pulmonary effort is normal.     Breath sounds: Normal breath sounds. No wheezing or rales.  Neurological:     Mental Status: He is alert.      No results found for any visits on 09/08/24.    The ASCVD Risk score (Arnett DK, et al., 2019) failed to calculate for the following reasons:   The valid total cholesterol range is 130 to 320 mg/dL    Assessment & Plan:   Cough.  Suspect acute viral bronchitis.  Nonfocal lung exam.  Recommend continue Mucinex.  Try Tessalon  Perles 100 mg every 8 hours as needed for cough.  Follow-up promptly for any fever or increased shortness of breath.  He does have allergy listed to codeine but it appears he may have been able to take Hycodan cough syrup in the past.  If cough persist consider short course of Hycodan  Wolm Scarlet, MD     [1]  Allergies Allergen Reactions   Cephalosporins Anaphylaxis   Penicillins Anaphylaxis and Other (See Comments)    Has patient had a PCN reaction causing immediate rash, facial/tongue/throat swelling, SOB or lightheadedness with hypotension: Yes Has patient had a PCN reaction causing severe rash involving mucus membranes or skin necrosis: No Has patient had a PCN reaction that required hospitalization: No - went to doctors office Has patient had a PCN reaction occurring within the last 10 years: No If all of the above answers are NO, then may proceed with Cephalosporin use.    Thallium Itching and Rash   Codeine Itching   Gabapentin  Other (See Comments)    made me feel drugged   Eliquis  [Apixaban ] Diarrhea   "

## 2024-09-12 ENCOUNTER — Ambulatory Visit: Admitting: Pulmonary Disease

## 2024-09-12 NOTE — Progress Notes (Signed)
 Eric Palmer                                          MRN: [id number]   09/12/2024   The [facility] Quality Team Specialist reviewed this patient medical record for the purposes of chart review for care gap closure. The following were reviewed: chart review for care gap closure-kidney health evaluation for diabetes:eGFR  and uACR.    [facility]I Quality Team

## 2024-09-13 ENCOUNTER — Other Ambulatory Visit (HOSPITAL_BASED_OUTPATIENT_CLINIC_OR_DEPARTMENT_OTHER): Payer: Self-pay

## 2024-09-14 ENCOUNTER — Other Ambulatory Visit (HOSPITAL_BASED_OUTPATIENT_CLINIC_OR_DEPARTMENT_OTHER): Payer: Self-pay

## 2024-09-19 ENCOUNTER — Other Ambulatory Visit: Payer: Self-pay

## 2024-09-19 ENCOUNTER — Emergency Department (HOSPITAL_BASED_OUTPATIENT_CLINIC_OR_DEPARTMENT_OTHER): Admitting: Radiology

## 2024-09-19 ENCOUNTER — Encounter (HOSPITAL_BASED_OUTPATIENT_CLINIC_OR_DEPARTMENT_OTHER): Payer: Self-pay

## 2024-09-19 DIAGNOSIS — Z7901 Long term (current) use of anticoagulants: Secondary | ICD-10-CM | POA: Diagnosis not present

## 2024-09-19 DIAGNOSIS — S3011XA Contusion of abdominal wall, initial encounter: Secondary | ICD-10-CM | POA: Diagnosis not present

## 2024-09-19 DIAGNOSIS — S0990XA Unspecified injury of head, initial encounter: Secondary | ICD-10-CM | POA: Insufficient documentation

## 2024-09-19 DIAGNOSIS — I482 Chronic atrial fibrillation, unspecified: Secondary | ICD-10-CM | POA: Diagnosis not present

## 2024-09-19 DIAGNOSIS — I2510 Atherosclerotic heart disease of native coronary artery without angina pectoris: Secondary | ICD-10-CM | POA: Insufficient documentation

## 2024-09-19 DIAGNOSIS — S20219A Contusion of unspecified front wall of thorax, initial encounter: Secondary | ICD-10-CM | POA: Diagnosis not present

## 2024-09-19 DIAGNOSIS — S3991XA Unspecified injury of abdomen, initial encounter: Secondary | ICD-10-CM | POA: Diagnosis present

## 2024-09-19 DIAGNOSIS — W000XXA Fall on same level due to ice and snow, initial encounter: Secondary | ICD-10-CM | POA: Insufficient documentation

## 2024-09-19 DIAGNOSIS — I6782 Cerebral ischemia: Secondary | ICD-10-CM | POA: Insufficient documentation

## 2024-09-19 NOTE — ED Triage Notes (Signed)
 Pt slipped and fell in the ice, landed flat on his back and now it hurts to breath.

## 2024-09-20 ENCOUNTER — Emergency Department (HOSPITAL_BASED_OUTPATIENT_CLINIC_OR_DEPARTMENT_OTHER)
Admission: EM | Admit: 2024-09-20 | Discharge: 2024-09-20 | Disposition: A | Discharge status: Home / Self Care | Attending: Emergency Medicine | Admitting: Emergency Medicine

## 2024-09-20 ENCOUNTER — Other Ambulatory Visit (HOSPITAL_BASED_OUTPATIENT_CLINIC_OR_DEPARTMENT_OTHER): Payer: Self-pay

## 2024-09-20 ENCOUNTER — Emergency Department (HOSPITAL_BASED_OUTPATIENT_CLINIC_OR_DEPARTMENT_OTHER)

## 2024-09-20 ENCOUNTER — Other Ambulatory Visit: Payer: Self-pay

## 2024-09-20 DIAGNOSIS — S0990XA Unspecified injury of head, initial encounter: Secondary | ICD-10-CM

## 2024-09-20 DIAGNOSIS — Z7901 Long term (current) use of anticoagulants: Secondary | ICD-10-CM

## 2024-09-20 DIAGNOSIS — S3011XA Contusion of abdominal wall, initial encounter: Secondary | ICD-10-CM

## 2024-09-20 DIAGNOSIS — S20219A Contusion of unspecified front wall of thorax, initial encounter: Secondary | ICD-10-CM

## 2024-09-20 DIAGNOSIS — W19XXXA Unspecified fall, initial encounter: Secondary | ICD-10-CM

## 2024-09-20 LAB — CBC WITH DIFFERENTIAL/PLATELET
Abs Immature Granulocytes: 0.06 10*3/uL (ref 0.00–0.07)
Basophils Absolute: 0.1 10*3/uL (ref 0.0–0.1)
Basophils Relative: 1 %
Eosinophils Absolute: 0.3 10*3/uL (ref 0.0–0.5)
Eosinophils Relative: 3 %
HCT: 44.3 % (ref 39.0–52.0)
Hemoglobin: 15.6 g/dL (ref 13.0–17.0)
Immature Granulocytes: 1 %
Lymphocytes Relative: 24 %
Lymphs Abs: 2.2 10*3/uL (ref 0.7–4.0)
MCH: 31.2 pg (ref 26.0–34.0)
MCHC: 35.2 g/dL (ref 30.0–36.0)
MCV: 88.6 fL (ref 80.0–100.0)
Monocytes Absolute: 0.7 10*3/uL (ref 0.1–1.0)
Monocytes Relative: 8 %
Neutro Abs: 5.9 10*3/uL (ref 1.7–7.7)
Neutrophils Relative %: 63 %
Platelets: 238 10*3/uL (ref 150–400)
RBC: 5 MIL/uL (ref 4.22–5.81)
RDW: 12.9 % (ref 11.5–15.5)
WBC: 9.2 10*3/uL (ref 4.0–10.5)
nRBC: 0 % (ref 0.0–0.2)

## 2024-09-20 LAB — COMPREHENSIVE METABOLIC PANEL WITH GFR
ALT: 28 U/L (ref 0–44)
AST: 22 U/L (ref 15–41)
Albumin: 4.1 g/dL (ref 3.5–5.0)
Alkaline Phosphatase: 68 U/L (ref 38–126)
Anion gap: 16 — ABNORMAL HIGH (ref 5–15)
BUN: 19 mg/dL (ref 8–23)
CO2: 21 mmol/L — ABNORMAL LOW (ref 22–32)
Calcium: 9.6 mg/dL (ref 8.9–10.3)
Chloride: 100 mmol/L (ref 98–111)
Creatinine, Ser: 0.95 mg/dL (ref 0.61–1.24)
GFR, Estimated: >60 mL/min
Glucose, Bld: 260 mg/dL — ABNORMAL HIGH (ref 70–99)
Potassium: 4.3 mmol/L (ref 3.5–5.1)
Sodium: 137 mmol/L (ref 135–145)
Total Bilirubin: 0.3 mg/dL (ref 0.0–1.2)
Total Protein: 6.9 g/dL (ref 6.5–8.1)

## 2024-09-20 LAB — LIPASE, BLOOD: Lipase: 33 U/L (ref 11–51)

## 2024-09-20 LAB — TROPONIN T, HIGH SENSITIVITY: Troponin T High Sensitivity: 7 ng/L (ref 0–19)

## 2024-09-20 MED ORDER — OXYCODONE-ACETAMINOPHEN 5-325 MG PO TABS
1.0000 | ORAL_TABLET | Freq: Four times a day (QID) | ORAL | 0 refills | Status: AC | PRN
  Filled 2024-09-20: qty 20, 3d supply, fill #0

## 2024-09-20 MED ORDER — MORPHINE SULFATE (PF) 4 MG/ML IV SOLN
4.0000 mg | Freq: Once | INTRAVENOUS | Status: AC
  Administered 2024-09-20: 4 mg via INTRAVENOUS
  Filled 2024-09-20: qty 1

## 2024-09-20 MED ORDER — ONDANSETRON HCL 4 MG/2ML IJ SOLN
4.0000 mg | Freq: Once | INTRAMUSCULAR | Status: AC
  Administered 2024-09-20: 4 mg via INTRAVENOUS
  Filled 2024-09-20: qty 2

## 2024-09-20 MED ORDER — IOHEXOL 300 MG/ML  SOLN
100.0000 mL | Freq: Once | INTRAMUSCULAR | Status: AC | PRN
  Administered 2024-09-20: 100 mL via INTRAVENOUS

## 2024-09-20 MED ORDER — SODIUM CHLORIDE 0.9 % IV BOLUS
1000.0000 mL | Freq: Once | INTRAVENOUS | Status: AC
  Administered 2024-09-20: 1000 mL via INTRAVENOUS

## 2024-09-20 NOTE — ED Provider Notes (Signed)
 " [facility] Provider Note   CSN: [id number] Arrival date & time: 09/19/24  2331     Patient presents with: Eric Palmer is a 69 y.o. male.   Patient is a 69 year old male with history of atrial fibrillation on Xarelto , hyperlipidemia, coronary artery disease.  Patient presenting today with complaints of injury sustained in a fall.  He was helping his son with his car that was stuck in the snow when he slipped on the ice, fell backward, and struck the back of his chest and head.  He is describing discomfort in his upper abdomen, chest, and head.  He denies any loss of consciousness, but does report having the wind knocked out of him.  He describes pain in his chest when he attempts to breathe.  No alleviating factors.       Prior to Admission medications  Medication Sig Start Date End Date Taking? Authorizing Provider  albuterol  (VENTOLIN  HFA) 108 (90 Base) MCG/ACT inhaler Inhale 2 puffs into the lungs every 4-6 hours as needed for cough/wheeze. 06/20/24   Burchette, Wolm ORN, MD  atorvastatin  (LIPITOR) 40 MG tablet Take 1 tablet (40 mg total) by mouth at bedtime. 12/13/23   Dick, Ernest H Jr., NP  azelastine  (ASTELIN ) 0.1 % nasal spray Place 2 sprays into both nostrils 2 (two) times daily. Use in each nostril as directed 07/26/24   Burchette, Wolm ORN, MD  benzonatate  (TESSALON ) 100 MG capsule Take 1 capsule (100 mg total) by mouth 3 (three) times daily as needed for cough. 09/08/24   Burchette, Wolm ORN, MD  diltiazem  (CARDIZEM  CD) 360 MG 24 hr capsule Take 1 capsule (360 mg total) by mouth daily. 12/13/23   Wyn Jackee VEAR Mickey., NP  empagliflozin  (JARDIANCE ) 10 MG TABS tablet Take 1 tablet (10 mg total) by mouth daily before breakfast. 03/14/24   Verlin Lonni BIRCH, MD  Fluticasone -Umeclidin-Vilant (TRELEGY ELLIPTA ) 200-62.5-25 MCG/ACT AEPB Inhale 1 puff into the lungs daily. 07/11/24   Hunsucker, Donnice SAUNDERS, MD  icosapent  Ethyl  (VASCEPA ) 1 g capsule Take 2 capsules (2 g total) by mouth 2 (two) times daily. 12/13/23   Wyn Jackee VEAR Mickey., NP  metFORMIN  (GLUCOPHAGE -XR) 750 MG 24 hr tablet Take 1 tablet (750 mg total) by mouth 2 (two) times daily. 06/12/24   Burchette, Wolm ORN, MD  nitroGLYCERIN  (NITROSTAT ) 0.4 MG SL tablet DISSOLVE 1 TABLET UNDER THE TONGUE EVERY 5 MINUTES FOR 3 DOSES AS NEEDED FOR CHEST PAIN(CALL 911 IF 3RD DOSE IS TAKEN) 12/13/23   Wyn Jackee VEAR Mickey., NP  ondansetron  (ZOFRAN ) 8 MG tablet Take 1 tablet (8 mg total) by mouth every 8 (eight) hours as needed for nausea or vomiting. 06/12/24   Burchette, Wolm ORN, MD  ONETOUCH DELICA LANCETS FINE MISC USE TO CHECK BLOOD SUGAR ONCE DAILY 03/19/17   Burchette, Wolm ORN, MD  rivaroxaban  (XARELTO ) 20 MG TABS tablet Take 1 tablet (20 mg total) by mouth daily with supper. 12/13/23   Wyn Jackee VEAR Mickey., NP  sotalol  (BETAPACE ) 80 MG tablet Take 1 tablet (80 mg total) by mouth 2 (two) times daily. 12/13/23   Wyn Jackee VEAR Mickey., NP  tadalafil  (CIALIS ) 20 MG tablet Take 1 tablet (20 mg total) by mouth daily as needed for erectile dysfunction. 12/13/23   Wyn Jackee VEAR Mickey., NP    Allergies: Cephalosporins, Penicillins, Thallium, Codeine, Gabapentin , and Eliquis  [apixaban ]    Review of Systems  All other systems reviewed and are negative.  Updated Vital Signs BP (!) 166/82 (BP Location: Right Arm)   Pulse 85   Temp 98 F (36.7 C)   Resp 17   Ht 6' 1 (1.854 m)   Wt 106.1 kg   SpO2 98%   BMI 30.87 kg/m   Physical Exam Vitals and nursing note reviewed.  Constitutional:      General: He is not in acute distress.    Appearance: He is well-developed. He is not diaphoretic.  HENT:     Head: Normocephalic and atraumatic.  Neck:     Comments: There is no cervical spine tenderness or step-off.  He has painless range of motion in all directions. Cardiovascular:     Rate and Rhythm: Normal rate and regular rhythm.     Heart sounds: No murmur heard.    No friction rub.   Pulmonary:     Effort: Pulmonary effort is normal. No respiratory distress.     Breath sounds: Normal breath sounds. No wheezing or rales.  Abdominal:     General: Bowel sounds are normal. There is no distension.     Palpations: Abdomen is soft.     Tenderness: There is abdominal tenderness. There is no guarding or rebound.     Comments: There is tenderness to palpation across the upper abdomen.  Musculoskeletal:        General: Normal range of motion.     Cervical back: Normal range of motion and neck supple.     Comments: There is no tenderness in the tissues of the lumbar and thoracic region.  Skin:    General: Skin is warm and dry.  Neurological:     General: No focal deficit present.     Mental Status: He is alert and oriented to person, place, and time.     Cranial Nerves: No cranial nerve deficit.     Coordination: Coordination normal.     (all labs ordered are listed, but only abnormal results are displayed) Labs Reviewed - No data to display  EKG: EKG Interpretation Date/Time:  Wednesday September 20 2024 02:06:25 EST Ventricular Rate:  74 PR Interval:  184 QRS Duration:  114 QT Interval:  394 QTC Calculation: 438 R Axis:   76  Text Interpretation: Sinus rhythm Borderline intraventricular conduction delay Abnormal inferior Q waves No significant change since 07/10/2024 Confirmed by Geroldine Berg ([id number]) on 09/20/2024 3:35:41 AM  Radiology: [facility] Chest 2 View Result Date: 09/19/2024 CLINICAL DATA:  Fall EXAM: CHEST - 2 VIEW COMPARISON:  CT 08/31/2024 FINDINGS: No focal opacity, pleural effusion or pneumothorax. Atelectasis or scarring at left base. Normal cardiac size. IMPRESSION: No active cardiopulmonary disease. Electronically Signed   By: Luke Bun M.D.   On: 09/19/2024 23:56     Procedures   Medications Ordered in the ED  sodium chloride  0.9 % bolus 1,000 mL (has no administration in time range)  ondansetron  (ZOFRAN ) injection 4 mg (has no administration in  time range)  morphine  (PF) 4 MG/ML injection 4 mg (has no administration in time range)                                    Medical Decision Making Amount and/or Complexity of Data Reviewed Labs: ordered. Radiology: ordered.  Risk Prescription drug management.   Patient is a 69 year old male on Xarelto  presenting after a fall.  He slipped on ice and injured his upper back and struck his head.  Patient arrives here with stable vital signs and is afebrile.  Neurologic exam is nonfocal.    Laboratory studies obtained including CBC, CMP, lipase, and troponin, all of which are unremarkable.  Chest x-ray shows no acute process.  I did obtain a CT scan of the chest, abdomen, and pelvis along with a CT scan of his head given the fact he is on a blood thinner.  These were all unremarkable and showed no evidence for acute traumatic injury.  Patient has been given morphine  for pain and Zofran  for nausea.  At this point, I feel as though I have excluded serious pathology and feel as though patient can safely be discharged.  I will prescribe pain medication and have him follow-up with primary doctor if symptoms persist.     Final diagnoses:  None    ED Discharge Orders     None          Geroldine Berg, MD 09/20/24 [phone]  "

## 2024-09-20 NOTE — ED Notes (Signed)
 Pt educated to not drive or operate heavy machinery after receiving narcotic medication due to potential sedation and impaired judgement. Patient verbalized understanding and agreed to contact his wife for transpiration home.

## 2024-09-20 NOTE — Discharge Instructions (Signed)
 Begin taking Percocet as prescribed as needed for pain.  Rest.  Follow-up with primary doctor if symptoms persist, and return to the ER if symptoms significantly worsen or change.

## 2024-09-22 ENCOUNTER — Other Ambulatory Visit (HOSPITAL_BASED_OUTPATIENT_CLINIC_OR_DEPARTMENT_OTHER): Payer: Self-pay

## 2024-09-27 ENCOUNTER — Encounter: Payer: Self-pay | Admitting: Family Medicine

## 2024-09-27 ENCOUNTER — Ambulatory Visit: Admitting: Family Medicine

## 2024-09-27 ENCOUNTER — Other Ambulatory Visit (HOSPITAL_BASED_OUTPATIENT_CLINIC_OR_DEPARTMENT_OTHER): Payer: Self-pay

## 2024-09-27 VITALS — BP 112/66 | HR 74 | Temp 97.5°F | Wt 236.0 lb

## 2024-09-27 DIAGNOSIS — S0990XD Unspecified injury of head, subsequent encounter: Secondary | ICD-10-CM

## 2024-09-27 DIAGNOSIS — R0789 Other chest pain: Secondary | ICD-10-CM

## 2024-09-27 MED ORDER — BENZONATATE 100 MG PO CAPS
100.0000 mg | ORAL_CAPSULE | Freq: Three times a day (TID) | ORAL | 1 refills | Status: AC | PRN
  Filled 2024-09-27: qty 30, 10d supply, fill #0

## 2024-09-27 MED ORDER — METHOCARBAMOL 500 MG PO TABS
500.0000 mg | ORAL_TABLET | Freq: Three times a day (TID) | ORAL | 0 refills | Status: AC | PRN
  Filled 2024-09-27: qty 30, 10d supply, fill #0

## 2024-09-27 NOTE — Progress Notes (Signed)
 "  Established Patient Office Visit  Subjective   Patient ID: Eric Palmer, male    DOB: [birth year]  Age: 69 y.o. MRN: [id number]  Chief Complaint  Patient presents with   Fall   Abdominal Pain   Shortness of Breath    HPI    [name] has history of atrial fibrillation, CAD, hypertension, type 2 diabetes, and hyperlipidemia.  He is seen for ER follow-up.  He was helping his son after recent ice event to get his truck unstuck.  As he was holding onto the tailgate after the truck became dislodged he let go and promptly fell backwards.  He was unable to catch himself and struck his back and head against the ground.  There was no loss of consciousness.  He had no confusion afterwards.  Denied any headache.  He noted that he had some immediate bilateral chest wall tenderness and he states that with the fall he had his breath knocked out of him.  He was driving back toward home when he noticed some bilateral chest wall pain and stopped at the emergency room at [facility] for further evaluation.  He had labs there including CBC, CMP, lipase, troponin which were all basically normal.  He had extensive imaging including chest x-ray, CT head, and CT chest, abdomen, and pelvis all without any acute evidence for trauma.  He was given some morphine  and prescribed oral Percocet which has not really helped much with his pain.  He is still having to sleep in a recliner.  He has some pain and dyspnea when he lies flat.  He feels like his muscles are tensing up.  He has pain with twisting or changing positions.  Also pain is triggered with cough.  Has had occasional dry cough and has used Tessalon  in the past which has worked well.  Cannot take nonsteroidals because of his chronic Xarelto .  Denied any extremity injuries with recent fall.  No persistent headaches.  No confusion.  Past Medical History:  Diagnosis Date   Allergy    Anal fissure    Chronic bronchitis ([name])    get it q spring and fall  (10/27/2013)   Coronary artery disease    a. s/p PCI in 1996. b. LHC 01/2006 showed 50% mid LAD, otherwise widely patent cors with minimal irregularities. c. Nuclear stress test in 04/2009 was reportedly low risk, no ischemia. // d. Nuclear stress test 2/19: EF 55, transient ST depression in recovery, no ischemia, inf defect (artifact vs scar)    DM type 2, uncontrolled, with neuropathy    ED (erectile dysfunction)    Essential hypertension    Hx of echocardiogram    Echo (10/2013): Mild LVH, moderate focal basal hypertrophy of the septum, EF 50-55%, no RWMA   Hyperlipidemia    Internal hemorrhoids without mention of complication    Obesity    Obesity    OSA on CPAP    Paresthesia of both legs    Paroxysmal atrial fibrillation ([name])    a. failed Tikosyn , Multaq . b. s/p ablation of AF/AFL in 12/2013.   Paroxysmal atrial flutter ([name])    a. s/p ablation of AF/AFL in 12/2013.   Sleep apnea    Smoker    QUIT 10/13/13   Tubular adenoma of colon 2016   Past Surgical History:  Procedure Laterality Date   ABLATION  01-12-2014   PVI and CTI by Dr Kelsie   ANAL FISSURE REPAIR  ~ 2010   ATRIAL FIBRILLATION ABLATION N/A 01/12/2014  Procedure: ATRIAL FIBRILLATION ABLATION;  Surgeon: Lynwood JONETTA Rakers, MD;  Location: [facility] CATH LAB;  Service: Cardiovascular;  Laterality: N/A;   CARDIAC CATHETERIZATION  2007   CORONARY ANGIOPLASTY WITH STENT PLACEMENT  1996   residual 50% LAD and RCA by cath in 2007   CORONARY ANGIOPLASTY WITH STENT PLACEMENT  1995   INGUINAL HERNIA REPAIR Right 1963   LEFT HEART CATH AND CORONARY ANGIOGRAPHY N/A 10/11/2017   Procedure: LEFT HEART CATH AND CORONARY ANGIOGRAPHY;  Surgeon: Claudene Victory ORN, MD;  Location: [facility] INVASIVE CV LAB;  Service: Cardiovascular;  Laterality: N/A;   TEE WITHOUT CARDIOVERSION N/A 01/11/2014   Procedure: TRANSESOPHAGEAL ECHOCARDIOGRAM (TEE);  Surgeon: Aleene JINNY Passe, MD;  Location: [facility] ENDOSCOPY;  Service: Cardiovascular;  Laterality: N/A;   TONSILLECTOMY AND  ADENOIDECTOMY  1979    reports that he quit smoking about 6 years ago. His smoking use included cigarettes. He started smoking about 49 years ago. He has a 43 pack-year smoking history. He has never used smokeless tobacco. He reports current alcohol use. He reports that he does not use drugs. family history includes Diabetes in his mother; Heart attack in his father; Heart disease in his brother and another family member; Heart disease (age of onset: 26) in his father; Heart disease (age of onset: 44) in his mother; Hyperlipidemia in an other family member; Hypertension in his mother. Allergies[1]  Review of Systems  Constitutional:  Negative for chills, fever and weight loss.  Eyes:  Negative for blurred vision.  Respiratory:  Negative for hemoptysis and wheezing.   Cardiovascular:  Negative for chest pain and leg swelling.  Neurological:  Negative for dizziness, focal weakness, seizures, loss of consciousness and headaches.      Objective:     BP 112/66   Pulse 74   Temp (!) 97.5 F (36.4 C) (Oral)   Wt 236 lb (107 kg)   SpO2 94%   BMI 31.14 kg/m  BP Readings from Last 3 Encounters:  09/27/24 112/66  09/20/24 123/74  09/08/24 (!) 144/70   Wt Readings from Last 3 Encounters:  09/27/24 236 lb (107 kg)  09/19/24 234 lb (106.1 kg)  09/08/24 235 lb (106.6 kg)      Physical Exam Vitals reviewed.  Constitutional:      General: He is not in acute distress.    Appearance: He is not ill-appearing.  HENT:     Head: Normocephalic and atraumatic.  Cardiovascular:     Rate and Rhythm: Normal rate and regular rhythm.  Pulmonary:     Effort: Pulmonary effort is normal.     Breath sounds: Normal breath sounds. No wheezing or rales.  Abdominal:     Comments: He had difficulty lying supine on table for abdominal exam.  Neurological:     General: No focal deficit present.     Mental Status: He is alert.      No results found for any visits on 09/27/24.  Last CBC Lab  Results  Component Value Date   WBC 9.2 09/20/2024   HGB 15.6 09/20/2024   HCT 44.3 09/20/2024   MCV 88.6 09/20/2024   MCH 31.2 09/20/2024   RDW 12.9 09/20/2024   PLT 238 09/20/2024   Last metabolic panel Lab Results  Component Value Date   GLUCOSE 260 (H) 09/20/2024   NA 137 09/20/2024   K 4.3 09/20/2024   CL 100 09/20/2024   CO2 21 (L) 09/20/2024   BUN 19 09/20/2024   CREATININE 0.95 09/20/2024   GFRNONAA >  60 09/20/2024   CALCIUM  9.6 09/20/2024   PROT 6.9 09/20/2024   ALBUMIN 4.1 09/20/2024   BILITOT 0.3 09/20/2024   ALKPHOS 68 09/20/2024   AST 22 09/20/2024   ALT 28 09/20/2024   ANIONGAP 16 (H) 09/20/2024      The ASCVD Risk score (Arnett DK, et al., 2019) failed to calculate for the following reasons:   The valid total cholesterol range is 130 to 320 mg/dL    Assessment & Plan:   Recent fall with closed head injury and now having persistent bilateral chest wall pain.  Worse with reclined position and positional change.  Patient on chronic Xarelto .  Cannot take nonsteroidals.  Saw minimal relief with Percocet.  Has been using some plain Tylenol .  Nonfocal exam.  Lung exam is clear.  -Refill Tessalon  Perles to use 100 mg every 8 hours as needed for cough suppression - Continue Tylenol  as needed for discomfort and pain but do not exceed 1000 mg every 6-8 hours - Wrote for limited Robaxin  500 mg every 8 hours as needed for muscle spasm but caution for sedation and potential dizziness with that - Touch base if symptoms not improving the next couple weeks  Wolm Scarlet, MD     [1]  Allergies Allergen Reactions   Cephalosporins Anaphylaxis   Penicillins Anaphylaxis and Other (See Comments)    Has patient had a PCN reaction causing immediate rash, facial/tongue/throat swelling, SOB or lightheadedness with hypotension: Yes Has patient had a PCN reaction causing severe rash involving mucus membranes or skin necrosis: No Has patient had a PCN reaction that required  hospitalization: No - went to doctors office Has patient had a PCN reaction occurring within the last 10 years: No If all of the above answers are NO, then may proceed with Cephalosporin use.    Thallium Itching and Rash   Codeine Itching   Gabapentin  Other (See Comments)    made me feel drugged   Eliquis  [Apixaban ] Diarrhea   "

## 2024-09-27 NOTE — Patient Instructions (Signed)
 Let me know if not improving in the next couple of weeks.

## 2024-10-05 ENCOUNTER — Ambulatory Visit: Admitting: Family Medicine

## 2024-10-10 ENCOUNTER — Ambulatory Visit: Admitting: Family Medicine
